# Patient Record
Sex: Male | Born: 1995
Health system: Southern US, Community
[De-identification: ages and names within clinical notes are randomized; demographics above are authoritative.]

---
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
## 2014-05-26 ENCOUNTER — Ambulatory Visit: Payer: Self-pay | Attending: Pediatrics

## 2014-06-05 ENCOUNTER — Ambulatory Visit: Payer: Medicaid Other | Admitting: Internal Medicine

## 2014-06-08 ENCOUNTER — Ambulatory Visit: Payer: Self-pay | Attending: Internal Medicine | Admitting: Internal Medicine

## 2014-06-08 ENCOUNTER — Encounter: Payer: Self-pay | Admitting: Internal Medicine

## 2014-06-08 VITALS — BP 106/67 | HR 76 | Temp 98.1°F | Resp 16 | Wt 150.0 lb

## 2014-06-08 DIAGNOSIS — E1065 Type 1 diabetes mellitus with hyperglycemia: Secondary | ICD-10-CM | POA: Insufficient documentation

## 2014-06-08 DIAGNOSIS — E089 Diabetes mellitus due to underlying condition without complications: Secondary | ICD-10-CM

## 2014-06-08 LAB — GLUCOSE, POCT (MANUAL RESULT ENTRY)
POC GLUCOSE: 347 mg/dL — AB (ref 70–99)
POC Glucose: 270 mg/dl — AB (ref 70–99)

## 2014-06-08 LAB — POCT GLYCOSYLATED HEMOGLOBIN (HGB A1C): Hemoglobin A1C: 12.1

## 2014-06-08 MED ORDER — INSULIN ASPART 100 UNIT/ML ~~LOC~~ SOLN
10.0000 [IU] | Freq: Once | SUBCUTANEOUS | Status: AC
  Administered 2014-06-08: 10 [IU] via SUBCUTANEOUS

## 2014-06-08 NOTE — Progress Notes (Signed)
Patient here to establish care Diagnosed with DM at the age of [age] Currently only on insulin

## 2014-06-08 NOTE — Patient Instructions (Signed)
Diabetes Mellitus and Food It is important for you to manage your blood sugar (glucose) level. Your blood glucose level can be greatly affected by what you eat. Eating healthier foods in the appropriate amounts throughout the day at about the same time each day will help you control your blood glucose level. It can also help slow or prevent worsening of your diabetes mellitus. Healthy eating may even help you improve the level of your blood pressure and reach or maintain a healthy weight.  HOW CAN FOOD AFFECT ME? Carbohydrates Carbohydrates affect your blood glucose level more than any other type of food. Your dietitian will help you determine how many carbohydrates to eat at each meal and teach you how to count carbohydrates. Counting carbohydrates is important to keep your blood glucose at a healthy level, especially if you are using insulin or taking certain medicines for diabetes mellitus. Alcohol Alcohol can cause sudden decreases in blood glucose (hypoglycemia), especially if you use insulin or take certain medicines for diabetes mellitus. Hypoglycemia can be a life-threatening condition. Symptoms of hypoglycemia (sleepiness, dizziness, and disorientation) are similar to symptoms of having too much alcohol.  If your health care provider has given you approval to drink alcohol, do so in moderation and use the following guidelines:  Women should not have more than one drink per day, and men should not have more than two drinks per day. One drink is equal to:  12 oz of beer.  5 oz of wine.  1 oz of hard liquor.  Do not drink on an empty stomach.  Keep yourself hydrated. Have water, diet soda, or unsweetened iced tea.  Regular soda, juice, and other mixers might contain a lot of carbohydrates and should be counted. WHAT FOODS ARE NOT RECOMMENDED? As you make food choices, it is important to remember that all foods are not the same. Some foods have fewer nutrients per serving than other  foods, even though they might have the same number of calories or carbohydrates. It is difficult to get your body what it needs when you eat foods with fewer nutrients. Examples of foods that you should avoid that are high in calories and carbohydrates but low in nutrients include:  Trans fats (most processed foods list trans fats on the Nutrition Facts label).  Regular soda.  Juice.  Candy.  Sweets, such as cake, pie, doughnuts, and cookies.  Fried foods. WHAT FOODS CAN I EAT? Have nutrient-rich foods, which will nourish your body and keep you healthy. The food you should eat also will depend on several factors, including:  The calories you need.  The medicines you take.  Your weight.  Your blood glucose level.  Your blood pressure level.  Your cholesterol level. You also should eat a variety of foods, including:  Protein, such as meat, poultry, fish, tofu, nuts, and seeds (lean animal proteins are best).  Fruits.  Vegetables.  Dairy products, such as milk, cheese, and yogurt (low fat is best).  Breads, grains, pasta, cereal, rice, and beans.  Fats such as olive oil, trans fat-free margarine, canola oil, avocado, and olives. DOES EVERYONE WITH DIABETES MELLITUS HAVE THE SAME MEAL PLAN? Because every person with diabetes mellitus is different, there is not one meal plan that works for everyone. It is very important that you meet with a dietitian who will help you create a meal plan that is just right for you. Document Released: 05/15/2005 Document Revised: 08/23/2013 Document Reviewed: 07/15/2013 [employer] Patient Information 2015 [employer]. This   information is not intended to replace advice given to you by your health care provider. Make sure you discuss any questions you have with your health care provider.  

## 2014-06-08 NOTE — Progress Notes (Signed)
Patient Demographics  Joseph EpleyMatthew Barr, is a 18 y.o. male  ZOX:[id number]  WJX:[id number]  DOB - 04/21/1996  CC:  Chief Complaint  Patient presents with  . Establish Care       HPI: Joseph EpleyMatthew Barr is a 18 y.o. male here today to establish medical care. Patient has history of 1 diabetes, as per patient when he was 18 years old he was diagnosed with diabetesient currently is taking Lantus 44 units as well as Humalog 3 times a day with meals sliding scale, as per patient he was following up with the endocrinologist in the past, his diabetes has been uncontrolled today his hemoglobin A1c still 0.1%, as per patient his fasting sugar is usually more than 200, as per patient he ate today and took his insulin still his blood sugar is elevated in the office today he is given 10 units of NovoLog, patient denies any headache dizziness chest and shortness of breath, denies any nausea vomiting any abdominal pain, as per patient he has already saw an ophthalmologist 3 months ago.  No headache, No chest pain, No abdominal pain - No Nausea, No new weakness tingling or numbness, No Cough - SOB.  No Known Allergies Past Medical History  Diagnosis Date  . Diabetes mellitus    Current Outpatient Prescriptions on File Prior to Visit  Medication Sig Dispense Refill  . insulin glargine (LANTUS) 100 UNIT/ML injection Inject 44 Units into the skin at bedtime.       . insulin lispro (HUMALOG) 100 UNIT/ML injection Inject 0-20 Units into the skin 3 (three) times daily before meals. Based on sliding scale chart       No current facility-administered medications on file prior to visit.   Family History  Problem Relation Age of Onset  . Cancer Maternal Grandfather   . Hypertension Mother    History   Social History  . Marital Status: Single    Spouse Name: N/A    Number of Children: N/A  . Years of Education: N/A   Occupational History  . Not on file.   Social History Main Topics  . Smoking status:  Never Smoker   . Smokeless tobacco: Not on file  . Alcohol Use: No  . Drug Use: Not on file  . Sexual Activity: Not on file   Other Topics Concern  . Not on file   Social History Narrative  . No narrative on file    Review of Systems: Constitutional: Negative for fever, chills, diaphoresis, activity change, appetite change and fatigue. HENT: Negative for ear pain, nosebleeds, congestion, facial swelling, rhinorrhea, neck pain, neck stiffness and ear discharge.  Eyes: Negative for pain, discharge, redness, itching and visual disturbance. Respiratory: Negative for cough, choking, chest tightness, shortness of breath, wheezing and stridor.  Cardiovascular: Negative for chest pain, palpitations and leg swelling. Gastrointestinal: Negative for abdominal distention. Genitourinary: Negative for dysuria, urgency, frequency, hematuria, flank pain, decreased urine volume, difficulty urinating and dyspareunia.  Musculoskeletal: Negative for back pain, joint swelling, arthralgia and gait problem. Neurological: Negative for dizziness, tremors, seizures, syncope, facial asymmetry, speech difficulty, weakness, light-headedness, numbness and headaches.  Hematological: Negative for adenopathy. Does not bruise/bleed easily. Psychiatric/Behavioral: Negative for hallucinations, behavioral problems, confusion, dysphoric mood, decreased concentration and agitation.    Objective:   Filed Vitals:   06/08/14 1146  BP: 106/67  Pulse: 76  Temp: 98.1 F (36.7 C)  Resp: 16    Physical Exam: Constitutional: Patient appears well-developed and well-nourished. No distress. HENT: Normocephalic,  atraumatic, External right and left ear normal. Oropharynx is clear and moist.  Eyes: Conjunctivae and EOM are normal. PERRLA, no scleral icterus. Neck: Normal ROM. Neck supple. No JVD. No tracheal deviation. No thyromegaly. CVS: RRR, S1/S2 +, no murmurs, no gallops, no carotid bruit.  Pulmonary: Effort and breath  sounds normal, no stridor, rhonchi, wheezes, rales.  Abdominal: Soft. BS +, no distension, tenderness, rebound or guarding.  Musculoskeletal: Normal range of motion. No edema and no tenderness.  Neuro: Alert. Normal reflexes, muscle tone coordination. No cranial nerve deficit. Skin: Skin is warm and dry. No rash noted. Not diaphoretic. No erythema. No pallor. Psychiatric: Normal mood and affect. Behavior, judgment, thought content normal.  Lab Results  Component Value Date   WBC 7.3 01/06/2012   HGB 15.3 01/06/2012   HCT 45.0 01/06/2012   MCV 83.5 01/06/2012   PLT 244 01/06/2012   Lab Results  Component Value Date   CREATININE 0.79 01/07/2012   BUN 18 01/07/2012   NA 135 01/07/2012   K 3.5 01/07/2012   CL 99 01/07/2012   CO2 29 01/07/2012    Lab Results  Component Value Date   HGBA1C 12.1 06/08/2014   Lipid Panel  No results found for this basename: chol, trig, hdl, cholhdl, vldl, ldlcalc       Assessment and plan:   1. Type 1 diabetes mellitus with hyperglycemia  Results for orders placed in visit on 06/08/14  GLUCOSE, POCT (MANUAL RESULT ENTRY)      Result Value Ref Range   POC Glucose 347 (*) 70 - 99 mg/dl  POCT GLYCOSYLATED HEMOGLOBIN (HGB A1C)      Result Value Ref Range   Hemoglobin A1C 12.1    GLUCOSE, POCT (MANUAL RESULT ENTRY)      Result Value Ref Range   POC Glucose 270 (*) 70 - 99 mg/dl    Diabetes is still uncontrolled, have advised patient to increase the dose of Lantus to 50 units each bedtime, continue with her Humalog with meals sliding scale, also referred to endocrinology, advise for diabetes meal planning, is given the handout, he'll come back in 2 weeks with CBG log and do fasting blood work.  - Glucose (CBG) - HgB A1c - insulin aspart (novoLOG) injection 10 Units; Inject 0.1 mLs (10 Units total) into the skin once. - CBC with Differential; Future - COMPLETE METABOLIC PANEL WITH GFR; Future - Lipid panel; Future - TSH; Future - Vit D  25 hydroxy (rtn  osteoporosis monitoring); Future - Ambulatory referral to Endocrinology  As per patient has seen ophthalmology 3 months ago       Health Maintenance Patient declined flu shot   Return in about 3 months (around 09/08/2014) for diabetes, CBG check and fasting blood work  in 2 weeks/Nurse Visit.  Doris Cheadle, MD

## 2014-06-26 ENCOUNTER — Ambulatory Visit (INDEPENDENT_AMBULATORY_CARE_PROVIDER_SITE_OTHER): Payer: Medicaid Other | Admitting: Endocrinology

## 2014-06-26 ENCOUNTER — Other Ambulatory Visit: Payer: Medicaid Other

## 2014-06-26 ENCOUNTER — Encounter: Payer: Self-pay | Admitting: Endocrinology

## 2014-06-26 VITALS — BP 126/70 | HR 102 | Temp 97.9°F | Ht 70.0 in | Wt 145.0 lb

## 2014-06-26 DIAGNOSIS — E109 Type 1 diabetes mellitus without complications: Secondary | ICD-10-CM

## 2014-06-26 MED ORDER — INSULIN GLARGINE 100 UNIT/ML SOLOSTAR PEN: 44.0000 [IU] | PEN_INJECTOR | Freq: Every day | SUBCUTANEOUS | Status: DC

## 2014-06-26 MED ORDER — GLUCOSE BLOOD VI STRP: 1.0000 | ORAL_STRIP | Freq: Four times a day (QID) | Status: DC

## 2014-06-26 MED ORDER — ACCU-CHEK AVIVA PLUS W/DEVICE KIT: 1.0000 | PACK | Freq: Once | Status: DC

## 2014-06-26 MED ORDER — INSULIN ASPART 100 UNIT/ML FLEXPEN: 20.0000 [IU] | PEN_INJECTOR | Freq: Three times a day (TID) | SUBCUTANEOUS | Status: DC

## 2014-06-26 NOTE — Patient Instructions (Signed)
good diet and exercise habits significanly improve the control of your diabetes.  please let me know if you wish to be referred to a dietician.  high blood sugar is very risky to your health.  you should see an eye doctor and dentist every year.  It is very important to get all recommended vaccinations.  controlling your blood pressure and cholesterol drastically reduces the damage diabetes does to your body.  Those who smoke should quit.  please discuss these with your doctor.  check your blood sugar 4 times a day: before the 3 meals, and at bedtime.  also check if you have symptoms of your blood sugar being too high or too low.  please keep a record of the readings and bring it to your next appointment here.  You can write it on any piece of paper.  please call us sooner if your blood sugar goes below 70, or if you have a lot of readings over 200. i have sent prescriptions to your pharmacy for strips, a meter, and novolog. Please continue the same lantus. Please increase the novolog to 20 units 3 times a day (just before each meal) Please come back for a follow-up appointment in 2 weeks.  Please see Bonita QuinLinda the same day, to discuss pump.

## 2014-06-26 NOTE — Progress Notes (Signed)
Subjective:    Patient ID: Joseph Barr, male    DOB: 09/16/1995, 18 y.o.   MRN: [id number]  HPI Hx is from pt and mother.  pt states DM was dx'ed in 2005; he has mild if any neuropathy of the lower extremities; he is unaware of any associated chronic complications; he has been on insulin since dx; pt says his diet and exercise are good; he has never had pancreatitis.  He has had only 1 episode of severe hypoglycemia--this was soon after dx.  He has DKA twice: at dx, and in 2013.   He says glycemic control is chronically poor.  He takes lantus and prn humalog.  He averages approx 10-20 units per meal.  He says he seldom misses the insulin.   Past Medical History  Diagnosis Date  . Diabetes mellitus     No past surgical history on file.  History   Social History  . Marital Status: Single    Spouse Name: N/A    Number of Children: N/A  . Years of Education: N/A   Occupational History  . Not on file.   Social History Main Topics  . Smoking status: Never Smoker   . Smokeless tobacco: Not on file  . Alcohol Use: No  . Drug Use: Not on file  . Sexual Activity: Not on file   Other Topics Concern  . Not on file   Social History Narrative  . No narrative on file    No current outpatient prescriptions on file prior to visit.   No current facility-administered medications on file prior to visit.    No Known Allergies  Family History  Problem Relation Age of Onset  . Cancer Maternal Grandfather   . Hypertension Mother   . Diabetes Neg Hx     BP 126/70  Pulse 102  Temp(Src) 97.9 F (36.6 C) (Oral)  Ht 5\' 10"  (1.778 m)  Wt 145 lb (65.772 kg)  BMI 20.81 kg/m2  SpO2 97%     Review of Systems denies weight loss, blurry vision, headache, chest pain, sob, n/v, urinary frequency, muscle cramps, excessive diaphoresis, depression, cold intolerance, rhinorrhea, and easy bruising.       Objective:   Physical Exam VS: see vs page GEN: no distress HEAD: head: no  deformity eyes: no periorbital swelling, no proptosis external nose and ears are normal mouth: no lesion seen NECK: supple, thyroid is not enlarged CHEST WALL: no deformity LUNGS:  Clear to auscultation CV: reg rate and rhythm, no murmur ABD: abdomen is soft, nontender.  no hepatosplenomegaly.  not distended.  no hernia MUSCULOSKELETAL: muscle bulk and strength are grossly normal.  no obvious joint swelling.  gait is normal and steady EXTEMITIES: no deformity.  no ulcer on the feet.  feet are of normal color and temp.  no edema PULSES: dorsalis pedis intact bilat.  no carotid bruit NEURO:  cn 2-12 grossly intact.   readily moves all 4's.  sensation is intact to touch on the feet SKIN:  Normal texture and temperature.  No rash or suspicious lesion is visible.   NODES:  None palpable at the neck PSYCH: alert, well-oriented.  Does not appear anxious nor depressed.   Lab Results  Component Value Date   HGBA1C 12.1 06/08/2014   i have reviewed the following old records: Office notes   Radiol: head CT (2013) normal.    Assessment & Plan:  DM: new to me: severe exacerbation.  He needs careful cbg monitoring.  Patient is advised the following: Patient Instructions  good diet and exercise habits significanly improve the control of your diabetes.  please let me know if you wish to be referred to a dietician.  high blood sugar is very risky to your health.  you should see an eye doctor and dentist every year.  It is very important to get all recommended vaccinations.  controlling your blood pressure and cholesterol drastically reduces the damage diabetes does to your body.  Those who smoke should quit.  please discuss these with your doctor.  check your blood sugar 4 times a day: before the 3 meals, and at bedtime.  also check if you have symptoms of your blood sugar being too high or too low.  please keep a record of the readings and bring it to your next appointment here.  You can write it  on any piece of paper.  please call us sooner if your blood sugar goes below 70, or if you have a lot of readings over 200. i have sent prescriptions to your pharmacy for strips, a meter, and novolog. Please continue the same lantus. Please increase the novolog to 20 units 3 times a day (just before each meal) Please come back for a follow-up appointment in 2 weeks.  Please see Bonita QuinLinda the same day, to discuss pump.

## 2014-06-27 ENCOUNTER — Telehealth: Payer: Self-pay

## 2014-06-27 MED ORDER — INSULIN GLARGINE 300 UNIT/ML ~~LOC~~ SOPN: 44.0000 [IU] | PEN_INJECTOR | Freq: Every day | SUBCUTANEOUS | Status: DC

## 2014-06-27 NOTE — Telephone Encounter (Signed)
Ok, i have sent a prescription in.

## 2014-06-27 NOTE — Telephone Encounter (Signed)
[facility] call inquiring if it would be okay to provide the pt will a supply of tujeo. They are currently out of Lantus.  Please advise, thanks!

## 2014-07-03 ENCOUNTER — Ambulatory Visit: Payer: Medicaid Other

## 2014-07-03 ENCOUNTER — Ambulatory Visit: Payer: Self-pay | Attending: Internal Medicine | Admitting: *Deleted

## 2014-07-03 ENCOUNTER — Ambulatory Visit: Payer: Self-pay

## 2014-07-03 DIAGNOSIS — E1065 Type 1 diabetes mellitus with hyperglycemia: Secondary | ICD-10-CM

## 2014-07-03 LAB — COMPLETE METABOLIC PANEL WITH GFR
ALT: 16 U/L (ref 0–53)
AST: 17 U/L (ref 0–37)
Albumin: 4 g/dL (ref 3.5–5.2)
Alkaline Phosphatase: 68 U/L (ref 39–117)
BILIRUBIN TOTAL: 0.5 mg/dL (ref 0.2–1.1)
BUN: 12 mg/dL (ref 6–23)
CO2: 28 meq/L (ref 19–32)
CREATININE: 0.88 mg/dL (ref 0.50–1.35)
Calcium: 8.8 mg/dL (ref 8.4–10.5)
Chloride: 100 mEq/L (ref 96–112)
GLUCOSE: 96 mg/dL (ref 70–99)
Potassium: 3.9 mEq/L (ref 3.5–5.3)
Sodium: 138 mEq/L (ref 135–145)
Total Protein: 6.2 g/dL (ref 6.0–8.3)

## 2014-07-03 LAB — LIPID PANEL
Cholesterol: 208 mg/dL — ABNORMAL HIGH (ref 0–200)
HDL: 50 mg/dL (ref >39)
LDL Cholesterol: 104 mg/dL — ABNORMAL HIGH (ref 0–99)
TRIGLYCERIDES: 272 mg/dL — AB (ref <150)
Total CHOL/HDL Ratio: 4.2 Ratio
VLDL: 54 mg/dL — ABNORMAL HIGH (ref 0–40)

## 2014-07-03 LAB — CBC WITH DIFFERENTIAL/PLATELET
Basophils Absolute: 0 10*3/uL (ref 0.0–0.1)
Basophils Relative: 0 % (ref 0–1)
EOS ABS: 0.1 10*3/uL (ref 0.0–0.7)
EOS PCT: 1 % (ref 0–5)
HCT: 39.3 % (ref 39.0–52.0)
Hemoglobin: 13.2 g/dL (ref 13.0–17.0)
LYMPHS ABS: 2 10*3/uL (ref 0.7–4.0)
Lymphocytes Relative: 37 % (ref 12–46)
MCH: 29.8 pg (ref 26.0–34.0)
MCHC: 33.6 g/dL (ref 30.0–36.0)
MCV: 88.7 fL (ref 78.0–100.0)
MONOS PCT: 10 % (ref 3–12)
Monocytes Absolute: 0.5 10*3/uL (ref 0.1–1.0)
Neutro Abs: 2.8 10*3/uL (ref 1.7–7.7)
Neutrophils Relative %: 52 % (ref 43–77)
PLATELETS: 305 10*3/uL (ref 150–400)
RBC: 4.43 MIL/uL (ref 4.22–5.81)
RDW: 14.3 % (ref 11.5–15.5)
WBC: 5.4 10*3/uL (ref 4.0–10.5)

## 2014-07-03 LAB — TSH: TSH: 1.293 u[IU]/mL (ref 0.350–4.500)

## 2014-07-03 LAB — POCT CBG (FASTING - GLUCOSE)-MANUAL ENTRY: Glucose Fasting, POC: 92 mg/dL (ref 70–99)

## 2014-07-03 NOTE — Progress Notes (Unsigned)
Patient presents with mom for fasting CBG and Labs Patient did not bring log of CBG readings.  States ranging 72 to high 300s; mostly mid to high 200s Asked patient to bring record of home CBGs to all future appts States he has been to endocrinologist and had med changes: Now taking novolog insulin 20 units TID and Toujeo 44 units qHS  Fasting CBG 92 Patient states he is aware of s/sx of hypoglycemia and actions to take  Patient reports he has another appt with endocrinologist on 07/12/14

## 2014-07-04 LAB — VITAMIN D 25 HYDROXY (VIT D DEFICIENCY, FRACTURES): Vit D, 25-Hydroxy: 32 ng/mL (ref 30–89)

## 2014-07-05 ENCOUNTER — Telehealth: Payer: Self-pay | Admitting: Emergency Medicine

## 2014-07-05 MED ORDER — ATORVASTATIN CALCIUM 20 MG PO TABS: 20.0000 mg | ORAL_TABLET | Freq: Every day | ORAL | Status: DC

## 2014-07-05 NOTE — Telephone Encounter (Signed)
Attempted to reach pt with lab results.number listed busy Will send medication Lipitor 20 mg tab to pharmacy

## 2014-07-05 NOTE — Telephone Encounter (Signed)
-----   Message from Deepak Advani, MD sent at 07/04/2014 11:44 AM EST ----- Call and let the patient know the blood work is normal except for hyperlipidemia, since patient is diabetic will start on statins, advise patient to start taking Lipitor 20 mg daily.  

## 2014-07-10 ENCOUNTER — Ambulatory Visit: Payer: Medicaid Other | Admitting: Endocrinology

## 2014-07-11 ENCOUNTER — Ambulatory Visit (INDEPENDENT_AMBULATORY_CARE_PROVIDER_SITE_OTHER): Payer: Self-pay | Admitting: Endocrinology

## 2014-07-11 ENCOUNTER — Encounter: Payer: Self-pay | Admitting: Endocrinology

## 2014-07-11 ENCOUNTER — Encounter: Payer: Medicaid Other | Attending: Endocrinology | Admitting: Nutrition

## 2014-07-11 ENCOUNTER — Other Ambulatory Visit: Payer: Self-pay

## 2014-07-11 VITALS — BP 118/70 | HR 72 | Temp 98.0°F | Wt 140.0 lb

## 2014-07-11 DIAGNOSIS — Z794 Long term (current) use of insulin: Secondary | ICD-10-CM | POA: Insufficient documentation

## 2014-07-11 DIAGNOSIS — E1010 Type 1 diabetes mellitus with ketoacidosis without coma: Secondary | ICD-10-CM | POA: Insufficient documentation

## 2014-07-11 DIAGNOSIS — Z713 Dietary counseling and surveillance: Secondary | ICD-10-CM | POA: Insufficient documentation

## 2014-07-11 MED ORDER — INSULIN ASPART 100 UNIT/ML FLEXPEN: 20.0000 [IU] | PEN_INJECTOR | Freq: Three times a day (TID) | SUBCUTANEOUS | Status: DC

## 2014-07-11 MED ORDER — INSULIN GLARGINE 300 UNIT/ML ~~LOC~~ SOPN: 44.0000 [IU] | PEN_INJECTOR | Freq: Every day | SUBCUTANEOUS | Status: DC

## 2014-07-11 NOTE — Progress Notes (Signed)
This patient is here today with his mother.  He wants very much to go on an insulin pump.  He has a [employer] Paradigm 723 insulin pump that has never been worn.  He reports being on a [place] pump, but said that he was not well controlled on it, and ended up in the hospital "several times".    Medication:  Started on Toujeo last week, and said that his FBS today was 43.  He is taking 46u at HS.                    He is also on Novolog 20u ac meals.  Diet:  Mostly balanced meals.  Pt. Knows how to carb count, but is not doing it.  Says he eats health meals.  He wakes up at 8-9AM, eats his first meal around 12Noon.  Sandwich, few chips or pretzels, apple or apple sauce, and diet drink or water.  He dose this most days.  He will snack around 2:30 on 1 or 2 packages of Nabs, and then says his acS blood sugars are ususally around 180-200.  He takes no extra insulin for snacks.  Supper is 6-7 PM: 6-7 ounces of protein-never fried, 3-4 servings of Vegetables-one is non starchy.  He will have a bedtime snack of popcorn, or chips and diet soda, with no extra insulin.    Most meals are balanced, and portion sizes are consistent with each of the 3 meals day to day.  Says he will increase the dose of Novolog when his blood sugars are high, per sliding scale the doctor gave him.  He also will increase/decrease this dose when eating more, or are active after the meal.    He treats his low blood sugars appropriately with regular soda--1/2 to 1 cup, or 3 glucose tablets.  He was given a sample of glucose tablets to keep at his bedside.   His mother is wanting to know if their orange card will pay for the pump supplies.  I told her to call the 800 number on the back of the pump, and gave her the [employer] Rep[employer]s name and number. He reports testing his blood sugars at least 4X/day.  Meter was given to be downloaded for Dr. Everardo AllEllison.  I believe he will be a good candidate for a pump,  if he can get coverage for the  supplies.

## 2014-07-11 NOTE — Progress Notes (Signed)
   Subjective:    Patient ID: Joseph Barr, male    DOB: [birth year], 18 y.o.   MRN: [id number]  HPI  Pt returns for f/u of diabetes mellitus: DM type: 1 Dx'ed: 4098 Complications: none Therapy: insulin since dx DKA: twice: at dx, and in 2013 Severe hypoglycemia: 1 episode--was soon after dx Pancreatitis: never Other:  Interval history: he brings a record of his cbg's which i have reviewed today.  It is scanned into the record.  He has had several episodes of mild hypoglycemia.  These happen in the fasting state.  It is highest at HS.   Past Medical History  Diagnosis Date  . Diabetes mellitus     No past surgical history on file.  History   Social History  . Marital Status: Single    Spouse Name: N/A    Number of Children: N/A  . Years of Education: N/A   Occupational History  . Not on file.   Social History Main Topics  . Smoking status: Never Smoker   . Smokeless tobacco: Not on file  . Alcohol Use: No  . Drug Use: Not on file  . Sexual Activity: Not on file   Other Topics Concern  . Not on file   Social History Narrative    Current Outpatient Prescriptions on File Prior to Visit  Medication Sig Dispense Refill  . atorvastatin (LIPITOR) 20 MG tablet Take 1 tablet (20 mg total) by mouth daily. 90 tablet 3  . Blood Glucose Monitoring Suppl (ACCU-CHEK AVIVA PLUS) W/DEVICE KIT 1 Device by Does not apply route once. 1 kit 0  . glucose blood (ACCU-CHEK AVIVA) test strip 1 each by Other route 4 (four) times daily. And lancets 250.03 120 each 12   No current facility-administered medications on file prior to visit.    No Known Allergies  Family History  Problem Relation Age of Onset  . Cancer Maternal Grandfather   . Hypertension Mother   . Diabetes Neg Hx     BP 118/70 mmHg  Pulse 72  Temp(Src) 98 F (36.7 C) (Oral)  Wt 140 lb (63.504 kg)  SpO2 97%  Review of Systems Denies LOC and n/v.     Objective:   Physical Exam VITAL SIGNS:  See vs  page GENERAL: no distress SKIN:  Insulin injection sites at the anterior abdomen are normal     Assessment & Plan:  DM: The pattern of his cbg's indicates he needs some adjustment in his therapy.  Patient is advised the following: Patient Instructions  check your blood sugar 4 times a day: before the 3 meals, and at bedtime.  also check if you have symptoms of your blood sugar being too high or too low.  please keep a record of the readings and bring it to your next appointment here.  You can write it on any piece of paper.  please call us sooner if your blood sugar goes below 70, or if you have a lot of readings over 200. Please reduce the toujeo to 40 units at bedtime, and: Please increase the novolog to 3 times a day (just before each meal): 20-20-24 units Please come back for a follow-up appointment in 3 months.   i would be happy to help you get started on a pump, if you get a way to pay for supplies.

## 2014-07-11 NOTE — Patient Instructions (Signed)
Call [employer] to verify insurance coverage of supplies

## 2014-07-11 NOTE — Patient Instructions (Addendum)
check your blood sugar 4 times a day: before the 3 meals, and at bedtime.  also check if you have symptoms of your blood sugar being too high or too low.  please keep a record of the readings and bring it to your next appointment here.  You can write it on any piece of paper.  please call us sooner if your blood sugar goes below 70, or if you have a lot of readings over 200. Please reduce the toujeo to 40 units at bedtime, and: Please increase the novolog to 3 times a day (just before each meal): 20-20-24 units Please come back for a follow-up appointment in 3 months.   i would be happy to help you get started on a pump, if you get a way to pay for supplies.

## 2014-07-13 ENCOUNTER — Telehealth: Payer: Self-pay

## 2014-07-13 NOTE — Telephone Encounter (Signed)
-----   Message from Doris Cheadleeepak Advani, MD sent at 07/04/2014 11:44 AM EST ----- Call and let the patient know the blood work is normal except for hyperlipidemia, since patient is diabetic will start on statins, advise patient to start taking Lipitor 20 mg daily.

## 2014-07-13 NOTE — Telephone Encounter (Signed)
Attempted to reach patient  Patient not available Unable to leave message-no voice mail

## 2014-10-11 ENCOUNTER — Ambulatory Visit: Payer: Self-pay | Admitting: Endocrinology

## 2014-10-25 ENCOUNTER — Telehealth: Payer: Self-pay

## 2014-10-25 NOTE — Telephone Encounter (Signed)
Pt called stating for the past two days he has been experiencing arm numbness. Pt's sister is a Engineer, civil (consulting)nurse and she advised him to contact our office because he may be having circulatory issues due to his Diabetes. Verbally spoke with Dr. Everardo AllEllison about pt's concerns and MD advised for pt to contact his PCP. Pt notified to contact his PCP.

## 2014-10-30 ENCOUNTER — Ambulatory Visit (INDEPENDENT_AMBULATORY_CARE_PROVIDER_SITE_OTHER): Payer: Self-pay | Admitting: Endocrinology

## 2014-10-30 ENCOUNTER — Encounter: Payer: Self-pay | Admitting: Endocrinology

## 2014-10-30 VITALS — BP 118/70 | HR 81 | Temp 98.2°F | Ht 70.0 in | Wt 140.0 lb

## 2014-10-30 DIAGNOSIS — E1010 Type 1 diabetes mellitus with ketoacidosis without coma: Secondary | ICD-10-CM

## 2014-10-30 NOTE — Progress Notes (Signed)
Subjective:    Patient ID: Joseph Barr, male    DOB: 19-Apr-1996, 19 y.o.   MRN: [id number]  HPI Pt returns for f/u of diabetes mellitus: DM type: 1 Dx'ed: 4481 Complications: none Therapy: insulin since dx DKA: twice: at dx, and in 2013 Severe hypoglycemia: 1 episode--was soon after dx Pancreatitis: never Other: he takes multiple daily injections Interval history: he brings a record of his cbg's which i have reviewed today.  It varies from 69-400, but most are in the 200's.  He has mild hypoglycemia approx 2-3 times per month.  This usually happens in the middle of the night Past Medical History  Diagnosis Date  . Diabetes mellitus     No past surgical history on file.  History   Social History  . Marital Status: Single    Spouse Name: N/A  . Number of Children: N/A  . Years of Education: N/A   Occupational History  . Not on file.   Social History Main Topics  . Smoking status: Never Smoker   . Smokeless tobacco: Not on file  . Alcohol Use: No  . Drug Use: Not on file  . Sexual Activity: Not on file   Other Topics Concern  . Not on file   Social History Narrative    Current Outpatient Prescriptions on File Prior to Visit  Medication Sig Dispense Refill  . Blood Glucose Monitoring Suppl (ACCU-CHEK AVIVA PLUS) W/DEVICE KIT 1 Device by Does not apply route once. 1 kit 0  . glucose blood (ACCU-CHEK AVIVA) test strip 1 each by Other route 4 (four) times daily. And lancets 250.03 120 each 12  . Insulin Aspart (NOVOLOG FLEXPEN [place]) Inject into the skin. 3 times a day (just before each meal): 20-20-24 units, and pen needles 4/day    . Insulin Glargine (TOUJEO SOLOSTAR ) Inject 35 Units into the skin at bedtime.     Marland Kitchen atorvastatin (LIPITOR) 20 MG tablet Take 1 tablet (20 mg total) by mouth daily. (Patient not taking: Reported on 10/30/2014) 90 tablet 3   No current facility-administered medications on file prior to visit.    No Known Allergies  Family History    Problem Relation Age of Onset  . Cancer Maternal Grandfather   . Hypertension Mother   . Diabetes Neg Hx     BP 118/70 mmHg  Pulse 81  Temp(Src) 98.2 F (36.8 C) (Oral)  Ht _0  (1.778 m)  Wt 140 lb (63.504 kg)  BMI 20.09 kg/m2  SpO2 98%  Review of Systems Denies LOC and weight change    Objective:   Physical Exam VITAL SIGNS:  See vs page GENERAL: no distress Pulses: dorsalis pedis intact bilat.   MSK: no deformity of the feet CV: no leg edema.   Skin:  no ulcer on the feet.  normal color and temp on the feet. Neuro: sensation is intact to touch on the feet.     (pt did not get a1c drawn)    Assessment & Plan:  DM: uncertain control.  He may need a simpler regimen.   Noncompliance with lab drawing. Side-effect of rx: hypoglycemia.    Patient is advised the following: Patient Instructions  check your blood sugar 4 times a day: before the 3 meals, and at bedtime.  also check if you have symptoms of your blood sugar being too high or too low.  please keep a record of the readings and bring it to your next appointment here.  You can  write it on any piece of paper.  please call us sooner if your blood sugar goes below 70, or if you have a lot of readings over 200. Please reduce the toujeo to 35 units at bedtime, and: continue the same novolog, but take an 4 extra units whenever it is over 200.   blood tests are being requested for you today.  We'll let you know about the results. Please come back for a follow-up appointment in 3 months.

## 2014-10-30 NOTE — Patient Instructions (Addendum)
check your blood sugar 4 times a day: before the 3 meals, and at bedtime.  also check if you have symptoms of your blood sugar being too high or too low.  please keep a record of the readings and bring it to your next appointment here.  You can write it on any piece of paper.  please call us sooner if your blood sugar goes below 70, or if you have a lot of readings over 200. Please reduce the toujeo to 35 units at bedtime, and: continue the same novolog, but take an 4 extra units whenever it is over 200.   blood tests are being requested for you today.  We'll let you know about the results. Please come back for a follow-up appointment in 3 months.

## 2014-11-14 ENCOUNTER — Other Ambulatory Visit: Payer: Self-pay

## 2014-11-14 MED ORDER — INSULIN PEN NEEDLE 32G X 4 MM MISC
Status: DC
Start: 2014-11-14 — End: 2017-06-11

## 2014-12-06 ENCOUNTER — Ambulatory Visit: Payer: Self-pay | Attending: Internal Medicine

## 2014-12-06 ENCOUNTER — Encounter: Payer: Self-pay | Admitting: Family Medicine

## 2014-12-06 ENCOUNTER — Telehealth: Payer: Self-pay | Admitting: Family Medicine

## 2014-12-06 ENCOUNTER — Ambulatory Visit (HOSPITAL_BASED_OUTPATIENT_CLINIC_OR_DEPARTMENT_OTHER): Payer: Self-pay | Admitting: Family Medicine

## 2014-12-06 VITALS — BP 107/73 | HR 90 | Temp 98.0°F | Resp 16 | Ht 72.0 in | Wt 136.2 lb

## 2014-12-06 DIAGNOSIS — E1010 Type 1 diabetes mellitus with ketoacidosis without coma: Secondary | ICD-10-CM

## 2014-12-06 DIAGNOSIS — Z794 Long term (current) use of insulin: Secondary | ICD-10-CM | POA: Insufficient documentation

## 2014-12-06 DIAGNOSIS — R112 Nausea with vomiting, unspecified: Secondary | ICD-10-CM

## 2014-12-06 DIAGNOSIS — R51 Headache: Secondary | ICD-10-CM | POA: Insufficient documentation

## 2014-12-06 DIAGNOSIS — R109 Unspecified abdominal pain: Secondary | ICD-10-CM | POA: Insufficient documentation

## 2014-12-06 DIAGNOSIS — E1065 Type 1 diabetes mellitus with hyperglycemia: Secondary | ICD-10-CM | POA: Insufficient documentation

## 2014-12-06 LAB — POCT URINALYSIS DIPSTICK
Bilirubin, UA: NEGATIVE
Blood, UA: NEGATIVE
Ketones, UA: NEGATIVE
Leukocytes, UA: NEGATIVE
NITRITE UA: NEGATIVE
PROTEIN UA: 30
SPEC GRAV UA: 1.015
UROBILINOGEN UA: 0.2
pH, UA: 6.5

## 2014-12-06 LAB — GLUCOSE, POCT (MANUAL RESULT ENTRY): POC GLUCOSE: 312 mg/dL — AB (ref 70–99)

## 2014-12-06 MED ORDER — ONDANSETRON HCL 4 MG PO TABS: 4.0000 mg | ORAL_TABLET | Freq: Three times a day (TID) | ORAL | Status: DC | PRN

## 2014-12-06 NOTE — Patient Instructions (Signed)
Type 1 Diabetes Mellitus Type 1 diabetes mellitus, often simply referred to as diabetes, is a long-term (chronic) disease. It occurs when the islet cells in the pancreas that make insulin (a hormone) are destroyed and can no longer make insulin. Insulin is needed to move sugars from food into the tissue cells. The tissue cells use the sugars for energy. In people with type 1 diabetes, the sugars build up in the blood instead of going into the tissue cells. As a result, high blood sugar (hyperglycemia) develops. Without insulin, the body breaks down fat cells for the needed energy. This breakdown of fat cells produces acid chemicals (ketones), which increases the acid levels in the body. The effect of either high ketone or high sugar (glucose) levels can be life-threatening.  Type 1 diabetes was also previously called juvenile diabetes. It most often occurs before the age of 30, but it can occur at any age. RISK FACTORS A person is predisposed to developing type 1 diabetes if someone in his or her family has the disease and is exposed to certain additional environmental triggers.  SYMPTOMS  Symptoms of type 1 diabetes may develop gradually over days to weeks or suddenly. The symptoms occur due to hyperglycemia. The symptoms can include:   Increased thirst (polydipsia).  Increased urination (polyuria).  Increased urination during the night (nocturia).  Weight loss. This weight loss may be rapid.  Frequent, recurring infections.  Tiredness (fatigue).  Weakness.  Vision changes, such as blurred vision.  Fruity smell to your breath.  Abdominal pain.  Nausea or vomiting. DIAGNOSIS  Type 1 diabetes is diagnosed when symptoms of diabetes are present and when blood glucose levels are increased. Your blood glucose level may be checked by one or more of the following blood tests:  A fasting blood glucose test. You will not be allowed to eat for at least 8 hours before a blood sample is  taken.  A random blood glucose test. Your blood glucose is checked at any time of the day regardless of when you ate.  A hemoglobin A1c blood glucose test. A hemoglobin A1c test provides information about blood glucose control over the previous 3 months. TREATMENT  Although type 1 diabetes cannot be prevented, it can be managed with insulin, diet, and exercise.  You will need to take insulin daily to keep blood glucose in the desired range.  You will need to match insulin dosing with exercise and healthy food choices. The treatment goal is to maintain the before-meal blood sugar (preprandial glucose) level at 70-130 mg/dL.  HOME CARE INSTRUCTIONS   Have your hemoglobin A1c level checked twice a year.  Perform daily blood glucose monitoring as directed by your health care provider.  Monitor urine ketones when you are ill and as directed by your health care provider.  Take your insulin as directed by your health care provider to maintain your blood glucose level in the desired range.  Never run out of insulin. It is needed every day.  Adjust insulin based on your intake of carbohydrates. Carbohydrates can raise blood glucose levels but need to be included in your diet. Carbohydrates provide vitamins, minerals, and fiber, which are an essential part of a healthy diet. Carbohydrates are found in fruits, vegetables, whole grains, dairy products, legumes, and foods containing added sugars.  Eat healthy foods. Alternate 3 meals with 3 snacks.  Maintain a healthy weight.  Carry a medical alert card or wear your medical alert jewelry.  Carry a 15-gram carbohydrate snack   with you at all times to treat low blood glucose (hypoglycemia). Some examples of 15-gram carbohydrate snacks include:  Glucose tablets, 3 or 4.  Glucose gel, 15-gram tube.  Raisins, 2 tablespoons (24 grams).  Jelly beans, 6.  Animal crackers, 8.  Fruit juice, regular soda, or low-fat milk, 4 ounces (120  mL).  Gummy treats, 9.  Recognize hypoglycemia. Hypoglycemia occurs with blood glucose levels of 70 mg/dL and below. The risk for hypoglycemia increases when fasting or skipping meals, during or after intense exercise, and during sleep. Hypoglycemia symptoms can include:  Tremors or shakes.  Decreased ability to concentrate.  Sweating.  Increased heart rate.  Headache.  Dry mouth.  Hunger.  Irritability.  Anxiety.  Restless sleep.  Altered speech or coordination.  Confusion.  Treat hypoglycemia promptly. If you are alert and able to safely swallow, follow the 15:15 rule:  Take 15-20 grams of rapid-acting glucose or carbohydrate. Rapid-acting options include glucose gel, glucose tablets, or 4 ounces (120 mL) of fruit juice, regular soda, or low-fat milk.  Check your blood glucose level 15 minutes after taking the glucose.  Take 15-20 grams more of glucose if the repeat blood glucose level is still 70 mg/dL or below.  Eat a meal or snack within 1 hour once blood glucose levels return to normal.  Be alert to polyuria and polydipsia, which are early signs of hyperglycemia. An early awareness of hyperglycemia allows for prompt treatment. Treat hyperglycemia as directed by your health care provider.  Exercise regularly as directed by your health care provider. This includes:  Performing resistance training twice a week such as push-ups, sit-ups, lifting weights, or using resistance bands.  Performing 150 minutes of cardio exercises each week such as walking, running, or playing sports.  Staying active and spending no more than 90 minutes at one time being inactive.  Adjust your insulin dosing and food intake as needed if you start a new exercise or sport.  Follow your sick-day plan at any time you are unable to eat or drink as usual.   Do not use any tobacco products including cigarettes, chewing tobacco, or electronic cigarettes. If you need help quitting, ask your  health care provider.  Limit alcohol intake to no more than 1 drink per day for nonpregnant women and 2 drinks per day for men. You should drink alcohol only when you are also eating food. Talk with your health care provider about whether alcohol is safe for you. Tell your health care provider if you drink alcohol several times a week.  Keep all follow-up visits as directed by your health care provider.  Schedule an eye exam within 5 years of diagnosis and then annually.  Perform daily skin and foot care. Examine your skin and feet daily for cuts, bruises, redness, nail problems, bleeding, blisters, or sores. A foot exam by a health care provider should be done annually.  Brush your teeth and gums at least twice a day and floss at least once a day. Follow up with your dentist regularly.  Share your diabetes management plan with your workplace or school.  Stay up-to-date with immunizations. It is recommended that people with diabetes who are over 65 years old get the pneumonia vaccine. In some cases, two separate shots may be given. Ask your health care provider if your pneumonia vaccination is up-to-date.  Learn to manage stress.  Obtain ongoing diabetes education and support as needed.  Participate in or seek rehabilitation as needed to maintain or improve independence   and quality of life. Request a physical or occupational therapy referral if you are having foot or hand numbness, or difficulties with grooming, dressing, eating, or physical activity. SEEK MEDICAL CARE IF:   You are unable to eat food or drink fluids for more than 6 hours.  You have nausea and vomiting for more than 6 hours.  Your blood glucose level is over 240 mg/dL.  There is a change in mental status.  You develop an additional serious illness.  You have diarrhea for more than 6 hours.  You have been sick or have had a fever for a couple of days and are not getting better.  You have pain during any physical  activity. SEEK IMMEDIATE MEDICAL CARE IF:  You have difficulty breathing.  You have moderate to large ketone levels. MAKE SURE YOU:  Understand these instructions.  Will watch your condition.  Will get help right away if you are not doing well or get worse. Document Released: 08/15/2000 Document Revised: 01/02/2014 Document Reviewed: 03/16/2012 Exit[employer] Patient Information 2015 [employer]. This information is not intended to replace advice given to you by your health care provider. Make sure you discuss any questions you have with your health care provider.  

## 2014-12-06 NOTE — Progress Notes (Signed)
Patient complaining of vomiting. Episode started at 3am this morning, vomiting x 5 hours. Same symptoms happened Monday. Patient complains of "migraines". Head and abdominal pain currently at 8. Head is constant pain, abdominal pain "comes in waves".

## 2014-12-06 NOTE — Telephone Encounter (Signed)
Called patient an hour ago and Mom stated he was in bed but vomiting had ceased and he was able to keep his food down and take some insulin; will speak to the patient in the morning.

## 2014-12-06 NOTE — Progress Notes (Signed)
   Subjective:    Patient ID: Joseph Barr, male    DOB: 11/05/1995, 19 y.o.   MRN: [id number]  HPI  C/o vomiting and abdominal cramping which comes in waves with some stabbing in his abdomen which started this morning at 3am.  Has not eaten any fast food recently, no diarrhea or fever. He also has a headache which he describes as a migraine.  He denies blurry vision; has been unable to eat anything today; CBG is 312 in the clinic.he oly took 8 units of insulin in the early hours of the morning.  PMH is notable for type 1 DM which is managed by [facility] And last Hba1c was 12.1 in 06/2014.       Review of Systems Constitutional: Positive for appetite change. Negative for fever and fatigue.  HENT: Negative for congestion.  Eyes: Negative for visual disturbance.  Respiratory: Negative for cough and shortness of breath.  Cardiovascular: Negative for chest pain.  Gastrointestinal:  See HPI  Genitourinary: Negative.  Neurological: Positive for headaches.  Psychiatric/Behavioral: Positive for dysphoric mood. Negative for suicidal ideas.   PHQ 9= 17, GAD 7=19; behavioral health called to see patient.       Objective:   Physical Exam  Constitutional: He appears well-developed.  Ill looking  HENT:  Head: Normocephalic.  Eyes: Pupils are equal, round, and reactive to light.  Neck: Normal range of motion.  Pulmonary/Chest: Effort normal and breath sounds normal. No respiratory distress. He has no wheezes. He has no rales. He exhibits no tenderness.  Abdominal: Soft. Bowel sounds are normal. He exhibits no distension and no mass. There is tenderness. There is no guarding.  Psychiatric: He has a normal mood and affect.         Assessment & Plan:  Patient with type 1 DM presenting with vomiting and hyperglycemia.  Nausea and vomiting: I have placed him on an antiemetic. Cannot exclude gastroenteritis I have educated him on supportive measures and increasing oral  fluids; if symptoms persist he would need to be evaluated at the ED for impending DKA. Pain is also possible etiology and he has been advised to take NSAIDs once he is able to tolerate orally.  Type I DM Uncontrolled with a hemoglobin A1c of 12.1 Urinalysis is negative for ketones; his CBGs elevated but he hasn't had anything by mouth and so I will hold off on giving him insulin and clinic but have advised him that once he takes antiemetic should proceed with oral intake and then his insulin.

## 2014-12-07 NOTE — Progress Notes (Signed)
Quick Note:  Labs addressed at office as well as medications and patient was made aware. ______ 

## 2014-12-18 ENCOUNTER — Telehealth: Payer: Self-pay | Admitting: Clinical

## 2014-12-18 NOTE — Telephone Encounter (Signed)
F/U call- pt states that he is "sleeping better and feeling better" since starting school. He is using treatment strategy of reading before bed, by reading his school materials(cookbooks) prior to sleep. Pt [facility] visit at [facility] to further his treatment plan.

## 2015-01-30 ENCOUNTER — Ambulatory Visit: Payer: Self-pay | Admitting: Endocrinology

## 2015-02-09 ENCOUNTER — Ambulatory Visit: Payer: Self-pay | Admitting: Endocrinology

## 2015-03-06 ENCOUNTER — Ambulatory Visit: Payer: Self-pay | Admitting: Endocrinology

## 2015-04-11 ENCOUNTER — Ambulatory Visit: Payer: Self-pay | Admitting: Endocrinology

## 2015-04-24 ENCOUNTER — Emergency Department (HOSPITAL_COMMUNITY): Payer: Self-pay

## 2015-04-24 ENCOUNTER — Encounter (HOSPITAL_COMMUNITY): Payer: Self-pay | Admitting: *Deleted

## 2015-04-24 ENCOUNTER — Inpatient Hospital Stay (HOSPITAL_COMMUNITY)
Admission: EM | Admit: 2015-04-24 | Discharge: 2015-04-24 | DRG: 638 | Discharge status: Against Medical Advice | Payer: Self-pay | Attending: Family Medicine | Admitting: Family Medicine

## 2015-04-24 DIAGNOSIS — E1110 Type 2 diabetes mellitus with ketoacidosis without coma: Secondary | ICD-10-CM | POA: Diagnosis present

## 2015-04-24 DIAGNOSIS — E785 Hyperlipidemia, unspecified: Secondary | ICD-10-CM | POA: Diagnosis present

## 2015-04-24 DIAGNOSIS — E860 Dehydration: Secondary | ICD-10-CM | POA: Diagnosis present

## 2015-04-24 DIAGNOSIS — F17210 Nicotine dependence, cigarettes, uncomplicated: Secondary | ICD-10-CM | POA: Diagnosis present

## 2015-04-24 DIAGNOSIS — F1290 Cannabis use, unspecified, uncomplicated: Secondary | ICD-10-CM | POA: Diagnosis present

## 2015-04-24 DIAGNOSIS — Z79899 Other long term (current) drug therapy: Secondary | ICD-10-CM

## 2015-04-24 DIAGNOSIS — E1010 Type 1 diabetes mellitus with ketoacidosis without coma: Principal | ICD-10-CM | POA: Diagnosis present

## 2015-04-24 DIAGNOSIS — N179 Acute kidney failure, unspecified: Secondary | ICD-10-CM | POA: Diagnosis present

## 2015-04-24 DIAGNOSIS — R0789 Other chest pain: Secondary | ICD-10-CM | POA: Diagnosis present

## 2015-04-24 LAB — RAPID URINE DRUG SCREEN, HOSP PERFORMED
Amphetamines: NOT DETECTED
Barbiturates: NOT DETECTED
Benzodiazepines: NOT DETECTED
Cocaine: NOT DETECTED
Opiates: NOT DETECTED
Tetrahydrocannabinol: POSITIVE — AB

## 2015-04-24 LAB — CBC WITH DIFFERENTIAL/PLATELET
Basophils Absolute: 0.1 10*3/uL (ref 0.0–0.1)
Basophils Relative: 1 % (ref 0–1)
EOS ABS: 0 10*3/uL (ref 0.0–0.7)
Eosinophils Relative: 0 % (ref 0–5)
HCT: 40.7 % (ref 39.0–52.0)
HEMOGLOBIN: 14.6 g/dL (ref 13.0–17.0)
LYMPHS ABS: 1.8 10*3/uL (ref 0.7–4.0)
LYMPHS PCT: 24 % (ref 12–46)
MCH: 31.5 pg (ref 26.0–34.0)
MCHC: 35.9 g/dL (ref 30.0–36.0)
MCV: 87.9 fL (ref 78.0–100.0)
Monocytes Absolute: 0.6 10*3/uL (ref 0.1–1.0)
Monocytes Relative: 8 % (ref 3–12)
NEUTROS PCT: 67 % (ref 43–77)
Neutro Abs: 5.1 10*3/uL (ref 1.7–7.7)
Platelets: 308 10*3/uL (ref 150–400)
RBC: 4.63 MIL/uL (ref 4.22–5.81)
RDW: 12.2 % (ref 11.5–15.5)
WBC: 7.6 10*3/uL (ref 4.0–10.5)

## 2015-04-24 LAB — I-STAT VENOUS BLOOD GAS, ED
ACID-BASE DEFICIT: 16 mmol/L — AB (ref 0.0–2.0)
BICARBONATE: 11.5 meq/L — AB (ref 20.0–24.0)
O2 SAT: 95 %
PO2 VEN: 92 mmHg — AB (ref 30.0–45.0)
TCO2: 12 mmol/L (ref 0–100)
pCO2, Ven: 32.1 mmHg — ABNORMAL LOW (ref 45.0–50.0)
pH, Ven: 7.161 — CL (ref 7.250–7.300)

## 2015-04-24 LAB — I-STAT CHEM 8, ED
BUN: 27 mg/dL — ABNORMAL HIGH (ref 6–20)
Calcium, Ion: 1.15 mmol/L (ref 1.12–1.23)
Chloride: 98 mmol/L — ABNORMAL LOW (ref 101–111)
Creatinine, Ser: 0.9 mg/dL (ref 0.61–1.24)
Glucose, Bld: >700 mg/dL (ref 65–99)
HCT: 47 % (ref 39.0–52.0)
Hemoglobin: 16 g/dL (ref 13.0–17.0)
Potassium: 5 mmol/L (ref 3.5–5.1)
Sodium: 127 mmol/L — ABNORMAL LOW (ref 135–145)
TCO2: 11 mmol/L (ref 0–100)

## 2015-04-24 LAB — URINALYSIS, ROUTINE W REFLEX MICROSCOPIC
Bilirubin Urine: NEGATIVE
Hgb urine dipstick: NEGATIVE
LEUKOCYTES UA: NEGATIVE
NITRITE: NEGATIVE
PH: 5 (ref 5.0–8.0)
Protein, ur: NEGATIVE mg/dL
Specific Gravity, Urine: 1.031 — ABNORMAL HIGH (ref 1.005–1.030)
Urobilinogen, UA: 0.2 mg/dL (ref 0.0–1.0)

## 2015-04-24 LAB — BASIC METABOLIC PANEL WITH GFR
Anion gap: 17 — ABNORMAL HIGH (ref 5–15)
BUN: 17 mg/dL (ref 6–20)
CO2: 11 mmol/L — ABNORMAL LOW (ref 22–32)
Calcium: 8.5 mg/dL — ABNORMAL LOW (ref 8.9–10.3)
Chloride: 106 mmol/L (ref 101–111)
Creatinine, Ser: 1.67 mg/dL — ABNORMAL HIGH (ref 0.61–1.24)
GFR calc Af Amer: >60 mL/min (ref >60)
GFR calc non Af Amer: 58 mL/min — ABNORMAL LOW (ref >60)
Glucose, Bld: 214 mg/dL — ABNORMAL HIGH (ref 65–99)
Potassium: 4.3 mmol/L (ref 3.5–5.1)
Sodium: 134 mmol/L — ABNORMAL LOW (ref 135–145)

## 2015-04-24 LAB — COMPREHENSIVE METABOLIC PANEL
ALK PHOS: 78 U/L (ref 38–126)
ALT: 37 U/L (ref 17–63)
AST: 35 U/L (ref 15–41)
Albumin: 4.2 g/dL (ref 3.5–5.0)
Anion gap: 29 — ABNORMAL HIGH (ref 5–15)
BUN: 21 mg/dL — AB (ref 6–20)
CALCIUM: 9.4 mg/dL (ref 8.9–10.3)
CO2: 10 mmol/L — AB (ref 22–32)
CREATININE: 1.79 mg/dL — AB (ref 0.61–1.24)
Chloride: 91 mmol/L — ABNORMAL LOW (ref 101–111)
GFR calc non Af Amer: 53 mL/min — ABNORMAL LOW (ref >60)
Glucose, Bld: 698 mg/dL (ref 65–99)
Potassium: 4.9 mmol/L (ref 3.5–5.1)
SODIUM: 130 mmol/L — AB (ref 135–145)
Total Bilirubin: 2.1 mg/dL — ABNORMAL HIGH (ref 0.3–1.2)
Total Protein: 7.2 g/dL (ref 6.5–8.1)

## 2015-04-24 LAB — CBG MONITORING, ED
Glucose-Capillary: >600 mg/dL (ref 65–99)
Glucose-Capillary: 530 mg/dL — ABNORMAL HIGH (ref 65–99)
Glucose-Capillary: 469 mg/dL — ABNORMAL HIGH (ref 65–99)
Glucose-Capillary: 328 mg/dL — ABNORMAL HIGH (ref 65–99)

## 2015-04-24 LAB — BASIC METABOLIC PANEL
Anion gap: 14 (ref 5–15)
BUN: 15 mg/dL (ref 6–20)
CALCIUM: 8.5 mg/dL — AB (ref 8.9–10.3)
CO2: 15 mmol/L — ABNORMAL LOW (ref 22–32)
CREATININE: 1.47 mg/dL — AB (ref 0.61–1.24)
Chloride: 104 mmol/L (ref 101–111)
GFR calc Af Amer: >60 mL/min (ref >60)
GLUCOSE: 131 mg/dL — AB (ref 65–99)
POTASSIUM: 4.3 mmol/L (ref 3.5–5.1)
SODIUM: 133 mmol/L — AB (ref 135–145)

## 2015-04-24 LAB — GLUCOSE, CAPILLARY
GLUCOSE-CAPILLARY: 188 mg/dL — AB (ref 65–99)
GLUCOSE-CAPILLARY: 132 mg/dL — AB (ref 65–99)
Glucose-Capillary: 139 mg/dL — ABNORMAL HIGH (ref 65–99)

## 2015-04-24 LAB — I-STAT TROPONIN, ED: Troponin i, poc: 0 ng/mL (ref 0.00–0.08)

## 2015-04-24 LAB — URINE MICROSCOPIC-ADD ON

## 2015-04-24 LAB — MRSA PCR SCREENING: MRSA by PCR: NEGATIVE

## 2015-04-24 MED ORDER — ATORVASTATIN CALCIUM 20 MG PO TABS
20.0000 mg | ORAL_TABLET | Freq: Every day | ORAL | Status: DC
  Administered 2015-04-24: 20 mg via ORAL
  Filled 2015-04-24: qty 1

## 2015-04-24 MED ORDER — INSULIN REGULAR BOLUS VIA INFUSION
0.0000 [IU] | Freq: Three times a day (TID) | INTRAVENOUS | Status: DC
  Filled 2015-04-24: qty 10

## 2015-04-24 MED ORDER — DEXTROSE-NACL 5-0.45 % IV SOLN
INTRAVENOUS | Status: DC
  Administered 2015-04-24: 21:00:00 via INTRAVENOUS

## 2015-04-24 MED ORDER — SODIUM CHLORIDE 0.9 % IV SOLN: INTRAVENOUS | Status: DC

## 2015-04-24 MED ORDER — SODIUM CHLORIDE 0.9 % IV SOLN
INTRAVENOUS | Status: DC
  Administered 2015-04-24: 16:00:00 via INTRAVENOUS

## 2015-04-24 MED ORDER — ONDANSETRON HCL 4 MG/2ML IJ SOLN
4.0000 mg | Freq: Once | INTRAMUSCULAR | Status: AC
  Administered 2015-04-24: 4 mg via INTRAVENOUS
  Filled 2015-04-24: qty 2

## 2015-04-24 MED ORDER — DEXTROSE 50 % IV SOLN: 25.0000 mL | INTRAVENOUS | Status: DC | PRN

## 2015-04-24 MED ORDER — ONDANSETRON HCL 4 MG/2ML IJ SOLN: 4.0000 mg | Freq: Three times a day (TID) | INTRAMUSCULAR | Status: DC | PRN

## 2015-04-24 MED ORDER — METOCLOPRAMIDE HCL 5 MG/ML IJ SOLN
10.0000 mg | Freq: Once | INTRAMUSCULAR | Status: AC
  Administered 2015-04-24: 10 mg via INTRAVENOUS
  Filled 2015-04-24: qty 2

## 2015-04-24 MED ORDER — ACETAMINOPHEN 650 MG RE SUPP: 650.0000 mg | Freq: Four times a day (QID) | RECTAL | Status: DC | PRN

## 2015-04-24 MED ORDER — SODIUM CHLORIDE 0.9 % IV SOLN
INTRAVENOUS | Status: DC
  Administered 2015-04-24: 0.7 [IU]/h via INTRAVENOUS
  Administered 2015-04-24: 4.7 [IU]/h via INTRAVENOUS
  Filled 2015-04-24: qty 2.5

## 2015-04-24 MED ORDER — HYDROCODONE-ACETAMINOPHEN 5-325 MG PO TABS
1.0000 | ORAL_TABLET | Freq: Once | ORAL | Status: AC
  Administered 2015-04-24: 1 via ORAL
  Filled 2015-04-24: qty 1

## 2015-04-24 MED ORDER — ENOXAPARIN SODIUM 40 MG/0.4ML ~~LOC~~ SOLN
40.0000 mg | SUBCUTANEOUS | Status: DC
  Administered 2015-04-24: 40 mg via SUBCUTANEOUS
  Filled 2015-04-24: qty 0.4

## 2015-04-24 MED ORDER — SODIUM CHLORIDE 0.9 % IJ SOLN
3.0000 mL | Freq: Two times a day (BID) | INTRAMUSCULAR | Status: DC
Start: 2015-04-24 — End: 2015-04-25
  Administered 2015-04-24: 3 mL via INTRAVENOUS

## 2015-04-24 MED ORDER — ACETAMINOPHEN 325 MG PO TABS: 650.0000 mg | ORAL_TABLET | Freq: Four times a day (QID) | ORAL | Status: DC | PRN

## 2015-04-24 MED ORDER — SODIUM CHLORIDE 0.9 % IV BOLUS (SEPSIS)
2000.0000 mL | Freq: Once | INTRAVENOUS | Status: AC
  Administered 2015-04-24: 2000 mL via INTRAVENOUS

## 2015-04-24 NOTE — ED Provider Notes (Signed)
CSN: [id number]     Arrival date & time 04/24/15  1248 History   First MD Initiated Contact with Patient 04/24/15 1248     Chief Complaint  Patient presents with  . Chest Pain     (Consider location/radiation/quality/duration/timing/severity/associated sxs/prior Treatment) HPI Comments: Joseph Barr is a 19 y/o M with a pmhx of uncontrolled Type 1 DM, who presents today with chest pain that began around 11:30am. Pt states that he was riding in the car when he felt sudden onset left sided chest pain that is sharp in nature and has stayed constant since that time. Pt called EMS. Pain does not radiate, 10/10 in severity. Pt had associated nausea, vomiting x1, light headedness, and weakness. No syncope. Pt also states that he has not taken his insluin since last night bc he was out of needles. Pt takes 5-8 shots of insulin per day. Pt endorsing polyuria, polydipsia, and flushing. Denies fevers, chills, syncope, headache, abdominal pain, visual changes, diarrhea, hematemesis.   The history is provided by the patient.    Past Medical History  Diagnosis Date  . Diabetes mellitus    History reviewed. No pertinent past surgical history. Family History  Problem Relation Age of Onset  . Cancer Maternal Grandfather   . Hypertension Mother   . Diabetes Neg Hx    Social History  Substance Use Topics  . Smoking status: Never Smoker   . Smokeless tobacco: None  . Alcohol Use: No    Review of Systems  Constitutional: Negative for fever, chills, diaphoresis and fatigue.  HENT: Negative for congestion.   Eyes: Negative for visual disturbance.  Respiratory: Positive for chest tightness. Negative for cough, shortness of breath and wheezing.        No accessory muscle use  Cardiovascular: Positive for chest pain. Negative for palpitations and leg swelling.  Gastrointestinal: Positive for nausea and vomiting. Negative for abdominal pain, diarrhea and constipation.  Endocrine: Positive for heat  intolerance, polydipsia and polyuria.  Genitourinary: Negative for dysuria, hematuria, flank pain and difficulty urinating.  Skin: Negative for color change, pallor, rash and wound.  Neurological: Positive for weakness and light-headedness. Negative for syncope and headaches.  Psychiatric/Behavioral: Negative.   All other systems reviewed and are negative.     Allergies  Review of patient's allergies indicates no known allergies.  Home Medications   Prior to Admission medications   Medication Sig Start Date End Date Taking? Authorizing Provider  atorvastatin (LIPITOR) 20 MG tablet Take 1 tablet (20 mg total) by mouth daily. Patient not taking: Reported on 10/30/2014 07/05/14   Lorayne Marek, MD  Blood Glucose Monitoring Suppl (ACCU-CHEK AVIVA PLUS) W/DEVICE KIT 1 Device by Does not apply route once. 06/26/14   Renato Shin, MD  glucose blood (ACCU-CHEK AVIVA) test strip 1 each by Other route 4 (four) times daily. And lancets 250.03 06/26/14   Renato Shin, MD  Insulin Aspart (NOVOLOG FLEXPEN [place]) Inject into the skin. 3 times a day (just before each meal): 20-20-24 units, and pen needles 4/day    Historical Provider, MD  Insulin Glargine (TOUJEO SOLOSTAR [place]) Inject 35 Units into the skin at bedtime.     Historical Provider, MD  Insulin Pen Needle 32G X 4 MM MISC Use 4 times daily 11/14/14   Renato Shin, MD  ondansetron (ZOFRAN) 4 MG tablet Take 1 tablet (4 mg total) by mouth every 8 (eight) hours as needed for nausea or vomiting. 12/06/14   Arnoldo Morale, MD   BP 115/60 mmHg  Pulse 99  Temp(Src) 97.6 F (36.4 C) (Oral)  Resp 20  Ht 6' (1.829 m)  Wt 136 lb (61.689 kg)  BMI 18.44 kg/m2  SpO2 100% Physical Exam  Constitutional: He is oriented to person, place, and time. He appears well-developed and well-nourished. He appears distressed.  HENT:  Head: Normocephalic and atraumatic.  Mouth/Throat: No oropharyngeal exudate.  Eyes: Conjunctivae and EOM are normal. Pupils are equal, round,  and reactive to light. No scleral icterus.  Cardiovascular: Regular rhythm, normal heart sounds and intact distal pulses.  Exam reveals no gallop and no friction rub.   No murmur heard. Tachycardicv HR 100bpm   Pulmonary/Chest: Breath sounds normal. No respiratory distress. He has no wheezes. He has no rales. He exhibits no tenderness.  Deep inspiration limited by pain  Abdominal: Soft. Bowel sounds are normal. He exhibits no distension and no mass. There is tenderness ( TTP of epigastric region. Pt states "it hurts in my chest when you push there"). There is no rebound and no guarding.  Musculoskeletal: Normal range of motion. He exhibits no edema or tenderness.  Lymphadenopathy:    He has no cervical adenopathy.  Neurological: He is alert and oriented to person, place, and time. No cranial nerve deficit.  Skin: Skin is warm and dry. No rash noted. He is not diaphoretic. No erythema. There is pallor.  Psychiatric: He has a normal mood and affect. His behavior is normal.  Vitals reviewed.   ED Course  Procedures (including critical care time)  Pt brought in by [facility] CBG reading too high to read.  Pt given 2L of fluid VBG drawn, revealed pH of 7.161 pCO2 32.1 HCO3  Pt started on glucose stabilization insulin Given reglan for nausea Spoke with admitting physician, will admit to step down  2:19 PM  CRITICAL CARE Performed by: Carlos Levering   Total critical care time: 1 hr  Critical care time was exclusive of separately billable procedures and treating other patients.  Critical care was necessary to treat or prevent imminent or life-threatening deterioration.  Critical care was time spent personally by me on the following activities: development of treatment plan with patient and/or surrogate as well as nursing, discussions with consultants, evaluation of patient's response to treatment, examination of patient, obtaining history from patient or surrogate, ordering and  performing treatments and interventions, ordering and review of laboratory studies, ordering and review of radiographic studies, pulse oximetry and re-evaluation of patient's condition.  Corrected Na is 141.35    Labs Review Labs Reviewed  URINALYSIS, ROUTINE W REFLEX MICROSCOPIC (NOT AT [facility]) - Abnormal; Notable for the following:    Specific Gravity, Urine 1.031 (*)    Glucose, UA >1000 (*)    Ketones, ur >80 (*)    All other components within normal limits  COMPREHENSIVE METABOLIC PANEL - Abnormal; Notable for the following:    Sodium 130 (*)    Chloride 91 (*)    CO2 10 (*)    Glucose, Bld 698 (*)    BUN 21 (*)    Creatinine, Ser 1.79 (*)    Total Bilirubin 2.1 (*)    GFR calc non Af Amer 53 (*)    Anion gap 29 (*)    All other components within normal limits  I-STAT CHEM 8, ED - Abnormal; Notable for the following:    Sodium 127 (*)    Chloride 98 (*)    BUN 27 (*)    Glucose, Bld >700 (*)    All  other components within normal limits  I-STAT VENOUS BLOOD GAS, ED - Abnormal; Notable for the following:    pH, Ven 7.161 (*)    pCO2, Ven 32.1 (*)    pO2, Ven 92.0 (*)    Bicarbonate 11.5 (*)    Acid-base deficit 16.0 (*)    All other components within normal limits  CBG MONITORING, ED - Abnormal; Notable for the following:    Glucose-Capillary >600 (*)    All other components within normal limits  CBG MONITORING, ED - Abnormal; Notable for the following:    Glucose-Capillary 530 (*)    All other components within normal limits  CBG MONITORING, ED - Abnormal; Notable for the following:    Glucose-Capillary 469 (*)    All other components within normal limits  CBG MONITORING, ED - Abnormal; Notable for the following:    Glucose-Capillary 328 (*)    All other components within normal limits  CBC WITH DIFFERENTIAL/PLATELET  URINE MICROSCOPIC-ADD ON  BLOOD GAS, VENOUS  BASIC METABOLIC PANEL  URINE RAPID DRUG SCREEN, HOSP PERFORMED  HEMOGLOBIN A1C  I-STAT TROPOININ,  ED    Imaging Review  Dg Chest 2 View  04/24/2015   CLINICAL DATA:  Pt c/o mid chest pain radiating to left side; onset today accompanied with SOB; hx diabetic, smoker  EXAM: CHEST  2 VIEW  COMPARISON:  01/11/2009  FINDINGS: The heart size and mediastinal contours are within normal limits. Both lungs are clear. No pleural effusion or pneumothorax. The visualized skeletal structures are unremarkable.  IMPRESSION: Normal chest radiographs.   Electronically Signed   By: Lajean Manes M.D.   On: 04/24/2015 14:23   I have personally reviewed and evaluated these images and lab results as part of my medical decision-making.   EKG Interpretation None      <ECG> EKG: sinus tachycardia. No prior EKG for comparison   MDM   Final diagnoses:  Diabetic ketoacidosis without coma associated with type 1 diabetes mellitus   Pt seen for sudden onset chest pain that began at 11:30 am. Pt also has not taken insulin since last night. Hx of uncontroll Type 1 DM. Pt endorses nausea, vomiting, weakness. VBG revealed pH 7.16, pCO2 32.1, HCO311.5. Indicates DKA. Glucose is 698. CXR negative. Troponin negative. PNA, pneumothorax, MI, unlikely source for chest pain. No trauma. Low risk for PE. Pt given 2L fluid bolus, and placed on glucose stabilizer insulin. K is 5. Pt given Reglan for nausea, with relief. Chest pain resolved. Pt will be admitted to step down unit for further management and treatment. CBG trending down: Halma, PA-C 04/24/15 1846  Merrily Pew, MD 04/27/15 [id number]  Merrily Pew, MD 04/27/15 [id number]

## 2015-04-24 NOTE — Progress Notes (Signed)
Patient arrived to 2C05 via stretcher. Patient ambulated to bed without difficulty. Patient denies any pain or discomfort at this time.

## 2015-04-24 NOTE — ED Notes (Signed)
Attempted report to 2C.  

## 2015-04-24 NOTE — Progress Notes (Signed)
**  Interval Note**  Stopped by to check on patient.  He reports that he is feeling much better than on admission.   On exam, patient is sitting up in bed, non toxic appearing, he is breathing normally on RA, HR 90's.    A/P: DKA. BG down to 180's.  BMET pending. -Switch to D5 1/2NS -Bridge with sub-q Lantus once AG closes -Anticipate that he will be transferred to Med Surg in am. -Will continue to monitor overnight.  Mimie Goering M. Nadine Counts, DO PGY-2, [facility]

## 2015-04-24 NOTE — H&P (Signed)
[facility] Admission History and Physical Service Pager: [phone]  Patient name: Joseph Barr Medical record number: [id number] Date of birth: 1996/06/02 Age: 19 y.o. Gender: male  Primary Care Provider: Lorayne Marek, MD Consultants: None Code Status: Full  Chief Complaint: hyperglycemia, chest pain  Assessment and Plan: Joseph Barr is a 19 y.o. male presenting with DKA. PMH is significant for T1DM diagnosed at age 30, with 2 episodes of DKA around time of diagnosis and one admission in 2013 for hyperosmolar hyperglycemia with ketonuria in 2013 (age 64). No apparent infectious trigger and appears euvolemic. However, he was hypotensive to 103/53, tachycardic to 102, Cr 1.79 and with high urine specific gravity, so dehydration is most likely cause. Claims to be adherent to insulin regimen.   Poorly controlled T1DM now in DKA: AG 29 on presentation. CBGs > 700. pH on VBG 7.16. Last took insulin evening of 8/22. Last hgb A1c was 12.1 on 06/08/14.  - Admit to SDU under Dr Erin Hearing - Vitals per floor protocol - Started on glucose stabilizer protocol.  Will bridge with sub-q insulin over 2 hours once anion gap has closed. - CBGs q1 hour until off of insulin gtt. - Continue NS '@125cc' /h until BG <250 then switch to D5 1/2NS until off insulin gtt. - Repeat BMETs every 2 hours.  Plan to space out once AG closes. - Will plan restart home toujeo when anion gap closes - Obtain hgb A1c  - UDS ordered  Chest pain: Reproducible. EKG, troponin negative. Appears MSK in nature. - PRN tylenol - Will not trend troponins.   HLD: Last lipid panel 07/2014 with cholesterol of 208 and triglycerides of 272.  - Restart home atorvastatin 20 mg.   AKI: Cr to 1.79, with baseline around 0.8.  - POC Cr improved to 0.90 after 2L fluid bolus.  Though upon repeat Serum Cr 1.67. - BUN remains elevated at 27 - Will continue to monitor.  BMET as above.  Continue fluids as  above.  FEN/GI: NPO until off insulin gtt, NS '@125cc' /h until BG <250 then D5 1/2NS Prophylaxis: lovenox  Disposition: Admitted to SDU under Family Medicine Teaching Service for treatment of DKA.  Expect that patient will be able to be discharged to home once stable  History of Present Illness: Joseph Barr is a 19 y.o. male presenting with chest pain and hyperglycemia, found to be in DKA. PMH is significant for T1DM diagnosed in 2005, HLD and concussion in 2015. He first felt unwell around 11:30 a.m. this morning, when he started having chest pain. He describes the chest pain as stabbing, mostly on the left side of his chest but also across the front with radiation down his left arm and in his abdomen below his sternum. He has never had chest pain before, and it is associated with SOB. Talking makes the chest pain worse. He didn't eat breakfast and was about to give himself some insulin this morning but didn't have needles with him to take insulin. The last time he checked his blood sugar was last night around 2230, and it was 240. He went to work but had to lie down because he felt light-headed. He also vomited. He reports that he has been taking his insulin regularly and checks his sugars 4-5 times a day. He denies fevers, chills, cough, dysuria. He has no sick contacts. He has not been outside in the heat. He sees endocrine every 3 months and denies any recent changes in his  medications (managed by [facility]). However, he does not have a recorded office visit with Endrocrinology since 10/30/2014.    On admission to the [facility], CBG > 700, pH on VBG of 7.16 and bicarb of 11.5 with anion gap of 29. Potassium was 5.0. Patient continued to endorse chest pain. EKG showed sinus tachycardia with possible left atrial enlargement; no priors for comparison. Troponin negative. Pain was reproducible with palpation. UA positive for glucose > 1000, ketones > 80, specific gravity 1.031; negative for nitrites and  leukocytes. He received norco for chest pain, and reglan and zofran for nausea. A 2L NS bolus was administered. Was started on fluids and insulin gtt.   Review Of Systems: Per HPI with the following additions: Denies diarrhea.  Otherwise 12 point review of systems was performed and was unremarkable.  Patient Active Problem List   Diagnosis Date Noted  . Nausea with vomiting 12/06/2014  . Diabetes mellitus type 1 01/06/2012  . Hyperglycemia 01/06/2012  . Ketonuria 01/06/2012  . Headache(784.0) 01/06/2012   Past Medical History: Past Medical History  Diagnosis Date  . Diabetes mellitus    Past Surgical History: History reviewed. No pertinent past surgical history. Social History: Social History  Substance Use Topics  . Smoking status: Never Smoker   . Smokeless tobacco: None  . Alcohol Use: No   Additional social history: Smokes about 1 cigarette a month, smokes marijuana about once every 4 months. He does not drink or use smokeless tobacco.  Please also refer to relevant sections of EMR.  Family History: Family History  Problem Relation Age of Onset  . Cancer Maternal Grandfather   . Hypertension Mother   . Diabetes Neg Hx    Allergies and Medications: No Known Allergies No current facility-administered medications on file prior to encounter.   Current Outpatient Prescriptions on File Prior to Encounter  Medication Sig Dispense Refill  . atorvastatin (LIPITOR) 20 MG tablet Take 1 tablet (20 mg total) by mouth daily. (Patient not taking: Reported on 10/30/2014) 90 tablet 3  . Blood Glucose Monitoring Suppl (ACCU-CHEK AVIVA PLUS) W/DEVICE KIT 1 Device by Does not apply route once. 1 kit 0  . glucose blood (ACCU-CHEK AVIVA) test strip 1 each by Other route 4 (four) times daily. And lancets 250.03 120 each 12  . Insulin Aspart (NOVOLOG FLEXPEN [place]) Inject into the skin. 3 times a day (just before each meal): 20-20-24 units, and pen needles 4/day    . Insulin Glargine (TOUJEO  SOLOSTAR [place]) Inject 35 Units into the skin at bedtime.     . Insulin Pen Needle 32G X 4 MM MISC Use 4 times daily 100 each 3  . ondansetron (ZOFRAN) 4 MG tablet Take 1 tablet (4 mg total) by mouth every 8 (eight) hours as needed for nausea or vomiting. 20 tablet 0   Objective: BP 107/51 mmHg  Pulse 96  Temp(Src) 97.6 F (36.4 C) (Oral)  Resp 18  Ht 6' (1.829 m)  Wt 136 lb (61.689 kg)  BMI 18.44 kg/m2  SpO2 100% Exam: General: Thin young man, resting in bed in NAD Eyes: PERRL ENTM: MMM Neck: FROM Cardiovascular: Tachycardic, S1, S2, no murmurs, rubs or gallops Respiratory: CTAB Abdomen: Tender to palpation in epigastric area.  MSK: Moves all limbs spontaneously. Strength intact. Left-sided chest wall tenderness with palpation.  Skin: WWP Neuro: AAOx3. Follows commands. CN II-XII intact.  Psych: Appropriate mood and affect but appears tired. Answered questions appropriately.   Labs and Imaging: CBC BMET  Recent Labs Lab 04/24/15 1338 04/24/15 1401  WBC 7.6  --   HGB 14.6 16.0  HCT 40.7 47.0  PLT 308  --     Recent Labs Lab 04/24/15 1338 04/24/15 1401  NA 130* 127*  K 4.9 5.0  CL 91* 98*  CO2 10*  --   BUN 21* 27*  CREATININE 1.79* 0.90  GLUCOSE 698* >700*  CALCIUM 9.4  --      Urinalysis    Component Value Date/Time   COLORURINE YELLOW 04/24/2015 [place] 04/24/2015 1305   LABSPEC 1.031* 04/24/2015 1305   PHURINE 5.0 04/24/2015 1305   GLUCOSEU >1000* 04/24/2015 1305   HGBUR NEGATIVE 04/24/2015 1305   BILIRUBINUR NEGATIVE 04/24/2015 1305   BILIRUBINUR neg 12/06/2014 1715   KETONESUR >80* 04/24/2015 1305   PROTEINUR NEGATIVE 04/24/2015 1305   PROTEINUR 30 12/06/2014 1715   UROBILINOGEN 0.2 04/24/2015 1305   UROBILINOGEN 0.2 12/06/2014 1715   NITRITE NEGATIVE 04/24/2015 1305   NITRITE neg 12/06/2014 1715   LEUKOCYTESUR NEGATIVE 04/24/2015 1305    8/23 - VBG: pH 7.161, PCO2 32.1, pO2 92.0, Bicarb 11.5, TCO2 12, O2 sat 95%,  Acid-base deficit 16.0 8/23 - Troponin 0.00 8/23 - EKG: sinus tachycardia, possible left atrial enlargement  Dg Chest 2 View  04/24/2015   CLINICAL DATA:  Pt c/o mid chest pain radiating to left side; onset today accompanied with SOB; hx diabetic, smoker  EXAM: CHEST  2 VIEW  COMPARISON:  01/11/2009  FINDINGS: The heart size and mediastinal contours are within normal limits. Both lungs are clear. No pleural effusion or pneumothorax. The visualized skeletal structures are unremarkable.  IMPRESSION: Normal chest radiographs.   Electronically Signed   By: Lajean Manes M.D.   On: 04/24/2015 14:23   I have discussed the findings and exam with Dr Brita Romp and Dr Ola Spurr and agree with the above note.  My changes/additions are outlined in BLUE.   Takya Vandivier M. Lajuana Ripple, DO PGY-2, [place]

## 2015-04-24 NOTE — ED Notes (Signed)
Pt arrives via [facility]. Pt states he was on the way to work when he began having pain across his chest and his epigastric area. Pt now has c/o of left sided cp and nausea. Pt states he hasn't had insulin since last night, but normally takes his insulin 5x/day. Pt has hx of IDDM type 1. Pt CBG is reading high.

## 2015-04-24 NOTE — Progress Notes (Signed)
Pt is politely asking to leave AMA. Family Medicine Teaching Service notified.   Alba Destine, RN, BSN

## 2015-04-24 NOTE — ED Provider Notes (Signed)
Medical screening examination/treatment/procedure(s) were conducted as a shared visit with non-physician practitioner(s) and myself.  I personally evaluated the patient during the encounter.  Chest pain, nausea and vomiting for a day. Hyperglycemia. Exam in NAD, slightly tachycardic and dry MM. Labs c/w DKA, insulin drip started and admitted.    EKG Interpretation   Date/Time:  Tuesday April 24 2015 12:56:45 EDT Ventricular Rate:  101 PR Interval:  122 QRS Duration: 93 QT Interval:  346 QTC Calculation: 448 R Axis:   87 Text Interpretation:  Sinus tachycardia Probable left atrial enlargement  Baseline wander in lead(s) I II aVR ED PHYSICIAN INTERPRETATION AVAILABLE  IN [facility] Confirmed by TEST, Record ([id number]) on 04/25/2015 8:00:48  AM        Marily Memos, MD 04/27/15 [phone]

## 2015-04-24 NOTE — Progress Notes (Signed)
**  Progress note**  Paged by RN, who reports patient is asking to leave AMA.  Visited patient, who requests to leave.  Reiterated that this would be against medical advise and that he is strongly encouraged to at least stay overnight for continued monitoring/management.  Risks were discussed and patient voices good understanding of risks, including the possibility of death.  He is feeling better and is still opting to leave AMA.  He reports that he has f/u with his endocrinologist tomorrow.  RN to provided [facility] paperwork.  Joseph M. Nadine Counts, DO PGY-2, [facility] Family Medicine

## 2015-04-24 NOTE — ED Notes (Signed)
Patient transported to X-ray 

## 2015-04-25 ENCOUNTER — Ambulatory Visit: Payer: Self-pay | Admitting: Internal Medicine

## 2015-04-25 LAB — HEMOGLOBIN A1C
Hgb A1c MFr Bld: 14.3 % — ABNORMAL HIGH (ref 4.8–5.6)
MEAN PLASMA GLUCOSE: 364 mg/dL

## 2015-04-25 NOTE — Progress Notes (Signed)
Provided AMA paperwork for pt to sign. 2 nurses witnessed and signed the document.  Alba Destine, RN, BSN

## 2015-04-25 NOTE — Discharge Summary (Signed)
[facility] Discharge Summary  Patient name: LENNEX PIETILA Medical record number: [id number] Date of birth: Dec 19, 1995 Age: 19 y.o. Gender: male Date of Admission: 04/24/2015  Date of Discharge: 04/24/2015 Admitting Physician: Lind Covert, MD  Primary Care Provider: Lorayne Marek, MD Consultants: None  Indication for Hospitalization: DKA  Discharge Diagnoses/Problem List:  - DKA - Poorly controlled T1DM  Disposition: Home, Left AMA  Discharge Condition: Stable  Discharge Exam: Please see H&P.   Brief Hospital Course:  KELECHI ORGERON is a 19 y.o. male who presented in DKA with chest and abdominal pain. PMH is significant for T1DM diagnosed at age 39, with 2 episodes of DKA around time of diagnosis and one admission in 2013 for hyperosmolar hyperglycemia with ketonuria in 2013 (age 65). Anion gap was 29 on presentation with CBGs > 700. PH on VBG was 7.16.  He was started on glucose stabilizer protocol. Later on the evening of admission, patient was requesting to leave, as he felt much better. At that time, his anion gap had closed (14), potassium was 4.3 and CBG was 131. He was counseled not to leave AMA, as risks include death. He expressed understanding but signed AMA paperwork and left, claiming he had an endocrinology appointment the next day.  He had no apparent infectious trigger and appeared euvolemic. However, on admission, he was hypotensive to 103/53, tachycardic to 102, Cr 1.79 and with high urine specific gravity, so dehydration was likely contributing. He claimed to be adherent to insulin regimen and last took his insulin the evening of 8/22. However, A1c was 14.3 on 04/24/15 compared to 12.1 on 06/08/14.   Issues for Follow Up:  1. Poorly controlled T1DM, concerning for future episodes of DKA. Question adherence to insulin regimen given hgb A1c of 14.3 vs. development of insulin resistance. Would benefit from further counseling about  consequences of poorly controlled blood sugars.   Significant Procedures: None  Significant Labs and Imaging:   Recent Labs Lab 04/24/15 1338 04/24/15 1401  WBC 7.6  --   HGB 14.6 16.0  HCT 40.7 47.0  PLT 308  --     Recent Labs Lab 04/24/15 1338 04/24/15 1401 04/24/15 1909 04/24/15 2052  NA 130* 127* 134* 133*  K 4.9 5.0 4.3 4.3  CL 91* 98* 106 104  CO2 10*  --  11* 15*  GLUCOSE 698* >700* 214* 131*  BUN 21* 27* 17 15  CREATININE 1.79* 0.90 1.67* 1.47*  CALCIUM 9.4  --  8.5* 8.5*  ALKPHOS 78  --   --   --   AST 35  --   --   --   ALT 37  --   --   --   ALBUMIN 4.2  --   --   --    Dg Chest 2 View  04/24/2015   CLINICAL DATA:  Pt c/o mid chest pain radiating to left side; onset today accompanied with SOB; hx diabetic, smoker  EXAM: CHEST  2 VIEW  COMPARISON:  01/11/2009  FINDINGS: The heart size and mediastinal contours are within normal limits. Both lungs are clear. No pleural effusion or pneumothorax. The visualized skeletal structures are unremarkable.  IMPRESSION: Normal chest radiographs.   Electronically Signed   By: Lajean Manes M.D.   On: 04/24/2015 14:23    Results/Tests Pending at Time of Discharge: None  Discharge Medications:    Medication List    ASK your doctor about these medications  ACCU-CHEK AVIVA PLUS W/DEVICE Kit  1 Device by Does not apply route once.     atorvastatin 20 MG tablet  Commonly known as:  LIPITOR  Take 1 tablet (20 mg total) by mouth daily.     glucose blood test strip  Commonly known as:  ACCU-CHEK AVIVA  1 each by Other route 4 (four) times daily. And lancets 250.03     Insulin Pen Needle 32G X 4 MM Misc  Use 4 times daily     ondansetron 4 MG tablet  Commonly known as:  ZOFRAN  Take 1 tablet (4 mg total) by mouth every 8 (eight) hours as needed for nausea or vomiting.     TOUJEO SOLOSTAR [place]  Inject 40 Units into the skin at bedtime.        Discharge Instructions: Please refer to Patient Instructions  section of EMR for full details.  Patient was counseled important signs and symptoms that should prompt return to medical care, changes in medications, dietary instructions, activity restrictions, and follow up appointments.   Follow-Up Appointments: Patient stated he had endocrinology appointment 04/25/15.   Rogue Bussing, MD 04/25/2015, 7:09 AM PGY-1, [place]

## 2015-04-26 ENCOUNTER — Ambulatory Visit: Payer: Self-pay | Admitting: Endocrinology

## 2015-05-01 ENCOUNTER — Ambulatory Visit: Payer: No Typology Code available for payment source | Admitting: Endocrinology

## 2015-05-01 ENCOUNTER — Ambulatory Visit: Payer: Self-pay | Admitting: Endocrinology

## 2015-05-09 ENCOUNTER — Ambulatory Visit (INDEPENDENT_AMBULATORY_CARE_PROVIDER_SITE_OTHER): Payer: No Typology Code available for payment source | Admitting: Endocrinology

## 2015-05-09 ENCOUNTER — Encounter: Payer: Self-pay | Admitting: Endocrinology

## 2015-05-09 VITALS — BP 112/58 | HR 109 | Temp 98.0°F | Ht 72.0 in | Wt 137.0 lb

## 2015-05-09 DIAGNOSIS — E1010 Type 1 diabetes mellitus with ketoacidosis without coma: Secondary | ICD-10-CM

## 2015-05-09 NOTE — Progress Notes (Signed)
Subjective:    Patient ID: Joseph Barr, male    DOB: 1996/02/28, 19 y.o.   MRN: [id number]  HPI Pt returns for f/u of diabetes mellitus: DM type: 1 Dx'ed: 6294 Complications: none Therapy: insulin since dx DKA: twice: at dx, and in 2013 Severe hypoglycemia: 1 episode--was soon after dx. Pancreatitis: never Other: he takes multiple daily injections. Interval history: He was recently hospitalized with DKA.  He feels well now.  He takes toujeo as rx'ed.  Meter is downloaded today, and the printout is scanned into the record.  It varies from 170-400.  It is in general higher as the day goes on.  He takes toujeo, 40 units qd, and prn humalog (averages approx 30-60 units total per day).  He says he misses the humalog only once per week.   Past Medical History  Diagnosis Date  . Diabetes mellitus     No past surgical history on file.  Social History   Social History  . Marital Status: Single    Spouse Name: N/A  . Number of Children: N/A  . Years of Education: N/A   Occupational History  . Not on file.   Social History Main Topics  . Smoking status: Never Smoker   . Smokeless tobacco: Not on file  . Alcohol Use: No  . Drug Use: Yes    Special: Marijuana  . Sexual Activity: Not on file   Other Topics Concern  . Not on file   Social History Narrative    Current Outpatient Prescriptions on File Prior to Visit  Medication Sig Dispense Refill  . Blood Glucose Monitoring Suppl (ACCU-CHEK AVIVA PLUS) W/DEVICE KIT 1 Device by Does not apply route once. 1 kit 0  . glucose blood (ACCU-CHEK AVIVA) test strip 1 each by Other route 4 (four) times daily. And lancets 250.03 120 each 12  . Insulin Glargine (TOUJEO SOLOSTAR [place]) Inject 50 Units into the skin every morning.     . Insulin Pen Needle 32G X 4 MM MISC Use 4 times daily 100 each 3   No current facility-administered medications on file prior to visit.    No Known Allergies  Family History  Problem Relation Age of  Onset  . Cancer Maternal Grandfather   . Hypertension Mother   . Diabetes Neg Hx     BP 112/58 mmHg  Pulse 109  Temp(Src) 98 F (36.7 C) (Oral)  Ht 6' (1.829 m)  Wt 137 lb (62.143 kg)  BMI 18.58 kg/m2  SpO2 94%  Review of Systems He denies hypoglycemia.      Objective:   Physical Exam VITAL SIGNS:  See vs page GENERAL: no distress Pulses: dorsalis pedis intact bilat.   MSK: no deformity of the feet CV: no leg edema Skin:  no ulcer on the feet.  normal color and temp on the feet. Neuro: sensation is intact to touch on the feet   Lab Results  Component Value Date   HGBA1C 14.3* 04/24/2015      Assessment & Plan:  DM: ongoing poor control.  The DKA should be interpreted as evidence of noncompliance with insulin.  The pattern of cbg's indicates he should avoid humalog at HS.  Also, he should emphasize toujeo over humalog, due to h/o noncompliance.    Patient is advised the following: Patient Instructions  check your blood sugar 4 times a day: before the 3 meals, and at bedtime.  also check if you have symptoms of your blood sugar being too  high or too low.  please keep a record of the readings and bring it to your next appointment here.  You can write it on any piece of paper.  please call us sooner if your blood sugar goes below 70, or if you have a lot of readings over 200. Please increase the toujeo to 50 units, and take it in the morning: reduce the humalog to 15 units 3 times a day (just before each meal), but take an 4 extra units whenever it is over 200.   Try not taking any humalog at bedtime.  Please come back for a follow-up appointment in 1 month.   Please call us next week, to tell us how the blood sugar is doing.

## 2015-05-09 NOTE — Patient Instructions (Addendum)
check your blood sugar 4 times a day: before the 3 meals, and at bedtime.  also check if you have symptoms of your blood sugar being too high or too low.  please keep a record of the readings and bring it to your next appointment here.  You can write it on any piece of paper.  please call us sooner if your blood sugar goes below 70, or if you have a lot of readings over 200. Please increase the toujeo to 50 units, and take it in the morning: reduce the humalog to 15 units 3 times a day (just before each meal), but take an 4 extra units whenever it is over 200.   Try not taking any humalog at bedtime.  Please come back for a follow-up appointment in 1 month.   Please call us next week, to tell us how the blood sugar is doing.

## 2015-06-08 ENCOUNTER — Ambulatory Visit: Payer: No Typology Code available for payment source | Admitting: Endocrinology

## 2015-06-25 ENCOUNTER — Emergency Department (HOSPITAL_COMMUNITY)
Admission: EM | Admit: 2015-06-25 | Discharge: 2015-06-25 | Disposition: A | Discharge status: Home / Self Care | Payer: Self-pay | Source: Home / Self Care

## 2015-07-02 ENCOUNTER — Ambulatory Visit: Payer: No Typology Code available for payment source | Admitting: Endocrinology

## 2015-07-03 ENCOUNTER — Ambulatory Visit: Payer: No Typology Code available for payment source | Admitting: Endocrinology

## 2015-07-03 DIAGNOSIS — Z0289 Encounter for other administrative examinations: Secondary | ICD-10-CM

## 2015-07-09 ENCOUNTER — Ambulatory Visit: Payer: No Typology Code available for payment source

## 2015-08-08 ENCOUNTER — Other Ambulatory Visit: Payer: Self-pay | Admitting: Internal Medicine

## 2015-08-22 ENCOUNTER — Ambulatory Visit: Payer: No Typology Code available for payment source | Attending: Family Medicine

## 2015-09-06 ENCOUNTER — Other Ambulatory Visit: Payer: Self-pay

## 2015-09-06 MED ORDER — INSULIN GLARGINE 300 UNIT/ML ~~LOC~~ SOPN: 50.0000 [IU] | PEN_INJECTOR | SUBCUTANEOUS | Status: DC

## 2016-07-10 ENCOUNTER — Ambulatory Visit: Payer: Self-pay | Attending: Internal Medicine

## 2016-08-01 ENCOUNTER — Ambulatory Visit: Payer: Self-pay | Attending: Family Medicine | Admitting: Family Medicine

## 2016-08-01 ENCOUNTER — Encounter: Payer: Self-pay | Admitting: Family Medicine

## 2016-08-01 VITALS — BP 126/79 | HR 83 | Temp 97.6°F | Resp 16 | Ht 73.0 in | Wt 148.0 lb

## 2016-08-01 DIAGNOSIS — R739 Hyperglycemia, unspecified: Secondary | ICD-10-CM

## 2016-08-01 DIAGNOSIS — Z794 Long term (current) use of insulin: Secondary | ICD-10-CM | POA: Insufficient documentation

## 2016-08-01 DIAGNOSIS — E559 Vitamin D deficiency, unspecified: Secondary | ICD-10-CM

## 2016-08-01 DIAGNOSIS — Z0000 Encounter for general adult medical examination without abnormal findings: Secondary | ICD-10-CM | POA: Insufficient documentation

## 2016-08-01 DIAGNOSIS — E781 Pure hyperglyceridemia: Secondary | ICD-10-CM

## 2016-08-01 DIAGNOSIS — E1010 Type 1 diabetes mellitus with ketoacidosis without coma: Secondary | ICD-10-CM | POA: Insufficient documentation

## 2016-08-01 LAB — CBC WITH DIFFERENTIAL/PLATELET
Basophils Absolute: 53 cells/uL (ref 0–200)
Basophils Relative: 1 %
EOS PCT: 3 %
Eosinophils Absolute: 159 cells/uL (ref 15–500)
HCT: 44.5 % (ref 38.5–50.0)
HEMOGLOBIN: 14.8 g/dL (ref 13.2–17.1)
LYMPHS ABS: 1908 {cells}/uL (ref 850–3900)
LYMPHS PCT: 36 %
MCH: 29.4 pg (ref 27.0–33.0)
MCHC: 33.3 g/dL (ref 32.0–36.0)
MCV: 88.5 fL (ref 80.0–100.0)
MONOS PCT: 9 %
MPV: 11.5 fL (ref 7.5–12.5)
Monocytes Absolute: 477 cells/uL (ref 200–950)
NEUTROS PCT: 51 %
Neutro Abs: 2703 cells/uL (ref 1500–7800)
PLATELETS: 302 10*3/uL (ref 140–400)
RBC: 5.03 MIL/uL (ref 4.20–5.80)
RDW: 13.9 % (ref 11.0–15.0)
WBC: 5.3 10*3/uL (ref 3.8–10.8)

## 2016-08-01 LAB — GLUCOSE, POCT (MANUAL RESULT ENTRY): POC GLUCOSE: 362 mg/dL — AB (ref 70–99)

## 2016-08-01 LAB — POCT URINALYSIS DIPSTICK
Bilirubin, UA: NEGATIVE
GLUCOSE UA: 500
Ketones, UA: NEGATIVE
Leukocytes, UA: NEGATIVE
Nitrite, UA: NEGATIVE
Protein, UA: NEGATIVE
RBC UA: NEGATIVE
SPEC GRAV UA: 1.01
Urobilinogen, UA: 0.2
pH, UA: 6.5

## 2016-08-01 LAB — POCT GLYCOSYLATED HEMOGLOBIN (HGB A1C): Hemoglobin A1C: 11.3

## 2016-08-01 LAB — TSH: TSH: 1.44 mIU/L (ref 0.40–4.50)

## 2016-08-01 MED ORDER — INSULIN ASPART 100 UNIT/ML ~~LOC~~ SOLN
6.0000 [IU] | Freq: Once | SUBCUTANEOUS | Status: AC
  Administered 2016-08-01: 6 [IU] via SUBCUTANEOUS

## 2016-08-01 NOTE — Patient Instructions (Addendum)
Diabetes Mellitus and Food It is important for you to manage your blood sugar (glucose) level. Your blood glucose level can be greatly affected by what you eat. Eating healthier foods in the appropriate amounts throughout the day at about the same time each day will help you control your blood glucose level. It can also help slow or prevent worsening of your diabetes mellitus. Healthy eating may even help you improve the level of your blood pressure and reach or maintain a healthy weight. General recommendations for healthful eating and cooking habits include:  Eating meals and snacks regularly. Avoid going long periods of time without eating to lose weight.  Eating a diet that consists mainly of plant-based foods, such as fruits, vegetables, nuts, legumes, and whole grains.  Using low-heat cooking methods, such as baking, instead of high-heat cooking methods, such as deep frying. Work with your dietitian to make sure you understand how to use the Nutrition Facts information on food labels. How can food affect me? Carbohydrates  Carbohydrates affect your blood glucose level more than any other type of food. Your dietitian will help you determine how many carbohydrates to eat at each meal and teach you how to count carbohydrates. Counting carbohydrates is important to keep your blood glucose at a healthy level, especially if you are using insulin or taking certain medicines for diabetes mellitus. Alcohol  Alcohol can cause sudden decreases in blood glucose (hypoglycemia), especially if you use insulin or take certain medicines for diabetes mellitus. Hypoglycemia can be a life-threatening condition. Symptoms of hypoglycemia (sleepiness, dizziness, and disorientation) are similar to symptoms of having too much alcohol. If your health care provider has given you approval to drink alcohol, do so in moderation and use the following guidelines:  Women should not have more than one drink per day, and men  should not have more than two drinks per day. One drink is equal to:  12 oz of beer.  5 oz of wine.  1 oz of hard liquor.  Do not drink on an empty stomach.  Keep yourself hydrated. Have water, diet soda, or unsweetened iced tea.  Regular soda, juice, and other mixers might contain a lot of carbohydrates and should be counted. What foods are not recommended? As you make food choices, it is important to remember that all foods are not the same. Some foods have fewer nutrients per serving than other foods, even though they might have the same number of calories or carbohydrates. It is difficult to get your body what it needs when you eat foods with fewer nutrients. Examples of foods that you should avoid that are high in calories and carbohydrates but low in nutrients include:  Trans fats (most processed foods list trans fats on the Nutrition Facts label).  Regular soda.  Juice.  Candy.  Sweets, such as cake, pie, doughnuts, and cookies.  Fried foods. What foods can I eat? Eat nutrient-rich foods, which will nourish your body and keep you healthy. The food you should eat also will depend on several factors, including:  The calories you need.  The medicines you take.  Your weight.  Your blood glucose level.  Your blood pressure level.  Your cholesterol level. You should eat a variety of foods, including:  Protein.  Lean cuts of meat.  Proteins low in saturated fats, such as fish, egg whites, and beans. Avoid processed meats.  Fruits and vegetables.  Fruits and vegetables that may help control blood glucose levels, such as apples, mangoes, and   yams.  Dairy products.  Choose fat-free or low-fat dairy products, such as milk, yogurt, and cheese.  Grains, bread, pasta, and rice.  Choose whole grain products, such as multigrain bread, whole oats, and brown rice. These foods may help control blood pressure.  Fats.  Foods containing healthful fats, such as nuts,  avocado, olive oil, canola oil, and fish. Does everyone with diabetes mellitus have the same meal plan? Because every person with diabetes mellitus is different, there is not one meal plan that works for everyone. It is very important that you meet with a dietitian who will help you create a meal plan that is just right for you. This information is not intended to replace advice given to you by your health care provider. Make sure you discuss any questions you have with your health care provider. Document Released: 05/15/2005 Document Revised: 01/24/2016 Document Reviewed: 07/15/2013 Elsevier Interactive Patient Education  2017 [employer]  Follow up in 3 months.

## 2016-08-01 NOTE — Progress Notes (Signed)
F/u diabetes. Novolog 70/30

## 2016-08-01 NOTE — Progress Notes (Signed)
Subjective:  Patient ID: Joseph Barr, male    DOB: July 31, 1996  Age: 20 y.o. MRN: [id number]  CC: Annual Exam and Diabetes   HPI [name] comes to the office today for physical and diabetes follow up. He reports being followed by a endocrinologist for his type 1 diabetes. He says his next appointment with the endocrinologist is on 08/06/16. He reports taking his Novolog 70/30 this morning but says he doesn't believe it is effective. He has a glucometer at home that uses to check is blood sugars but reports CBG have been in the 200-300 range. CBG was found to be elevated this visit. He denies any headaches, lightheadedness, or dizziness. He denies any skin problems. He denies any paresthesias. He denies having an eye exam with the last year. He reports history of corrective lens use.  Outpatient Medications Prior to Visit  Medication Sig Dispense Refill  . Blood Glucose Monitoring Suppl (ACCU-CHEK AVIVA PLUS) W/DEVICE KIT 1 Device by Does not apply route once. 1 kit 0  . glucose blood (ACCU-CHEK AVIVA) test strip 1 each by Other route 4 (four) times daily. And lancets 250.03 120 each 12  . Insulin Glargine (TOUJEO SOLOSTAR) 300 UNIT/ML SOPN Inject 50 Units into the skin every morning. (Patient not taking: Reported on 08/01/2016) 3 pen 0  . insulin lispro (HUMALOG) 100 UNIT/ML injection Inject 15 Units into the skin 3 (three) times daily before meals.     . Insulin Pen Needle 32G X 4 MM MISC Use 4 times daily 100 each 3  . NOVOLOG 100 UNIT/ML injection INJECT 20 UNITS 3 TIMES A DAY WITH MEALS (Patient not taking: Reported on 08/01/2016) 10 mL 3   No facility-administered medications prior to visit.     ROS Review of Systems  Constitutional: Negative.   HENT: Negative.   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  Respiratory: Negative.   Cardiovascular: Negative.   Gastrointestinal: Negative.   Endocrine: Negative.     Genitourinary: Negative.   Musculoskeletal: Negative.   Skin: Negative.   Neurological: Negative.   Psychiatric/Behavioral: Negative.     Objective:  BP 126/79   Pulse 83   Temp 97.6 F (36.4 C) (Oral)   Resp 16   Ht '6\' 1"'  (1.854 m)   Wt 148 lb (67.1 kg)   SpO2 99%   BMI 19.53 kg/m   BP/Weight 08/01/2016 05/09/2015 2/95/2841  Systolic BP 324 401 027  Diastolic BP 79 58 61  Wt. (Lbs) 148 137 134.26  BMI 19.53 18.58 18.2    Physical Exam  Constitutional: He is oriented to person, place, and time. He appears well-developed and well-nourished.  HENT:  Right Ear: External ear normal.  Left Ear: External ear normal.  Nose: Nose normal.  Mouth/Throat: Oropharynx is clear and moist. No oropharyngeal exudate.  Eyes: Conjunctivae and EOM are normal. Pupils are equal, round, and reactive to light. Right eye exhibits no discharge. Left eye exhibits no discharge. No scleral icterus.  History of visual impairment. Vision screen performed.  Neck: Normal range of motion. Neck supple. No tracheal deviation present. No thyromegaly present.  Cardiovascular: Normal rate, regular rhythm, normal heart sounds and intact distal pulses.   Pulmonary/Chest: Effort normal and breath sounds normal. No respiratory distress.  Abdominal: Soft. Bowel sounds are normal. He exhibits no mass. There is no tenderness.  Musculoskeletal: Normal range of motion. He exhibits no edema or tenderness.  Lymphadenopathy:    He has no  cervical adenopathy.  Neurological: He is alert and oriented to person, place, and time.  Skin: Skin is warm and dry.  Monofilament foot exam wnl.   Psychiatric: He has a normal mood and affect. His behavior is normal. Thought content normal.     Assessment & Plan:   1. Type 1 diabetes mellitus with ketoacidosis without coma ([place]) - Patient was advised to follow up at upcoming his endocrinologist appointment to make changes to his current insulin medications. -Follow up in 3 months  for DM. - Glucose (CBG) - POCT glycosylated hemoglobin (Hb A1C) - POCT urinalysis dipstick - Basic Metabolic Panel - CBC with Differential - Lipid Panel - Ambulatory referral to Ophthalmology - Microalbumin/Creatinine Ratio, Urine  2. Physical exam, annual -Follow up in 1 year. - Basic Metabolic Panel - CBC with Differential - Lipid Panel - TSH - Vitamin D, 25-hydroxy - HIV antibody (with reflex) - Microalbumin/Creatinine Ratio, Urine - Visual acuity screening  3. Healthcare maintenance - Tdap vaccine greater than or equal to 7yo IM  4. Hyperglycemia Elevated blood glucose in office today.  - insulin aspart (novoLOG) injection 6 Units; Inject 0.06 mLs (6 Units total) into the skin once. -Recommended recheck in office pt.declined due to time constraints.    Meds ordered this encounter  Medications  . insulin aspart (novoLOG) injection 6 Units    Follow-up: Return in about 3 months (around 10/30/2016) for DM.   Alfonse Spruce FNP

## 2016-08-02 LAB — LIPID PANEL
CHOLESTEROL: 209 mg/dL — AB (ref <200)
HDL: 38 mg/dL — ABNORMAL LOW (ref >40)
TRIGLYCERIDES: 414 mg/dL — AB (ref <150)
Total CHOL/HDL Ratio: 5.5 Ratio — ABNORMAL HIGH (ref <5.0)

## 2016-08-02 LAB — BASIC METABOLIC PANEL
BUN: 10 mg/dL (ref 7–25)
CALCIUM: 9.7 mg/dL (ref 8.6–10.3)
CO2: 28 mmol/L (ref 20–31)
CREATININE: 0.77 mg/dL (ref 0.60–1.35)
Chloride: 101 mmol/L (ref 98–110)
GLUCOSE: 265 mg/dL — AB (ref 65–99)
Potassium: 3.9 mmol/L (ref 3.5–5.3)
SODIUM: 137 mmol/L (ref 135–146)

## 2016-08-02 LAB — MICROALBUMIN / CREATININE URINE RATIO
CREATININE, URINE: 55 mg/dL (ref 20–370)
MICROALB/CREAT RATIO: 7 ug/mg{creat} (ref <30)
Microalb, Ur: 0.4 mg/dL

## 2016-08-02 LAB — HIV ANTIBODY (ROUTINE TESTING W REFLEX): HIV: NONREACTIVE

## 2016-08-02 LAB — VITAMIN D 25 HYDROXY (VIT D DEFICIENCY, FRACTURES): VIT D 25 HYDROXY: 16 ng/mL — AB (ref 30–100)

## 2016-08-04 MED ORDER — GEMFIBROZIL 600 MG PO TABS: 600.0000 mg | ORAL_TABLET | Freq: Two times a day (BID) | ORAL | 2 refills | Status: DC

## 2016-08-04 MED ORDER — VITAMIN D (ERGOCALCIFEROL) 1.25 MG (50000 UNIT) PO CAPS: 50000.0000 [IU] | ORAL_CAPSULE | ORAL | 0 refills | Status: AC

## 2016-08-06 ENCOUNTER — Ambulatory Visit: Payer: Self-pay | Admitting: Endocrinology

## 2016-08-08 ENCOUNTER — Telehealth: Payer: Self-pay

## 2016-08-08 NOTE — Telephone Encounter (Signed)
-----   Message from Lizbeth BarkMandesia R Hairston, FNP sent at 08/04/2016  2:26 PM EST ----- -HIV results were negative. Continue safe sex practices. -Hbg A1c was 11.3 in office. Follow up with your endocrinologist appt.for better glycemic control.  -Triglycerides level was high. High Triglycerides can increase your risk of heart disease. Lopid was prescribed for you to lower your levels. Will recheck levels again in 3 months. Start eating a diet low in saturated fat. Limit your intake of fried foods, red meats, and   whole milk. -Begin exercising at least 3-5 times per week for at least 30 minutes. -Vitamin D level was low. Vitamin D helps to keep bones strong. You were prescribed ergocalciferol (capsules) to increase your vitamin-d level. Once finished start taking OTC vitamin d supplement with 800 international units (IU) of vitamin-d per day. Please come back in 3 months to have vitamin d level checked in office.

## 2016-08-08 NOTE — Progress Notes (Signed)
Letter sent.

## 2016-08-10 NOTE — Progress Notes (Deleted)
   Subjective:    Patient ID: Joseph Barr, male    DOB: 10/20/1995, 20 y.o.   MRN: [id number]  HPI Pt returns for f/u of diabetes mellitus: DM type: 1 Dx'ed: 2005 Complications: none Therapy: insulin since dx DKA: twice: at dx, and in 2013 Severe hypoglycemia: 1 episode--was soon after dx. Pancreatitis: never Other: he takes multiple daily injections. Interval history: He was recently hospitalized with DKA.  He feels well now.  He takes toujeo as rx'ed.  Meter is downloaded today, and the printout is scanned into the record.  It varies from 170-400.  It is in general higher as the day goes on.  He takes toujeo, 40 units qd, and prn humalog (averages approx 30-60 units total per day).  He says he misses the humalog only once per week.     Review of Systems     Objective:   Physical Exam VITAL SIGNS:  See vs page GENERAL: no distress Pulses: dorsalis pedis intact bilat.   MSK: no deformity of the feet CV: no leg edema Skin:  no ulcer on the feet.  normal color and temp on the feet. Neuro: sensation is intact to touch on the feet       Assessment & Plan:  DM: ongoing poor control.  The DKA should be interpreted as evidence of noncompliance with insulin.  The pattern of cbg's indicates he should avoid humalog at HS.  Also, he should emphasize toujeo over humalog, due to h/o noncompliance.

## 2016-08-11 ENCOUNTER — Ambulatory Visit: Payer: Self-pay | Admitting: Endocrinology

## 2016-10-30 ENCOUNTER — Ambulatory Visit: Payer: Self-pay | Admitting: Adult Health

## 2016-11-20 ENCOUNTER — Ambulatory Visit: Payer: Self-pay | Admitting: Adult Health

## 2017-04-07 ENCOUNTER — Ambulatory Visit (HOSPITAL_COMMUNITY)
Admission: EM | Admit: 2017-04-07 | Discharge: 2017-04-07 | Disposition: A | Discharge status: Home / Self Care | Payer: Self-pay | Attending: Family Medicine | Admitting: Family Medicine

## 2017-04-07 ENCOUNTER — Encounter (HOSPITAL_COMMUNITY): Payer: Self-pay | Admitting: *Deleted

## 2017-04-07 DIAGNOSIS — Z23 Encounter for immunization: Secondary | ICD-10-CM

## 2017-04-07 DIAGNOSIS — S91135A Puncture wound without foreign body of left lesser toe(s) without damage to nail, initial encounter: Secondary | ICD-10-CM

## 2017-04-07 DIAGNOSIS — W450XXA Nail entering through skin, initial encounter: Secondary | ICD-10-CM

## 2017-04-07 MED ORDER — TETANUS-DIPHTH-ACELL PERTUSSIS 5-2.5-18.5 LF-MCG/0.5 IM SUSP
0.5000 mL | Freq: Once | INTRAMUSCULAR | Status: AC
  Administered 2017-04-07: 0.5 mL via INTRAMUSCULAR

## 2017-04-07 MED ORDER — AMOXICILLIN-POT CLAVULANATE 875-125 MG PO TABS: 1.0000 | ORAL_TABLET | Freq: Two times a day (BID) | ORAL | 0 refills | Status: DC

## 2017-04-07 MED ORDER — TETANUS-DIPHTH-ACELL PERTUSSIS 5-2.5-18.5 LF-MCG/0.5 IM SUSP
INTRAMUSCULAR | Status: AC
  Filled 2017-04-07: qty 0.5

## 2017-04-07 NOTE — ED Triage Notes (Signed)
Pt  Reports  He  Stepped  On a  Rusty  Nail  Today      And   sustined  A  Puncture   Wound        He  Reports  He  Is  A  Diabetic  Takes  meds  For  Same    Unsure  Of  His  Last  Tetanus shot

## 2017-04-07 NOTE — Discharge Instructions (Signed)
Keep your wound clean, dry, monitor for signs of infection, if he seen any red streaking up your leg go to the emergency room. I started on an antibiotic called Augmentin, take one tablet twice a day. Follow up with your primary care provider in one week as needed.

## 2017-04-07 NOTE — ED Provider Notes (Signed)
  [facility]   [id number] 04/07/17 Arrival Time: 1630  ASSESSMENT & PLAN:  1. Puncture wound of fourth toe of left foot, initial encounter     Meds ordered this encounter  Medications  . Tdap (BOOSTRIX) injection 0.5 mL  . DISCONTD: amoxicillin-clavulanate (AUGMENTIN) 875-125 MG tablet    Sig: Take 1 tablet by mouth 2 (two) times daily.    Dispense:  14 tablet    Refill:  0    Order Specific Question:   Supervising Provider    Answer:   Mardella LaymanHAGLER, BRIAN [id number]]  . amoxicillin-clavulanate (AUGMENTIN) 875-125 MG tablet    Sig: Take 1 tablet by mouth 2 (two) times daily.    Dispense:  14 tablet    Refill:  0    Order Specific Question:   Supervising Provider    Answer:   Mardella LaymanHAGLER, BRIAN [[id number]]    Reviewed expectations re: course of current medical issues. Questions answered. Outlined signs and symptoms indicating need for more acute intervention. Patient verbalized understanding. After Visit Summary given.   SUBJECTIVE:  Joseph LewisMatthew S Quarry is a 21 y.o. male who presents with complaint of puncture wound to his left 4th toe. States he stepped on a nail and needs a tetanus shot. This occurred earlier this morning. Denies fever, chills, redness, swelling, or red streaking. He is a diabetic, denies other history  ROS: As per HPI, remainder of ROS negative.   OBJECTIVE:  Vitals:   04/07/17 1747  BP: 132/78  Pulse: 78  Resp: 18  Temp: 98.6 F (37 C)  TempSrc: Oral  SpO2: 100%     General appearance: alert; no distress HEENT: normocephalic; atraumatic; conjunctivae normal;Extremities: no cyanosis or edema; symmetrical with no gross deformities Skin: warm and dry, small puncture wound to the plantar surface of the left 4th toe. Sensory function intact, no redness or swelling. Neurologic: Grossly normal Psychological:  alert and cooperative; normal mood and affect  Procedures:    Labs Reviewed - No data to display  No results found.  No Known  Allergies  PMHx, SurgHx, SocialHx, Medications, and Allergies were reviewed in the Visit Navigator and updated as appropriate.       Dorena BodoKennard, Jazmen Lindenbaum, NP 04/07/17 2022

## 2017-04-07 NOTE — ED Notes (Signed)
Patient called and failed in the lobby.

## 2017-06-11 ENCOUNTER — Emergency Department (HOSPITAL_COMMUNITY): Payer: Self-pay

## 2017-06-11 ENCOUNTER — Inpatient Hospital Stay (HOSPITAL_COMMUNITY)
Admission: EM | Admit: 2017-06-11 | Discharge: 2017-06-12 | DRG: 638 | Disposition: A | Discharge status: Home / Self Care | Payer: Self-pay | Attending: Internal Medicine | Admitting: Internal Medicine

## 2017-06-11 ENCOUNTER — Encounter (HOSPITAL_COMMUNITY): Payer: Self-pay

## 2017-06-11 DIAGNOSIS — E860 Dehydration: Secondary | ICD-10-CM | POA: Diagnosis present

## 2017-06-11 DIAGNOSIS — E861 Hypovolemia: Secondary | ICD-10-CM | POA: Diagnosis present

## 2017-06-11 DIAGNOSIS — R079 Chest pain, unspecified: Secondary | ICD-10-CM

## 2017-06-11 DIAGNOSIS — Z809 Family history of malignant neoplasm, unspecified: Secondary | ICD-10-CM

## 2017-06-11 DIAGNOSIS — E1010 Type 1 diabetes mellitus with ketoacidosis without coma: Principal | ICD-10-CM | POA: Diagnosis present

## 2017-06-11 DIAGNOSIS — F17290 Nicotine dependence, other tobacco product, uncomplicated: Secondary | ICD-10-CM | POA: Diagnosis present

## 2017-06-11 DIAGNOSIS — E872 Acidosis: Secondary | ICD-10-CM

## 2017-06-11 DIAGNOSIS — T383X6A Underdosing of insulin and oral hypoglycemic [antidiabetic] drugs, initial encounter: Secondary | ICD-10-CM | POA: Diagnosis present

## 2017-06-11 DIAGNOSIS — E875 Hyperkalemia: Secondary | ICD-10-CM | POA: Diagnosis present

## 2017-06-11 DIAGNOSIS — R000 Tachycardia, unspecified: Secondary | ICD-10-CM | POA: Diagnosis present

## 2017-06-11 DIAGNOSIS — Z794 Long term (current) use of insulin: Secondary | ICD-10-CM

## 2017-06-11 DIAGNOSIS — Z8619 Personal history of other infectious and parasitic diseases: Secondary | ICD-10-CM

## 2017-06-11 DIAGNOSIS — E109 Type 1 diabetes mellitus without complications: Secondary | ICD-10-CM | POA: Diagnosis present

## 2017-06-11 DIAGNOSIS — R0789 Other chest pain: Secondary | ICD-10-CM | POA: Diagnosis present

## 2017-06-11 DIAGNOSIS — F17210 Nicotine dependence, cigarettes, uncomplicated: Secondary | ICD-10-CM | POA: Diagnosis present

## 2017-06-11 DIAGNOSIS — R112 Nausea with vomiting, unspecified: Secondary | ICD-10-CM | POA: Diagnosis present

## 2017-06-11 DIAGNOSIS — Z720 Tobacco use: Secondary | ICD-10-CM

## 2017-06-11 DIAGNOSIS — E1110 Type 2 diabetes mellitus with ketoacidosis without coma: Secondary | ICD-10-CM | POA: Diagnosis present

## 2017-06-11 DIAGNOSIS — Z8249 Family history of ischemic heart disease and other diseases of the circulatory system: Secondary | ICD-10-CM

## 2017-06-11 DIAGNOSIS — E871 Hypo-osmolality and hyponatremia: Secondary | ICD-10-CM | POA: Diagnosis present

## 2017-06-11 DIAGNOSIS — N179 Acute kidney failure, unspecified: Secondary | ICD-10-CM | POA: Diagnosis present

## 2017-06-11 DIAGNOSIS — E8729 Other acidosis: Secondary | ICD-10-CM

## 2017-06-11 DIAGNOSIS — R739 Hyperglycemia, unspecified: Secondary | ICD-10-CM

## 2017-06-11 DIAGNOSIS — Z91128 Patient's intentional underdosing of medication regimen for other reason: Secondary | ICD-10-CM

## 2017-06-11 LAB — CBC
HEMATOCRIT: 41.1 % (ref 39.0–52.0)
HEMATOCRIT: 39.3 % (ref 39.0–52.0)
HEMOGLOBIN: 14.5 g/dL (ref 13.0–17.0)
HEMOGLOBIN: 13.6 g/dL (ref 13.0–17.0)
MCH: 29.8 pg (ref 26.0–34.0)
MCH: 29.3 pg (ref 26.0–34.0)
MCHC: 35.3 g/dL (ref 30.0–36.0)
MCHC: 34.6 g/dL (ref 30.0–36.0)
MCV: 84.4 fL (ref 78.0–100.0)
MCV: 84.7 fL (ref 78.0–100.0)
Platelets: 338 10*3/uL (ref 150–400)
Platelets: 339 10*3/uL (ref 150–400)
RBC: 4.87 MIL/uL (ref 4.22–5.81)
RBC: 4.64 MIL/uL (ref 4.22–5.81)
RDW: 12.1 % (ref 11.5–15.5)
RDW: 12.4 % (ref 11.5–15.5)
WBC: 8.4 10*3/uL (ref 4.0–10.5)
WBC: 23.7 10*3/uL — AB (ref 4.0–10.5)

## 2017-06-11 LAB — MAGNESIUM: Magnesium: 2 mg/dL (ref 1.7–2.4)

## 2017-06-11 LAB — CBG MONITORING, ED
GLUCOSE-CAPILLARY: 578 mg/dL — AB (ref 65–99)
Glucose-Capillary: 468 mg/dL — ABNORMAL HIGH (ref 65–99)
Glucose-Capillary: 392 mg/dL — ABNORMAL HIGH (ref 65–99)
Glucose-Capillary: 233 mg/dL — ABNORMAL HIGH (ref 65–99)
Glucose-Capillary: 281 mg/dL — ABNORMAL HIGH (ref 65–99)

## 2017-06-11 LAB — HEPATIC FUNCTION PANEL
ALK PHOS: 88 U/L (ref 38–126)
ALT: 14 U/L — AB (ref 17–63)
AST: 17 U/L (ref 15–41)
Albumin: 4.1 g/dL (ref 3.5–5.0)
BILIRUBIN TOTAL: 1.2 mg/dL (ref 0.3–1.2)
Total Protein: 7.4 g/dL (ref 6.5–8.1)

## 2017-06-11 LAB — I-STAT VENOUS BLOOD GAS, ED
ACID-BASE DEFICIT: 15 mmol/L — AB (ref 0.0–2.0)
Bicarbonate: 13.9 mmol/L — ABNORMAL LOW (ref 20.0–28.0)
O2 SAT: 97 %
PO2 VEN: 117 mmHg — AB (ref 32.0–45.0)
TCO2: 15 mmol/L — ABNORMAL LOW (ref 22–32)
pCO2, Ven: 43.6 mmHg — ABNORMAL LOW (ref 44.0–60.0)
pH, Ven: 7.111 — CL (ref 7.250–7.430)

## 2017-06-11 LAB — LIPASE, BLOOD: Lipase: 15 U/L (ref 11–51)

## 2017-06-11 LAB — URINALYSIS, ROUTINE W REFLEX MICROSCOPIC
BILIRUBIN URINE: NEGATIVE
Bacteria, UA: NONE SEEN
HGB URINE DIPSTICK: NEGATIVE
Ketones, ur: 80 mg/dL — AB
LEUKOCYTES UA: NEGATIVE
NITRITE: NEGATIVE
PH: 6 (ref 5.0–8.0)
Protein, ur: NEGATIVE mg/dL
SPECIFIC GRAVITY, URINE: 1.026 (ref 1.005–1.030)

## 2017-06-11 LAB — BASIC METABOLIC PANEL
Anion gap: 24 — ABNORMAL HIGH (ref 5–15)
Anion gap: 15 (ref 5–15)
BUN: 22 mg/dL — ABNORMAL HIGH (ref 6–20)
BUN: 17 mg/dL (ref 6–20)
CHLORIDE: 93 mmol/L — AB (ref 101–111)
CHLORIDE: 107 mmol/L (ref 101–111)
CO2: 13 mmol/L — ABNORMAL LOW (ref 22–32)
CO2: 13 mmol/L — ABNORMAL LOW (ref 22–32)
Calcium: 9.3 mg/dL (ref 8.9–10.3)
Calcium: 8.8 mg/dL — ABNORMAL LOW (ref 8.9–10.3)
Creatinine, Ser: 1.42 mg/dL — ABNORMAL HIGH (ref 0.61–1.24)
Creatinine, Ser: 1.22 mg/dL (ref 0.61–1.24)
GFR calc Af Amer: >60 mL/min (ref >60)
GFR calc non Af Amer: >60 mL/min (ref >60)
GLUCOSE: 568 mg/dL — AB (ref 65–99)
Glucose, Bld: 213 mg/dL — ABNORMAL HIGH (ref 65–99)
POTASSIUM: 5.2 mmol/L — AB (ref 3.5–5.1)
POTASSIUM: 4.4 mmol/L (ref 3.5–5.1)
SODIUM: 135 mmol/L (ref 135–145)
Sodium: 130 mmol/L — ABNORMAL LOW (ref 135–145)

## 2017-06-11 LAB — PHOSPHORUS: Phosphorus: 2.9 mg/dL (ref 2.5–4.6)

## 2017-06-11 LAB — HEMOGLOBIN A1C
HEMOGLOBIN A1C: 12.3 % — AB (ref 4.8–5.6)
MEAN PLASMA GLUCOSE: 306.31 mg/dL

## 2017-06-11 LAB — I-STAT TROPONIN, ED: TROPONIN I, POC: 0 ng/mL (ref 0.00–0.08)

## 2017-06-11 LAB — GLUCOSE, CAPILLARY
Glucose-Capillary: 128 mg/dL — ABNORMAL HIGH (ref 65–99)
Glucose-Capillary: 182 mg/dL — ABNORMAL HIGH (ref 65–99)
Glucose-Capillary: 222 mg/dL — ABNORMAL HIGH (ref 65–99)

## 2017-06-11 LAB — BETA-HYDROXYBUTYRIC ACID: BETA-HYDROXYBUTYRIC ACID: 6.17 mmol/L — AB (ref 0.05–0.27)

## 2017-06-11 LAB — TROPONIN I

## 2017-06-11 MED ORDER — SODIUM CHLORIDE 0.9 % IV BOLUS (SEPSIS)
1000.0000 mL | Freq: Once | INTRAVENOUS | Status: AC
  Administered 2017-06-11: 1000 mL via INTRAVENOUS

## 2017-06-11 MED ORDER — SODIUM CHLORIDE 0.9 % IV SOLN
INTRAVENOUS | Status: DC
  Administered 2017-06-11: 4.1 [IU]/h via INTRAVENOUS
  Filled 2017-06-11: qty 1

## 2017-06-11 MED ORDER — DEXTROSE-NACL 5-0.45 % IV SOLN
INTRAVENOUS | Status: DC
  Administered 2017-06-11: 22:00:00 via INTRAVENOUS

## 2017-06-11 MED ORDER — ONDANSETRON HCL 4 MG/2ML IJ SOLN
4.0000 mg | Freq: Once | INTRAMUSCULAR | Status: AC
  Administered 2017-06-11: 4 mg via INTRAVENOUS
  Filled 2017-06-11: qty 2

## 2017-06-11 MED ORDER — SODIUM CHLORIDE 0.9 % IV SOLN
INTRAVENOUS | Status: DC
  Administered 2017-06-11: 22:00:00 via INTRAVENOUS

## 2017-06-11 MED ORDER — DEXTROSE-NACL 5-0.45 % IV SOLN
INTRAVENOUS | Status: DC
  Administered 2017-06-11: 21:00:00 via INTRAVENOUS

## 2017-06-11 MED ORDER — ENOXAPARIN SODIUM 40 MG/0.4ML ~~LOC~~ SOLN: 40.0000 mg | SUBCUTANEOUS | Status: DC

## 2017-06-11 MED ORDER — SODIUM CHLORIDE 0.9 % IV SOLN
INTRAVENOUS | Status: DC
  Administered 2017-06-11: 4.9 [IU]/h via INTRAVENOUS
  Filled 2017-06-11: qty 1

## 2017-06-11 MED ORDER — ONDANSETRON HCL 4 MG/2ML IJ SOLN
4.0000 mg | Freq: Three times a day (TID) | INTRAMUSCULAR | Status: DC | PRN
  Administered 2017-06-11: 4 mg via INTRAVENOUS
  Filled 2017-06-11: qty 2

## 2017-06-11 MED ORDER — SODIUM CHLORIDE 0.9 % IV SOLN
INTRAVENOUS | Status: AC
  Administered 2017-06-11: 22:00:00 via INTRAVENOUS

## 2017-06-11 NOTE — ED Notes (Signed)
Attempted to call report

## 2017-06-11 NOTE — ED Provider Notes (Signed)
[place] [facility] DEPT Provider Note   CSN: [id number] Arrival date & time: 06/11/17  1340     History   Chief Complaint Chief Complaint  Patient presents with  . Chest Pain  . Hyperglycemia    HPI Joseph Barr is a 21 y.o. male.  HPI  21 year old male with a history of type 1 diabetes presents with hyperglycemia. He states his glucose typically runs around 100s. He takes 70/30. He gets this at [facility] and does not currently have a doctor or prescription. However he ran out 2 days ago. Yesterday his glucose was in the 200s and 300s. Today was in the 500s. This morning around 10 AM he developed some sharp chest pain in the middle this sternum. Worse with certain positions. Now has had 2 episodes of vomiting. No abdominal pain. No fevers.  Past Medical History:  Diagnosis Date  . Diabetes mellitus     Patient Active Problem List   Diagnosis Date Noted  . DKA (diabetic ketoacidoses) ([place]) 04/24/2015  . DKA, type 1 ([place]) 04/24/2015  . AKI (acute kidney injury) ([place]) 04/24/2015  . Nausea with vomiting 12/06/2014  . Diabetes mellitus type I ([place]) 01/21/2012  . Diabetes mellitus type 1 ([place]) 01/06/2012  . Hyperglycemia 01/06/2012  . Ketonuria 01/06/2012  . Headache(784.0) 01/06/2012    History reviewed. No pertinent surgical history.     Home Medications    Prior to Admission medications   Medication Sig Start Date End Date Taking? Authorizing Provider  amoxicillin-clavulanate (AUGMENTIN) 875-125 MG tablet Take 1 tablet by mouth 2 (two) times daily. 04/07/17   Barnet Glasgow, NP  Blood Glucose Monitoring Suppl (ACCU-CHEK AVIVA PLUS) W/DEVICE KIT 1 Device by Does not apply route once. 06/26/14   Renato Shin, MD  gemfibrozil (LOPID) 600 MG tablet Take 1 tablet (600 mg total) by mouth 2 (two) times daily before a meal. 08/04/16 11/02/16  Hairston, Toy Baker R, FNP  glucose blood (ACCU-CHEK AVIVA) test strip 1 each by Other route 4 (four) times daily. And lancets 250.03  06/26/14   Renato Shin, MD  insulin aspart protamine- aspart (NOVOLOG MIX 70/30) (70-30) 100 UNIT/ML injection Inject 45 Units into the skin 3 (three) times daily.    [provider]  Insulin Glargine (TOUJEO SOLOSTAR) 300 UNIT/ML SOPN Inject 50 Units into the skin every morning. Patient not taking: Reported on 08/01/2016 09/06/15   Renato Shin, MD  insulin lispro (HUMALOG) 100 UNIT/ML injection Inject 15 Units into the skin 3 (three) times daily before meals.     [provider]  Insulin Pen Needle 32G X 4 MM MISC Use 4 times daily 11/14/14   Renato Shin, MD  NOVOLOG 100 UNIT/ML injection INJECT 20 UNITS 3 TIMES A DAY WITH MEALS Patient not taking: Reported on 08/01/2016 08/08/15   Arnoldo Morale, MD    Family History Family History  Problem Relation Age of Onset  . Cancer Maternal Grandfather   . Hypertension Mother   . Diabetes Neg Hx     Social History Social History  Substance Use Topics  . Smoking status: Current Every Day Smoker    Types: E-cigarettes  . Smokeless tobacco: Never Used  . Alcohol use No     Allergies   Patient has no known allergies.   Review of Systems Review of Systems  Constitutional: Negative for fever.  Respiratory: Negative for shortness of breath.   Cardiovascular: Positive for chest pain.  Gastrointestinal: Positive for nausea and vomiting. Negative for abdominal pain.  All  other systems reviewed and are negative.    Physical Exam Updated Vital Signs BP 133/80   Pulse (!) 115   Temp 98.4 F (36.9 C) (Oral)   Resp (!) 21   Ht 6' (1.829 m)   Wt 65.8 kg (145 lb)   SpO2 98%   BMI 19.67 kg/m   Physical Exam  Constitutional: He is oriented to person, place, and time. He appears well-developed and well-nourished.  HENT:  Head: Normocephalic and atraumatic.  Right Ear: External ear normal.  Left Ear: External ear normal.  Nose: Nose normal.  Mouth/Throat: Mucous membranes are dry.  Eyes: Right eye exhibits no  discharge. Left eye exhibits no discharge.  Neck: Neck supple.  Cardiovascular: Regular rhythm and normal heart sounds.  Tachycardia present.   Pulmonary/Chest: Effort normal and breath sounds normal. He exhibits tenderness (over sternum).  Abdominal: Soft. He exhibits no distension. There is no tenderness.  Musculoskeletal: He exhibits no edema.  Neurological: He is alert and oriented to person, place, and time.  Skin: Skin is warm and dry.  Nursing note and vitals reviewed.    ED Treatments / Results  Labs (all labs ordered are listed, but only abnormal results are displayed) Labs Reviewed  BASIC METABOLIC PANEL - Abnormal; Notable for the following:       Result Value   Sodium 130 (*)    Potassium 5.2 (*)    Chloride 93 (*)    CO2 13 (*)    Glucose, Bld 568 (*)    BUN 22 (*)    Creatinine, Ser 1.42 (*)    Anion gap 24 (*)    All other components within normal limits  URINALYSIS, ROUTINE W REFLEX MICROSCOPIC - Abnormal; Notable for the following:    Color, Urine STRAW (*)    Glucose, UA >=500 (*)    Ketones, ur 80 (*)    Squamous Epithelial / LPF 0-5 (*)    All other components within normal limits  CBG MONITORING, ED - Abnormal; Notable for the following:    Glucose-Capillary 578 (*)    All other components within normal limits  I-STAT VENOUS BLOOD GAS, ED - Abnormal; Notable for the following:    pH, Ven 7.111 (*)    pCO2, Ven 43.6 (*)    pO2, Ven 117.0 (*)    Bicarbonate 13.9 (*)    TCO2 15 (*)    Acid-base deficit 15.0 (*)    All other components within normal limits  CBC  HEPATIC FUNCTION PANEL  LIPASE, BLOOD  I-STAT TROPONIN, ED  CBG MONITORING, ED    EKG  EKG Interpretation  Date/Time:  Thursday June 11 2017 13:52:24 EDT Ventricular Rate:  119 PR Interval:  124 QRS Duration: 82 QT Interval:  318 QTC Calculation: 447 R Axis:   91 Text Interpretation:  Sinus tachycardia Rightward axis Borderline ECG no significant change since 2016 Confirmed by  Sherwood Gambler [phone]) on 06/11/2017 3:18:17 PM       Radiology Dg Chest 2 View  Result Date: 06/11/2017 CLINICAL DATA:  Hyperglycemia, diabetes, shortness of breath and nausea and chest pain EXAM: CHEST  2 VIEW COMPARISON:  04/24/2015 FINDINGS: The heart size and mediastinal contours are within normal limits. Both lungs are clear. The visualized skeletal structures are unremarkable. IMPRESSION: No active cardiopulmonary disease. Electronically Signed   By: Jerilynn Mages.  Shick M.D.   On: 06/11/2017 14:23    Procedures .Critical Care Performed by: Sherwood Gambler Authorized by: Sherwood Gambler   Critical care provider statement:  Critical care time (minutes):  30   Critical care was necessary to treat or prevent imminent or life-threatening deterioration of the following conditions:  Renal failure, dehydration and endocrine crisis   Critical care was time spent personally by me on the following activities:  Evaluation of patient's response to treatment, examination of patient, discussions with consultants, ordering and performing treatments and interventions, ordering and review of laboratory studies, ordering and review of radiographic studies, pulse oximetry, re-evaluation of patient's condition, review of old charts and obtaining history from patient or surrogate    (including critical care time)  Medications Ordered in ED Medications  sodium chloride 0.9 % bolus 1,000 mL (not administered)  dextrose 5 %-0.45 % sodium chloride infusion (not administered)  insulin regular (NOVOLIN R,HUMULIN R) 100 Units in sodium chloride 0.9 % 100 mL (1 Units/mL) infusion (not administered)  ondansetron (ZOFRAN) injection 4 mg (4 mg Intravenous Given 06/11/17 1504)  sodium chloride 0.9 % bolus 1,000 mL (1,000 mLs Intravenous New Bag/Given 06/11/17 1504)     Initial Impression / Assessment and Plan / ED Course  I have reviewed the triage vital signs and the nursing notes.  Pertinent labs & imaging  results that were available during my care of the patient were reviewed by me and considered in my medical decision making (see chart for details).     Patient presents in DKA. However he is awake, alert, and besides appearing dehydrated does not appear ill. Unclear cause of his chest pain but it is likely musculoskeletal. Vomiting has stopped after Zofran which is likely caused by the DKA. He will need admission. He is mildly hyperkalemic but no EKG changes. Will start glucostabilizer but no potassium at this point. Admit to hospitalist.   Final Clinical Impressions(s) / ED Diagnoses   Final diagnoses:  Diabetic ketoacidosis without coma associated with type 1 diabetes mellitus ([place])    New Prescriptions New Prescriptions   No medications on file     Sherwood Gambler, MD 06/11/17 [id number]

## 2017-06-11 NOTE — ED Triage Notes (Signed)
Pt presents via [facility] for evaluation of hyperglycemia x 2 days due to lack of insulin at home. Pt also reports new onset central CP today, 7/10. Pt alert and oriented. Given 324 ASA, 1 nitro, 4 mg zofran and 1500 ml NS in route.

## 2017-06-11 NOTE — ED Notes (Signed)
Pt gagging and vomiting loudly; MD aware, orders received. MD bedside to evaluate pt at this time

## 2017-06-11 NOTE — ED Notes (Signed)
Pt states he was recently laid off a [employer] job, so he has not been able to afford his insulin and has not taken it for 2 days

## 2017-06-11 NOTE — ED Notes (Signed)
Got patient into a gown on there monitor patient is resting with call bell in reach

## 2017-06-11 NOTE — H&P (Signed)
History and Physical    Joseph Barr:[id number] DOB: [birth year] DOA: 06/11/2017  PCP: Lizbeth Bark, FNP   Patient coming from: Home  Chief Complaint: Nausea, Vomiting, Chest Pain, and Running out of Insulin  HPI: Joseph Barr is a 21 y.o. male with medical history significant of Insulin Dependent Diabetes Mellitus Type 1 (Since the age of 9), Hx of Shingles, Tobacco Abuse and other comorbids who presented to [facility] after he developed CP and started getting Nauseous and started vomiting. Patient states he ran out of his Insulin 2 days ago and started feeling bad last night in the middle of the night. This AM patient was still feeling off and developed Chest Pain and developed 30 minutes prior to calling EMS. He described the pain as substernal with no radiation, and reproducible on palpation. In the ambulance patient developed Nausea and vomiting. Patient states he has not been eating well after feeling bad. Denied any lightheadedness or dizziness. States he has been in DKA once before. [facility] was called to admit the patient for DKA 2/2 to running out of Insulin. Patient was actively vomiting when I went to examine him.    ED Course: Given 2 Liters of NS, Started on Insulin gtt, and had a VBG done.   Review of Systems: As per HPI otherwise 10 point review of systems negative. No Burning or discomfort in urine and no exposure to Sick Contacts.   Past Medical History:  Diagnosis Date  . Diabetes mellitus    SURGICAL HISTORY History reviewed. No pertinent surgical history.  SOCIAL HISTORY  reports that he has been smoking E-cigarettes.  He has never used smokeless tobacco. He reports that he uses drugs, including Marijuana. He reports that he does not drink alcohol.  ALLERGIES No Known Allergies  Family History  Problem Relation Age of Onset  . Cancer Maternal Grandfather   . Hypertension Mother   . Diabetes Neg Hx    Prior to Admission medications   Medication Sig Start  Date End Date Taking? Authorizing Provider  insulin aspart protamine- aspart (NOVOLOG MIX 70/30) (70-30) 100 UNIT/ML injection Inject 45 Units into the skin 3 (three) times daily.   Yes [provider]  amoxicillin-clavulanate (AUGMENTIN) 875-125 MG tablet Take 1 tablet by mouth 2 (two) times daily. Patient not taking: Reported on 06/11/2017 04/07/17   Dorena Bodo, NP  gemfibrozil (LOPID) 600 MG tablet Take 1 tablet (600 mg total) by mouth 2 (two) times daily before a meal. Patient not taking: Reported on 06/11/2017 08/04/16 11/02/16  Lizbeth Bark, FNP  glucose blood (ACCU-CHEK AVIVA) test strip 1 each by Other route 4 (four) times daily. And lancets 250.03 Patient not taking: Reported on 06/11/2017 06/26/14   Romero Belling, MD  Insulin Glargine (TOUJEO SOLOSTAR) 300 UNIT/ML SOPN Inject 50 Units into the skin every morning. Patient not taking: Reported on 08/01/2016 09/06/15   Romero Belling, MD  NOVOLOG 100 UNIT/ML injection INJECT 20 UNITS 3 TIMES A DAY WITH MEALS Patient not taking: Reported on 08/01/2016 08/08/15   Jaclyn Shaggy, MD   Physical Exam: Vitals:   06/11/17 1446 06/11/17 1500 06/11/17 1600 06/11/17 1630  BP:  133/80 110/62 (!) 107/59  Pulse:  (!) 115 (!) 105 (!) 112  Resp:  (!) Temp:      TempSrc:      SpO2:  98% 100% 99%  Weight: 65.8 kg (145 lb)     Height: 6' (1.829 m)  Constitutional: Thin Caucasian Male in NAD and appears uncomfortable and is actively vomiting Eyes: Lids and conjunctivae normal, sclerae anicteric  ENMT: External Ears, Nose appear normal. Grossly normal hearing. Mucous membranes are moist. Neck: Appears normal, supple, no cervical masses, normal ROM, no appreciable thyromegaly, no JVD Respiratory: Diminished to auscultation bilaterally, no wheezing, rales, rhonchi or crackles. Normal respiratory effort and patient is not tachypenic. No accessory muscle use.  Cardiovascular: Tachycardic Rate but normal Rhythm, no murmurs /  rubs / gallops. S1 and S2 auscultated. No extremity edema.  Abdomen: Soft, non-tender, non-distended. No masses palpated. No appreciable hepatosplenomegaly. Bowel sounds positive x4.  GU: Deferred. Musculoskeletal: No clubbing / cyanosis of digits/nails. No joint deformity upper and lower extremities. Good ROM, no contractures.  Skin: No rashes or lesions on a limited skin eval. No induration; Warm and dry. Has multiple tattoos.  Neurologic: CN 2-12 grossly intact with no focal deficits. Strength 5/5 in all 4. Romberg sign cerebellar reflexes not assessed.  Psychiatric: Normal judgment and insight. Alert and oriented x 3. Normal mood and appropriate affect.   Labs on Admission: I have personally reviewed following labs and imaging studies  CBC:  Recent Labs Lab 06/11/17 1356  WBC 8.4  HGB 14.5  HCT 41.1  MCV 84.4  PLT 338   Basic Metabolic Panel:  Recent Labs Lab 06/11/17 1356  NA 130*  K 5.2*  CL 93*  CO2 13*  GLUCOSE 568*  BUN 22*  CREATININE 1.42*  CALCIUM 9.3   GFR: Estimated Creatinine Clearance: 75.9 mL/min (A) (by C-G formula based on SCr of 1.42 mg/dL (H)). Liver Function Tests: No results for input(s): AST, ALT, ALKPHOS, BILITOT, PROT, ALBUMIN in the last 168 hours. No results for input(s): LIPASE, AMYLASE in the last 168 hours. No results for input(s): AMMONIA in the last 168 hours. Coagulation Profile: No results for input(s): INR, PROTIME in the last 168 hours. Cardiac Enzymes: No results for input(s): CKTOTAL, CKMB, CKMBINDEX, TROPONINI in the last 168 hours. BNP (last 3 results) No results for input(s): PROBNP in the last 8760 hours. HbA1C: No results for input(s): HGBA1C in the last 72 hours. CBG:  Recent Labs Lab 06/11/17 1353 06/11/17 1701  GLUCAP 578* 468*   Lipid Profile: No results for input(s): CHOL, HDL, LDLCALC, TRIG, CHOLHDL, LDLDIRECT in the last 72 hours. Thyroid Function Tests: No results for input(s): TSH, T4TOTAL, FREET4,  T3FREE, THYROIDAB in the last 72 hours. Anemia Panel: No results for input(s): VITAMINB12, FOLATE, FERRITIN, TIBC, IRON, RETICCTPCT in the last 72 hours. Urine analysis:    Component Value Date/Time   COLORURINE STRAW (A) 06/11/2017 1359   APPEARANCEUR CLEAR 06/11/2017 1359   LABSPEC 1.026 06/11/2017 1359   PHURINE 6.0 06/11/2017 1359   GLUCOSEU >=500 (A) 06/11/2017 1359   HGBUR NEGATIVE 06/11/2017 1359   BILIRUBINUR NEGATIVE 06/11/2017 1359   BILIRUBINUR Negative 08/01/2016 0950   KETONESUR 80 (A) 06/11/2017 1359   PROTEINUR NEGATIVE 06/11/2017 1359   UROBILINOGEN 0.2 08/01/2016 0950   UROBILINOGEN 0.2 04/24/2015 1305   NITRITE NEGATIVE 06/11/2017 1359   LEUKOCYTESUR NEGATIVE 06/11/2017 1359   Sepsis Labs: !!!!!!!!!!!!!!!!!!!!!!!!!!!!!!!!!!!!!!!!!!!! (procalcitonin:4,lacticidven:4) )No results found for this or any previous visit (from the past 240 hour(s)).   Radiological Exams on Admission: Dg Chest 2 View  Result Date: 06/11/2017 CLINICAL DATA:  Hyperglycemia, diabetes, shortness of breath and nausea and chest pain EXAM: CHEST  2 VIEW COMPARISON:  04/24/2015 FINDINGS: The heart size and mediastinal contours are within normal limits. Both lungs are clear. The  visualized skeletal structures are unremarkable. IMPRESSION: No active cardiopulmonary disease. Electronically Signed   By: Judie Petit.  Shick M.D.   On: 06/11/2017 14:23   EKG: Independently reviewed. Showed Sinus Tachycardia at a rate of 119 with mild LVH and no evidence of ST elevation on my interpretation.   Assessment/Plan Active Problems:   Diabetes mellitus type 1 (HCC)   Hyperglycemia   Nausea with vomiting   DKA (diabetic ketoacidoses) (HCC)   DKA, type 1 (HCC)   AKI (acute kidney injury) (HCC)   Hyperkalemia   Tobacco abuse   Chest pain   Increased anion gap metabolic acidosis  Diabetic Ketoacidosis -Place in SDU; Patient Ran out of Insulin 2 days ago -VBG showed 7.111/43.6/117.0/13.9/97.0% -Blood  Sugar was 568 on Admission with AG of 24 -Given 2 Liters of NS in ED and placed on Glucostabilizer  -Given an additional 1 Liter of Bolus -C/w Insulin gtt per DKA Protocol -Start NS at 125 mL/hr and transition to D5W 1/2 NS when BS was <250 mg/dL -Check BMP's Z6X x5 -Antiemetic's with IV mg q8hprn N/V, Refractory N/V -Check Ketones and UDS -Check HbA1c -NPO -Repeat CMP in AM   Anion Gap Metabolic Acidosis 2/2 to DKA -Patient's AG was 24; CO2 was 13 -C/w IVF Rehydration and Check BMP q4h  Pseudohyponatremia 2/2 to Hyperglycemia -Patient's Na+ was 130 and BS was 568 -C/w IVF Rehydration and repeat CMP in AM   AKI 2/2 to Dehydration and Hypovolemia from N/V and DKA -BUN/Cr was 22/1.42 -C/w IVF Rehydration with NS at 125 mL/hr -Repeat BMP q4h and Repeat CMP in AM  Hyperkalemia -Patient's K+ was 5.2 -Expect to Improve with IVF Rehydration and Insulin gtt -Repeat BMP q4h x5   Nausea and Vomiting -From DKA -C/w Antiemetics and IVF Rehydration -NPO  Chest Pain r/o ACS -Cycle Cardiac Troponin I x 3 -EKG showed Sinus Tach -Doubt ACS as CP is Reproducible  Tobacco Abuse -Patient smokes Cigarettes still -Smoking Cessastion counseling given  -Consider Nicotine Patch   DVT prophylaxis: Enoxaparin 40 mg sq q24h Code Status: FULL CODE Family Communication: No family present at bedside Disposition Plan: Likely Home when Medically Stable Consults called: None Admission status: Inpatient SDU  Severity of Illness: The appropriate patient status for this patient is INPATIENT. Inpatient status is judged to be reasonable and necessary in order to provide the required intensity of service to ensure the patient's safety. The patient's presenting symptoms, physical exam findings, and initial radiographic and laboratory data in the context of their chronic comorbidities is felt to place them at high risk for further clinical deterioration. Furthermore, it is not anticipated that the patient  will be medically stable for discharge from the hospital within 2 midnights of admission. The following factors support the patient status of inpatient.   " The patient's presenting symptoms include Chest Pain, Nausea and Vomiting. " The worrisome physical exam findings include Dry mucous Membranes. " The initial radiographic and laboratory data are worrisome because of Anion Gap Metabolic Acidosis. " The chronic co-morbidities include Tobacco Abuse and Hx of DKA.  * I certify that at the point of admission it is my clinical judgment that the patient will require inpatient hospital care spanning beyond 2 midnights from the point of admission due to high intensity of service, high risk for further deterioration and high frequency of surveillance required.Merlene Laughter, D.O. [facility] Pager [phone]  If 7PM-7AM, please contact night-coverage [email or web address] Password [email or web address]  06/11/2017, 5:41 PM

## 2017-06-12 LAB — BASIC METABOLIC PANEL
ANION GAP: 14 (ref 5–15)
Anion gap: 14 (ref 5–15)
Anion gap: 13 (ref 5–15)
BUN: 11 mg/dL (ref 6–20)
BUN: 9 mg/dL (ref 6–20)
BUN: 7 mg/dL (ref 6–20)
CALCIUM: 8.5 mg/dL — AB (ref 8.9–10.3)
CALCIUM: 8.7 mg/dL — AB (ref 8.9–10.3)
CHLORIDE: 106 mmol/L (ref 101–111)
CO2: 13 mmol/L — ABNORMAL LOW (ref 22–32)
CO2: 14 mmol/L — AB (ref 22–32)
CO2: 17 mmol/L — AB (ref 22–32)
CREATININE: 0.92 mg/dL (ref 0.61–1.24)
CREATININE: 0.98 mg/dL (ref 0.61–1.24)
Calcium: 8.3 mg/dL — ABNORMAL LOW (ref 8.9–10.3)
Chloride: 106 mmol/L (ref 101–111)
Chloride: 103 mmol/L (ref 101–111)
Creatinine, Ser: 1.04 mg/dL (ref 0.61–1.24)
GFR calc Af Amer: >60 mL/min (ref >60)
GFR calc non Af Amer: >60 mL/min (ref >60)
GLUCOSE: 172 mg/dL — AB (ref 65–99)
Glucose, Bld: 126 mg/dL — ABNORMAL HIGH (ref 65–99)
Glucose, Bld: 156 mg/dL — ABNORMAL HIGH (ref 65–99)
POTASSIUM: 4.4 mmol/L (ref 3.5–5.1)
Potassium: 3.8 mmol/L (ref 3.5–5.1)
Potassium: 3.8 mmol/L (ref 3.5–5.1)
SODIUM: 133 mmol/L — AB (ref 135–145)
Sodium: 134 mmol/L — ABNORMAL LOW (ref 135–145)
Sodium: 133 mmol/L — ABNORMAL LOW (ref 135–145)

## 2017-06-12 LAB — RAPID URINE DRUG SCREEN, HOSP PERFORMED
Amphetamines: NOT DETECTED
BARBITURATES: NOT DETECTED
BENZODIAZEPINES: NOT DETECTED
COCAINE: NOT DETECTED
OPIATES: NOT DETECTED
Tetrahydrocannabinol: NOT DETECTED

## 2017-06-12 LAB — GLUCOSE, CAPILLARY
GLUCOSE-CAPILLARY: 123 mg/dL — AB (ref 65–99)
GLUCOSE-CAPILLARY: 193 mg/dL — AB (ref 65–99)
GLUCOSE-CAPILLARY: 171 mg/dL — AB (ref 65–99)
GLUCOSE-CAPILLARY: 151 mg/dL — AB (ref 65–99)
GLUCOSE-CAPILLARY: 153 mg/dL — AB (ref 65–99)
GLUCOSE-CAPILLARY: 186 mg/dL — AB (ref 65–99)
GLUCOSE-CAPILLARY: 98 mg/dL (ref 65–99)
GLUCOSE-CAPILLARY: 254 mg/dL — AB (ref 65–99)
Glucose-Capillary: 108 mg/dL — ABNORMAL HIGH (ref 65–99)
Glucose-Capillary: 110 mg/dL — ABNORMAL HIGH (ref 65–99)
Glucose-Capillary: 121 mg/dL — ABNORMAL HIGH (ref 65–99)
Glucose-Capillary: 156 mg/dL — ABNORMAL HIGH (ref 65–99)
Glucose-Capillary: 157 mg/dL — ABNORMAL HIGH (ref 65–99)
Glucose-Capillary: 200 mg/dL — ABNORMAL HIGH (ref 65–99)
Glucose-Capillary: 214 mg/dL — ABNORMAL HIGH (ref 65–99)

## 2017-06-12 LAB — TROPONIN I: Troponin I: <0.03 ng/mL (ref <0.03)

## 2017-06-12 LAB — MRSA PCR SCREENING: MRSA by PCR: NEGATIVE

## 2017-06-12 MED ORDER — NICOTINE 14 MG/24HR TD PT24: 14.0000 mg | MEDICATED_PATCH | TRANSDERMAL | 0 refills | Status: DC

## 2017-06-12 MED ORDER — INSULIN GLARGINE 100 UNIT/ML ~~LOC~~ SOLN
30.0000 [IU] | SUBCUTANEOUS | Status: DC
  Administered 2017-06-12: 30 [IU] via SUBCUTANEOUS
  Filled 2017-06-12: qty 0.3

## 2017-06-12 MED ORDER — NICOTINE 14 MG/24HR TD PT24: 14.0000 mg | MEDICATED_PATCH | TRANSDERMAL | 0 refills | Status: AC

## 2017-06-12 MED ORDER — INSULIN ASPART PROT & ASPART (70-30 MIX) 100 UNIT/ML ~~LOC~~ SUSP: 45.0000 [IU] | Freq: Three times a day (TID) | SUBCUTANEOUS | 0 refills | Status: DC

## 2017-06-12 MED ORDER — INSULIN ASPART PROT & ASPART (70-30 MIX) 100 UNIT/ML ~~LOC~~ SUSP: 45.0000 [IU] | Freq: Three times a day (TID) | SUBCUTANEOUS | 11 refills | Status: DC

## 2017-06-12 MED ORDER — INSULIN ASPART 100 UNIT/ML ~~LOC~~ SOLN
0.0000 [IU] | Freq: Three times a day (TID) | SUBCUTANEOUS | Status: DC
  Administered 2017-06-12: 15 [IU] via SUBCUTANEOUS

## 2017-06-12 MED FILL — NICOTINE 14 MG/24HR PATCH: 14 | 28 days supply | Qty: 28 | Fill #0

## 2017-06-12 MED FILL — !NOVOLOG MIX 70/30 VIAL: 70-30/ML | 21 days supply | Qty: 30 | Fill #0

## 2017-06-12 NOTE — Discharge Summary (Addendum)
[facility] Discharge Summary   Patient: Joseph Barr WUJ:[id number]   PCP: Lizbeth Bark, FNP DOB: 1995/11/04   Date of admission: 06/11/2017   Date of discharge:  06/12/2017    Discharge Diagnoses:  Active Problems:   Diabetes mellitus type 1 (HCC)   Hyperglycemia   Nausea with vomiting   DKA (diabetic ketoacidoses) (HCC)   DKA, type 1 (HCC)   AKI (acute kidney injury) (HCC)   Hyperkalemia   Tobacco abuse   Chest pain   Increased anion gap metabolic acidosis   Admitted From: home Disposition:  home  Recommendations for Outpatient Follow-up:  1. Please follow up with PCP in 1 week   Follow-up Information    [facility]. Call on 06/15/2017.   Why:  Please call first thing Monday morning and request post discharge follow up appt Contact information: [address] [facility] [address] [phone]         Diet recommendation: carb modified diet  Activity: The patient is advised to gradually reintroduce usual activities.  Discharge Condition: good  Code Status: full code  History of present illness: As per the H and P dictated on admission, "Joseph Barr is a 21 y.o. male with medical history significant of Insulin Dependent Diabetes Mellitus Type 1 (Since the age of 49), Hx of Shingles, Tobacco Abuse and other comorbids who presented to [facility] after he developed CP and started getting Nauseous and started vomiting. Patient states he ran out of his Insulin 2 days ago and started feeling bad last night in the middle of the night. This AM patient was still feeling off and developed Chest Pain and developed 30 minutes prior to calling EMS. He described the pain as substernal with no radiation, and reproducible on palpation. In the ambulance patient developed Nausea and vomiting. Patient states he has not been eating well after feeling bad. Denied any lightheadedness or dizziness. States he has been in DKA once  before. [facility] was called to admit the patient for DKA 2/2 to running out of Insulin. Patient was actively vomiting when I went to examine him."  Hospital Course:  Summary of his active problems in the hospital is as following. Diabetic Ketoacidosis in type I DM Patient Ran out of Insulin 2 days ago -VBG showed 7.111/43.6/117.0/13.9/97.0% -Blood Sugar was 568 on Admission with AG of 24 -Given 2 Liters of NS in ED and placed on Glucostabilizer  -treated with IV Insulin gtt per DKA Protocol Anion gap closed and pt was able to eat oral diet.  Was given lantus and will resume novolog 45 unit tid at home tomorrow New prescription sent to [facility] pharmacy.   Pseudohyponatremia 2/2 to Hyperglycemia -Patient's Na+ was 130 and BS was 568 NORMAL   AKI 2/2 to Dehydration and Hypovolemia from N/V and DKA Resolved   Hyperkalemia Resolved   Nausea and Vomiting Resolved  Pt was able to eat 100% og his meals   Chest Pain r/o ACS Resolved, non cardiac EKG and troponin as well as tele unremarkable   Tobacco Abuse -Patient smokes Cigarettes still -Smoking Cessastion counseling given   All other chronic medical condition were stable during the hospitalization.  Patient was ambulatory without any assistance. On the day of the discharge the patient's vitals were stable, and no other acute medical condition were reported by patient. the patient was felt safe to be discharge at home with family.  Procedures and Results:  none   Consultations:  none  DISCHARGE MEDICATION: Current Discharge Medication List    START taking these medications   Details  nicotine (NICODERM CQ - DOSED IN MG/24 HOURS) 14 mg/24hr patch Place 1 patch (14 mg total) onto the skin daily. Qty: 30 patch, Refills: 0      CONTINUE these medications which have CHANGED   Details  insulin aspart protamine- aspart (NOVOLOG MIX 70/30) (70-30) 100 UNIT/ML injection Inject 0.45 mLs (45 Units total) into the skin 3 (three)  times daily. Qty: 10 mL, Refills: 11      CONTINUE these medications which have NOT CHANGED   Details  glucose blood (ACCU-CHEK AVIVA) test strip 1 each by Other route 4 (four) times daily. And lancets 250.03 Qty: 120 each, Refills: 12      STOP taking these medications     amoxicillin-clavulanate (AUGMENTIN) 875-125 MG tablet      gemfibrozil (LOPID) 600 MG tablet      Insulin Glargine (TOUJEO SOLOSTAR) 300 UNIT/ML SOPN      NOVOLOG 100 UNIT/ML injection        No Known Allergies Discharge Instructions    Diet Carb Modified    Complete by:  As directed    Discharge instructions    Complete by:  As directed    It is important that you read following instructions as well as go over your medication list with RN to help you understand your care after this hospitalization.  Discharge Instructions: Please follow-up with PCP in one week  Please request your primary care physician to go over all Hospital Tests and Procedure/Radiological results at the follow up,  Please get all Hospital records sent to your PCP by signing hospital release before you go home.   Do not take more than prescribed Pain, Sleep and Anxiety Medications. You were cared for by a hospitalist during your hospital stay. If you have any questions about your discharge medications or the care you received while you were in the hospital after you are discharged, you can call the unit and ask to speak with the hospitalist on call if the hospitalist that took care of you is not available.  Once you are discharged, your primary care physician will handle any further medical issues. Please note that NO REFILLS for any discharge medications will be authorized once you are discharged, as it is imperative that you return to your primary care physician (or establish a relationship with a primary care physician if you do not have one) for your aftercare needs so that they can reassess your need for medications and monitor your  lab values. You Must read complete instructions/literature along with all the possible adverse reactions/side effects for all the Medicines you take and that have been prescribed to you. Take any new Medicines after you have completely understood and accept all the possible adverse reactions/side effects. Wear Seat belts while driving. If you have smoked or chewed Tobacco in the last 2 yrs please stop smoking and/or stop any Recreational drug use.   Increase activity slowly    Complete by:  As directed      Discharge Exam: Filed Weights   06/11/17 1446 06/11/17 2050  Weight: 65.8 kg (145 lb) 60.7 kg (133 lb 12.8 oz)   Vitals:   06/12/17 1134 06/12/17 1528  BP: 107/68 113/73  Pulse:    Resp: 16 20  Temp: 98.7 F (37.1 C) 98.8 F (37.1 C)  SpO2: 100% 100%   General: Appear in no distress, no Rash; Oral Mucosa moist. Cardiovascular:  S1 and S2 Present, no Murmur, no JVD Respiratory: Bilateral Air entry present and Clear to Auscultation, no Crackles, no wheezes Abdomen: Bowel Sound present, Soft and no tenderness Extremities: no Pedal edema, no calf tenderness Neurology: Grossly no focal neuro deficit.  The results of significant diagnostics from this hospitalization (including imaging, microbiology, ancillary and laboratory) are listed below for reference.    Significant Diagnostic Studies: Dg Chest 2 View  Result Date: 06/11/2017 CLINICAL DATA:  Hyperglycemia, diabetes, shortness of breath and nausea and chest pain EXAM: CHEST  2 VIEW COMPARISON:  04/24/2015 FINDINGS: The heart size and mediastinal contours are within normal limits. Both lungs are clear. The visualized skeletal structures are unremarkable. IMPRESSION: No active cardiopulmonary disease. Electronically Signed   By: Judie Petit.  Shick M.D.   On: 06/11/2017 14:23    Microbiology: Recent Results (from the past 240 hour(s))  MRSA PCR Screening     Status: None   Collection Time: 06/11/17 11:54 PM  Result Value Ref Range  Status   MRSA by PCR NEGATIVE NEGATIVE Final    Comment:        The GeneXpert MRSA Assay (FDA approved for NASAL specimens only), is one component of a comprehensive MRSA colonization surveillance program. It is not intended to diagnose MRSA infection nor to guide or monitor treatment for MRSA infections.      Labs: CBC:  Recent Labs Lab 06/11/17 1356 06/11/17 2124  WBC 8.4 23.7*  HGB 14.5 13.6  HCT 41.1 39.3  MCV 84.4 84.7  PLT 338 339   Basic Metabolic Panel:  Recent Labs Lab 06/11/17 1356 06/11/17 2124 06/12/17 0253 06/12/17 0909 06/12/17 1341  NA 130* 135 133* 134* 133*  K 5.2* 4.4 4.4 3.8 3.8  CL 93* 107 106 106 103  CO2 13* 13* 13* 14* 17*  GLUCOSE 568* 213* 172* 126* 156*  BUN 22* CREATININE 1.42* 1.22 1.04 0.92 0.98  CALCIUM 9.3 8.8* 8.3* 8.5* 8.7*  MG  --  2.0  --   --   --   PHOS  --  2.9  --   --   --    Liver Function Tests:  Recent Labs Lab 06/11/17 2124  AST 17  ALT 14*  ALKPHOS 88  BILITOT 1.2  PROT 7.4  ALBUMIN 4.1    Recent Labs Lab 06/11/17 2124  LIPASE 15   No results for input(s): AMMONIA in the last 168 hours. Cardiac Enzymes:  Recent Labs Lab 06/11/17 2124 06/12/17 0253 06/12/17 0909  TROPONINI <0.03 <0.03 <0.03   BNP (last 3 results) No results for input(s): BNP in the last 8760 hours. CBG:  Recent Labs Lab 06/12/17 0902 06/12/17 0954 06/12/17 1057 06/12/17 1211 06/12/17 1345  GLUCAP 153* 186* 98 108* 156*   Time spent: 35 minutes  Signed:  PATEL, PRANAV  [facility]  06/12/2017, 4:01 PM

## 2017-06-12 NOTE — Progress Notes (Signed)
Pt. Received all discharge paperwork and displayed understanding of instructions. Will get medicine and make an appointment for this Monday.

## 2017-06-12 NOTE — Progress Notes (Signed)
Results for LOYDE, ORTH (MRN [id number]) as of 06/12/2017 10:31  Ref. Range 06/12/2017 04:43 06/12/2017 05:46 06/12/2017 06:48 06/12/2017 07:48 06/12/2017 09:02  Glucose-Capillary Latest Ref Range: 65 - 99 mg/dL 409 (H) 811 (H) 914 (H) 121 (H) 153 (H)  Spoke with patient about his diabetes.  States that he was diagnosed with Type 1 diabetes at the age of 26.  States that he lost his health insurance 6 months ago and has not seen a doctor since then.  States that he has been taking 70/30 Reli-on [employer] insulin 45 units TID since he lost his insurance.  Is not able to afford Toujeo and Humalog. States that he had an orange card at one time.  Does have a Freestyle home blood glucose meter and a few strips at home that he uses to check blood sugars. Spoke to case manager about this patient's situation.   Recommend Lantus 30 units daily(weight-based), Novolog SENSITIVE correction scale every 4 hours if NPO or TID & HS, and Novolog 3-4 units TID with meals as transition insulin dosages when transitioning off the insulin drip and CBGs within range and anion gap is closed. Will continue to monitor blood sugars while in the hospital.

## 2017-06-12 NOTE — Care Management Note (Addendum)
Case Management Note  Patient Details  Name: Joseph Barr MRN: [id number] Date of Birth: 12/19/1995  Subjective/Objective:    Pt admitted  N/v CP and DKA               Action/Plan:  PTA from home independent - states that work was the barrier for pt to see PCP and use clinic pharmacy, however pt states that he is no longer working and can easily get to clinic and pharmacy.    CM verified that pt remains active with [facility] (last visit was 2017).  CM contacted [facility] but was unable to set up appt this late in the week  - CM instructed pt to call [facility] on Monday am and request post discharge follow up appt.  CM confirmed with [facility] that pt can utilize clinic pharmacy immediately post discharge as pt is still considered active with clinic.     Expected Discharge Date:                  Expected Discharge Plan:  Home/Self Care  In-House Referral:     Discharge planning Services  CM Consult, [facility]  Post Acute Care Choice:    Choice offered to:     DME Arranged:    DME Agency:     HH Arranged:    HH Agency:     Status of Service:  In process, will continue to follow  If discussed at Long Length of Stay Meetings, dates discussed:    Additional Comments: CM provided pt MATCH letter in case he discharge after hours for clinic pharmacy Cherylann Parr, RN 06/12/2017, 11:43 AM

## 2017-06-26 ENCOUNTER — Ambulatory Visit: Payer: Self-pay | Attending: Family Medicine

## 2019-11-19 ENCOUNTER — Encounter (HOSPITAL_COMMUNITY): Payer: Self-pay

## 2019-11-19 ENCOUNTER — Inpatient Hospital Stay (HOSPITAL_COMMUNITY)
Admission: EM | Admit: 2019-11-19 | Discharge: 2019-11-22 | DRG: 871 | Disposition: A | Discharge status: Home / Self Care | Payer: Self-pay | Attending: Internal Medicine | Admitting: Internal Medicine

## 2019-11-19 ENCOUNTER — Emergency Department (HOSPITAL_COMMUNITY): Payer: Self-pay

## 2019-11-19 ENCOUNTER — Inpatient Hospital Stay (HOSPITAL_COMMUNITY): Payer: Self-pay

## 2019-11-19 DIAGNOSIS — Z794 Long term (current) use of insulin: Secondary | ICD-10-CM

## 2019-11-19 DIAGNOSIS — E8729 Other acidosis: Secondary | ICD-10-CM

## 2019-11-19 DIAGNOSIS — R739 Hyperglycemia, unspecified: Secondary | ICD-10-CM

## 2019-11-19 DIAGNOSIS — E872 Acidosis: Secondary | ICD-10-CM

## 2019-11-19 DIAGNOSIS — N289 Disorder of kidney and ureter, unspecified: Secondary | ICD-10-CM

## 2019-11-19 DIAGNOSIS — Z8249 Family history of ischemic heart disease and other diseases of the circulatory system: Secondary | ICD-10-CM

## 2019-11-19 DIAGNOSIS — K209 Esophagitis, unspecified without bleeding: Secondary | ICD-10-CM | POA: Diagnosis present

## 2019-11-19 DIAGNOSIS — Z20822 Contact with and (suspected) exposure to covid-19: Secondary | ICD-10-CM | POA: Diagnosis present

## 2019-11-19 DIAGNOSIS — F1210 Cannabis abuse, uncomplicated: Secondary | ICD-10-CM | POA: Diagnosis present

## 2019-11-19 DIAGNOSIS — E1110 Type 2 diabetes mellitus with ketoacidosis without coma: Secondary | ICD-10-CM | POA: Diagnosis present

## 2019-11-19 DIAGNOSIS — E1010 Type 1 diabetes mellitus with ketoacidosis without coma: Secondary | ICD-10-CM | POA: Diagnosis present

## 2019-11-19 DIAGNOSIS — Z22322 Carrier or suspected carrier of Methicillin resistant Staphylococcus aureus: Secondary | ICD-10-CM

## 2019-11-19 DIAGNOSIS — A084 Viral intestinal infection, unspecified: Secondary | ICD-10-CM | POA: Diagnosis present

## 2019-11-19 DIAGNOSIS — R112 Nausea with vomiting, unspecified: Secondary | ICD-10-CM

## 2019-11-19 DIAGNOSIS — A419 Sepsis, unspecified organism: Principal | ICD-10-CM | POA: Diagnosis present

## 2019-11-19 DIAGNOSIS — N179 Acute kidney failure, unspecified: Secondary | ICD-10-CM | POA: Diagnosis present

## 2019-11-19 DIAGNOSIS — Z7151 Drug abuse counseling and surveillance of drug abuser: Secondary | ICD-10-CM

## 2019-11-19 DIAGNOSIS — R6510 Systemic inflammatory response syndrome (SIRS) of non-infectious origin without acute organ dysfunction: Secondary | ICD-10-CM | POA: Diagnosis present

## 2019-11-19 DIAGNOSIS — F17290 Nicotine dependence, other tobacco product, uncomplicated: Secondary | ICD-10-CM | POA: Diagnosis present

## 2019-11-19 LAB — MAGNESIUM: Magnesium: 1.9 mg/dL (ref 1.7–2.4)

## 2019-11-19 LAB — CBC WITH DIFFERENTIAL/PLATELET
Abs Immature Granulocytes: 0.22 10*3/uL — ABNORMAL HIGH (ref 0.00–0.07)
Basophils Absolute: 0.1 10*3/uL (ref 0.0–0.1)
Basophils Relative: 0 %
Eosinophils Absolute: 0 10*3/uL (ref 0.0–0.5)
Eosinophils Relative: 0 %
HCT: 43.8 % (ref 39.0–52.0)
Hemoglobin: 15.4 g/dL (ref 13.0–17.0)
Immature Granulocytes: 1 %
Lymphocytes Relative: 5 %
Lymphs Abs: 0.8 10*3/uL (ref 0.7–4.0)
MCH: 28.7 pg (ref 26.0–34.0)
MCHC: 35.2 g/dL (ref 30.0–36.0)
MCV: 81.6 fL (ref 80.0–100.0)
Monocytes Absolute: 2.1 10*3/uL — ABNORMAL HIGH (ref 0.1–1.0)
Monocytes Relative: 13 %
Neutro Abs: 13.4 10*3/uL — ABNORMAL HIGH (ref 1.7–7.7)
Neutrophils Relative %: 81 %
Platelets: 286 10*3/uL (ref 150–400)
RBC: 5.37 MIL/uL (ref 4.22–5.81)
RDW: 12.1 % (ref 11.5–15.5)
WBC: 16.6 10*3/uL — ABNORMAL HIGH (ref 4.0–10.5)
nRBC: 0 % (ref 0.0–0.2)

## 2019-11-19 LAB — BLOOD GAS, VENOUS
Acid-base deficit: 2.4 mmol/L — ABNORMAL HIGH (ref 0.0–2.0)
Bicarbonate: 21.7 mmol/L (ref 20.0–28.0)
O2 Saturation: 87.9 %
Patient temperature: 98.6
pCO2, Ven: 37.5 mmHg — ABNORMAL LOW (ref 44.0–60.0)
pH, Ven: 7.38 (ref 7.250–7.430)
pO2, Ven: 55.8 mmHg — ABNORMAL HIGH (ref 32.0–45.0)

## 2019-11-19 LAB — PHOSPHORUS: Phosphorus: 2.6 mg/dL (ref 2.5–4.6)

## 2019-11-19 LAB — COMPREHENSIVE METABOLIC PANEL
ALT: 18 U/L (ref 0–44)
AST: 17 U/L (ref 15–41)
Albumin: 4.1 g/dL (ref 3.5–5.0)
Alkaline Phosphatase: 78 U/L (ref 38–126)
Anion gap: 19 — ABNORMAL HIGH (ref 5–15)
BUN: 24 mg/dL — ABNORMAL HIGH (ref 6–20)
CO2: 21 mmol/L — ABNORMAL LOW (ref 22–32)
Calcium: 9.2 mg/dL (ref 8.9–10.3)
Chloride: 89 mmol/L — ABNORMAL LOW (ref 98–111)
Creatinine, Ser: 1.39 mg/dL — ABNORMAL HIGH (ref 0.61–1.24)
GFR calc Af Amer: >60 mL/min (ref >60)
GFR calc non Af Amer: >60 mL/min (ref >60)
Glucose, Bld: 509 mg/dL (ref 70–99)
Potassium: 4.4 mmol/L (ref 3.5–5.1)
Sodium: 129 mmol/L — ABNORMAL LOW (ref 135–145)
Total Bilirubin: 1.4 mg/dL — ABNORMAL HIGH (ref 0.3–1.2)
Total Protein: 8.1 g/dL (ref 6.5–8.1)

## 2019-11-19 LAB — URINALYSIS, ROUTINE W REFLEX MICROSCOPIC
Bacteria, UA: NONE SEEN
Bilirubin Urine: NEGATIVE
Glucose, UA: >=500 mg/dL — AB
Ketones, ur: 80 mg/dL — AB
Leukocytes,Ua: NEGATIVE
Nitrite: NEGATIVE
Protein, ur: NEGATIVE mg/dL
Specific Gravity, Urine: 1.024 (ref 1.005–1.030)
pH: 6 (ref 5.0–8.0)

## 2019-11-19 LAB — BASIC METABOLIC PANEL
Anion gap: 11 (ref 5–15)
Anion gap: 12 (ref 5–15)
Anion gap: 14 (ref 5–15)
BUN: 17 mg/dL (ref 6–20)
BUN: 12 mg/dL (ref 6–20)
BUN: 10 mg/dL (ref 6–20)
CO2: 24 mmol/L (ref 22–32)
CO2: 25 mmol/L (ref 22–32)
CO2: 24 mmol/L (ref 22–32)
Calcium: 8.6 mg/dL — ABNORMAL LOW (ref 8.9–10.3)
Calcium: 8.4 mg/dL — ABNORMAL LOW (ref 8.9–10.3)
Calcium: 8.9 mg/dL (ref 8.9–10.3)
Chloride: 101 mmol/L (ref 98–111)
Chloride: 99 mmol/L (ref 98–111)
Chloride: 101 mmol/L (ref 98–111)
Creatinine, Ser: 0.97 mg/dL (ref 0.61–1.24)
Creatinine, Ser: 0.78 mg/dL (ref 0.61–1.24)
Creatinine, Ser: 0.77 mg/dL (ref 0.61–1.24)
GFR calc Af Amer: >60 mL/min (ref >60)
GFR calc Af Amer: >60 mL/min (ref >60)
GFR calc Af Amer: >60 mL/min (ref >60)
GFR calc non Af Amer: >60 mL/min (ref >60)
GFR calc non Af Amer: >60 mL/min (ref >60)
GFR calc non Af Amer: >60 mL/min (ref >60)
Glucose, Bld: 227 mg/dL — ABNORMAL HIGH (ref 70–99)
Glucose, Bld: 141 mg/dL — ABNORMAL HIGH (ref 70–99)
Glucose, Bld: 135 mg/dL — ABNORMAL HIGH (ref 70–99)
Potassium: 3.7 mmol/L (ref 3.5–5.1)
Potassium: 3.5 mmol/L (ref 3.5–5.1)
Potassium: 3.5 mmol/L (ref 3.5–5.1)
Sodium: 136 mmol/L (ref 135–145)
Sodium: 136 mmol/L (ref 135–145)
Sodium: 139 mmol/L (ref 135–145)

## 2019-11-19 LAB — BETA-HYDROXYBUTYRIC ACID
Beta-Hydroxybutyric Acid: 5.17 mmol/L — ABNORMAL HIGH (ref 0.05–0.27)
Beta-Hydroxybutyric Acid: 3.13 mmol/L — ABNORMAL HIGH (ref 0.05–0.27)
Beta-Hydroxybutyric Acid: 4.48 mmol/L — ABNORMAL HIGH (ref 0.05–0.27)

## 2019-11-19 LAB — RAPID URINE DRUG SCREEN, HOSP PERFORMED
Amphetamines: NOT DETECTED
Barbiturates: NOT DETECTED
Benzodiazepines: NOT DETECTED
Cocaine: NOT DETECTED
Opiates: NOT DETECTED
Tetrahydrocannabinol: POSITIVE — AB

## 2019-11-19 LAB — LIPASE, BLOOD: Lipase: 12 U/L (ref 11–51)

## 2019-11-19 LAB — LACTIC ACID, PLASMA
Lactic Acid, Venous: 2.3 mmol/L (ref 0.5–1.9)
Lactic Acid, Venous: 1.4 mmol/L (ref 0.5–1.9)

## 2019-11-19 LAB — CBG MONITORING, ED
Glucose-Capillary: 114 mg/dL — ABNORMAL HIGH (ref 70–99)
Glucose-Capillary: 125 mg/dL — ABNORMAL HIGH (ref 70–99)
Glucose-Capillary: 128 mg/dL — ABNORMAL HIGH (ref 70–99)
Glucose-Capillary: 129 mg/dL — ABNORMAL HIGH (ref 70–99)
Glucose-Capillary: 136 mg/dL — ABNORMAL HIGH (ref 70–99)
Glucose-Capillary: 167 mg/dL — ABNORMAL HIGH (ref 70–99)
Glucose-Capillary: 180 mg/dL — ABNORMAL HIGH (ref 70–99)
Glucose-Capillary: 232 mg/dL — ABNORMAL HIGH (ref 70–99)
Glucose-Capillary: 384 mg/dL — ABNORMAL HIGH (ref 70–99)
Glucose-Capillary: 526 mg/dL (ref 70–99)

## 2019-11-19 LAB — PROTIME-INR
INR: 1.1 (ref 0.8–1.2)
Prothrombin Time: 14.5 seconds (ref 11.4–15.2)

## 2019-11-19 LAB — SARS CORONAVIRUS 2 (TAT 6-24 HRS): SARS Coronavirus 2: NEGATIVE

## 2019-11-19 LAB — PROCALCITONIN: Procalcitonin: 2.81 ng/mL

## 2019-11-19 LAB — APTT: aPTT: 31 seconds (ref 24–36)

## 2019-11-19 LAB — D-DIMER, QUANTITATIVE: D-Dimer, Quant: 2.01 ug/mL-FEU — ABNORMAL HIGH (ref 0.00–0.50)

## 2019-11-19 LAB — HEMOGLOBIN A1C
Hgb A1c MFr Bld: 11.6 % — ABNORMAL HIGH (ref 4.8–5.6)
Mean Plasma Glucose: 286.22 mg/dL

## 2019-11-19 LAB — GLUCOSE, CAPILLARY: Glucose-Capillary: 218 mg/dL — ABNORMAL HIGH (ref 70–99)

## 2019-11-19 MED ORDER — DEXTROSE-NACL 5-0.45 % IV SOLN: INTRAVENOUS | Status: DC

## 2019-11-19 MED ORDER — INSULIN ASPART 100 UNIT/ML IV SOLN
10.0000 [IU] | Freq: Once | INTRAVENOUS | Status: AC
  Administered 2019-11-19: 10 [IU] via INTRAVENOUS
  Filled 2019-11-19: qty 0.1

## 2019-11-19 MED ORDER — SODIUM CHLORIDE 0.9 % IV SOLN
2.0000 g | Freq: Once | INTRAVENOUS | Status: AC
  Administered 2019-11-19: 2 g via INTRAVENOUS
  Filled 2019-11-19: qty 2

## 2019-11-19 MED ORDER — INSULIN REGULAR(HUMAN) IN NACL 100-0.9 UT/100ML-% IV SOLN
INTRAVENOUS | Status: DC
  Administered 2019-11-19: 8 [IU]/h via INTRAVENOUS
  Filled 2019-11-19: qty 100

## 2019-11-19 MED ORDER — VANCOMYCIN HCL 750 MG/150ML IV SOLN
750.0000 mg | Freq: Two times a day (BID) | INTRAVENOUS | Status: DC
  Administered 2019-11-19 – 2019-11-20 (×2): 750 mg via INTRAVENOUS
  Filled 2019-11-19 (×3): qty 150

## 2019-11-19 MED ORDER — PANTOPRAZOLE SODIUM 40 MG IV SOLR
40.0000 mg | Freq: Two times a day (BID) | INTRAVENOUS | Status: DC
  Administered 2019-11-19 – 2019-11-22 (×7): 40 mg via INTRAVENOUS
  Filled 2019-11-19 (×7): qty 40

## 2019-11-19 MED ORDER — INSULIN ASPART 100 UNIT/ML ~~LOC~~ SOLN
3.0000 [IU] | Freq: Three times a day (TID) | SUBCUTANEOUS | Status: DC
  Administered 2019-11-19 (×3): 3 [IU] via SUBCUTANEOUS
  Filled 2019-11-19: qty 0.03

## 2019-11-19 MED ORDER — POTASSIUM CHLORIDE 10 MEQ/100ML IV SOLN: 10.0000 meq | INTRAVENOUS | Status: AC

## 2019-11-19 MED ORDER — IOHEXOL 350 MG/ML SOLN
100.0000 mL | Freq: Once | INTRAVENOUS | Status: AC | PRN
  Administered 2019-11-19: 100 mL via INTRAVENOUS

## 2019-11-19 MED ORDER — SODIUM CHLORIDE 0.9 % IV SOLN: INTRAVENOUS | Status: DC

## 2019-11-19 MED ORDER — ONDANSETRON HCL 4 MG/2ML IJ SOLN
4.0000 mg | Freq: Four times a day (QID) | INTRAMUSCULAR | Status: DC | PRN
  Administered 2019-11-19 (×3): 4 mg via INTRAVENOUS
  Filled 2019-11-19 (×3): qty 2

## 2019-11-19 MED ORDER — ENOXAPARIN SODIUM 40 MG/0.4ML ~~LOC~~ SOLN
40.0000 mg | SUBCUTANEOUS | Status: DC
  Administered 2019-11-19 – 2019-11-22 (×4): 40 mg via SUBCUTANEOUS
  Filled 2019-11-19 (×4): qty 0.4

## 2019-11-19 MED ORDER — LACTATED RINGERS IV BOLUS (SEPSIS)
1000.0000 mL | Freq: Once | INTRAVENOUS | Status: AC
  Administered 2019-11-19: 1000 mL via INTRAVENOUS

## 2019-11-19 MED ORDER — ONDANSETRON HCL 4 MG/2ML IJ SOLN
4.0000 mg | Freq: Once | INTRAMUSCULAR | Status: AC
  Administered 2019-11-19: 4 mg via INTRAVENOUS
  Filled 2019-11-19: qty 2

## 2019-11-19 MED ORDER — DEXTROSE 50 % IV SOLN: 0.0000 mL | INTRAVENOUS | Status: DC | PRN

## 2019-11-19 MED ORDER — PROMETHAZINE HCL 25 MG/ML IJ SOLN
12.5000 mg | INTRAMUSCULAR | Status: DC | PRN
  Administered 2019-11-19 – 2019-11-20 (×4): 12.5 mg via INTRAVENOUS
  Filled 2019-11-19 (×4): qty 1

## 2019-11-19 MED ORDER — METRONIDAZOLE IN NACL 5-0.79 MG/ML-% IV SOLN
500.0000 mg | Freq: Three times a day (TID) | INTRAVENOUS | Status: DC
  Administered 2019-11-19 – 2019-11-22 (×10): 500 mg via INTRAVENOUS
  Filled 2019-11-19 (×10): qty 100

## 2019-11-19 MED ORDER — SODIUM CHLORIDE (PF) 0.9 % IJ SOLN
INTRAMUSCULAR | Status: AC
  Filled 2019-11-19: qty 50

## 2019-11-19 MED ORDER — INSULIN GLARGINE 100 UNIT/ML ~~LOC~~ SOLN
20.0000 [IU] | SUBCUTANEOUS | Status: DC
  Administered 2019-11-19 – 2019-11-20 (×2): 20 [IU] via SUBCUTANEOUS
  Filled 2019-11-19 (×5): qty 0.2

## 2019-11-19 MED ORDER — INSULIN REGULAR(HUMAN) IN NACL 100-0.9 UT/100ML-% IV SOLN: INTRAVENOUS | Status: DC

## 2019-11-19 MED ORDER — SODIUM CHLORIDE 0.9 % IV BOLUS
1000.0000 mL | Freq: Once | INTRAVENOUS | Status: AC
  Administered 2019-11-19: 1000 mL via INTRAVENOUS

## 2019-11-19 MED ORDER — METRONIDAZOLE IN NACL 5-0.79 MG/ML-% IV SOLN
500.0000 mg | Freq: Once | INTRAVENOUS | Status: AC
  Administered 2019-11-19: 500 mg via INTRAVENOUS
  Filled 2019-11-19: qty 100

## 2019-11-19 MED ORDER — SODIUM CHLORIDE 0.9 % IV SOLN
2.0000 g | Freq: Three times a day (TID) | INTRAVENOUS | Status: DC
  Administered 2019-11-19 – 2019-11-22 (×10): 2 g via INTRAVENOUS
  Filled 2019-11-19 (×14): qty 2

## 2019-11-19 MED ORDER — INSULIN ASPART 100 UNIT/ML ~~LOC~~ SOLN
0.0000 [IU] | Freq: Three times a day (TID) | SUBCUTANEOUS | Status: DC
  Administered 2019-11-19 (×2): 1 [IU] via SUBCUTANEOUS
  Administered 2019-11-19: 2 [IU] via SUBCUTANEOUS
  Administered 2019-11-20: 7 [IU] via SUBCUTANEOUS
  Filled 2019-11-19: qty 0.09

## 2019-11-19 MED ORDER — VANCOMYCIN HCL 1500 MG/300ML IV SOLN
1500.0000 mg | Freq: Once | INTRAVENOUS | Status: AC
  Administered 2019-11-19: 1500 mg via INTRAVENOUS
  Filled 2019-11-19: qty 300

## 2019-11-19 MED ORDER — INSULIN ASPART 100 UNIT/ML ~~LOC~~ SOLN
0.0000 [IU] | Freq: Every day | SUBCUTANEOUS | Status: DC
  Administered 2019-11-19: 2 [IU] via SUBCUTANEOUS
  Filled 2019-11-19: qty 0.05

## 2019-11-19 MED ORDER — VANCOMYCIN HCL IN DEXTROSE 1-5 GM/200ML-% IV SOLN: 1000.0000 mg | Freq: Once | INTRAVENOUS | Status: DC

## 2019-11-19 NOTE — ED Notes (Signed)
Pt lying in bed, eye's closed, chest rising and falling. Will continue to monitor.  

## 2019-11-19 NOTE — Progress Notes (Signed)
A consult was received from an ED physician for cefepime and vancomycin per pharmacy dosing.  The patient's profile has been reviewed for ht/wt/allergies/indication/available labs.   A one time order has been placed for cefepime 2 gm and Vancomycin 1500 mg.  Further antibiotics/pharmacy consults should be ordered by admitting physician if indicated.                       Thank you, Lorenza Evangelist 11/19/2019  3:04 AM

## 2019-11-19 NOTE — Progress Notes (Signed)
PROGRESS NOTE  Joseph Barr TSV:[id number] DOB: [birth year] DOA: 11/19/2019 PCP: Alfonse Spruce, FNP  HPI/Recap of past 24 hours: HPI from Dr Carlyle Basques is a 24 y.o. male with medical history significant for type 1 diabetes mellitus, diagnosed at age 7, now presenting to the emergency department with 4 days of abdominal discomfort, nausea, vomiting, loose stools. Upon arrival to the ED, patient is found to be febrile to 38 C, saturating mid 90s on room air, tachycardic to the 140s, and with stable blood pressure.  EKG features sinus tachycardia with rate 142 and PVC.  Chest x-ray is a normal study.  Chemistry panel serum glucose 509, bicarbonate 21, anion gap 19, and creatinine 1.39, up from 0.98 in 2018.  CBC with leukocytosis to 16,600.  Lactic acid is 2.3.  Beta hydroxybutyrate is elevated to 5.17. Urinalysis notable for ketones.  Blood and urine cultures were collected, patient was started on insulin infusion, cefepime, vancomycin, and Flagyl.  COVID-19 PCR test is negative.  Patient admitted for further management.    Today, patient still complains of nausea, vomiting, denies any further diarrhea.  Denies any worsening abdominal pain, chest pain, shortness of breath.  Patient noted to still be significantly tachycardic.  Patient reports family members with similar condition.     Assessment/Plan: Principal Problem:   DKA (diabetic ketoacidoses) (HCC) Active Problems:   SIRS (systemic inflammatory response syndrome) (HCC)   Renal insufficiency   DKA/type 1 diabetes mellitus A1c 11.6 On presentation, serum glucose 509, elevated anion gap, low bicarb, urine ketones positive Status post insulin drip, switch to subcu insulin SSI, Lantus, NovoLog 3 times daily, Accu-Cheks, hypoglycemic protocol Diabetes coordinator on board Monitor closely in SDU  Sepsis Unknown etiology, ??gastroenteritis  Febrile, leukocytosis, tachycardic, lactic acidosis on admission Currently  afebrile, with leukocytosis UA unremarkable for infection, UC pending BC x2 pending Procalcitonin elevated at 2.81 CT A/P/chest negative for PE, noted groundglass opacity within the bilateral lung bases, which may represent infectious or inflammatory process, esophagitis Continue IV fluids Continue IV cefepime, Flagyl, vancomycin (MRSA PCR pending)  Esophagitis IV Protonix twice daily  AKI Resolved  Marijuana abuse Advised to quit ??Cyclical vomiting       Malnutrition Type:      Malnutrition Characteristics:      Nutrition Interventions:       Estimated body mass index is 19.67 kg/m as calculated from the following:   Height as of this encounter: 6' (1.829 m).   Weight as of this encounter: 65.8 kg.     Code Status: Full  Family Communication: Discussed with patient extensively  Disposition Plan: Patient still requiring IV antibiotics, IV fluids, plan for discharge in 24 to 48 hours once work-up complete and patient able to tolerate orally adequately.   Consultants:  None  Procedures:  None  Antimicrobials:  Cefepime  Flagyl  Vancomycin  DVT prophylaxis: Lovenox   Objective: Vitals:   11/19/19 1300 11/19/19 1330 11/19/19 1400 11/19/19 1430  BP: 108/70 114/70 103/65 107/71  Pulse: (!) 111 (!) 113 (!) 115 (!) 115  Resp: 16 17 18 19   Temp:      TempSrc:      SpO2: 97% 98% 98% 98%  Weight:      Height:        Intake/Output Summary (Last 24 hours) at 11/19/2019 1500 Last data filed at 11/19/2019 1030 Gross per 24 hour  Intake 4109.8 ml  Output --  Net 4109.8 ml   [employer]  11/19/19 0117  Weight: 65.8 kg    Exam:  General: NAD, acutely ill-appearing  Cardiovascular: S1, S2 present  Respiratory: CTAB  Abdomen: Soft, nontender, nondistended, bowel sounds present  Musculoskeletal: No bilateral pedal edema noted  Skin: Normal  Psychiatry: Normal mood   Data Reviewed: CBC: Recent Labs  Lab 11/19/19 0100   WBC 16.6*  NEUTROABS 13.4*  HGB 15.4  HCT 43.8  MCV 81.6  PLT 286   Basic Metabolic Panel: Recent Labs  Lab 11/19/19 0100 11/19/19 0520 11/19/19 1237  NA 129* 136 136  K 4.4 3.7 3.5  CL 89* 101 99  CO2 21* 24 25  GLUCOSE 509* 227* 141*  BUN 24* 17 12  CREATININE 1.39* 0.97 0.78  CALCIUM 9.2 8.6* 8.4*  MG 1.9  --   --   PHOS 2.6  --   --    GFR: Estimated Creatinine Clearance: 132.5 mL/min (by C-G formula based on SCr of 0.78 mg/dL). Liver Function Tests: Recent Labs  Lab 11/19/19 0100  AST 17  ALT 18  ALKPHOS 78  BILITOT 1.4*  PROT 8.1  ALBUMIN 4.1   Recent Labs  Lab 11/19/19 0100  LIPASE 12   No results for input(s): AMMONIA in the last 168 hours. Coagulation Profile: Recent Labs  Lab 11/19/19 0100  INR 1.1   Cardiac Enzymes: No results for input(s): CKTOTAL, CKMB, CKMBINDEX, TROPONINI in the last 168 hours. BNP (last 3 results) No results for input(s): PROBNP in the last 8760 hours. HbA1C: Recent Labs    11/19/19 0822  HGBA1C 11.6*   CBG: Recent Labs  Lab 11/19/19 0811 11/19/19 0924 11/19/19 1011 11/19/19 1118 11/19/19 1228  GLUCAP 167* 128* 114* 129* 125*   Lipid Profile: No results for input(s): CHOL, HDL, LDLCALC, TRIG, CHOLHDL, LDLDIRECT in the last 72 hours. Thyroid Function Tests: No results for input(s): TSH, T4TOTAL, FREET4, T3FREE, THYROIDAB in the last 72 hours. Anemia Panel: No results for input(s): VITAMINB12, FOLATE, FERRITIN, TIBC, IRON, RETICCTPCT in the last 72 hours. Urine analysis:    Component Value Date/Time   COLORURINE STRAW (A) 11/19/2019 0100   APPEARANCEUR CLEAR 11/19/2019 0100   LABSPEC 1.024 11/19/2019 0100   PHURINE 6.0 11/19/2019 0100   GLUCOSEU >=500 (A) 11/19/2019 0100   HGBUR SMALL (A) 11/19/2019 0100   BILIRUBINUR NEGATIVE 11/19/2019 0100   BILIRUBINUR Negative 08/01/2016 0950   KETONESUR 80 (A) 11/19/2019 0100   PROTEINUR NEGATIVE 11/19/2019 0100   UROBILINOGEN 0.2 08/01/2016 0950    UROBILINOGEN 0.2 04/24/2015 1305   NITRITE NEGATIVE 11/19/2019 0100   LEUKOCYTESUR NEGATIVE 11/19/2019 0100   Sepsis Labs: @LABRCNTIP (procalcitonin:4,lacticidven:4)  ) Recent Results (from the past 240 hour(s))  SARS CORONAVIRUS 2 (TAT 6-24 HRS) Nasopharyngeal Nasopharyngeal Swab     Status: None   Collection Time: 11/19/19  1:07 AM   Specimen: Nasopharyngeal Swab  Result Value Ref Range Status   SARS Coronavirus 2 NEGATIVE NEGATIVE Final    Comment: (NOTE) SARS-CoV-2 target nucleic acids are NOT DETECTED. The SARS-CoV-2 RNA is generally detectable in upper and lower respiratory specimens during the acute phase of infection. Negative results do not preclude SARS-CoV-2 infection, do not rule out co-infections with other pathogens, and should not be used as the sole basis for treatment or other patient management decisions. Negative results must be combined with clinical observations, patient history, and epidemiological information. The expected result is Negative. Fact Sheet for Patients: 11/21/19 Fact Sheet for Healthcare Providers: HairSlick.no This test is not yet approved or cleared by the quierodirigir.com  States FDA and  has been authorized for detection and/or diagnosis of SARS-CoV-2 by FDA under an Emergency Use Authorization (EUA). This EUA will remain  in effect (meaning this test can be used) for the duration of the COVID-19 declaration under Section 56 4(b)(1) of the Act, 21 U.S.C. section 360bbb-3(b)(1), unless the authorization is terminated or revoked sooner. Performed at [facility], [address] [place], [place] [address]   Blood Culture (routine x 2)     Status: None (Preliminary result)   Collection Time: 11/19/19  1:25 AM   Specimen: BLOOD  Result Value Ref Range Status   Specimen Description   Final    BLOOD RIGHT ANTECUBITAL Performed at [facility], [address] [place],  [place] [address]    Special Requests   Final    BOTTLES DRAWN AEROBIC AND ANAEROBIC Blood Culture adequate volume Performed at [facility], [address] [place], [place] [address]    Culture PENDING  Incomplete   Report Status PENDING  Incomplete  Blood Culture (routine x 2)     Status: None (Preliminary result)   Collection Time: 11/19/19  1:40 AM   Specimen: BLOOD  Result Value Ref Range Status   Specimen Description   Final    BLOOD BLOOD LEFT FOREARM Performed at [facility], [address] [place], [place] [address]    Special Requests   Final    BOTTLES DRAWN AEROBIC AND ANAEROBIC Blood Culture results may not be optimal due to an inadequate volume of blood received in culture bottles Performed at [facility], [address] [place], [place] [address]    Culture PENDING  Incomplete   Report Status PENDING  Incomplete      Studies: CT ANGIO CHEST PE W OR WO CONTRAST  Result Date: 11/19/2019 CLINICAL DATA:  Shortness of breath, diffuse abdominal pain, nausea and vomiting EXAM: CT ANGIOGRAPHY CHEST CT ABDOMEN AND PELVIS WITH CONTRAST TECHNIQUE: Multidetector CT imaging of the chest was performed using the standard protocol during bolus administration of intravenous contrast. Multiplanar CT image reconstructions and MIPs were obtained to evaluate the vascular anatomy. Multidetector CT imaging of the abdomen and pelvis was performed using the standard protocol during bolus administration of intravenous contrast. CONTRAST:  100mL OMNIPAQUE IOHEXOL 350 MG/ML SOLN COMPARISON:  None. FINDINGS: CTA CHEST FINDINGS Cardiovascular: Satisfactory opacification of the pulmonary arteries to the segmental level. No evidence of pulmonary embolism. Normal heart size. No pericardial effusion. Thoracic aorta is normal in course and caliber. Mediastinum/Nodes: No axillary, mediastinal, or hilar lymphadenopathy. Diffuse circumferential thickening and mucosal  edema throughout the esophagus. Unremarkable thyroid and trachea. Lungs/Pleura: There are a few scattered areas of subtle ground-glass opacity within the bilateral lung bases (series 2, images 107 and 111). Lungs are otherwise clear. No lobar consolidation. No pleural effusion or pneumothorax. Musculoskeletal: No chest wall abnormality. No acute or significant osseous findings. Review of the MIP images confirms the above findings. CT ABDOMEN and PELVIS FINDINGS Hepatobiliary: No focal liver abnormality is seen. No gallstones, gallbladder wall thickening, or biliary dilatation. Pancreas: Unremarkable. No pancreatic ductal dilatation or surrounding inflammatory changes. Spleen: Normal in size without focal abnormality. 1.6 cm splenule noted at the splenic hilum. Adrenals/Urinary Tract: Adrenal glands are unremarkable. Kidneys are normal, without renal calculi, focal lesion, or hydronephrosis. Bladder is unremarkable. Stomach/Bowel: Stomach is within normal limits. Appendix not definitively identified. No evidence of bowel wall thickening, distention, or inflammatory changes. Vascular/Lymphatic: No significant vascular  findings are present. No enlarged abdominal or pelvic lymph nodes. Reproductive: Prostate is unremarkable. Other: No abdominal wall hernia or abnormality. No abdominopelvic ascites. Musculoskeletal: No acute or significant osseous findings. Transitional lumbosacral anatomy with partial lumbarization of the S1 segment. Review of the MIP images confirms the above findings. IMPRESSION: 1. Negative for pulmonary embolism. 2. There are a few small scattered areas of subtle ground-glass opacity within the bilateral lung bases, which may represent an infectious or inflammatory process. 3. Diffuse circumferential thickening and mucosal edema throughout the esophagus, suggestive of esophagitis. 4. Otherwise negative CT examination of the chest, abdomen, and pelvis. Electronically Signed   By: Duanne Guess  D.O.   On: 11/19/2019 13:14   CT ABDOMEN PELVIS W CONTRAST  Result Date: 11/19/2019 CLINICAL DATA:  Shortness of breath, diffuse abdominal pain, nausea and vomiting EXAM: CT ANGIOGRAPHY CHEST CT ABDOMEN AND PELVIS WITH CONTRAST TECHNIQUE: Multidetector CT imaging of the chest was performed using the standard protocol during bolus administration of intravenous contrast. Multiplanar CT image reconstructions and MIPs were obtained to evaluate the vascular anatomy. Multidetector CT imaging of the abdomen and pelvis was performed using the standard protocol during bolus administration of intravenous contrast. CONTRAST:  OMNIPAQUE IOHEXOL 350 MG/ML SOLN COMPARISON:  None. FINDINGS: CTA CHEST FINDINGS Cardiovascular: Satisfactory opacification of the pulmonary arteries to the segmental level. No evidence of pulmonary embolism. Normal heart size. No pericardial effusion. Thoracic aorta is normal in course and caliber. Mediastinum/Nodes: No axillary, mediastinal, or hilar lymphadenopathy. Diffuse circumferential thickening and mucosal edema throughout the esophagus. Unremarkable thyroid and trachea. Lungs/Pleura: There are a few scattered areas of subtle ground-glass opacity within the bilateral lung bases (series 2, images 107 and 111). Lungs are otherwise clear. No lobar consolidation. No pleural effusion or pneumothorax. Musculoskeletal: No chest wall abnormality. No acute or significant osseous findings. Review of the MIP images confirms the above findings. CT ABDOMEN and PELVIS FINDINGS Hepatobiliary: No focal liver abnormality is seen. No gallstones, gallbladder wall thickening, or biliary dilatation. Pancreas: Unremarkable. No pancreatic ductal dilatation or surrounding inflammatory changes. Spleen: Normal in size without focal abnormality. 1.6 cm splenule noted at the splenic hilum. Adrenals/Urinary Tract: Adrenal glands are unremarkable. Kidneys are normal, without renal calculi, focal lesion, or  hydronephrosis. Bladder is unremarkable. Stomach/Bowel: Stomach is within normal limits. Appendix not definitively identified. No evidence of bowel wall thickening, distention, or inflammatory changes. Vascular/Lymphatic: No significant vascular findings are present. No enlarged abdominal or pelvic lymph nodes. Reproductive: Prostate is unremarkable. Other: No abdominal wall hernia or abnormality. No abdominopelvic ascites. Musculoskeletal: No acute or significant osseous findings. Transitional lumbosacral anatomy with partial lumbarization of the S1 segment. Review of the MIP images confirms the above findings. IMPRESSION: 1. Negative for pulmonary embolism. 2. There are a few small scattered areas of subtle ground-glass opacity within the bilateral lung bases, which may represent an infectious or inflammatory process. 3. Diffuse circumferential thickening and mucosal edema throughout the esophagus, suggestive of esophagitis. 4. Otherwise negative CT examination of the chest, abdomen, and pelvis. Electronically Signed   By: Duanne Guess D.O.   On: 11/19/2019 13:14   DG Chest Port 1 View  Result Date: 11/19/2019 CLINICAL DATA:  Fever EXAM: PORTABLE CHEST 1 VIEW COMPARISON:  06/11/2017 FINDINGS: Heart and mediastinal contours are within normal limits. No focal opacities or effusions. No acute bony abnormality. IMPRESSION: Normal study. Electronically Signed   By: Charlett Nose M.D.   On: 11/19/2019 01:25    Scheduled Meds: . enoxaparin (LOVENOX) injection  40 mg Subcutaneous Q24H  . insulin aspart  0-5 Units Subcutaneous QHS  . insulin aspart  0-9 Units Subcutaneous TID WC  . insulin aspart  3 Units Subcutaneous TID WC  . insulin glargine  20 Units Subcutaneous Q24H  . sodium chloride (PF)        Continuous Infusions: . sodium chloride    . sodium chloride 125 mL/hr at 11/19/19 0844  . ceFEPime (MAXIPIME) IV 2 g (11/19/19 1240)  . dextrose 5 % and 0.45% NaCl    . insulin    . metronidazole  500 mg (11/19/19 1104)  . vancomycin       LOS: 0 days     Briant Cedar, MD [facility]  If 7PM-7AM, please contact night-coverage [email or web address] 11/19/2019, 3:00 PM

## 2019-11-19 NOTE — Progress Notes (Signed)
Inpatient Diabetes Program Recommendations  AACE/ADA: New Consensus Statement on Inpatient Glycemic Control (2015)  Target Ranges:  Prepandial:   less than 140 mg/dL      Peak postprandial:   less than 180 mg/dL (1-2 hours)      Critically ill patients:  140 - 180 mg/dL   Lab Results  Component Value Date   GLUCAP 167 (H) 11/19/2019   HGBA1C 12.3 (H) 06/11/2017    Review of Glycemic Control Results for HANIEL, FIX (MRN [id number]) as of 11/19/2019 08:35  Ref. Range 11/19/2019 00:42 11/19/2019 03:44 11/19/2019 05:04 11/19/2019 06:49  Glucose-Capillary Latest Ref Range: 70 - 99 mg/dL 502 (HH) 774 (H) 128 (H) 180 (H)   Diabetes history: Type 1 DM Outpatient Diabetes medications: Novolin 70/30 45 units BID Current orders for Inpatient glycemic control: Lantus 20 units QD, Novolog 3 units TID, Novolog 0-20 units TID, Novolog 0-5 units QHS  Inpatient Diabetes Program Recommendations:    Noted consult. A1C in process. Will plan to speak with patient pending labs.   Thanks, Lujean Rave, MSN, RNC-OB Diabetes Coordinator [phone] (8a-5p)

## 2019-11-19 NOTE — ED Provider Notes (Signed)
[facility]-EMERGENCY DEPT Provider Note   CSN: [id number] Arrival date & time: 11/19/19  0023   History Chief complaint: Nausea and vomiting  Joseph Barr is a 24 y.o. male.  The history is provided by the patient.  Has history of type 1 diabetes and comes in with 4 days of nausea and vomiting.  He initially had diarrhea, but but that resolved after 2 days.  He has had subjective fever as well as chills and sweats.  He has been trying to drink fluids, but has not been able to hold him down.  He has not checked his blood glucose during this time.  He states he did not take his insulin today.  He does have history of ketoacidosis, but states he had been doing well for the last 3 years.  He has had sick contacts with similar illnesses.  He denies exposure to COVID-19.  Past Medical History:  Diagnosis Date  . Diabetes mellitus     Patient Active Problem List   Diagnosis Date Noted  . Hyperkalemia 06/11/2017  . Tobacco abuse 06/11/2017  . Chest pain 06/11/2017  . Increased anion gap metabolic acidosis 06/11/2017  . DKA (diabetic ketoacidoses) (HCC) 04/24/2015  . DKA, type 1 (HCC) 04/24/2015  . AKI (acute kidney injury) (HCC) 04/24/2015  . Nausea with vomiting 12/06/2014  . Diabetes mellitus type I (HCC) 01/21/2012  . Diabetes mellitus type 1 (HCC) 01/06/2012  . Hyperglycemia 01/06/2012  . Ketonuria 01/06/2012  . Headache(784.0) 01/06/2012    No past surgical history on file.     Family History  Problem Relation Age of Onset  . Cancer Maternal Grandfather   . Hypertension Mother   . Diabetes Neg Hx     Social History   Tobacco Use  . Smoking status: Current Every Day Smoker    Types: E-cigarettes  . Smokeless tobacco: Never Used  Substance Use Topics  . Alcohol use: No  . Drug use: Yes    Types: Marijuana    Home Medications Prior to Admission medications   Medication Sig Start Date End Date Taking? Authorizing Provider  glucose blood  (ACCU-CHEK AVIVA) test strip 1 each by Other route 4 (four) times daily. And lancets 250.03 Patient not taking: Reported on 06/11/2017 06/26/14   Romero Belling, MD  insulin aspart protamine- aspart (NOVOLOG MIX 70/30) (70-30) 100 UNIT/ML injection Inject 0.45 mLs (45 Units total) into the skin 3 (three) times daily. 06/12/17   Rolly Salter, MD    Allergies    Patient has no known allergies.  Review of Systems   Review of Systems  All other systems reviewed and are negative.   Physical Exam Updated Vital Signs BP 123/87 (BP Location: Right Arm)   Pulse (!) 145   Temp (!) 100.4 F (38 C) (Oral)   Resp 17   SpO2 99%   Physical Exam Vitals and nursing note reviewed.   Pale appearing 24 year old male, resting comfortably and in no acute distress. Vital signs are significant for elevated temperature and heart rate. Oxygen saturation is 99%, which is normal.  He does appear mildly tachypneic but no overt clue small respirations. Head is normocephalic and atraumatic. PERRLA, EOMI. Oropharynx is clear, mucous membranes are dry. Neck is nontender and supple without adenopathy or JVD. Back is nontender and there is no CVA tenderness. Lungs are clear without rales, wheezes, or rhonchi. Chest is nontender. Heart is tachycardic without murmur. Abdomen is soft, flat, nontender without masses or  hepatosplenomegaly and peristalsis is hypoactive. Extremities have no cyanosis or edema, full range of motion is present. Skin is warm and dry without rash. Neurologic: Mental status is normal, cranial nerves are intact, there are no motor or sensory deficits.  ED Results / Procedures / Treatments   Labs (all labs ordered are listed, but only abnormal results are displayed) Labs Reviewed  LACTIC ACID, PLASMA - Abnormal; Notable for the following components:      Result Value   Lactic Acid, Venous 2.3 (*)    All other components within normal limits  COMPREHENSIVE METABOLIC PANEL - Abnormal;  Notable for the following components:   Sodium 129 (*)    Chloride 89 (*)    CO2 21 (*)    Glucose, Bld 509 (*)    BUN 24 (*)    Creatinine, Ser 1.39 (*)    Total Bilirubin 1.4 (*)    Anion gap 19 (*)    All other components within normal limits  CBC WITH DIFFERENTIAL/PLATELET - Abnormal; Notable for the following components:   WBC 16.6 (*)    Neutro Abs 13.4 (*)    Monocytes Absolute 2.1 (*)    Abs Immature Granulocytes 0.22 (*)    All other components within normal limits  URINALYSIS, ROUTINE W REFLEX MICROSCOPIC - Abnormal; Notable for the following components:   Color, Urine STRAW (*)    Glucose, UA >=500 (*)    Hgb urine dipstick SMALL (*)    Ketones, ur 80 (*)    All other components within normal limits  BLOOD GAS, VENOUS - Abnormal; Notable for the following components:   pCO2, Ven 37.5 (*)    pO2, Ven 55.8 (*)    Acid-base deficit 2.4 (*)    All other components within normal limits  CBG MONITORING, ED - Abnormal; Notable for the following components:   Glucose-Capillary 526 (*)    All other components within normal limits  CULTURE, BLOOD (ROUTINE X 2)  CULTURE, BLOOD (ROUTINE X 2)  URINE CULTURE  SARS CORONAVIRUS 2 (TAT 6-24 HRS)  APTT  PROTIME-INR  MAGNESIUM  PHOSPHORUS  LIPASE, BLOOD  LACTIC ACID, PLASMA  BETA-HYDROXYBUTYRIC ACID  BETA-HYDROXYBUTYRIC ACID  BETA-HYDROXYBUTYRIC ACID    EKG Sinus tachycardia 142 bpm.  Rightward axis.  Nonspecific ST and T changes, probably rate related.  Borderline right axis deviation.  When compared with ECG of June 11, 2017, heart rate has increased.  Radiology DG Chest Port 1 View  Result Date: 11/19/2019 CLINICAL DATA:  Fever EXAM: PORTABLE CHEST 1 VIEW COMPARISON:  06/11/2017 FINDINGS: Heart and mediastinal contours are within normal limits. No focal opacities or effusions. No acute bony abnormality. IMPRESSION: Normal study. Electronically Signed   By: Rolm Baptise M.D.   On: 11/19/2019 01:25     Procedures Procedures  CRITICAL CARE Performed by: Delora Fuel Total critical care time: 60 minutes Critical care time was exclusive of separately billable procedures and treating other patients. Critical care was necessary to treat or prevent imminent or life-threatening deterioration. Critical care was time spent personally by me on the following activities: development of treatment plan with patient and/or surrogate as well as nursing, discussions with consultants, evaluation of patient's response to treatment, examination of patient, obtaining history from patient or surrogate, ordering and performing treatments and interventions, ordering and review of laboratory studies, ordering and review of radiographic studies, pulse oximetry and re-evaluation of patient's condition.  Medications Ordered in ED Medications  ceFEPIme (MAXIPIME) 2 g in sodium chloride 0.9 %  100 mL IVPB (has no administration in time range)  metroNIDAZOLE (FLAGYL) IVPB 500 mg (has no administration in time range)  insulin aspart (novoLOG) injection 10 Units (has no administration in time range)  insulin regular, human (MYXREDLIN) 100 units/ 100 mL infusion (has no administration in time range)  0.9 %  sodium chloride infusion (has no administration in time range)  dextrose 5 %-0.45 % sodium chloride infusion (has no administration in time range)  dextrose 50 % solution 0-50 mL (has no administration in time range)  vancomycin (VANCOREADY) IVPB 1500 mg/300 mL (has no administration in time range)  sodium chloride 0.9 % bolus 1,000 mL (has no administration in time range)  ondansetron (ZOFRAN) injection 4 mg (has no administration in time range)  lactated ringers bolus 1,000 mL (1,000 mLs Intravenous New Bag/Given 11/19/19 0128)    And  lactated ringers bolus 1,000 mL (1,000 mLs Intravenous New Bag/Given 11/19/19 0141)  ondansetron (ZOFRAN) injection 4 mg (4 mg Intravenous Given 11/19/19 0129)    ED Course  I have  reviewed the triage vital signs and the nursing notes.  Pertinent labs & imaging results that were available during my care of the patient were reviewed by me and considered in my medical decision making (see chart for details).  MDM Rules/Calculators/A&P Nausea and vomiting in a patient who is diabetic.  Overall picture is concerning for ketoacidosis.  Because of fever, sepsis work-up is initiated, but will hold on giving antibiotics until it is clear if fever is from an infectious cause.  He is given IV fluids and ondansetron.  Old records are reviewed confirming prior hospitalizations for ketoacidosis, but none for the last 3 years.  Chest x-ray shows no evidence of pneumonia.  Venous blood gas is normal, but anion gap is noted to be elevated to 19 and CO2 is slightly low at 21.  This is suggestive of early ketoacidosis.  Lactic acid is minimally elevated at 2.3, and he has already received early, goal-directed fluid therapy.  There is evidence of an acute kidney injury with creatinine 1.39.  Mild hyponatremia is present consistent with degree of hyperglycemia.  Urinalysis does have ketones, unclear if this is related to early ketoacidosis or starvation ketosis from repetitive vomiting.  He was started on insulin drip.  Case is discussed with Dr. Antionette Char of [facility], who agrees to admit the patient.  Final Clinical Impression(s) / ED Diagnoses Final diagnoses:  Hyperglycemia  Sepsis due to undetermined organism (HCC)  Non-intractable vomiting with nausea, unspecified vomiting type  Metabolic acidosis, increased anion gap  Acute kidney injury (nontraumatic) [facility])    Rx / DC Orders ED Discharge Orders    None       Dione Booze, MD 11/19/19 [phone]

## 2019-11-19 NOTE — ED Notes (Signed)
Pt lying in bed awake vomiting. meds given. Full monitor on. Will continue to monitor pt.

## 2019-11-19 NOTE — H&P (Signed)
History and Physical    Joseph Barr MHD:[id number] DOB: [birth year] DOA: 11/19/2019  PCP: Alfonse Spruce, FNP   Patient coming from: Home   Chief Complaint: Abdominal discomfort, N/V/D, elevated blood sugars   HPI: Joseph Barr is a 24 y.o. male with medical history significant for type 1 diabetes mellitus, diagnosed at age 11, now presenting to the emergency department with 4 days of abdominal discomfort, nausea, vomiting, loose stools that have since resolved, place, and uncontrolled blood sugars.  Patient denies any cough or shortness of breath, denies dysuria or flank pain, denies any neck stiffness or headache, and has not noted any rash or wounds.  ED Course: Upon arrival to the ED, patient is found to be febrile to 38 C, saturating mid 90s on room air, tachycardic to the 140s, and with stable blood pressure.  EKG features sinus tachycardia with rate 142 and PVC.  Chest x-ray is a normal study.  Chemistry panel serum glucose 509, bicarbonate 21, anion gap 19, and creatinine 1.39, up from 0.98 in 2018.  CBC with leukocytosis to 16,600.  Lactic acid is 2.3.  Beta hydroxybutyrate is elevated to 5.17.  Urinalysis notable for ketones.  Blood and urine cultures were collected, 2 L lactated Ringer's administered, and the patient was started on insulin infusion, cefepime, vancomycin, and Flagyl.  COVID-19 PCR test is pending.  Review of Systems:  All other systems reviewed and apart from HPI, are negative.  Past Medical History:  Diagnosis Date  . Diabetes mellitus     History reviewed. No pertinent surgical history.   reports that he has been smoking e-cigarettes. He has never used smokeless tobacco. He reports current drug use. Drug: Marijuana. He reports that he does not drink alcohol.  No Known Allergies  Family History  Problem Relation Age of Onset  . Cancer Maternal Grandfather   . Hypertension Mother   . Diabetes Neg Hx      Prior to Admission medications     Medication Sig Start Date End Date Taking? Authorizing Provider  insulin aspart protamine- aspart (NOVOLOG MIX 70/30) (70-30) 100 UNIT/ML injection Inject 0.45 mLs (45 Units total) into the skin 3 (three) times daily. Patient taking differently: Inject 45 Units into the skin 2 (two) times daily with a meal.  06/12/17  Yes Lavina Hamman, MD  glucose blood (ACCU-CHEK AVIVA) test strip 1 each by Other route 4 (four) times daily. And lancets 250.03 Patient not taking: Reported on 06/11/2017 06/26/14   Renato Shin, MD    Physical Exam: Vitals:   11/19/19 0230 11/19/19 0300 11/19/19 0330 11/19/19 0400  BP: (!) 137/92 126/86 125/83 127/81  Pulse: (!) 124 (!) 132 (!) 132 (!) 124  Resp: 15 (!) 23 13 13   Temp:      TempSrc:      SpO2: 97% 96% 92% 96%  Weight:      Height:         Constitutional: NAD, calm  Eyes: PERTLA, lids and conjunctivae normal ENMT: Mucous membranes are moist. Posterior pharynx clear of any exudate or lesions.   Neck: normal, supple, no masses, no thyromegaly Respiratory: clear to auscultation bilaterally, no wheezing, no crackles. No accessory muscle use.  Cardiovascular: Rate ~120 and regular. No extremity edema. Abdomen: No distension, no tenderness, soft. Bowel sounds active.  Musculoskeletal: no clubbing / cyanosis. No joint deformity upper and lower extremities.  No meningismus.  Skin: no significant rashes, lesions, ulcers. Warm, dry, well-perfused. Neurologic: CN 2-12 grossly intact. Sensation  intact. Moving all extremities.  Psychiatric: Alert and oriented x 3. Pleasant and cooperative.    Labs and Imaging on Admission: I have personally reviewed following labs and imaging studies  CBC: Recent Labs  Lab 11/19/19 0100  WBC 16.6*  NEUTROABS 13.4*  HGB 15.4  HCT 43.8  MCV 81.6  PLT 286   Basic Metabolic Panel: Recent Labs  Lab 11/19/19 0100  NA 129*  K 4.4  CL 89*  CO2 21*  GLUCOSE 509*  BUN 24*  CREATININE 1.39*  CALCIUM 9.2  MG 1.9   PHOS 2.6   GFR: Estimated Creatinine Clearance: 76.3 mL/min (A) (by C-G formula based on SCr of 1.39 mg/dL (H)). Liver Function Tests: Recent Labs  Lab 11/19/19 0100  AST 17  ALT 18  ALKPHOS 78  BILITOT 1.4*  PROT 8.1  ALBUMIN 4.1   Recent Labs  Lab 11/19/19 0100  LIPASE 12   No results for input(s): AMMONIA in the last 168 hours. Coagulation Profile: Recent Labs  Lab 11/19/19 0100  INR 1.1   Cardiac Enzymes: No results for input(s): CKTOTAL, CKMB, CKMBINDEX, TROPONINI in the last 168 hours. BNP (last 3 results) No results for input(s): PROBNP in the last 8760 hours. HbA1C: No results for input(s): HGBA1C in the last 72 hours. CBG: Recent Labs  Lab 11/19/19 0042 11/19/19 0344  GLUCAP 526* 384*   Lipid Profile: No results for input(s): CHOL, HDL, LDLCALC, TRIG, CHOLHDL, LDLDIRECT in the last 72 hours. Thyroid Function Tests: No results for input(s): TSH, T4TOTAL, FREET4, T3FREE, THYROIDAB in the last 72 hours. Anemia Panel: No results for input(s): VITAMINB12, FOLATE, FERRITIN, TIBC, IRON, RETICCTPCT in the last 72 hours. Urine analysis:    Component Value Date/Time   COLORURINE STRAW (A) 11/19/2019 0100   APPEARANCEUR CLEAR 11/19/2019 0100   LABSPEC 1.024 11/19/2019 0100   PHURINE 6.0 11/19/2019 0100   GLUCOSEU >=500 (A) 11/19/2019 0100   HGBUR SMALL (A) 11/19/2019 0100   BILIRUBINUR NEGATIVE 11/19/2019 0100   BILIRUBINUR Negative 08/01/2016 0950   KETONESUR 80 (A) 11/19/2019 0100   PROTEINUR NEGATIVE 11/19/2019 0100   UROBILINOGEN 0.2 08/01/2016 0950   UROBILINOGEN 0.2 04/24/2015 1305   NITRITE NEGATIVE 11/19/2019 0100   LEUKOCYTESUR NEGATIVE 11/19/2019 0100   Sepsis Labs: @LABRCNTIP (procalcitonin:4,lacticidven:4) ) Recent Results (from the past 240 hour(s))  Blood Culture (routine x 2)     Status: None (Preliminary result)   Collection Time: 11/19/19  1:40 AM   Specimen: BLOOD  Result Value Ref Range Status   Specimen Description   Final     BLOOD BLOOD LEFT FOREARM Performed at [facility], [address], [place], [place]    Special Requests   Final    BOTTLES DRAWN AEROBIC AND ANAEROBIC Blood Culture results may not be optimal due to an inadequate volume of blood received in culture bottles Performed at [facility], [address] [place], [place]    Culture PENDING  Incomplete   Report Status PENDING  Incomplete     Radiological Exams on Admission: DG Chest Port 1 View  Result Date: 11/19/2019 CLINICAL DATA:  Fever EXAM: PORTABLE CHEST 1 VIEW COMPARISON:  06/11/2017 FINDINGS: Heart and mediastinal contours are within normal limits. No focal opacities or effusions. No acute bony abnormality. IMPRESSION: Normal study. Electronically Signed   By: 08/11/2017 M.D.   On: 11/19/2019 01:25    EKG: Independently reviewed. Sinus tachycardia, rate 142, PVC.   Assessment/Plan   1. DKA; type  I DM  - Presents with 4 days of malaise, GI upset, and uncontrolled blood sugars, and is found to have serum glucose of 509 with mild elevation in AG, slightly low bicarbonate, urine ketones, and BHOB of 5.17   - He was fluid-resuscitated in ED and started on insulin infusion  - Continue IVF, continue IV insulin infusion with frequent CBGs and serial chem panels    2. SIRS  - Presents with 4 days of malaise, N/V, loose stools that have resolved, and uncontrolled blood sugar, and is found to have fever, tachycardia, and leukocytosis  - No respiratory sxs and CXR clear, denies urinary sxs, abd exam benign, no meningismus, and no wounds or cellulitis  - He was cultured in ED and started on broad-spectrum antibiotics empirically  - Continue antibiotics for now, check procalcitonin, trend lactate, follow cultures and clinical course    3. Renal insufficiency  - SCr is 1.39, up from 0.98 in 2018  - Likely acute prerenal azotemia in setting of recent N/V/D  - Continue fluid-resuscitation,  renally-dose medications, repeat chem panel     DVT prophylaxis: Lovenox  Code Status: Full  Family Communication: Discussed with patient   Disposition Plan: Likely home in 2-3 days once DKA resolved and sepsis resolved or ruled-out  Consults called: None  Admission status: Inpatient     Briscoe Deutscher, MD [facility] Pager: See [email or web address]  If 7AM-7PM, please contact the daytime attending [email or web address]  11/19/2019, 5:04 AM

## 2019-11-19 NOTE — ED Notes (Signed)
Pt connected to all v/s monitoring. Urinal at bedside and pt aware urine sample is needed. Pt requesting PO fluids, to which I advised a dr must see him first. Pt vomiting/dry heaving as I finished and exited room. MD aware.

## 2019-11-19 NOTE — ED Notes (Signed)
Pt lying in bed. Full monitor on. Denies any needs. Will continue to monitor.  

## 2019-11-19 NOTE — ED Triage Notes (Signed)
Pt brought in by EMS for 4 days of weakness, vomiting and "HI" blood sugars. Pt states he passed out today twice. Hx of T1DM.

## 2019-11-19 NOTE — Progress Notes (Signed)
Pharmacy Antibiotic Note  Joseph Barr is a 24 y.o. male admitted on 11/19/2019 with sepsis.  Pharmacy has been consulted for cefepime and vancomycin dosing.  Plan: Cefepime 2 gm IV q8h Vancomycin 1500 mg x1 then 750 mg IV q12h for est AUC = 467 Goal AUC = 400-550 F/u scr/cultures/levels  Height: 6' (182.9 cm) Weight: 145 lb (65.8 kg) IBW/kg (Calculated) : 77.6  Temp (24hrs), Avg:100.4 F (38 C), Min:100.4 F (38 C), Max:100.4 F (38 C)  Recent Labs  Lab 11/19/19 0100  WBC 16.6*  CREATININE 1.39*  LATICACIDVEN 2.3*    Estimated Creatinine Clearance: 76.3 mL/min (A) (by C-G formula based on SCr of 1.39 mg/dL (H)).    No Known Allergies  Antimicrobials this admission: 3/19 cefepime >>  3/19 vancomycin  >>   Dose adjustments this admission:   Microbiology results:  BCx:   UCx:    Sputum:    MRSA PCR:   Thank you for allowing pharmacy to be a part of this patient's care.  Lorenza Evangelist 11/19/2019 5:17 AM

## 2019-11-20 ENCOUNTER — Other Ambulatory Visit: Payer: Self-pay

## 2019-11-20 LAB — BASIC METABOLIC PANEL
Anion gap: 11 (ref 5–15)
Anion gap: 13 (ref 5–15)
Anion gap: 14 (ref 5–15)
Anion gap: 16 — ABNORMAL HIGH (ref 5–15)
Anion gap: 17 — ABNORMAL HIGH (ref 5–15)
Anion gap: 22 — ABNORMAL HIGH (ref 5–15)
BUN: 10 mg/dL (ref 6–20)
BUN: 10 mg/dL (ref 6–20)
BUN: 12 mg/dL (ref 6–20)
BUN: 13 mg/dL (ref 6–20)
BUN: 14 mg/dL (ref 6–20)
BUN: 9 mg/dL (ref 6–20)
CO2: 12 mmol/L — ABNORMAL LOW (ref 22–32)
CO2: 12 mmol/L — ABNORMAL LOW (ref 22–32)
CO2: 13 mmol/L — ABNORMAL LOW (ref 22–32)
CO2: 15 mmol/L — ABNORMAL LOW (ref 22–32)
CO2: 18 mmol/L — ABNORMAL LOW (ref 22–32)
CO2: 20 mmol/L — ABNORMAL LOW (ref 22–32)
Calcium: 8 mg/dL — ABNORMAL LOW (ref 8.9–10.3)
Calcium: 8.3 mg/dL — ABNORMAL LOW (ref 8.9–10.3)
Calcium: 8.3 mg/dL — ABNORMAL LOW (ref 8.9–10.3)
Calcium: 8.5 mg/dL — ABNORMAL LOW (ref 8.9–10.3)
Calcium: 8.6 mg/dL — ABNORMAL LOW (ref 8.9–10.3)
Calcium: 9.1 mg/dL (ref 8.9–10.3)
Chloride: 103 mmol/L (ref 98–111)
Chloride: 104 mmol/L (ref 98–111)
Chloride: 106 mmol/L (ref 98–111)
Chloride: 107 mmol/L (ref 98–111)
Chloride: 107 mmol/L (ref 98–111)
Chloride: 109 mmol/L (ref 98–111)
Creatinine, Ser: 0.71 mg/dL (ref 0.61–1.24)
Creatinine, Ser: 0.73 mg/dL (ref 0.61–1.24)
Creatinine, Ser: 0.81 mg/dL (ref 0.61–1.24)
Creatinine, Ser: 0.88 mg/dL (ref 0.61–1.24)
Creatinine, Ser: 0.95 mg/dL (ref 0.61–1.24)
Creatinine, Ser: 1.06 mg/dL (ref 0.61–1.24)
GFR calc Af Amer: >60 mL/min (ref >60)
GFR calc Af Amer: >60 mL/min (ref >60)
GFR calc Af Amer: >60 mL/min (ref >60)
GFR calc Af Amer: >60 mL/min (ref >60)
GFR calc Af Amer: >60 mL/min (ref >60)
GFR calc Af Amer: >60 mL/min (ref >60)
GFR calc non Af Amer: >60 mL/min (ref >60)
GFR calc non Af Amer: >60 mL/min (ref >60)
GFR calc non Af Amer: >60 mL/min (ref >60)
GFR calc non Af Amer: >60 mL/min (ref >60)
GFR calc non Af Amer: >60 mL/min (ref >60)
GFR calc non Af Amer: >60 mL/min (ref >60)
Glucose, Bld: 163 mg/dL — ABNORMAL HIGH (ref 70–99)
Glucose, Bld: 166 mg/dL — ABNORMAL HIGH (ref 70–99)
Glucose, Bld: 177 mg/dL — ABNORMAL HIGH (ref 70–99)
Glucose, Bld: 220 mg/dL — ABNORMAL HIGH (ref 70–99)
Glucose, Bld: 221 mg/dL — ABNORMAL HIGH (ref 70–99)
Glucose, Bld: 335 mg/dL — ABNORMAL HIGH (ref 70–99)
Potassium: 3.4 mmol/L — ABNORMAL LOW (ref 3.5–5.1)
Potassium: 3.8 mmol/L (ref 3.5–5.1)
Potassium: 4 mmol/L (ref 3.5–5.1)
Potassium: 4 mmol/L (ref 3.5–5.1)
Potassium: 4.2 mmol/L (ref 3.5–5.1)
Potassium: 4.3 mmol/L (ref 3.5–5.1)
Sodium: 135 mmol/L (ref 135–145)
Sodium: 136 mmol/L (ref 135–145)
Sodium: 136 mmol/L (ref 135–145)
Sodium: 137 mmol/L (ref 135–145)
Sodium: 137 mmol/L (ref 135–145)
Sodium: 138 mmol/L (ref 135–145)

## 2019-11-20 LAB — CBC WITH DIFFERENTIAL/PLATELET
Abs Immature Granulocytes: 0.03 10*3/uL (ref 0.00–0.07)
Basophils Absolute: 0 10*3/uL (ref 0.0–0.1)
Basophils Relative: 0 %
Eosinophils Absolute: 0 10*3/uL (ref 0.0–0.5)
Eosinophils Relative: 0 %
HCT: 34.1 % — ABNORMAL LOW (ref 39.0–52.0)
Hemoglobin: 11.7 g/dL — ABNORMAL LOW (ref 13.0–17.0)
Immature Granulocytes: 0 %
Lymphocytes Relative: 9 %
Lymphs Abs: 1 10*3/uL (ref 0.7–4.0)
MCH: 29.3 pg (ref 26.0–34.0)
MCHC: 34.3 g/dL (ref 30.0–36.0)
MCV: 85.5 fL (ref 80.0–100.0)
Monocytes Absolute: 0.9 10*3/uL (ref 0.1–1.0)
Monocytes Relative: 8 %
Neutro Abs: 9.1 10*3/uL — ABNORMAL HIGH (ref 1.7–7.7)
Neutrophils Relative %: 83 %
Platelets: 233 10*3/uL (ref 150–400)
RBC: 3.99 MIL/uL — ABNORMAL LOW (ref 4.22–5.81)
RDW: 12.7 % (ref 11.5–15.5)
WBC: 11.2 10*3/uL — ABNORMAL HIGH (ref 4.0–10.5)
nRBC: 0 % (ref 0.0–0.2)

## 2019-11-20 LAB — GLUCOSE, CAPILLARY
Glucose-Capillary: 333 mg/dL — ABNORMAL HIGH (ref 70–99)
Glucose-Capillary: 276 mg/dL — ABNORMAL HIGH (ref 70–99)
Glucose-Capillary: 184 mg/dL — ABNORMAL HIGH (ref 70–99)
Glucose-Capillary: 161 mg/dL — ABNORMAL HIGH (ref 70–99)
Glucose-Capillary: 135 mg/dL — ABNORMAL HIGH (ref 70–99)
Glucose-Capillary: 132 mg/dL — ABNORMAL HIGH (ref 70–99)
Glucose-Capillary: 168 mg/dL — ABNORMAL HIGH (ref 70–99)
Glucose-Capillary: 162 mg/dL — ABNORMAL HIGH (ref 70–99)
Glucose-Capillary: 128 mg/dL — ABNORMAL HIGH (ref 70–99)
Glucose-Capillary: 159 mg/dL — ABNORMAL HIGH (ref 70–99)

## 2019-11-20 LAB — BETA-HYDROXYBUTYRIC ACID
Beta-Hydroxybutyric Acid: 3.94 mmol/L — ABNORMAL HIGH (ref 0.05–0.27)
Beta-Hydroxybutyric Acid: 5.15 mmol/L — ABNORMAL HIGH (ref 0.05–0.27)
Beta-Hydroxybutyric Acid: 4.59 mmol/L — ABNORMAL HIGH (ref 0.05–0.27)

## 2019-11-20 LAB — HIV ANTIBODY (ROUTINE TESTING W REFLEX): HIV Screen 4th Generation wRfx: NONREACTIVE

## 2019-11-20 LAB — URINE CULTURE: Culture: NO GROWTH

## 2019-11-20 LAB — PROCALCITONIN: Procalcitonin: 1.6 ng/mL

## 2019-11-20 MED ORDER — SODIUM CHLORIDE 0.9 % IV SOLN: INTRAVENOUS | Status: DC

## 2019-11-20 MED ORDER — INSULIN ASPART 100 UNIT/ML ~~LOC~~ SOLN
0.0000 [IU] | SUBCUTANEOUS | Status: DC
  Administered 2019-11-20: 2 [IU] via SUBCUTANEOUS
  Administered 2019-11-20 – 2019-11-21 (×3): 1 [IU] via SUBCUTANEOUS

## 2019-11-20 MED ORDER — DEXTROSE 5 % IV SOLN: INTRAVENOUS | Status: DC

## 2019-11-20 MED ORDER — INSULIN REGULAR(HUMAN) IN NACL 100-0.9 UT/100ML-% IV SOLN
INTRAVENOUS | Status: DC
  Administered 2019-11-20: 3 [IU]/h via INTRAVENOUS
  Filled 2019-11-20 (×2): qty 100

## 2019-11-20 MED ORDER — DEXTROSE 50 % IV SOLN: 0.0000 mL | INTRAVENOUS | Status: DC | PRN

## 2019-11-20 MED ORDER — VANCOMYCIN HCL IN DEXTROSE 1-5 GM/200ML-% IV SOLN
1000.0000 mg | Freq: Two times a day (BID) | INTRAVENOUS | Status: DC
  Administered 2019-11-20 – 2019-11-22 (×4): 1000 mg via INTRAVENOUS
  Filled 2019-11-20 (×4): qty 200

## 2019-11-20 MED ORDER — DEXTROSE-NACL 5-0.45 % IV SOLN: INTRAVENOUS | Status: DC

## 2019-11-20 MED ORDER — POTASSIUM CHLORIDE 10 MEQ/100ML IV SOLN
10.0000 meq | INTRAVENOUS | Status: AC
  Administered 2019-11-20 (×2): 10 meq via INTRAVENOUS
  Filled 2019-11-20 (×2): qty 100

## 2019-11-20 MED ORDER — SODIUM CHLORIDE 0.9 % IV BOLUS
1000.0000 mL | INTRAVENOUS | Status: AC
  Administered 2019-11-20 (×2): 1000 mL via INTRAVENOUS

## 2019-11-20 MED ORDER — CHLORHEXIDINE GLUCONATE CLOTH 2 % EX PADS
6.0000 | MEDICATED_PAD | Freq: Every day | CUTANEOUS | Status: DC
  Administered 2019-11-20 – 2019-11-22 (×3): 6 via TOPICAL

## 2019-11-20 NOTE — Progress Notes (Signed)
PROGRESS NOTE  Joseph Barr HKV:[id number] DOB: November 06, 1995 DOA: 11/19/2019 PCP: Alfonse Spruce, FNP  HPI/Recap of past 24 hours: HPI from Dr Carlyle Basques is a 24 y.o. male with medical history significant for type 1 diabetes mellitus, diagnosed at age 2, now presenting to the emergency department with 4 days of abdominal discomfort, nausea, vomiting, loose stools. Upon arrival to the ED, patient is found to be febrile to 38 C, saturating mid 90s on room air, tachycardic to the 140s, and with stable blood pressure.  EKG features sinus tachycardia with rate 142 and PVC.  Chest x-ray is a normal study.  Chemistry panel serum glucose 509, bicarbonate 21, anion gap 19, and creatinine 1.39, up from 0.98 in 2018.  CBC with leukocytosis to 16,600.  Lactic acid is 2.3.  Beta hydroxybutyrate is elevated to 5.17. Urinalysis notable for ketones.  Blood and urine cultures were collected, patient was started on insulin infusion, cefepime, vancomycin, and Flagyl.  COVID-19 PCR test is negative.  Patient admitted for further management.     Overnight, pt with N/V, some abdominal discomfort. Noted to be more lethargic this am, BMP showed pt now in DKA. Protocol initiated, unable to start insulin drip as no beds available in SDU.     Assessment/Plan: Principal Problem:   DKA (diabetic ketoacidoses) (HCC) Active Problems:   SIRS (systemic inflammatory response syndrome) (HCC)   Renal insufficiency   DKA/type 1 diabetes mellitus A1c 11.6 On presentation, serum glucose 509, elevated anion gap, low bicarb, urine ketones positive Status post insulin drip, switch to subcu insulin On 3/21, pt more lethargic, repeat BMP showed elevated anion gap, low bicarb Hyperglycemic crisis protocol initiated, switch to insulin drip, npo transfer to SDU, for close monitoring Diabetes coordinator on board  Sepsis Unknown etiology, ??gastroenteritis  Febrile, leukocytosis, tachycardic, lactic acidosis on  admission Currently afebrile, with leukocytosis UA unremarkable for infection, UC no growth BC x2 NGTD Procalcitonin elevated at 2.81-->1.60 CT A/P/chest negative for PE, noted groundglass opacity within the bilateral lung bases, which may represent infectious or inflammatory process, esophagitis Continue IV fluids Continue IV cefepime, Flagyl, vancomycin (MRSA PCR pending)  Esophagitis IV Protonix twice daily  AKI Resolved  Marijuana abuse Advised to quit ??Cyclical vomiting       Malnutrition Type:      Malnutrition Characteristics:      Nutrition Interventions:       Estimated body mass index is 19.67 kg/m as calculated from the following:   Height as of this encounter: 6' (1.829 m).   Weight as of this encounter: 65.8 kg.     Code Status: Full  Family Communication: Discussed with patient extensively  Disposition Plan: Patient still requiring IV antibiotics, IV fluids, on insulin drip plan for discharge in 24 to 48 hours once patient able to tolerate orally adequately.   Consultants:  None  Procedures:  None  Antimicrobials:  Cefepime  Flagyl  Vancomycin  DVT prophylaxis: Lovenox   Objective: Vitals:   11/19/19 1907 11/19/19 2224 11/20/19 0408 11/20/19 1134  BP: 130/89 118/82 129/79 120/77  Pulse: (!) 110 (!) 107 (!) 111 (!) 108  Resp: 18 20 20 16   Temp: 98.3 F (36.8 C) 99.2 F (37.3 C) 99 F (37.2 C) 99.1 F (37.3 C)  TempSrc: Oral Oral Oral Oral  SpO2: 100% 100% 99% 99%  Weight:      Height:        Intake/Output Summary (Last 24 hours) at 11/20/2019 1653 Last data filed  at 11/20/2019 1606 Gross per 24 hour  Intake 2021.22 ml  Output 1425 ml  Net 596.22 ml   Filed Weights   11/19/19 0117  Weight: 65.8 kg    Exam:  General: NAD, acutely ill-appearing, lethargic  Cardiovascular: S1, S2 present  Respiratory: CTAB  Abdomen: Soft, nontender, nondistended, bowel sounds present  Musculoskeletal: No bilateral  pedal edema noted  Skin: Normal  Psychiatry: Normal mood   Data Reviewed: CBC: Recent Labs  Lab 11/19/19 0100 11/20/19 0023  WBC 16.6* 11.2*  NEUTROABS 13.4* 9.1*  HGB 15.4 11.7*  HCT 43.8 34.1*  MCV 81.6 85.5  PLT 286 233   Basic Metabolic Panel: Recent Labs  Lab 11/19/19 0100 11/19/19 0520 11/19/19 1939 11/20/19 0023 11/20/19 0829 11/20/19 1112 11/20/19 1436  NA 129*   < > 139 136 138 137 137  K 4.4   < > 3.5 3.8 4.3 4.0 4.2  CL 89*   < > 101 103 104 107 109  CO2 21*   < > 24 20* 12* 13* 12*  GLUCOSE 509*   < > 135* 221* 335* 220* 166*  BUN 24*   < > 10 10 13 14 12   CREATININE 1.39*   < > 0.77 0.88 1.06 0.95 0.81  CALCIUM 9.2   < > 8.9 8.6* 9.1 8.5* 8.0*  MG 1.9  --   --   --   --   --   --   PHOS 2.6  --   --   --   --   --   --    < > = values in this interval not displayed.   GFR: Estimated Creatinine Clearance: 130.9 mL/min (by C-G formula based on SCr of 0.81 mg/dL). Liver Function Tests: Recent Labs  Lab 11/19/19 0100  AST 17  ALT 18  ALKPHOS 78  BILITOT 1.4*  PROT 8.1  ALBUMIN 4.1   Recent Labs  Lab 11/19/19 0100  LIPASE 12   No results for input(s): AMMONIA in the last 168 hours. Coagulation Profile: Recent Labs  Lab 11/19/19 0100  INR 1.1   Cardiac Enzymes: No results for input(s): CKTOTAL, CKMB, CKMBINDEX, TROPONINI in the last 168 hours. BNP (last 3 results) No results for input(s): PROBNP in the last 8760 hours. HbA1C: Recent Labs    11/19/19 0822  HGBA1C 11.6*   CBG: Recent Labs  Lab 11/20/19 0729 11/20/19 0935 11/20/19 1142 11/20/19 1320 11/20/19 1550  GLUCAP 333* 276* 184* 161* 135*   Lipid Profile: No results for input(s): CHOL, HDL, LDLCALC, TRIG, CHOLHDL, LDLDIRECT in the last 72 hours. Thyroid Function Tests: No results for input(s): TSH, T4TOTAL, FREET4, T3FREE, THYROIDAB in the last 72 hours. Anemia Panel: No results for input(s): VITAMINB12, FOLATE, FERRITIN, TIBC, IRON, RETICCTPCT in the last 72  hours. Urine analysis:    Component Value Date/Time   COLORURINE STRAW (A) 11/19/2019 0100   APPEARANCEUR CLEAR 11/19/2019 0100   LABSPEC 1.024 11/19/2019 0100   PHURINE 6.0 11/19/2019 0100   GLUCOSEU >=500 (A) 11/19/2019 0100   HGBUR SMALL (A) 11/19/2019 0100   BILIRUBINUR NEGATIVE 11/19/2019 0100   BILIRUBINUR Negative 08/01/2016 0950   KETONESUR 80 (A) 11/19/2019 0100   PROTEINUR NEGATIVE 11/19/2019 0100   UROBILINOGEN 0.2 08/01/2016 0950   UROBILINOGEN 0.2 04/24/2015 1305   NITRITE NEGATIVE 11/19/2019 0100   LEUKOCYTESUR NEGATIVE 11/19/2019 0100   Sepsis Labs: @LABRCNTIP (procalcitonin:4,lacticidven:4)  ) Recent Results (from the past 240 hour(s))  Urine culture     Status: None  Collection Time: 11/19/19  1:00 AM   Specimen: In/Out Cath Urine  Result Value Ref Range Status   Specimen Description IN/OUT CATH URINE  Final   Special Requests   Final    NONE Performed at [facility], [address], [place], [place] [address]    Culture NO GROWTH  Final   Report Status 11/20/2019 FINAL  Final  SARS CORONAVIRUS 2 (TAT 6-24 HRS) Nasopharyngeal Nasopharyngeal Swab     Status: None   Collection Time: 11/19/19  1:07 AM   Specimen: Nasopharyngeal Swab  Result Value Ref Range Status   SARS Coronavirus 2 NEGATIVE NEGATIVE Final    Comment: (NOTE) SARS-CoV-2 target nucleic acids are NOT DETECTED. The SARS-CoV-2 RNA is generally detectable in upper and lower respiratory specimens during the acute phase of infection. Negative results do not preclude SARS-CoV-2 infection, do not rule out co-infections with other pathogens, and should not be used as the sole basis for treatment or other patient management decisions. Negative results must be combined with clinical observations, patient history, and epidemiological information. The expected result is Negative. Fact Sheet for Patients: HairSlick.no Fact Sheet for Healthcare  Providers: quierodirigir.com This test is not yet approved or cleared by the [place] FDA and  has been authorized for detection and/or diagnosis of SARS-CoV-2 by FDA under an Emergency Use Authorization (EUA). This EUA will remain  in effect (meaning this test can be used) for the duration of the COVID-19 declaration under Section 56 4(b)(1) of the Act, 21 U.S.C. section 360bbb-3(b)(1), unless the authorization is terminated or revoked sooner. Performed at [facility], [address], [place], [place] [address]   Blood Culture (routine x 2)     Status: None (Preliminary result)   Collection Time: 11/19/19  1:25 AM   Specimen: BLOOD  Result Value Ref Range Status   Specimen Description   Final    BLOOD RIGHT ANTECUBITAL Performed at [facility], [address], [place], [place] [address]    Special Requests   Final    BOTTLES DRAWN AEROBIC AND ANAEROBIC Blood Culture adequate volume Performed at [facility], [address], [place], [place] [address]    Culture   Final    NO GROWTH 1 DAY Performed at [facility], [address], [place], [place] [address]    Report Status PENDING  Incomplete  Blood Culture (routine x 2)     Status: None (Preliminary result)   Collection Time: 11/19/19  1:40 AM   Specimen: BLOOD  Result Value Ref Range Status   Specimen Description   Final    BLOOD BLOOD LEFT FOREARM Performed at [facility], [address], [place], [place] [address]    Special Requests   Final    BOTTLES DRAWN AEROBIC AND ANAEROBIC Blood Culture results may not be optimal due to an inadequate volume of blood received in culture bottles Performed at [facility], [address] [place], [place] [address]    Culture   Final    NO GROWTH 1 DAY Performed at [facility], [address] [place], [place] [address]    Report Status PENDING  Incomplete      Studies: No  results found.  Scheduled Meds: . enoxaparin (LOVENOX) injection  40 mg Subcutaneous Q24H  . insulin aspart  0-9 Units Subcutaneous Q4H  . pantoprazole (PROTONIX) IV  40 mg Intravenous Q12H    Continuous Infusions: .  sodium chloride Stopped (11/20/19 1414)  . ceFEPime (MAXIPIME) IV 2 g (11/20/19 1322)  . dextrose 125 mL/hr at 11/20/19 1641  . dextrose 5 % and 0.45% NaCl    . insulin    . metronidazole 500 mg (11/20/19 1151)  . vancomycin       LOS: 1 day     Briant Cedar, MD [facility]  If 7PM-7AM, please contact night-coverage [email or web address] 11/20/2019, 4:53 PM

## 2019-11-20 NOTE — Progress Notes (Signed)
MD Sharolyn Douglas paged to be made aware of updated BMP results in Epic.

## 2019-11-20 NOTE — Progress Notes (Signed)
Patient attempted to eat tomato soup and crackers this morning which triggered patient to vomit. Patient only managed to keep ginger ale down at this time. Patient report Phenergan to be more effective than Zofran to help alleviate n/v. Patient currently resting at this time, will continue to monitor.

## 2019-11-20 NOTE — Progress Notes (Signed)
Pharmacy Antibiotic Note  Joseph Barr is a 24 y.o. male with hx of DM presented to the ED on 11/19/2019 with c/o abdominal pain, n/v/d and hyperglycemia. He was started on vancomycin, cefepime and flagyl for broad coverage on admission for suspected sepsis.   Today, 11/20/2019: - day #2 abx - Tmax 99.2, scr down 11.2 -  scr trending up 1.06 (crcl~100)  -  PCT down 1.60 - Abd/pelvis CT: There are a few small scattered areas of subtle ground-glass opacity within the bilateral lung bases, which may represent an infectious or inflammatory process. Diffuse circumferential thickening and mucosal edema throughout the esophagus, suggestive of esophagitis   Plan: - continue cefepime 2gm IV q8h and metronidazole 500 mg IV q8h - adjust vancomycin dose to 1000 mg IV q12h for est AUC 480 - monitor renal function, clinical status  - f/u with cultures  _______________________________________  Height: 6' (182.9 cm) Weight: 145 lb (65.8 kg) IBW/kg (Calculated) : 77.6  Temp (24hrs), Avg:98.8 F (37.1 C), Min:98.3 F (36.8 C), Max:99.2 F (37.3 C)  Recent Labs  Lab 11/19/19 0100 11/19/19 0100 11/19/19 0520 11/19/19 1237 11/19/19 1939 11/20/19 0023 11/20/19 0829  WBC 16.6*  --   --   --   --  11.2*  --   CREATININE 1.39*   < > 0.97 0.78 0.77 0.88 1.06  LATICACIDVEN 2.3*  --  1.4  --   --   --   --    < > = values in this interval not displayed.    Estimated Creatinine Clearance: 100 mL/min (by C-G formula based on SCr of 1.06 mg/dL).    No Known Allergies  Antimicrobials this admission: 3/20 cefepime >>  3/20 vancomycin >>  3/20 flagyl>>  Dose adjustments this admission: --  Microbiology results: 3/20 BCx x2:  3/20 UCx: neg FINAL   Thank you for allowing pharmacy to be a part of this patient's care.  Lucia Gaskins 11/20/2019 11:11 AM

## 2019-11-20 NOTE — Progress Notes (Addendum)
Inpatient Diabetes Program Recommendations  AACE/ADA: New Consensus Statement on Inpatient Glycemic Control (2015)  Target Ranges:  Prepandial:   less than 140 mg/dL      Peak postprandial:   less than 180 mg/dL (1-2 hours)      Critically ill patients:  140 - 180 mg/dL   Lab Results  Component Value Date   GLUCAP 276 (H) 11/20/2019   HGBA1C 11.6 (H) 11/19/2019    Review of Glycemic Control Results for ARCH, METHOT (MRN [id number]) as of 11/20/2019 10:25  Ref. Range 11/19/2019 22:25 11/20/2019 07:29 11/20/2019 09:35  Glucose-Capillary Latest Ref Range: 70 - 99 mg/dL 835 (H) 844 (H) 652 (H)   Diabetes history: Type 1 Dm Outpatient Diabetes medications: Novolin 70/30 45 units BID Current orders for Inpatient glycemic control: Novolog 3 units TID, Novolog 0-5 units QHS, Lantus 20 units QD, Novolog 0-9 units TID  Inpatient Diabetes Program Recommendations:    Addendum: Noted labs from [facility].  Secure chat sent to MD. Plan to restart IV insulin.   Attempted reaching out to patient x 2 (per patient's cell number and room phone), no answer. DM coordinator working remotely over the weekend. Will reattempt at a later time. Will plan to follow and see 3/22.   Thanks, Lujean Rave, MSN, RNC-OB Diabetes Coordinator [phone] (8a-5p)

## 2019-11-21 LAB — CBC WITH DIFFERENTIAL/PLATELET
Abs Immature Granulocytes: 0.03 10*3/uL (ref 0.00–0.07)
Basophils Absolute: 0 10*3/uL (ref 0.0–0.1)
Basophils Relative: 0 %
Eosinophils Absolute: 0.1 10*3/uL (ref 0.0–0.5)
Eosinophils Relative: 2 %
HCT: 31.6 % — ABNORMAL LOW (ref 39.0–52.0)
Hemoglobin: 10.5 g/dL — ABNORMAL LOW (ref 13.0–17.0)
Immature Granulocytes: 0 %
Lymphocytes Relative: 18 %
Lymphs Abs: 1.4 10*3/uL (ref 0.7–4.0)
MCH: 29.1 pg (ref 26.0–34.0)
MCHC: 33.2 g/dL (ref 30.0–36.0)
MCV: 87.5 fL (ref 80.0–100.0)
Monocytes Absolute: 0.7 10*3/uL (ref 0.1–1.0)
Monocytes Relative: 10 %
Neutro Abs: 5.4 10*3/uL (ref 1.7–7.7)
Neutrophils Relative %: 70 %
Platelets: 255 10*3/uL (ref 150–400)
RBC: 3.61 MIL/uL — ABNORMAL LOW (ref 4.22–5.81)
RDW: 12.7 % (ref 11.5–15.5)
WBC: 7.7 10*3/uL (ref 4.0–10.5)
nRBC: 0 % (ref 0.0–0.2)

## 2019-11-21 LAB — BASIC METABOLIC PANEL
Anion gap: 9 (ref 5–15)
Anion gap: 12 (ref 5–15)
Anion gap: 10 (ref 5–15)
Anion gap: 8 (ref 5–15)
BUN: 8 mg/dL (ref 6–20)
BUN: 7 mg/dL (ref 6–20)
BUN: 6 mg/dL (ref 6–20)
BUN: 6 mg/dL (ref 6–20)
CO2: 21 mmol/L — ABNORMAL LOW (ref 22–32)
CO2: 19 mmol/L — ABNORMAL LOW (ref 22–32)
CO2: 21 mmol/L — ABNORMAL LOW (ref 22–32)
CO2: 24 mmol/L (ref 22–32)
Calcium: 8.4 mg/dL — ABNORMAL LOW (ref 8.9–10.3)
Calcium: 8.2 mg/dL — ABNORMAL LOW (ref 8.9–10.3)
Calcium: 8.4 mg/dL — ABNORMAL LOW (ref 8.9–10.3)
Calcium: 8.1 mg/dL — ABNORMAL LOW (ref 8.9–10.3)
Chloride: 109 mmol/L (ref 98–111)
Chloride: 106 mmol/L (ref 98–111)
Chloride: 104 mmol/L (ref 98–111)
Chloride: 105 mmol/L (ref 98–111)
Creatinine, Ser: 0.66 mg/dL (ref 0.61–1.24)
Creatinine, Ser: 0.63 mg/dL (ref 0.61–1.24)
Creatinine, Ser: 0.68 mg/dL (ref 0.61–1.24)
Creatinine, Ser: 0.57 mg/dL — ABNORMAL LOW (ref 0.61–1.24)
GFR calc Af Amer: >60 mL/min (ref >60)
GFR calc Af Amer: >60 mL/min (ref >60)
GFR calc Af Amer: >60 mL/min (ref >60)
GFR calc Af Amer: >60 mL/min (ref >60)
GFR calc non Af Amer: >60 mL/min (ref >60)
GFR calc non Af Amer: >60 mL/min (ref >60)
GFR calc non Af Amer: >60 mL/min (ref >60)
GFR calc non Af Amer: >60 mL/min (ref >60)
Glucose, Bld: 122 mg/dL — ABNORMAL HIGH (ref 70–99)
Glucose, Bld: 156 mg/dL — ABNORMAL HIGH (ref 70–99)
Glucose, Bld: 223 mg/dL — ABNORMAL HIGH (ref 70–99)
Glucose, Bld: 146 mg/dL — ABNORMAL HIGH (ref 70–99)
Potassium: 3.1 mmol/L — ABNORMAL LOW (ref 3.5–5.1)
Potassium: 3.6 mmol/L (ref 3.5–5.1)
Potassium: 3.1 mmol/L — ABNORMAL LOW (ref 3.5–5.1)
Potassium: 2.8 mmol/L — ABNORMAL LOW (ref 3.5–5.1)
Sodium: 139 mmol/L (ref 135–145)
Sodium: 137 mmol/L (ref 135–145)
Sodium: 135 mmol/L (ref 135–145)
Sodium: 137 mmol/L (ref 135–145)

## 2019-11-21 LAB — GLUCOSE, CAPILLARY
Glucose-Capillary: 113 mg/dL — ABNORMAL HIGH (ref 70–99)
Glucose-Capillary: 118 mg/dL — ABNORMAL HIGH (ref 70–99)
Glucose-Capillary: 127 mg/dL — ABNORMAL HIGH (ref 70–99)
Glucose-Capillary: 138 mg/dL — ABNORMAL HIGH (ref 70–99)
Glucose-Capillary: 139 mg/dL — ABNORMAL HIGH (ref 70–99)
Glucose-Capillary: 143 mg/dL — ABNORMAL HIGH (ref 70–99)
Glucose-Capillary: 152 mg/dL — ABNORMAL HIGH (ref 70–99)
Glucose-Capillary: 160 mg/dL — ABNORMAL HIGH (ref 70–99)
Glucose-Capillary: 205 mg/dL — ABNORMAL HIGH (ref 70–99)

## 2019-11-21 LAB — BETA-HYDROXYBUTYRIC ACID: Beta-Hydroxybutyric Acid: 1.99 mmol/L — ABNORMAL HIGH (ref 0.05–0.27)

## 2019-11-21 LAB — MAGNESIUM: Magnesium: 1.8 mg/dL (ref 1.7–2.4)

## 2019-11-21 LAB — MRSA PCR SCREENING: MRSA by PCR: POSITIVE — AB

## 2019-11-21 LAB — PROCALCITONIN: Procalcitonin: 0.77 ng/mL

## 2019-11-21 MED ORDER — POTASSIUM CHLORIDE 10 MEQ/100ML IV SOLN
10.0000 meq | INTRAVENOUS | Status: DC
  Administered 2019-11-21: 10 meq via INTRAVENOUS
  Filled 2019-11-21 (×4): qty 100

## 2019-11-21 MED ORDER — INSULIN ASPART 100 UNIT/ML ~~LOC~~ SOLN
4.0000 [IU] | Freq: Once | SUBCUTANEOUS | Status: AC
  Administered 2019-11-21: 4 [IU] via SUBCUTANEOUS

## 2019-11-21 MED ORDER — MAGNESIUM SULFATE 2 GM/50ML IV SOLN
2.0000 g | Freq: Once | INTRAVENOUS | Status: AC
  Administered 2019-11-21: 2 g via INTRAVENOUS
  Filled 2019-11-21: qty 50

## 2019-11-21 MED ORDER — INSULIN GLARGINE 100 UNIT/ML ~~LOC~~ SOLN
10.0000 [IU] | Freq: Two times a day (BID) | SUBCUTANEOUS | Status: DC
  Administered 2019-11-21: 10 [IU] via SUBCUTANEOUS
  Filled 2019-11-21 (×2): qty 0.1

## 2019-11-21 MED ORDER — INSULIN ASPART 100 UNIT/ML ~~LOC~~ SOLN
8.0000 [IU] | Freq: Three times a day (TID) | SUBCUTANEOUS | Status: DC
  Administered 2019-11-21 – 2019-11-22 (×3): 8 [IU] via SUBCUTANEOUS

## 2019-11-21 MED ORDER — POTASSIUM CHLORIDE 10 MEQ/100ML IV SOLN
10.0000 meq | INTRAVENOUS | Status: AC
  Administered 2019-11-21 (×4): 10 meq via INTRAVENOUS
  Filled 2019-11-21 (×4): qty 100

## 2019-11-21 MED ORDER — INSULIN GLARGINE 100 UNIT/ML ~~LOC~~ SOLN
15.0000 [IU] | Freq: Two times a day (BID) | SUBCUTANEOUS | Status: DC
  Administered 2019-11-21 – 2019-11-22 (×2): 15 [IU] via SUBCUTANEOUS
  Filled 2019-11-21 (×3): qty 0.15

## 2019-11-21 MED ORDER — INSULIN ASPART 100 UNIT/ML ~~LOC~~ SOLN
0.0000 [IU] | SUBCUTANEOUS | Status: DC
  Administered 2019-11-21: 1 [IU] via SUBCUTANEOUS
  Administered 2019-11-22: 2 [IU] via SUBCUTANEOUS
  Administered 2019-11-22: 3 [IU] via SUBCUTANEOUS
  Administered 2019-11-22: 1 [IU] via SUBCUTANEOUS

## 2019-11-21 MED ORDER — INSULIN ASPART 100 UNIT/ML ~~LOC~~ SOLN: 0.0000 [IU] | Freq: Every day | SUBCUTANEOUS | Status: DC

## 2019-11-21 MED ORDER — INSULIN ASPART 100 UNIT/ML ~~LOC~~ SOLN
3.0000 [IU] | Freq: Three times a day (TID) | SUBCUTANEOUS | Status: DC
  Administered 2019-11-21: 3 [IU] via SUBCUTANEOUS

## 2019-11-21 MED ORDER — POTASSIUM CHLORIDE 20 MEQ PO PACK
40.0000 meq | PACK | Freq: Once | ORAL | Status: DC
  Filled 2019-11-21: qty 2

## 2019-11-21 MED ORDER — ORAL CARE MOUTH RINSE
15.0000 mL | Freq: Two times a day (BID) | OROMUCOSAL | Status: DC
  Administered 2019-11-22 (×2): 15 mL via OROMUCOSAL

## 2019-11-21 MED ORDER — INSULIN GLARGINE 100 UNIT/ML ~~LOC~~ SOLN
15.0000 [IU] | Freq: Two times a day (BID) | SUBCUTANEOUS | Status: DC
  Filled 2019-11-21: qty 0.15

## 2019-11-21 MED ORDER — MUPIROCIN 2 % EX OINT
1.0000 "application " | TOPICAL_OINTMENT | Freq: Two times a day (BID) | CUTANEOUS | Status: DC
  Administered 2019-11-21 – 2019-11-22 (×3): 1 via NASAL
  Filled 2019-11-21 (×2): qty 22

## 2019-11-21 MED ORDER — INSULIN ASPART 100 UNIT/ML ~~LOC~~ SOLN
0.0000 [IU] | Freq: Three times a day (TID) | SUBCUTANEOUS | Status: DC
  Administered 2019-11-21: 2 [IU] via SUBCUTANEOUS
  Administered 2019-11-21: 3 [IU] via SUBCUTANEOUS

## 2019-11-21 NOTE — Progress Notes (Signed)
PROGRESS NOTE  Joseph Barr NID:[id number] DOB: Feb 14, 1996 DOA: 11/19/2019 PCP: Alfonse Spruce, FNP  HPI/Recap of past 24 hours: HPI from Dr Carlyle Basques is a 24 y.o. male with medical history significant for type 1 diabetes mellitus, diagnosed at age 65, now presenting to the emergency department with 4 days of abdominal discomfort, nausea, vomiting, loose stools. Upon arrival to the ED, patient is found to be febrile to 38 C, saturating mid 90s on room air, tachycardic to the 140s, and with stable blood pressure.  EKG features sinus tachycardia with rate 142 and PVC.  Chest x-ray is a normal study.  Chemistry panel serum glucose 509, bicarbonate 21, anion gap 19, and creatinine 1.39, up from 0.98 in 2018.  CBC with leukocytosis to 16,600.  Lactic acid is 2.3.  Beta hydroxybutyrate is elevated to 5.17. Urinalysis notable for ketones.  Blood and urine cultures were collected, patient was started on insulin infusion, cefepime, vancomycin, and Flagyl.  COVID-19 PCR test is negative.  Patient admitted for further management.     Overnight, patient's insulin drip discontinued prematurely and appropriate transition to subcu insulin was not done as well.  Repeat labs showed patient still acidotic with increased anion gap.  Currently labs improving as appropriate transition with Lantus, scheduled 3 times daily NovoLog, SSI, bedtime coverage was initiated with adequate IV fluids.  Patient able to tolerate clear liquid diet, plan to advance gradually as tolerated.  Patient denies any further nausea/vomiting, abdominal pain, fever/chills, chest pain, shortness of breath.     Assessment/Plan: Principal Problem:   DKA (diabetic ketoacidoses) (HCC) Active Problems:   SIRS (systemic inflammatory response syndrome) (HCC)   Renal insufficiency   DKA/type 1 diabetes mellitus A1c 11.6 On presentation, serum glucose 509, elevated anion gap, low bicarb, urine ketones positive On 3/21, pt more  lethargic, not able to tolerate orally, repeat BMP showed elevated anion gap, low bicarb Hyperglycemic crisis protocol initiated on 3/21, but was prematurely discontinued early 3/22 and no appropriate transition to subcu insulin was done Continue SSI, Lantus twice daily, NovoLog 3 times daily, hypoglycemic protocol, CBGs every 4 hours for now Continue D5 half-normal saline IV fluids for now Currently able to tolerate clear liquid diet, continue Diabetes coordinator on board, appreciate recs Continue to monitor closely in SDU  Sepsis Unknown etiology, ??gastroenteritis  Febrile, leukocytosis, tachycardic, lactic acidosis on admission Currently afebrile, with resolved leukocytosis UA unremarkable for infection, UC no growth BC x2 NGTD Procalcitonin elevated at 2.81-->1.60--> 0.77 CT A/P/chest negative for PE, noted groundglass opacity within the bilateral lung bases, which may represent infectious or inflammatory process, esophagitis Continue IV fluids Continue IV cefepime, Flagyl, vancomycin (MRSA PCR +), plan to downgrade once significant overall clinical improvement  Esophagitis IV Protonix twice daily  AKI Resolved  Marijuana abuse Advised to quit ??Cyclical vomiting       Malnutrition Type:      Malnutrition Characteristics:      Nutrition Interventions:       Estimated body mass index is 18.09 kg/m as calculated from the following:   Height as of this encounter: 6' (1.829 m).   Weight as of this encounter: 60.5 kg.     Code Status: Full  Family Communication: Discussed with patient extensively, friend at bedside  Disposition Plan: Patient still requiring IV antibiotics, IV fluids, still critically ill.  Plan to discharge home in 24 to 48 hours.   Consultants:  None  Procedures:  None  Antimicrobials:  Cefepime  Flagyl  Vancomycin  DVT prophylaxis: Lovenox   Objective: Vitals:   11/21/19 0600 11/21/19 0700 11/21/19 0800 11/21/19  1200  BP: 114/66 116/68    Pulse: 90 89    Resp: 16 13    Temp:   98.1 F (36.7 C) 98.3 F (36.8 C)  TempSrc:   Oral Oral  SpO2: 99% 99%    Weight:      Height:        Intake/Output Summary (Last 24 hours) at 11/21/2019 1415 Last data filed at 11/21/2019 0817 Gross per 24 hour  Intake 1645.91 ml  Output 2275 ml  Net -629.09 ml   Filed Weights   11/19/19 0117 11/20/19 2000  Weight: 65.8 kg 60.5 kg    Exam:  General: NAD, acutely ill-appearing, lethargic  Cardiovascular: S1, S2 present  Respiratory: CTAB  Abdomen: Soft, nontender, nondistended, bowel sounds present  Musculoskeletal: No bilateral pedal edema noted  Skin: Normal  Psychiatry: Normal mood   Data Reviewed: CBC: Recent Labs  Lab 11/19/19 0100 11/20/19 0023 11/21/19 0439  WBC 16.6* 11.2* 7.7  NEUTROABS 13.4* 9.1* 5.4  HGB 15.4 11.7* 10.5*  HCT 43.8 34.1* 31.6*  MCV 81.6 85.5 87.5  PLT 286 233 255   Basic Metabolic Panel: Recent Labs  Lab 11/19/19 0100 11/19/19 0520 11/20/19 1855 11/20/19 2248 11/21/19 0058 11/21/19 0331 11/21/19 0746 11/21/19 1326  NA 129*   < > 136 135  --  139 137 135  K 4.4   < > 4.0 3.4*  --  3.1* 3.6 3.1*  CL 89*   < > 107 106  --  109 106 104  CO2 21*   < > 15* 18*  --  21* 19* 21*  GLUCOSE 509*   < > 163* 177*  --  122* 156* 223*  BUN 24*   < > 10 9  --  8 7 6   CREATININE 1.39*   < > 0.73 0.71  --  0.66 0.63 0.68  CALCIUM 9.2   < > 8.3* 8.3*  --  8.4* 8.2* 8.4*  MG 1.9  --   --   --  1.8  --   --   --   PHOS 2.6  --   --   --   --   --   --   --    < > = values in this interval not displayed.   GFR: Estimated Creatinine Clearance: 121.8 mL/min (by C-G formula based on SCr of 0.68 mg/dL). Liver Function Tests: Recent Labs  Lab 11/19/19 0100  AST 17  ALT 18  ALKPHOS 78  BILITOT 1.4*  PROT 8.1  ALBUMIN 4.1   Recent Labs  Lab 11/19/19 0100  LIPASE 12   No results for input(s): AMMONIA in the last 168 hours. Coagulation Profile: Recent Labs    Lab 11/19/19 0100  INR 1.1   Cardiac Enzymes: No results for input(s): CKTOTAL, CKMB, CKMBINDEX, TROPONINI in the last 168 hours. BNP (last 3 results) No results for input(s): PROBNP in the last 8760 hours. HbA1C: Recent Labs    11/19/19 0822  HGBA1C 11.6*   CBG: Recent Labs  Lab 11/21/19 0051 11/21/19 0152 11/21/19 0250 11/21/19 0747 11/21/19 1205  GLUCAP 139* 113* 118* 143* 205*   Lipid Profile: No results for input(s): CHOL, HDL, LDLCALC, TRIG, CHOLHDL, LDLDIRECT in the last 72 hours. Thyroid Function Tests: No results for input(s): TSH, T4TOTAL, FREET4, T3FREE, THYROIDAB in the last 72 hours. Anemia Panel: No results for input(s): VITAMINB12, FOLATE,  FERRITIN, TIBC, IRON, RETICCTPCT in the last 72 hours. Urine analysis:    Component Value Date/Time   COLORURINE STRAW (A) 11/19/2019 0100   APPEARANCEUR CLEAR 11/19/2019 0100   LABSPEC 1.024 11/19/2019 0100   PHURINE 6.0 11/19/2019 0100   GLUCOSEU >=500 (A) 11/19/2019 0100   HGBUR SMALL (A) 11/19/2019 0100   BILIRUBINUR NEGATIVE 11/19/2019 0100   BILIRUBINUR Negative 08/01/2016 0950   KETONESUR 80 (A) 11/19/2019 0100   PROTEINUR NEGATIVE 11/19/2019 0100   UROBILINOGEN 0.2 08/01/2016 0950   UROBILINOGEN 0.2 04/24/2015 1305   NITRITE NEGATIVE 11/19/2019 0100   LEUKOCYTESUR NEGATIVE 11/19/2019 0100   Sepsis Labs: @LABRCNTIP (procalcitonin:4,lacticidven:4)  ) Recent Results (from the past 240 hour(s))  Urine culture     Status: None   Collection Time: 11/19/19  1:00 AM   Specimen: In/Out Cath Urine  Result Value Ref Range Status   Specimen Description IN/OUT CATH URINE  Final   Special Requests   Final    NONE Performed at [facility], [address], [place], [place]    Culture NO GROWTH  Final   Report Status 11/20/2019 FINAL  Final  SARS CORONAVIRUS 2 (TAT 6-24 HRS) Nasopharyngeal Nasopharyngeal Swab     Status: None   Collection Time: 11/19/19  1:07 AM   Specimen:  Nasopharyngeal Swab  Result Value Ref Range Status   SARS Coronavirus 2 NEGATIVE NEGATIVE Final    Comment: (NOTE) SARS-CoV-2 target nucleic acids are NOT DETECTED. The SARS-CoV-2 RNA is generally detectable in upper and lower respiratory specimens during the acute phase of infection. Negative results do not preclude SARS-CoV-2 infection, do not rule out co-infections with other pathogens, and should not be used as the sole basis for treatment or other patient management decisions. Negative results must be combined with clinical observations, patient history, and epidemiological information. The expected result is Negative. Fact Sheet for Patients: 11/21/19 Fact Sheet for Healthcare Providers: HairSlick.no This test is not yet approved or cleared by the quierodirigir.com FDA and  has been authorized for detection and/or diagnosis of SARS-CoV-2 by FDA under an Emergency Use Authorization (EUA). This EUA will remain  in effect (meaning this test can be used) for the duration of the COVID-19 declaration under Section 56 4(b)(1) of the Act, 21 U.S.C. section 360bbb-3(b)(1), unless the authorization is terminated or revoked sooner. Performed at [facility], [address] [place], [place]   Blood Culture (routine x 2)     Status: None (Preliminary result)   Collection Time: 11/19/19  1:25 AM   Specimen: BLOOD  Result Value Ref Range Status   Specimen Description   Final    BLOOD RIGHT ANTECUBITAL Performed at [facility], [address] [place], [place]    Special Requests   Final    BOTTLES DRAWN AEROBIC AND ANAEROBIC Blood Culture adequate volume Performed at [facility], [address], [place], [place]    Culture   Final    NO GROWTH 2 DAYS Performed at [facility], [address] [place], [place]    Report Status PENDING  Incomplete    Blood Culture (routine x 2)     Status: None (Preliminary result)   Collection Time: 11/19/19  1:40 AM   Specimen: BLOOD  Result Value Ref Range Status   Specimen Description   Final    BLOOD BLOOD LEFT FOREARM Performed at [facility], [address],  [place], [place] [address]    Special Requests   Final    BOTTLES DRAWN AEROBIC AND ANAEROBIC Blood Culture results may not be optimal due to an inadequate volume of blood received in culture bottles Performed at [facility], [address], [place], [place] [address]    Culture   Final    NO GROWTH 2 DAYS Performed at [facility], [address], [place], [place] [address]    Report Status PENDING  Incomplete  MRSA PCR Screening     Status: Abnormal   Collection Time: 11/19/19  3:12 PM   Specimen: Nasopharyngeal  Result Value Ref Range Status   MRSA by PCR POSITIVE (A) NEGATIVE Final    Comment:        The GeneXpert MRSA Assay (FDA approved for NASAL specimens only), is one component of a comprehensive MRSA colonization surveillance program. It is not intended to diagnose MRSA infection nor to guide or monitor treatment for MRSA infections. RESULT CALLED TO, READ BACK BY AND VERIFIED WITH: GOIN, L. @ 1051 11/21/2019 PERRY, J. Performed at [facility], [address] [place], [place] [address]       Studies: No results found.  Scheduled Meds: . Chlorhexidine Gluconate Cloth  6 each Topical Daily  . enoxaparin (LOVENOX) injection  40 mg Subcutaneous Q24H  . insulin aspart  0-5 Units Subcutaneous QHS  . insulin aspart  0-9 Units Subcutaneous TID WC  . insulin aspart  8 Units Subcutaneous TID WC  . insulin glargine  15 Units Subcutaneous BID  . mupirocin ointment  1 application Nasal BID  . pantoprazole (PROTONIX) IV  40 mg Intravenous Q12H    Continuous Infusions: . sodium chloride Stopped (11/20/19 2000)  . ceFEPime (MAXIPIME) IV Stopped (11/21/19 1213)  .  dextrose 5 % and 0.45% NaCl 125 mL/hr at 11/21/19 1239  . metronidazole Stopped (11/21/19 1338)  . vancomycin Stopped (11/21/19 1112)     LOS: 2 days     Briant Cedar, MD [facility]  If 7PM-7AM, please contact night-coverage [email or web address] 11/21/2019, 2:15 PM

## 2019-11-21 NOTE — Progress Notes (Signed)
Inpatient Diabetes Program Recommendations  AACE/ADA: New Consensus Statement on Inpatient Glycemic Control (2015)  Target Ranges:  Prepandial:   less than 140 mg/dL      Peak postprandial:   less than 180 mg/dL (1-2 hours)      Critically ill patients:  140 - 180 mg/dL   Lab Results  Component Value Date   GLUCAP 205 (H) 11/21/2019   HGBA1C 11.6 (H) 11/19/2019    Review of Glycemic Control  Diabetes history: DM1 Outpatient Diabetes medications: Novolin 70/30 45 units tid Current orders for Inpatient glycemic control: IV insulin Transitioning to Lantus 10 units bid, Novolog 0-9 units tidwc and hs + 3 units tidwc  Needs PCP, has not had MD since age 1. Gets insulin at [facility]. States he has taken 70/30 45 units tid for years. (Equates to 31.5 units long-acting and 13.5 units regular insulin tid) Rarely has hypoglycemia. Pt eats small breakfast and variety of foods at lunch and dinner.  Inpatient Diabetes Program Recommendations:     Increase Novolog to 8 units tidwc for meal coverage insulin. Will likely need more Lantus - titrate according to FBS.   Will f/u in am.  Labs just drawn - will f/u. Discussed above with MD and RN.  Thank you. Ailene Ards, RD, LDN, CDE Inpatient Diabetes Coordinator [phone]

## 2019-11-21 NOTE — TOC Initial Note (Addendum)
Transition of Care [facility]) - Initial/Assessment Note    Patient Details  Name: Joseph Barr MRN: [id number] Date of Birth: 1996-02-26  Transition [facility] CM/SW Contact:    Lennart Pall, LCSW Phone Number: 11/21/2019, 2:27 PM  Clinical Narrative:  Asked to assist patient with establishing primary care provider.  Pt confirms that he has been followed in the past at [facility], however, per chart review, no visits since 2017.  In process of communicating with [facility] to see if we can get him re-established or if will need to refer to another local medical clinic.  Hope to have this resolved prior to d/c.  Pt very hopeful he will be medically ready for d/c tomorrow with plans to return home with girlfriend and her parents.                 Expected Discharge Plan: Home/Self Care Barriers to Discharge: Continued Medical Work up   Patient Goals and CMS Choice Patient states their goals for this hospitalization and ongoing recovery are:: "be home by tomorrow"      Expected Discharge Plan and Services Expected Discharge Plan: Home/Self Care   Discharge Planning Services: [facility], Medication Assistance, [facility]   Living arrangements for the past 2 months: Single Family Home(living with his girlfriend and her parents)                                      Prior Living Arrangements/Services Living arrangements for the past 2 months: Single Family Home(living with his girlfriend and her parents) Lives with:: Significant Other Patient language and need for interpreter reviewed:: Yes Do you feel safe going back to the place where you live?: Yes      Need for Family Participation in Patient Care: No (Comment) Care giver support system in place?: Yes (comment)   Criminal Activity/Legal Involvement Pertinent to Current Situation/Hospitalization: No - Comment as needed  Activities of Daily Living Home Assistive Devices/Equipment: None ADL  Screening (condition at time of admission) Patient's cognitive ability adequate to safely complete daily activities?: Yes Is the patient deaf or have difficulty hearing?: Yes Does the patient have difficulty seeing, even when wearing glasses/contacts?: No Does the patient have difficulty concentrating, remembering, or making decisions?: No Patient able to express need for assistance with ADLs?: Yes Does the patient have difficulty dressing or bathing?: No Independently performs ADLs?: Yes (appropriate for developmental age) Does the patient have difficulty walking or climbing stairs?: No Weakness of Legs: None Weakness of Arms/Hands: None  Permission Sought/Granted Permission sought to share information with : PCP, Family Supports Permission granted to share information with : Yes, Verbal Permission Granted  Share Information with NAME: Joseph Barr  Permission granted to share info w AGENCY: [facility] (or other health clinic) for process of establishing primary care home)  Permission granted to share info w Relationship: mother  Permission granted to share info w Contact Information: [phone]  Emotional Assessment Appearance:: Appears stated age Attitude/Demeanor/Rapport: Gracious, Engaged Affect (typically observed): Stable, Pleasant Orientation: : Oriented to Self, Oriented to Place, Oriented to  Time, Oriented to Situation Alcohol / Substance Use: Not Applicable Psych Involvement: No (comment)  Admission diagnosis:  DKA (diabetic ketoacidoses) ([facility]) [E11.10] Hyperglycemia [R73.9] Acute kidney injury (nontraumatic) (HCC) [R51.8] Metabolic acidosis, increased anion gap [E87.2] Sepsis due to undetermined organism ([place]) [A41.9] Non-intractable vomiting with nausea, unspecified vomiting  type [R11.2] Patient Active Problem List   Diagnosis Date Noted  . SIRS (systemic inflammatory response syndrome) (HCC) 11/19/2019  . Renal insufficiency 11/19/2019  .  Hyperkalemia 06/11/2017  . Tobacco abuse 06/11/2017  . Chest pain 06/11/2017  . Increased anion gap metabolic acidosis 06/11/2017  . DKA (diabetic ketoacidoses) (HCC) 04/24/2015  . DKA, type 1 (HCC) 04/24/2015  . AKI (acute kidney injury) (HCC) 04/24/2015  . Nausea with vomiting 12/06/2014  . Diabetes mellitus type I (HCC) 01/21/2012  . Diabetes mellitus type 1 (HCC) 01/06/2012  . Hyperglycemia 01/06/2012  . Ketonuria 01/06/2012  . Headache(784.0) 01/06/2012   PCP:  Lizbeth Bark, FNP Pharmacy:   [address] [place], [place] - [address] [place] [address] Phone: [phone] Fax: [phone]  [facility] [place], [place] - [address] [place] [address] Phone: [phone] Fax: [phone]  [id number] - [place], [place] - [address] [place] [address] Phone: [phone] Fax: [phone]  [facility] [address] [place] - [address] [place] [address] Phone: [phone] Fax: [phone]     Social Determinants of Health (SDOH) Interventions    Readmission Risk Interventions Readmission Risk Prevention Plan 11/21/2019  Post Dischage Appt Not Complete  Appt Comments continue to work to re-establish pt at [facility] vs alternate health clinic  Medication Screening Complete  Transportation Screening Complete  Some recent data might be hidden

## 2019-11-22 DIAGNOSIS — A419 Sepsis, unspecified organism: Principal | ICD-10-CM

## 2019-11-22 LAB — BASIC METABOLIC PANEL
Anion gap: 8 (ref 5–15)
BUN: 5 mg/dL — ABNORMAL LOW (ref 6–20)
CO2: 26 mmol/L (ref 22–32)
Calcium: 8.3 mg/dL — ABNORMAL LOW (ref 8.9–10.3)
Chloride: 109 mmol/L (ref 98–111)
Creatinine, Ser: 0.52 mg/dL — ABNORMAL LOW (ref 0.61–1.24)
GFR calc Af Amer: >60 mL/min (ref >60)
GFR calc non Af Amer: >60 mL/min (ref >60)
Glucose, Bld: 97 mg/dL (ref 70–99)
Potassium: 2.9 mmol/L — ABNORMAL LOW (ref 3.5–5.1)
Sodium: 143 mmol/L (ref 135–145)

## 2019-11-22 LAB — CBC WITH DIFFERENTIAL/PLATELET
Abs Immature Granulocytes: 0.03 10*3/uL (ref 0.00–0.07)
Basophils Absolute: 0 10*3/uL (ref 0.0–0.1)
Basophils Relative: 0 %
Eosinophils Absolute: 0.1 10*3/uL (ref 0.0–0.5)
Eosinophils Relative: 1 %
HCT: 32.4 % — ABNORMAL LOW (ref 39.0–52.0)
Hemoglobin: 11 g/dL — ABNORMAL LOW (ref 13.0–17.0)
Immature Granulocytes: 0 %
Lymphocytes Relative: 21 %
Lymphs Abs: 1.6 10*3/uL (ref 0.7–4.0)
MCH: 28.8 pg (ref 26.0–34.0)
MCHC: 34 g/dL (ref 30.0–36.0)
MCV: 84.8 fL (ref 80.0–100.0)
Monocytes Absolute: 0.7 10*3/uL (ref 0.1–1.0)
Monocytes Relative: 10 %
Neutro Abs: 4.9 10*3/uL (ref 1.7–7.7)
Neutrophils Relative %: 68 %
Platelets: 281 10*3/uL (ref 150–400)
RBC: 3.82 MIL/uL — ABNORMAL LOW (ref 4.22–5.81)
RDW: 12.5 % (ref 11.5–15.5)
WBC: 7.4 10*3/uL (ref 4.0–10.5)
nRBC: 0 % (ref 0.0–0.2)

## 2019-11-22 LAB — GLUCOSE, CAPILLARY
Glucose-Capillary: 115 mg/dL — ABNORMAL HIGH (ref 70–99)
Glucose-Capillary: 95 mg/dL (ref 70–99)
Glucose-Capillary: 156 mg/dL — ABNORMAL HIGH (ref 70–99)
Glucose-Capillary: 213 mg/dL — ABNORMAL HIGH (ref 70–99)
Glucose-Capillary: 93 mg/dL (ref 70–99)

## 2019-11-22 MED ORDER — PANTOPRAZOLE SODIUM 40 MG PO TBEC: 40.0000 mg | DELAYED_RELEASE_TABLET | Freq: Every day | ORAL | 0 refills | Status: DC

## 2019-11-22 MED ORDER — NOVOLOG FLEXPEN 100 UNIT/ML ~~LOC~~ SOPN: 10.0000 [IU] | PEN_INJECTOR | Freq: Three times a day (TID) | SUBCUTANEOUS | 0 refills | Status: DC

## 2019-11-22 MED ORDER — POTASSIUM CHLORIDE CRYS ER 20 MEQ PO TBCR
30.0000 meq | EXTENDED_RELEASE_TABLET | ORAL | Status: AC
  Administered 2019-11-22 (×2): 30 meq via ORAL
  Filled 2019-11-22 (×2): qty 1

## 2019-11-22 MED ORDER — CEFDINIR 300 MG PO CAPS: 300.0000 mg | ORAL_CAPSULE | Freq: Two times a day (BID) | ORAL | 0 refills | Status: AC

## 2019-11-22 MED ORDER — "PEN NEEDLES 3/16"" 31G X 5 MM MISC": 100.0000 | Freq: Three times a day (TID) | 0 refills | Status: DC

## 2019-11-22 MED ORDER — POTASSIUM CHLORIDE CRYS ER 20 MEQ PO TBCR
20.0000 meq | EXTENDED_RELEASE_TABLET | Freq: Once | ORAL | Status: AC
  Administered 2019-11-22: 20 meq via ORAL
  Filled 2019-11-22: qty 1

## 2019-11-22 MED ORDER — LANTUS SOLOSTAR 100 UNIT/ML ~~LOC~~ SOPN: 30.0000 [IU] | PEN_INJECTOR | Freq: Every day | SUBCUTANEOUS | 0 refills | Status: DC

## 2019-11-22 NOTE — Discharge Instructions (Signed)
Acute Kidney Injury, Adult  Acute kidney injury is a sudden worsening of kidney function. The kidneys are organs that have several jobs. They filter the blood to remove waste products and extra fluid. They also maintain a healthy balance of minerals and hormones in the body, which helps control blood pressure and keep bones strong. With this condition, your kidneys do not do their jobs as well as they should. This condition ranges from mild to severe. Over time it may develop into long-lasting (chronic) kidney disease. Early detection and treatment may prevent acute kidney injury from developing into a chronic condition. What are the causes? Common causes of this condition include:  A problem with blood flow to the kidneys. This may be caused by: ? Low blood pressure (hypotension) or shock. ? Blood loss. ? Heart and blood vessel (cardiovascular) disease. ? Severe burns. ? Liver disease.  Direct damage to the kidneys. This may be caused by: ? Certain medicines. ? A kidney infection. ? Poisoning. ? Being around or in contact with toxic substances. ? A surgical wound. ? A hard, direct hit to the kidney area.  A sudden blockage of urine flow. This may be caused by: ? Cancer. ? Kidney stones. ? An enlarged prostate in males. What are the signs or symptoms? Symptoms of this condition may not be obvious until the condition becomes severe. Symptoms of this condition can include:  Tiredness (lethargy), or difficulty staying awake.  Nausea or vomiting.  Swelling (edema) of the face, legs, ankles, or feet.  Problems with urination, such as: ? Abdominal pain, or pain along the side of your stomach (flank). ? Decreased urine production. ? Decrease in the force of urine flow.  Muscle twitches and cramps, especially in the legs.  Confusion or trouble concentrating.  Loss of appetite.  Fever. How is this diagnosed? This condition may be diagnosed with tests, including:  Blood  tests.  Urine tests.  Imaging tests.  A test in which a sample of tissue is removed from the kidneys to be examined under a microscope (kidney biopsy). How is this treated? Treatment for this condition depends on the cause and how severe the condition is. In mild cases, treatment may not be needed. The kidneys may heal on their own. In more severe cases, treatment will involve:  Treating the cause of the kidney injury. This may involve changing any medicines you are taking or adjusting your dosage.  Fluids. You may need specialized IV fluids to balance your body's needs.  Having a catheter placed to drain urine and prevent blockages.  Preventing problems from occurring. This may mean avoiding certain medicines or procedures that can cause further injury to the kidneys. In some cases treatment may also require:  A procedure to remove toxic wastes from the body (dialysis or continuous renal replacement therapy - CRRT).  Surgery. This may be done to repair a torn kidney, or to remove the blockage from the urinary system. Follow these instructions at home: Medicines  Take over-the-counter and prescription medicines only as told by your health care provider.  Do not take any new medicines without your health care provider's approval. Many medicines can worsen your kidney damage.  Do not take any vitamin and mineral supplements without your health care provider's approval. Many nutritional supplements can worsen your kidney damage. Lifestyle  If your health care provider prescribed changes to your diet, follow them. You may need to decrease the amount of protein you eat.  Achieve and maintain a healthy   weight. If you need help with this, ask your health care provider.  Start or continue an exercise plan. Try to exercise at least 30 minutes a day, 5 days a week.  Do not use any tobacco products, such as cigarettes, chewing tobacco, and e-cigarettes. If you need help quitting, ask your  health care provider. General instructions  Keep track of your blood pressure. Report changes in your blood pressure as told by your health care provider.  Stay up to date with immunizations. Ask your health care provider which immunizations you need.  Keep all follow-up visits as told by your health care provider. This is important. Where to find more information  American Association of Kidney Patients: ResidentialShow.is  [employer]: www.kidney.org  [employer]: FightingMatch.com.ee  [employer] [facility]: ? www.lifeoptions.org ? www.kidneyschool.org Contact a health care provider if:  Your symptoms get worse.  You develop new symptoms. Get help right away if:  You develop symptoms of worsening kidney disease, which include: ? Headaches. ? Abnormally dark or light skin. ? Easy bruising. ? Frequent hiccups. ? Chest pain. ? Shortness of breath. ? End of menstruation in women. ? Seizures. ? Confusion or altered mental status. ? Abdominal or back pain. ? Itchiness.  You have a fever.  Your body is producing less urine.  You have pain or bleeding when you urinate. Summary  Acute kidney injury is a sudden worsening of kidney function.  Acute kidney injury can be caused by problems with blood flow to the kidneys, direct damage to the kidneys, and sudden blockage of urine flow.  Symptoms of this condition may not be obvious until it becomes severe. Symptoms may include edema, lethargy, confusion, nausea or vomiting, and problems passing urine.  This condition can usually be diagnosed with blood tests, urine tests, and imaging tests. Sometimes a kidney biopsy is done to diagnose this condition.  Treatment for this condition often involves treating the underlying cause. It is treated with fluids, medicines, dialysis, diet changes, or surgery. This information is not intended to replace advice given to you by your health care provider. Make  sure you discuss any questions you have with your health care provider. Document Revised: 07/31/2017 Document Reviewed: 08/08/2016 Elsevier Patient Education  2020 [employer]   Hyperglycemia Hyperglycemia is when the sugar (glucose) level in your blood is too high. It may not cause symptoms. If you do have symptoms, they may include warning signs, such as:  Feeling more thirsty than normal.  Hunger.  Feeling tired.  Needing to pee (urinate) more than normal.  Blurry eyesight (vision). You may get other symptoms as it gets worse, such as:  Dry mouth.  Not being hungry (loss of appetite).  Fruity-smelling breath.  Weakness.  Weight gain or loss that is not planned. Weight loss may be fast.  A tingling or numb feeling in your hands or feet.  Headache.  Skin that does not bounce back quickly when it is lightly pinched and released (poor skin turgor).  Pain in your belly (abdomen).  Cuts or bruises that heal slowly. High blood sugar can happen to people who do or do not have diabetes. High blood sugar can happen slowly or quickly, and it can be an emergency. Follow these instructions at home: General instructions  Take over-the-counter and prescription medicines only as told by your doctor.  Do not use products that contain nicotine or tobacco, such as cigarettes and e-cigarettes. If you need help quitting, ask your doctor.  Limit alcohol intake to no more than 1 drink per day for nonpregnant women and 2 drinks per day for men. One drink equals 12 oz of beer, 5 oz of wine, or 1 oz of hard liquor.  Manage stress. If you need help with this, ask your doctor.  Keep all follow-up visits as told by your doctor. This is important. Eating and drinking   Stay at a healthy weight.  Exercise regularly, as told by your doctor.  Drink enough fluid, especially when you: ? Exercise. ? Get sick. ? Are in hot temperatures.  Eat healthy foods, such as: ? Low-fat (lean)  proteins. ? Complex carbs (complex carbohydrates), such as whole wheat bread or brown rice. ? Fresh fruits and vegetables. ? Low-fat dairy products. ? Healthy fats.  Drink enough fluid to keep your pee (urine) clear or pale yellow. If you have diabetes:   Make sure you know the symptoms of hyperglycemia.  Follow your diabetes management plan, as told by your doctor. Make sure you: ? Take insulin and medicines as told. ? Follow your exercise plan. ? Follow your meal plan. Eat on time. Do not skip meals. ? Check your blood sugar as often as told. Make sure to check before and after exercise. If you exercise longer or in a different way than you normally do, check your blood sugar more often. ? Follow your sick day plan whenever you cannot eat or drink normally. Make this plan ahead of time with your doctor.  Share your diabetes management plan with people in your workplace, school, and household.  Check your urine for ketones when you are ill and as told by your doctor.  Carry a card or wear jewelry that says that you have diabetes. Contact a doctor if:  Your blood sugar level is higher than 240 mg/dL (13.3 mmol/L) for 2 days in a row.  You have problems keeping your blood sugar in your target range.  High blood sugar happens often for you. Get help right away if:  You have trouble breathing.  You have a change in how you think, feel, or act (mental status).  You feel sick to your stomach (nauseous), and that feeling does not go away.  You cannot stop throwing up (vomiting). These symptoms may be an emergency. Do not wait to see if the symptoms will go away. Get medical help right away. Call your local emergency services (911 in the [place] Do not drive yourself to the hospital. Summary  Hyperglycemia is when the sugar (glucose) level in your blood is too high.  High blood sugar can happen to people who do or do not have diabetes.  Make sure you drink enough fluids, eat  healthy foods, and exercise regularly.  Contact your doctor if you have problems keeping your blood sugar in your target range. This information is not intended to replace advice given to you by your health care provider. Make sure you discuss any questions you have with your health care provider. Document Revised: 05/05/2016 Document Reviewed: 05/05/2016 Elsevier Patient Education  [place].

## 2019-11-22 NOTE — Progress Notes (Signed)
Patient arrived from ICU, a/o/v denies pain at this time. Will continue to monitor

## 2019-11-22 NOTE — Progress Notes (Signed)
NP notified Potassium 2.8 after being previously replaced. Pt requests IV replacement vs PO. See new order.  After painful IV administration of 1.5 bags of K, pt requests PO replacement. See new orders.

## 2019-11-22 NOTE — TOC Progression Note (Signed)
Transition of Care [facility]) - Progression Note    Patient Details  Name: Joseph Barr MRN: [id number] Date of Birth: 02-13-1996  [facility] Care [facility] CM/SW Contact  Lennart Pall, LCSW Phone Number: 11/22/2019, 10:33 AM  Clinical Narrative:   Met with pt and girlfriend, Joseph Barr, this morning to confirm I have been able to re-establish his primary care at [facility] (assited by Eden Lathe, RN CM).  Have also updated pt's contact information in the system.  Appointment info placed in d/c instructions.  Pt hopeful for d/c by tomorrow.  May need to assist with medications and will follow up on this later today.    Expected Discharge Plan: Home/Self Care Barriers to Discharge: Continued Medical Work up  Expected Discharge Plan and Services Expected Discharge Plan: Home/Self Care   Discharge Planning Services: [place] [facility], Medication Assistance, [facility]   Living arrangements for the past 2 months: Single Family Home(living with his girlfriend and her parents)                                       Social Determinants of Health (SDOH) Interventions    Readmission Risk Interventions Readmission Risk Prevention Plan 11/21/2019  Post Dischage Appt Not Complete  Appt Comments continue to work to re-establish pt at [facility] vs alternate health clinic  Medication Screening Complete  Transportation Screening Complete  Some recent data might be hidden

## 2019-11-22 NOTE — Discharge Summary (Signed)
Discharge Summary  Joseph Barr OEV:[id number] DOB: Oct 10, 1995  PCP: Alfonse Spruce, FNP  Admit date: 11/19/2019 Discharge date: 11/22/2019  Time spent: 40 mins   Recommendations for Outpatient Follow-up:  1. PCP with [facility] as scheduled  Discharge Diagnoses:  Active Hospital Problems   Diagnosis Date Noted  . DKA (diabetic ketoacidoses) ([facility]) 04/24/2015  . SIRS (systemic inflammatory response syndrome) ([place]) 11/19/2019  . Renal insufficiency 11/19/2019    Resolved Hospital Problems  No resolved problems to display.    Discharge Condition: Stable  Diet recommendation: Moderate carb  Vitals:   11/22/19 0800 11/22/19 0855  BP: (!) 140/94 (!) 132/93  Pulse: 84 83  Resp: 12 20  Temp:  98.5 F (36.9 C)  SpO2: 100% 100%    History of present illness:  Joseph Barr a 23 y.o.malewith medical history significant fortype 1 diabetes mellitus, diagnosed at age 53, now presenting to the emergency department with 4 days of abdominal discomfort, nausea, vomiting, loose stools. Upon arrival to the ED, patient is found to be febrile to 38C,saturating mid 90s on room air, tachycardic to the 140s, and with stable blood pressure. EKG features sinus tachycardia with rate 142 and PVC. Chest x-ray is a normal study. Chemistry panel serum glucose 509, bicarbonate 21, anion gap 19, and creatinine 1.39, up from 0.98 in 2018. CBC with leukocytosis to 16,600. Lactic acid is 2.3. Beta hydroxybutyrate is elevated to 5.17. Urinalysis notable for ketones. Blood and urine cultures were collected, patient was started on insulin infusion, cefepime, vancomycin, and Flagyl. COVID-19 PCR test is negative.  Patient admitted for further management.    Today, patient denies any new complaints.  Denies any worsening abdominal pain, nausea/vomiting, fever/chills, chest pain, shortness of breath, diarrhea.  Patient was able to tolerate his full liquid diet as well as moderate carb  diet without any discomfort.  Patient instructed to follow-up with [facility] to reestablish care with a PCP.    Hospital Course:  Principal Problem:   DKA (diabetic ketoacidoses) ([facility]) Active Problems:   SIRS (systemic inflammatory response syndrome) ([facility])   Renal insufficiency   DKA/type 1 diabetes mellitus A1c 11.6 DKA resolved On presentation, serum glucose 509, elevated anion gap, low bicarb, urine ketones positive Discussed with diabetes coordinator, recommend on discharge 30 units of Lantus, 10 units of NovoLog 3 times daily via insulin pen Follow-up with PCP  Sepsis Unknown etiology, ??  Viral gastroenteritis  Febrile, leukocytosis, tachycardic, lactic acidosis on admission Currently afebrile, with resolved leukocytosis UA unremarkable for infection, UC no growth BC x2 NGTD Procalcitonin elevated at 2.81-->1.60--> 0.77 CT A/P/chest negative for PE, noted groundglass opacity within the bilateral lung bases, which may represent infectious or inflammatory process, esophagitis Status post IV cefepime, Flagyl, vancomycin x4 days, will discharge on 1 more day of cefdinir to complete 5 days of antibiotics, as no source source of infection was identified Follow-up with PCP  Esophagitis Discharge on daily Protonix  AKI Resolved  Marijuana abuse Advised to quit ??Cyclical vomiting          Malnutrition Type:      Malnutrition Characteristics:      Nutrition Interventions:      Estimated body mass index is 18.09 kg/m as calculated from the following:   Height as of this encounter: 6' (1.829 m).   Weight as of this encounter: 60.5 kg.    Procedures:  None  Consultations:  None  Discharge Exam: BP (!) 132/93 (BP Location: Right Arm)  Pulse 83   Temp 98.5 F (36.9 C) (Oral)   Resp 20   Ht 6' (1.829 m)   Wt 60.5 kg   SpO2 100%   BMI 18.09 kg/m   General: NAD, thin male Cardiovascular: S1, S2 present Respiratory:  CTA B  Discharge Instructions You were cared for by a hospitalist during your hospital stay. If you have any questions about your discharge medications or the care you received while you were in the hospital after you are discharged, you can call the unit and asked to speak with the hospitalist on call if the hospitalist that took care of you is not available. Once you are discharged, your primary care physician will handle any further medical issues. Please note that NO REFILLS for any discharge medications will be authorized once you are discharged, as it is imperative that you return to your primary care physician (or establish a relationship with a primary care physician if you do not have one) for your aftercare needs so that they can reassess your need for medications and monitor your lab values.  Discharge Instructions    Diet - low sodium heart healthy   Complete by: As directed    Increase activity slowly   Complete by: As directed      Allergies as of 11/22/2019   No Known Allergies     Medication List    STOP taking these medications   insulin aspart protamine- aspart (70-30) 100 UNIT/ML injection Commonly known as: NOVOLOG MIX 70/30     TAKE these medications   glucose blood test strip Commonly known as: Accu-Chek Aviva 1 each by Other route 4 (four) times daily. And lancets 250.03   Lantus SoloStar 100 UNIT/ML Solostar Pen Generic drug: insulin glargine Inject 30 Units into the skin daily.   NovoLOG FlexPen 100 UNIT/ML FlexPen Generic drug: insulin aspart Inject 10 Units into the skin 3 (three) times daily with meals.   pantoprazole 40 MG tablet Commonly known as: Protonix Take 1 tablet (40 mg total) by mouth daily.   Pen Needles 3/16" 31G X 5 MM Misc 100 Devices by Does not apply route in the morning, at noon, and at bedtime.      No Known Allergies Follow-up Information    [facility] Follow up on 12/01/2019.   Why: @ 10:50 am  (please arrive 15 mins prior to appt) - to re-establish primary medical provider Contact information: [address] [facility] [address] [place] [phone]           The results of significant diagnostics from this hospitalization (including imaging, microbiology, ancillary and laboratory) are listed below for reference.    Significant Diagnostic Studies: CT ANGIO CHEST PE W OR WO CONTRAST  Result Date: 11/19/2019 CLINICAL DATA:  Shortness of breath, diffuse abdominal pain, nausea and vomiting EXAM: CT ANGIOGRAPHY CHEST CT ABDOMEN AND PELVIS WITH CONTRAST TECHNIQUE: Multidetector CT imaging of the chest was performed using the standard protocol during bolus administration of intravenous contrast. Multiplanar CT image reconstructions and MIPs were obtained to evaluate the vascular anatomy. Multidetector CT imaging of the abdomen and pelvis was performed using the standard protocol during bolus administration of intravenous contrast. CONTRAST:  100mL OMNIPAQUE IOHEXOL 350 MG/ML SOLN COMPARISON:  None. FINDINGS: CTA CHEST FINDINGS Cardiovascular: Satisfactory opacification of the pulmonary arteries to the segmental level. No evidence of pulmonary embolism. Normal heart size. No pericardial effusion. Thoracic aorta is normal in course and caliber. Mediastinum/Nodes: No axillary, mediastinal, or  hilar lymphadenopathy. Diffuse circumferential thickening and mucosal edema throughout the esophagus. Unremarkable thyroid and trachea. Lungs/Pleura: There are a few scattered areas of subtle ground-glass opacity within the bilateral lung bases (series 2, images 107 and 111). Lungs are otherwise clear. No lobar consolidation. No pleural effusion or pneumothorax. Musculoskeletal: No chest wall abnormality. No acute or significant osseous findings. Review of the MIP images confirms the above findings. CT ABDOMEN and PELVIS FINDINGS Hepatobiliary: No focal liver abnormality is seen. No gallstones,  gallbladder wall thickening, or biliary dilatation. Pancreas: Unremarkable. No pancreatic ductal dilatation or surrounding inflammatory changes. Spleen: Normal in size without focal abnormality. 1.6 cm splenule noted at the splenic hilum. Adrenals/Urinary Tract: Adrenal glands are unremarkable. Kidneys are normal, without renal calculi, focal lesion, or hydronephrosis. Bladder is unremarkable. Stomach/Bowel: Stomach is within normal limits. Appendix not definitively identified. No evidence of bowel wall thickening, distention, or inflammatory changes. Vascular/Lymphatic: No significant vascular findings are present. No enlarged abdominal or pelvic lymph nodes. Reproductive: Prostate is unremarkable. Other: No abdominal wall hernia or abnormality. No abdominopelvic ascites. Musculoskeletal: No acute or significant osseous findings. Transitional lumbosacral anatomy with partial lumbarization of the S1 segment. Review of the MIP images confirms the above findings. IMPRESSION: 1. Negative for pulmonary embolism. 2. There are a few small scattered areas of subtle ground-glass opacity within the bilateral lung bases, which may represent an infectious or inflammatory process. 3. Diffuse circumferential thickening and mucosal edema throughout the esophagus, suggestive of esophagitis. 4. Otherwise negative CT examination of the chest, abdomen, and pelvis. Electronically Signed   By: Duanne Guess D.O.   On: 11/19/2019 13:14   CT ABDOMEN PELVIS W CONTRAST  Result Date: 11/19/2019 CLINICAL DATA:  Shortness of breath, diffuse abdominal pain, nausea and vomiting EXAM: CT ANGIOGRAPHY CHEST CT ABDOMEN AND PELVIS WITH CONTRAST TECHNIQUE: Multidetector CT imaging of the chest was performed using the standard protocol during bolus administration of intravenous contrast. Multiplanar CT image reconstructions and MIPs were obtained to evaluate the vascular anatomy. Multidetector CT imaging of the abdomen and pelvis was performed  using the standard protocol during bolus administration of intravenous contrast. CONTRAST:  OMNIPAQUE IOHEXOL 350 MG/ML SOLN COMPARISON:  None. FINDINGS: CTA CHEST FINDINGS Cardiovascular: Satisfactory opacification of the pulmonary arteries to the segmental level. No evidence of pulmonary embolism. Normal heart size. No pericardial effusion. Thoracic aorta is normal in course and caliber. Mediastinum/Nodes: No axillary, mediastinal, or hilar lymphadenopathy. Diffuse circumferential thickening and mucosal edema throughout the esophagus. Unremarkable thyroid and trachea. Lungs/Pleura: There are a few scattered areas of subtle ground-glass opacity within the bilateral lung bases (series 2, images 107 and 111). Lungs are otherwise clear. No lobar consolidation. No pleural effusion or pneumothorax. Musculoskeletal: No chest wall abnormality. No acute or significant osseous findings. Review of the MIP images confirms the above findings. CT ABDOMEN and PELVIS FINDINGS Hepatobiliary: No focal liver abnormality is seen. No gallstones, gallbladder wall thickening, or biliary dilatation. Pancreas: Unremarkable. No pancreatic ductal dilatation or surrounding inflammatory changes. Spleen: Normal in size without focal abnormality. 1.6 cm splenule noted at the splenic hilum. Adrenals/Urinary Tract: Adrenal glands are unremarkable. Kidneys are normal, without renal calculi, focal lesion, or hydronephrosis. Bladder is unremarkable. Stomach/Bowel: Stomach is within normal limits. Appendix not definitively identified. No evidence of bowel wall thickening, distention, or inflammatory changes. Vascular/Lymphatic: No significant vascular findings are present. No enlarged abdominal or pelvic lymph nodes. Reproductive: Prostate is unremarkable. Other: No abdominal wall hernia or abnormality. No abdominopelvic ascites. Musculoskeletal: No acute or significant osseous  findings. Transitional lumbosacral anatomy with partial  lumbarization of the S1 segment. Review of the MIP images confirms the above findings. IMPRESSION: 1. Negative for pulmonary embolism. 2. There are a few small scattered areas of subtle ground-glass opacity within the bilateral lung bases, which may represent an infectious or inflammatory process. 3. Diffuse circumferential thickening and mucosal edema throughout the esophagus, suggestive of esophagitis. 4. Otherwise negative CT examination of the chest, abdomen, and pelvis. Electronically Signed   By: Duanne Guess D.O.   On: 11/19/2019 13:14   DG Chest Port 1 View  Result Date: 11/19/2019 CLINICAL DATA:  Fever EXAM: PORTABLE CHEST 1 VIEW COMPARISON:  06/11/2017 FINDINGS: Heart and mediastinal contours are within normal limits. No focal opacities or effusions. No acute bony abnormality. IMPRESSION: Normal study. Electronically Signed   By: Charlett Nose M.D.   On: 11/19/2019 01:25    Microbiology: Recent Results (from the past 240 hour(s))  Urine culture     Status: None   Collection Time: 11/19/19  1:00 AM   Specimen: In/Out Cath Urine  Result Value Ref Range Status   Specimen Description IN/OUT CATH URINE  Final   Special Requests   Final    NONE Performed at [facility], [address], [place], [place] [address]    Culture NO GROWTH  Final   Report Status 11/20/2019 FINAL  Final  SARS CORONAVIRUS 2 (TAT 6-24 HRS) Nasopharyngeal Nasopharyngeal Swab     Status: None   Collection Time: 11/19/19  1:07 AM   Specimen: Nasopharyngeal Swab  Result Value Ref Range Status   SARS Coronavirus 2 NEGATIVE NEGATIVE Final    Comment: (NOTE) SARS-CoV-2 target nucleic acids are NOT DETECTED. The SARS-CoV-2 RNA is generally detectable in upper and lower respiratory specimens during the acute phase of infection. Negative results do not preclude SARS-CoV-2 infection, do not rule out co-infections with other pathogens, and should not be used as the sole basis for treatment or  other patient management decisions. Negative results must be combined with clinical observations, patient history, and epidemiological information. The expected result is Negative. Fact Sheet for Patients: HairSlick.no Fact Sheet for Healthcare Providers: quierodirigir.com This test is not yet approved or cleared by the [place] FDA and  has been authorized for detection and/or diagnosis of SARS-CoV-2 by FDA under an Emergency Use Authorization (EUA). This EUA will remain  in effect (meaning this test can be used) for the duration of the COVID-19 declaration under Section 56 4(b)(1) of the Act, 21 U.S.C. section 360bbb-3(b)(1), unless the authorization is terminated or revoked sooner. Performed at [facility], [address] [place], [place] [address]   Blood Culture (routine x 2)     Status: None (Preliminary result)   Collection Time: 11/19/19  1:25 AM   Specimen: BLOOD  Result Value Ref Range Status   Specimen Description   Final    BLOOD RIGHT ANTECUBITAL Performed at [facility], [address], [place], [place] [address]    Special Requests   Final    BOTTLES DRAWN AEROBIC AND ANAEROBIC Blood Culture adequate volume Performed at [facility], [address] [place], [place] [address]    Culture NO GROWTH 3 DAYS  Final   Report Status PENDING  Incomplete  Blood Culture (routine x 2)     Status: None (Preliminary result)   Collection Time: 11/19/19  1:40 AM   Specimen: BLOOD  Result Value Ref Range Status   Specimen Description  Final    BLOOD BLOOD LEFT FOREARM Performed at [facility], [address] [place], [place] [address]    Special Requests   Final    BOTTLES DRAWN AEROBIC AND ANAEROBIC Blood Culture results may not be optimal due to an inadequate volume of blood received in culture bottles Performed at [facility], [address],  [place], [place] [address]    Culture NO GROWTH 3 DAYS  Final   Report Status PENDING  Incomplete  MRSA PCR Screening     Status: Abnormal   Collection Time: 11/19/19  3:12 PM   Specimen: Nasopharyngeal  Result Value Ref Range Status   MRSA by PCR POSITIVE (A) NEGATIVE Final    Comment:        The GeneXpert MRSA Assay (FDA approved for NASAL specimens only), is one component of a comprehensive MRSA colonization surveillance program. It is not intended to diagnose MRSA infection nor to guide or monitor treatment for MRSA infections. RESULT CALLED TO, READ BACK BY AND VERIFIED WITH: GOIN, L. @ 1051 11/21/2019 PERRY, J. Performed at [facility], [address], [place], [place] [address]      Labs: Basic Metabolic Panel: Recent Labs  Lab 11/19/19 0100 11/19/19 0520 11/20/19 2248 11/21/19 0058 11/21/19 0331 11/21/19 0746 11/21/19 1326 11/21/19 1754 11/22/19 0245  NA 129*   < >   < >  --  139 137 135 137 143  K 4.4   < >   < >  --  3.1* 3.6 3.1* 2.8* 2.9*  CL 89*   < >   < >  --  109 106 104 105 109  CO2 21*   < >   < >  --  21* 19* 21* 24 26  GLUCOSE 509*   < >   < >  --  122* 156* 223* 146* 97  BUN 24*   < >   < >  --  8 7 6 6  5*  CREATININE 1.39*   < >   < >  --  0.66 0.63 0.68 0.57* 0.52*  CALCIUM 9.2   < >   < >  --  8.4* 8.2* 8.4* 8.1* 8.3*  MG 1.9  --   --  1.8  --   --   --   --   --   PHOS 2.6  --   --   --   --   --   --   --   --    < > = values in this interval not displayed.   Liver Function Tests: Recent Labs  Lab 11/19/19 0100  AST 17  ALT 18  ALKPHOS 78  BILITOT 1.4*  PROT 8.1  ALBUMIN 4.1   Recent Labs  Lab 11/19/19 0100  LIPASE 12   No results for input(s): AMMONIA in the last 168 hours. CBC: Recent Labs  Lab 11/19/19 0100 11/20/19 0023 11/21/19 0439 11/22/19 0245  WBC 16.6* 11.2* 7.7 7.4  NEUTROABS 13.4* 9.1* 5.4 4.9  HGB 15.4 11.7* 10.5* 11.0*  HCT 43.8 34.1* 31.6* 32.4*  MCV 81.6 85.5 87.5 84.8  PLT 286 233  255 281   Cardiac Enzymes: No results for input(s): CKTOTAL, CKMB, CKMBINDEX, TROPONINI in the last 168 hours. BNP: BNP (last 3 results) No results for input(s): BNP in the last 8760 hours.  ProBNP (last 3 results) No results for input(s): PROBNP in the last 8760 hours.  CBG: Recent Labs  Lab 11/21/19 2306 11/22/19 0331 11/22/19 0747 11/22/19 1224 11/22/19 1601  GLUCAP 138* 95 156* 213* 93       Signed:  Briant Cedar, MD [facility] 11/22/2019, 4:53 PM

## 2019-11-23 MED FILL — !LANTUS SOLOSTAR 100UNITS/M: 100 | 30 days supply | Qty: 9 | Fill #0

## 2019-11-23 MED FILL — NOVOLOG FLEXPEN SYRINGE: 100 | 30 days supply | Qty: 9 | Fill #0

## 2019-11-23 MED FILL — TRUEPLUS 5-BEVEL PEN NEEDLE: 31G X 5 MM | 30 days supply | Qty: 100 | Fill #0

## 2019-11-23 MED FILL — CEFDINIR 300 MG CAPSULE: 300 | 1 days supply | Qty: 2 | Fill #0

## 2019-11-23 MED FILL — PANTOPRAZOLE SOD DR 40 MG T: 40 | 30 days supply | Qty: 30 | Fill #0

## 2019-11-24 LAB — CULTURE, BLOOD (ROUTINE X 2)
Culture: NO GROWTH
Culture: NO GROWTH
Special Requests: ADEQUATE

## 2019-11-30 NOTE — Progress Notes (Signed)
Patient ID: Joseph Barr, male   DOB: Mar 15, 1996, 24 y.o.   MRN: [id number]    Joseph Barr, is a 24 y.o. male  FYB:[id number]  NID:[id number]  DOB - 12-May-1996  Subjective:  Chief Complaint and HPI: Joseph Barr is a 24 y.o. male here today to establish care and for a follow up visit After hospitalization 3/20-3/23/2021 for DKA and sepsis.  Diagnosed with DM1 at age 48.  Feeling much better since hospitalization.  Blood sugars running 58-300 but mostly around 150 since resuming meds.  He has never been placed on ACE or cholesterol meds.  No fever.  No N/V/D  From discharge summary: History of present illness:  Joseph Barr a 23 y.o.malewith medical history significant fortype 1 diabetes mellitus, diagnosed at age 50, now presenting to the emergency department with 4 days of abdominal discomfort, nausea, vomiting, loose stools. Upon arrival to the ED, patient is found to be febrile to 38C,saturating mid 90s on room air, tachycardic to the 140s, and with stable blood pressure. EKG features sinus tachycardia with rate 142 and PVC. Chest x-ray is a normal study. Chemistry panel serum glucose 509, bicarbonate 21, anion gap 19, and creatinine 1.39, up from 0.98 in 2018. CBC with leukocytosis to 16,600. Lactic acid is 2.3. Beta hydroxybutyrate is elevated to 5.17. Urinalysis notable for ketones. Blood and urine cultures were collected, patient was started on insulin infusion, cefepime, vancomycin, and Flagyl. COVID-19 PCR test is negative. Patient admitted for further management.    Today, patient denies any new complaints.  Denies any worsening abdominal pain, nausea/vomiting, fever/chills, chest pain, shortness of breath, diarrhea.  Patient was able to tolerate his full liquid diet as well as moderate carb diet without any discomfort.  Patient instructed to follow-up with [facility] to reestablish care with a PCP.    Hospital Course:  Principal Problem:  DKA (diabetic ketoacidoses) (HCC) Active Problems:   SIRS (systemic inflammatory response syndrome) (HCC)   Renal insufficiency   DKA/type 1 diabetes mellitus A1c 11.6 DKA resolved On presentation, serum glucose 509, elevated anion gap, low bicarb, urine ketones positive Discussed with diabetes coordinator, recommend on discharge 30 units of Lantus, 10 units of NovoLog 3 times daily via insulin pen Follow-up with PCP  Sepsis Unknown etiology, ??  Viral gastroenteritis  Febrile, leukocytosis, tachycardic, lactic acidosis on admission Currently afebrile, withresolvedleukocytosis UA unremarkable for infection, UC no growth BC x2 NGTD Procalcitonin elevated at 2.81-->1.60-->0.77 CT A/P/chest negative for PE, noted groundglass opacity within the bilateral lung bases, which may represent infectious or inflammatory process, esophagitis Status post IV cefepime, Flagyl, vancomycin x4 days, will discharge on 1 more day of cefdinir to complete 5 days of antibiotics, as no source source of infection was identified Follow-up with PCP  Esophagitis Discharge on daily Protonix  AKI Resolved  Marijuana abuse Advised to quit ??Cyclical vomiting   ED/Hospital notes reviewed and summarized above  ROS:   Constitutional:  No f/c, No night sweats, No unexplained weight loss. EENT:  No vision changes, No blurry vision, No hearing changes. No mouth, throat, or ear problems.  Respiratory: No cough, No SOB Cardiac: No CP, no palpitations GI:  No abd pain, No N/V/D. GU: No Urinary s/sx Musculoskeletal: No joint pain Neuro: No headache, no dizziness, no motor weakness.  Skin: No rash Endocrine:  No polydipsia. No polyuria.  Psych: Denies SI/HI  No problems updated.  ALLERGIES: No Known Allergies  PAST MEDICAL HISTORY: Past Medical History:  Diagnosis Date  .  Diabetes mellitus     MEDICATIONS AT HOME: Prior to Admission medications   Medication Sig Start Date End Date  Taking? Authorizing Provider  insulin aspart (NOVOLOG FLEXPEN) 100 UNIT/ML FlexPen Inject 10 Units into the skin 3 (three) times daily with meals. 12/01/19  Yes Melisssa Donner M, PA-C  insulin glargine (LANTUS SOLOSTAR) 100 UNIT/ML Solostar Pen Inject 30 Units into the skin daily. 12/01/19  Yes Georgian Co M, PA-C  Insulin Pen Needle (PEN NEEDLES 3/16") 31G X 5 MM MISC 100 Devices by Does not apply route in the morning, at noon, and at bedtime. 12/01/19  Yes Georgian Co M, PA-C  pantoprazole (PROTONIX) 40 MG tablet Take 1 tablet (40 mg total) by mouth daily. 11/22/19 12/22/19 Yes Briant Cedar, MD  atorvastatin (LIPITOR) 20 MG tablet Take 1 tablet (20 mg total) by mouth daily. 12/01/19   Anders Simmonds, PA-C  glucose blood (ACCU-CHEK AVIVA) test strip 1 each by Other route 4 (four) times daily. And lancets 250.03 Patient not taking: Reported on 06/11/2017 06/26/14   Romero Belling, MD  lisinopril (ZESTRIL) 5 MG tablet Take 0.5 tablets (2.5 mg total) by mouth daily. 12/01/19   Anders Simmonds, PA-C     Objective:  EXAM:   Vitals:   12/01/19 1039  BP: 114/66  Pulse: (!) 107  SpO2: 100%  Weight: 132 lb 12.8 oz (60.2 kg)  Height: 6' (1.829 m)    General appearance : A&OX3. NAD. Non-toxic-appearing; very thin HEENT: Atraumatic and Normocephalic.  PERRLA. EOM intact.   Chest/Lungs:  Breathing-non-labored, Good air entry bilaterally, breath sounds normal without rales, rhonchi, or wheezing  CVS: S1 S2 regular, no murmurs, gallops, rubs  Extremities: Bilateral Lower Ext shows no edema, both legs are warm to touch with = pulse throughout Neurology:  CN II-XII grossly intact, Non focal.   Psych:  TP linear. J/I WNL. Normal speech. Appropriate eye contact and affect.  Skin:  No Rash  Data Review Lab Results  Component Value Date   HGBA1C 11.6 (H) 11/19/2019   HGBA1C 12.3 (H) 06/11/2017   HGBA1C 11.3 08/01/2016     Assessment & Plan   1. Type 1 diabetes mellitus with  ketoacidosis without coma (HCC) Uncontrolled but was using subpar levels of meds - Glucose (CBG) - insulin aspart (NOVOLOG FLEXPEN) 100 UNIT/ML FlexPen; Inject 10 Units into the skin 3 (three) times daily with meals.  Dispense: 15 mL; Refill: 6 - insulin glargine (LANTUS SOLOSTAR) 100 UNIT/ML Solostar Pen; Inject 30 Units into the skin daily.  Dispense: 15 mL; Refill: 6 - Insulin Pen Needle (PEN NEEDLES 3/16") 31G X 5 MM MISC; 100 Devices by Does not apply route in the morning, at noon, and at bedtime.  Dispense: 100 each; Refill: 1 - Comprehensive metabolic panel - CBC with Differential/Platelet - lisinopril (ZESTRIL) 5 MG tablet; Take 0.5 tablets (2.5 mg total) by mouth daily.  Dispense: 45 tablet; Refill: 3  2. Hospital discharge follow-up  3. Lipids abnormal - Lipid panel - atorvastatin (LIPITOR) 20 MG tablet; Take 1 tablet (20 mg total) by mouth daily.  Dispense: 90 tablet; Refill: 3  4. Vasovagal episode in lab with blood draw Patient was placed into wheelchair then trendelenburg.  Glucose checked and was 63.  Repeat BP after juice and 108/78 and pulse=84.  Pulse ox 96%.  Continued to observe in office after blood draw and episode for 30 mins.  Patient stable and feeling much better.  There was no syncope or collapse.  I was  present at episode and throughout     Patient have been counseled extensively about nutrition and exercise  Return in about 2 months (around 01/31/2020) for assign PCP for diabetes type 1 managemtn.  The patient was given clear instructions to go to ER or return to medical center if symptoms don't improve, worsen or new problems develop. The patient verbalized understanding. The patient was told to call to get lab results if they haven't heard anything in the next week.     Freeman Caldron, PA-C [facility] [place], [place]   12/01/2019, 11:46 AM

## 2019-12-01 ENCOUNTER — Other Ambulatory Visit: Payer: Self-pay

## 2019-12-01 ENCOUNTER — Ambulatory Visit: Payer: Self-pay | Attending: Family Medicine | Admitting: Physician Assistant

## 2019-12-01 VITALS — BP 114/66 | HR 107 | Ht 72.0 in | Wt 132.8 lb

## 2019-12-01 DIAGNOSIS — E7889 Other lipoprotein metabolism disorders: Secondary | ICD-10-CM

## 2019-12-01 DIAGNOSIS — R55 Syncope and collapse: Secondary | ICD-10-CM

## 2019-12-01 DIAGNOSIS — Z09 Encounter for follow-up examination after completed treatment for conditions other than malignant neoplasm: Secondary | ICD-10-CM

## 2019-12-01 DIAGNOSIS — E1010 Type 1 diabetes mellitus with ketoacidosis without coma: Secondary | ICD-10-CM

## 2019-12-01 LAB — GLUCOSE, POCT (MANUAL RESULT ENTRY): POC Glucose: 101 mg/dl — AB (ref 70–99)

## 2019-12-01 MED ORDER — LISINOPRIL 5 MG PO TABS: 5.0000 mg | ORAL_TABLET | Freq: Every day | ORAL | 3 refills | Status: DC

## 2019-12-01 MED ORDER — "PEN NEEDLES 3/16"" 31G X 5 MM MISC": 100.0000 | Freq: Three times a day (TID) | 1 refills | Status: DC

## 2019-12-01 MED ORDER — LISINOPRIL 5 MG PO TABS: 2.5000 mg | ORAL_TABLET | Freq: Every day | ORAL | 3 refills | Status: DC

## 2019-12-01 MED ORDER — ATORVASTATIN CALCIUM 20 MG PO TABS: 20.0000 mg | ORAL_TABLET | Freq: Every day | ORAL | 3 refills | Status: DC

## 2019-12-01 MED ORDER — NOVOLOG FLEXPEN 100 UNIT/ML ~~LOC~~ SOPN: 10.0000 [IU] | PEN_INJECTOR | Freq: Three times a day (TID) | SUBCUTANEOUS | 6 refills | Status: DC

## 2019-12-01 MED ORDER — LANTUS SOLOSTAR 100 UNIT/ML ~~LOC~~ SOPN: 30.0000 [IU] | PEN_INJECTOR | Freq: Every day | SUBCUTANEOUS | 6 refills | Status: DC

## 2019-12-01 MED FILL — ATORVASTATIN CALCIUM 20 MG: 20 | 30 days supply | Qty: 30 | Fill #0

## 2019-12-01 MED FILL — LISINOPRIL 5 MG TABLET: 5 | 30 days supply | Qty: 15 | Fill #0

## 2019-12-01 NOTE — Progress Notes (Signed)
States that when he eats he starts to sweat really bad.  Wants to stay on Protonix.

## 2019-12-02 LAB — COMPREHENSIVE METABOLIC PANEL
ALT: 12 IU/L (ref 0–44)
AST: 18 IU/L (ref 0–40)
Albumin/Globulin Ratio: 1.8 (ref 1.2–2.2)
Albumin: 4.6 g/dL (ref 4.1–5.2)
Alkaline Phosphatase: 52 IU/L (ref 39–117)
BUN/Creatinine Ratio: 15 (ref 9–20)
BUN: 13 mg/dL (ref 6–20)
Bilirubin Total: <0.2 mg/dL (ref 0.0–1.2)
CO2: 25 mmol/L (ref 20–29)
Calcium: 9.9 mg/dL (ref 8.7–10.2)
Chloride: 99 mmol/L (ref 96–106)
Creatinine, Ser: 0.85 mg/dL (ref 0.76–1.27)
GFR calc Af Amer: 141 mL/min/{1.73_m2} (ref >59)
GFR calc non Af Amer: 122 mL/min/{1.73_m2} (ref >59)
Globulin, Total: 2.6 g/dL (ref 1.5–4.5)
Glucose: 52 mg/dL — ABNORMAL LOW (ref 65–99)
Potassium: 4.4 mmol/L (ref 3.5–5.2)
Sodium: 137 mmol/L (ref 134–144)
Total Protein: 7.2 g/dL (ref 6.0–8.5)

## 2019-12-02 LAB — LIPID PANEL
Chol/HDL Ratio: 3.5 ratio (ref 0.0–5.0)
Cholesterol, Total: 157 mg/dL (ref 100–199)
HDL: 45 mg/dL (ref >39)
LDL Chol Calc (NIH): 100 mg/dL — ABNORMAL HIGH (ref 0–99)
Triglycerides: 58 mg/dL (ref 0–149)
VLDL Cholesterol Cal: 12 mg/dL (ref 5–40)

## 2019-12-02 LAB — CBC WITH DIFFERENTIAL/PLATELET
Basophils Absolute: 0.1 10*3/uL (ref 0.0–0.2)
Basos: 1 %
EOS (ABSOLUTE): 0.1 10*3/uL (ref 0.0–0.4)
Eos: 1 %
Hematocrit: 36.2 % — ABNORMAL LOW (ref 37.5–51.0)
Hemoglobin: 12.2 g/dL — ABNORMAL LOW (ref 13.0–17.7)
Immature Grans (Abs): 0 10*3/uL (ref 0.0–0.1)
Immature Granulocytes: 0 %
Lymphocytes Absolute: 2.1 10*3/uL (ref 0.7–3.1)
Lymphs: 38 %
MCH: 29.1 pg (ref 26.6–33.0)
MCHC: 33.7 g/dL (ref 31.5–35.7)
MCV: 86 fL (ref 79–97)
Monocytes Absolute: 0.7 10*3/uL (ref 0.1–0.9)
Monocytes: 12 %
Neutrophils Absolute: 2.7 10*3/uL (ref 1.4–7.0)
Neutrophils: 48 %
Platelets: 483 10*3/uL — ABNORMAL HIGH (ref 150–450)
RBC: 4.19 x10E6/uL (ref 4.14–5.80)
RDW: 13.6 % (ref 11.6–15.4)
WBC: 5.6 10*3/uL (ref 3.4–10.8)

## 2019-12-05 ENCOUNTER — Encounter: Payer: Self-pay | Admitting: *Deleted

## 2019-12-19 ENCOUNTER — Telehealth: Payer: Self-pay | Admitting: Internal Medicine

## 2019-12-19 MED FILL — TRUEPLUS 5-BEVEL PEN NEEDLE: 31G X 5 MM | 30 days supply | Qty: 100 | Fill #0

## 2019-12-19 MED FILL — !LANTUS SOLOSTAR 100UNITS/M: 100 | 20 days supply | Qty: 6 | Fill #1

## 2019-12-19 MED FILL — !NOVOLOG FLEXPEN SYRINGE 1: 100/ML | 20 days supply | Qty: 6 | Fill #1

## 2019-12-19 NOTE — Telephone Encounter (Signed)
Patient called into the office and requested for listed medication to be refilled.  pantoprazole (PROTONIX) 40 MG tablet [[id number]

## 2019-12-19 NOTE — Telephone Encounter (Signed)
Pt was seen pt PA on 4/1. Will forward Marylene Land for refill

## 2019-12-20 ENCOUNTER — Other Ambulatory Visit: Payer: Self-pay | Admitting: Physician Assistant

## 2019-12-20 MED ORDER — PANTOPRAZOLE SODIUM 40 MG PO TBEC: 40.0000 mg | DELAYED_RELEASE_TABLET | Freq: Every day | ORAL | 2 refills | Status: DC

## 2019-12-20 MED FILL — PANTOPRAZOLE SOD DR 40 MG T: 40 | 30 days supply | Qty: 30 | Fill #0

## 2019-12-26 MED FILL — LISINOPRIL 5 MG TABLET: 5 | 30 days supply | Qty: 15 | Fill #1

## 2019-12-26 MED FILL — ATORVASTATIN CALCIUM 20 MG: 20 | 30 days supply | Qty: 30 | Fill #1

## 2020-01-13 ENCOUNTER — Ambulatory Visit: Payer: Medicaid Other | Admitting: Internal Medicine

## 2020-01-26 ENCOUNTER — Telehealth: Payer: Self-pay

## 2020-01-26 DIAGNOSIS — E1010 Type 1 diabetes mellitus with ketoacidosis without coma: Secondary | ICD-10-CM

## 2020-01-26 MED ORDER — LANTUS SOLOSTAR 100 UNIT/ML ~~LOC~~ SOPN: 30.0000 [IU] | PEN_INJECTOR | Freq: Every day | SUBCUTANEOUS | 0 refills | Status: DC

## 2020-01-26 MED FILL — ATORVASTATIN CALCIUM 20 MG: 20 | 30 days supply | Qty: 30 | Fill #2

## 2020-01-26 MED FILL — TRUEPLUS 5-BEVEL PEN NEEDLE: 31G X 5 MM | 30 days supply | Qty: 100 | Fill #1

## 2020-01-26 NOTE — Telephone Encounter (Signed)
Rx sent 

## 2020-01-26 NOTE — Telephone Encounter (Signed)
1) Medication(s) Requested (by name): LANTUS SOLOSTAR  2) Pharmacy of Choice: [facility]  3) Special Requests: 0 refills   Approved medications will be sent to the pharmacy, we will reach out if there is an issue.  Requests made after 3pm may not be addressed until the following business day!  If a patient is unsure of the name of the medication(s) please note and ask patient to call back when they are able to provide all info, do not send to responsible party until all information is available!

## 2020-01-27 MED FILL — ?BASAGLAR 100 UNITS/ML KWPE: 100 | 30 days supply | Qty: 9 | Fill #0

## 2020-02-15 MED FILL — $novoLOG FLEXPEN SYRINGE: 100 | 90 days supply | Qty: 27 | Fill #0

## 2020-02-16 MED FILL — LISINOPRIL 5 MG TABLET: 5 | 30 days supply | Qty: 15 | Fill #2

## 2020-02-16 MED FILL — PANTOPRAZOLE SOD DR 40 MG T: 40 | 30 days supply | Qty: 30 | Fill #1

## 2020-02-28 ENCOUNTER — Other Ambulatory Visit: Payer: Self-pay

## 2020-02-28 ENCOUNTER — Ambulatory Visit: Payer: Self-pay | Attending: Nurse Practitioner | Admitting: Nurse Practitioner

## 2020-03-01 ENCOUNTER — Other Ambulatory Visit: Payer: Self-pay | Admitting: General Practice

## 2020-03-01 DIAGNOSIS — E1010 Type 1 diabetes mellitus with ketoacidosis without coma: Secondary | ICD-10-CM

## 2020-03-01 NOTE — Telephone Encounter (Signed)
Requested medication (s) are due for refill today: yes  Requested medication (s) are on the active medication list: yes  Last refill:  01/26/2020  Future visit scheduled: yes  Notes to clinic:  Pt says that he was suppose to have a video call but no one ever reached out to him for his apt.      Requested Prescriptions  Pending Prescriptions Disp Refills   insulin glargine (LANTUS SOLOSTAR) 100 UNIT/ML Solostar Pen 9 mL 0    Sig: Inject 30 Units into the skin daily.      Endocrinology:  Diabetes - Insulins Failed - 03/01/2020 12:08 PM      Failed - HBA1C is between 0 and 7.9 and within 180 days    Hgb A1c MFr Bld  Date Value Ref Range Status  11/19/2019 11.6 (H) 4.8 - 5.6 % Final    Comment:    (NOTE) Pre diabetes:          5.7%-6.4% Diabetes:              >6.4% Glycemic control for   <7.0% adults with diabetes           Passed - Valid encounter within last 6 months    Recent Outpatient Visits           3 months ago Type 1 diabetes mellitus with ketoacidosis without coma [facility])   [place] [facility] [place], [place], [place]   3 years ago Type 1 diabetes mellitus with ketoacidosis without coma [facility])   [facility] [place], [place] R, FNP   5 years ago Type 1 diabetes mellitus with ketoacidosis and without coma    [facility] [place], Odette Horns, MD   5 years ago Type 1 diabetes mellitus with hyperglycemia   [facility] Doris Cheadle, MD

## 2020-03-01 NOTE — Telephone Encounter (Signed)
Pt is requesting a refill for his insulin glargine (LANTUS SOLOSTAR) 100 UNIT/ML Solostar Pen. Pt says that he was suppose to have a video call but no one ever reached out to him for his apt.    Pharmacy: [facility] [place], [place] [address]  [address], [place] [address]  Phone:  [phone] Fax:  [phone]    Please assist

## 2020-03-08 NOTE — Telephone Encounter (Signed)
Patient was set with appointment for next month please refill if appropriate.

## 2020-03-12 MED ORDER — LANTUS SOLOSTAR 100 UNIT/ML ~~LOC~~ SOPN: 30.0000 [IU] | PEN_INJECTOR | Freq: Every day | SUBCUTANEOUS | 0 refills | Status: DC

## 2020-03-12 MED FILL — ?BASAGLAR 100 UNITS/ML KWPE: 100 | 30 days supply | Qty: 9 | Fill #0

## 2020-03-12 NOTE — Telephone Encounter (Signed)
Patient calling back about this request. Patient will be out in 2 days.

## 2020-03-13 ENCOUNTER — Other Ambulatory Visit: Payer: Self-pay | Admitting: Nurse Practitioner

## 2020-03-13 DIAGNOSIS — E1010 Type 1 diabetes mellitus with ketoacidosis without coma: Secondary | ICD-10-CM

## 2020-04-09 ENCOUNTER — Encounter: Payer: Self-pay | Admitting: Nurse Practitioner

## 2020-04-09 ENCOUNTER — Ambulatory Visit: Payer: Self-pay | Attending: Nurse Practitioner | Admitting: Nurse Practitioner

## 2020-04-09 ENCOUNTER — Other Ambulatory Visit: Payer: Self-pay

## 2020-04-09 DIAGNOSIS — E7889 Other lipoprotein metabolism disorders: Secondary | ICD-10-CM

## 2020-04-09 DIAGNOSIS — E1010 Type 1 diabetes mellitus with ketoacidosis without coma: Secondary | ICD-10-CM

## 2020-04-09 MED ORDER — ATORVASTATIN CALCIUM 20 MG PO TABS: 20.0000 mg | ORAL_TABLET | Freq: Every day | ORAL | 3 refills | Status: DC

## 2020-04-09 MED ORDER — PANTOPRAZOLE SODIUM 40 MG PO TBEC: 40.0000 mg | DELAYED_RELEASE_TABLET | Freq: Every day | ORAL | 2 refills | Status: DC

## 2020-04-09 MED ORDER — INSULIN PEN NEEDLE 31G X 5 MM MISC
100.0000 | Freq: Three times a day (TID) | 1 refills | Status: DC
Start: 2020-04-09 — End: 2021-07-05
  Filled 2021-01-08: qty 100, 33d supply, fill #0

## 2020-04-09 MED ORDER — LANTUS SOLOSTAR 100 UNIT/ML ~~LOC~~ SOPN: 30.0000 [IU] | PEN_INJECTOR | Freq: Every day | SUBCUTANEOUS | 2 refills | Status: DC

## 2020-04-09 MED ORDER — LISINOPRIL 5 MG PO TABS: 2.5000 mg | ORAL_TABLET | Freq: Every day | ORAL | 3 refills | Status: DC

## 2020-04-09 NOTE — Progress Notes (Signed)
Virtual Visit via Telephone Note Due to national recommendations of social distancing due to COVID 19, telehealth visit is felt to be most appropriate for this patient at this time.  I discussed the limitations, risks, security and privacy concerns of performing an evaluation and management service by telephone and the availability of in person appointments. I also discussed with the patient that there may be a patient responsible charge related to this service. The patient expressed understanding and agreed to proceed.    I connected with Joseph Barr on 04/09/20  at   3:30 PM EDT  EDT by telephone and verified that I am speaking with the correct person using two identifiers.   Consent I discussed the limitations, risks, security and privacy concerns of performing an evaluation and management service by telephone and the availability of in person appointments. I also discussed with the patient that there may be a patient responsible charge related to this service. The patient expressed understanding and agreed to proceed.   Location of Patient: Private Residence    Location of Provider: [facility]    Persons participating in Telemedicine visit: Bertram Denver FNP-BC [facility] CMA Joseph Barr    History of Present Illness: Telemedicine visit for: Establish Care PMH: DM1, HPL   DM TYPE 1 Not well controlled. Diagnosed with diabetes type 1 at the age of 60. Currently Taking novolog 10 units TID and lantus 30 units daily.  Checking blood glucose levels at least 4 times per day. Will start on renal dose ACE and STATIN. Denies symptoms of neuropathy at this time. LDL not at goal of <70.   Lab Results  Component Value Date   HGBA1C 11.6 (H) 11/19/2019   Lab Results  Component Value Date   LDLCALC 100 (H) 12/01/2019     Past Medical History:  Diagnosis Date  . Diabetes mellitus     No past surgical history on file.  Family History  Problem  Relation Age of Onset  . Cancer Maternal Grandfather   . Hypertension Mother   . Diabetes Neg Hx     Social History   Socioeconomic History  . Marital status: Single    Spouse name: Not on file  . Number of children: Not on file  . Years of education: Not on file  . Highest education level: Not on file  Occupational History  . Not on file  Tobacco Use  . Smoking status: Former Smoker    Types: E-cigarettes  . Smokeless tobacco: Former Clinical biochemist  . Vaping Use: Former  Substance and Sexual Activity  . Alcohol use: No  . Drug use: Yes    Types: Marijuana  . Sexual activity: Not Currently  Other Topics Concern  . Not on file  Social History Narrative  . Not on file   Social Determinants of Health   Financial Resource Strain:   . Difficulty of Paying Living Expenses:   Food Insecurity:   . Worried About Programme researcher, broadcasting/film/video in the Last Year:   . Barista in the Last Year:   Transportation Needs:   . Freight forwarder (Medical):   Marland Kitchen Lack of Transportation (Non-Medical):   Physical Activity:   . Days of Exercise per Week:   . Minutes of Exercise per Session:   Stress:   . Feeling of Stress :   Social Connections:   . Frequency of Communication with Friends and Family:   . Frequency  of Social Gatherings with Friends and Family:   . Attends Religious Services:   . Active Member of Clubs or Organizations:   . Attends Banker Meetings:   Marland Kitchen Marital Status:      Observations/Objective: Awake, alert and oriented x 3   Review of Systems  Constitutional: Negative for fever, malaise/fatigue and weight loss.  HENT: Negative.  Negative for nosebleeds.   Eyes: Negative.  Negative for blurred vision, double vision and photophobia.  Respiratory: Negative.  Negative for cough and shortness of breath.   Cardiovascular: Negative.  Negative for chest pain, palpitations and leg swelling.  Gastrointestinal: Negative.  Negative for heartburn, nausea  and vomiting.  Musculoskeletal: Negative.  Negative for myalgias.  Neurological: Negative.  Negative for dizziness, focal weakness, seizures and headaches.  Psychiatric/Behavioral: Negative.  Negative for suicidal ideas.    Assessment and Plan: Kayceon was seen today for establish care.  Diagnoses and all orders for this visit:  Type 1 diabetes mellitus with ketoacidosis without coma (HCC) -     insulin glargine (LANTUS SOLOSTAR) 100 UNIT/ML Solostar Pen; Inject 30 Units into the skin daily. -     lisinopril (ZESTRIL) 5 MG tablet; Take 0.5 tablets (2.5 mg total) by mouth daily. -     Ambulatory referral to Endocrinology -     Insulin Pen Needle (PEN NEEDLES 3/16") 31G X 5 MM MISC; 100 Devices by Does not apply route in the morning, at noon, and at bedtime.  Lipids abnormal -     atorvastatin (LIPITOR) 20 MG tablet; Take 1 tablet (20 mg total) by mouth daily.  Other orders -     pantoprazole (PROTONIX) 40 MG tablet; Take 1 tablet (40 mg total) by mouth daily.     Follow Up Instructions No follow-ups on file.     I discussed the assessment and treatment plan with the patient. The patient was provided an opportunity to ask questions and all were answered. The patient agreed with the plan and demonstrated an understanding of the instructions.   The patient was advised to call back or seek an in-person evaluation if the symptoms worsen or if the condition fails to improve as anticipated.  I provided 17 minutes of non-face-to-face time during this encounter including median intraservice time, reviewing previous notes, labs, imaging, medications and explaining diagnosis and management.  Claiborne Rigg, FNP-BC

## 2020-04-10 MED FILL — PANTOPRAZOLE SOD DR 40 MG T: 40 | 30 days supply | Qty: 30 | Fill #0

## 2020-04-10 MED FILL — LISINOPRIL 5 MG TABLET: 5 | 30 days supply | Qty: 15 | Fill #0

## 2020-04-10 MED FILL — TRUEPLUS PEN NDL 31G X 1/4: 31G X 6 MM | 100 days supply | Qty: 100 | Fill #0

## 2020-04-10 MED FILL — ATORVASTATIN CALCIUM 20 MG: 20 | 30 days supply | Qty: 30 | Fill #0

## 2020-04-11 ENCOUNTER — Encounter: Payer: Self-pay | Admitting: Nurse Practitioner

## 2020-04-11 MED FILL — $LANTUS SOLOSTAR 100 UNITS/: 100 | 90 days supply | Qty: 27 | Fill #0

## 2020-04-13 ENCOUNTER — Other Ambulatory Visit: Payer: Medicaid Other

## 2020-04-16 ENCOUNTER — Other Ambulatory Visit: Payer: Medicaid Other

## 2020-04-16 ENCOUNTER — Other Ambulatory Visit: Payer: Self-pay | Admitting: Nurse Practitioner

## 2020-04-16 DIAGNOSIS — E1010 Type 1 diabetes mellitus with ketoacidosis without coma: Secondary | ICD-10-CM

## 2020-04-16 DIAGNOSIS — Z1159 Encounter for screening for other viral diseases: Secondary | ICD-10-CM

## 2020-04-16 DIAGNOSIS — N289 Disorder of kidney and ureter, unspecified: Secondary | ICD-10-CM

## 2020-05-01 ENCOUNTER — Ambulatory Visit: Payer: Medicaid Other | Admitting: Internal Medicine

## 2020-05-15 MED FILL — $LANTUS SOLOSTAR 100 UNITS/: 100 | 90 days supply | Qty: 27 | Fill #0

## 2020-05-16 ENCOUNTER — Encounter: Payer: Medicaid Other | Admitting: Nurse Practitioner

## 2020-06-05 MED FILL — $novoLOG FLEXPEN SYRINGE: 100 | 90 days supply | Qty: 27 | Fill #1

## 2020-06-05 MED FILL — $LANTUS SOLOSTAR 100 UNITS/: 100 | 90 days supply | Qty: 27 | Fill #0

## 2020-10-04 ENCOUNTER — Other Ambulatory Visit: Payer: Self-pay | Admitting: Nurse Practitioner

## 2020-10-04 DIAGNOSIS — E1010 Type 1 diabetes mellitus with ketoacidosis without coma: Secondary | ICD-10-CM

## 2020-10-04 MED ORDER — NOVOLOG FLEXPEN 100 UNIT/ML ~~LOC~~ SOPN
10.0000 [IU] | PEN_INJECTOR | Freq: Three times a day (TID) | SUBCUTANEOUS | 0 refills | Status: DC
  Filled 2020-12-03: qty 6, 20d supply, fill #0

## 2020-10-04 NOTE — Telephone Encounter (Signed)
Requested medication (s) are due for refill today: expired medication  Requested medication (s) are on the active medication list: yes  Last refill:  04/09/20-07/08/20 #30 2 refills   Future visit scheduled: no  Notes to clinic:  expired medication, do you want to renew Rx?     Requested Prescriptions  Pending Prescriptions Disp Refills   insulin glargine (LANTUS SOLOSTAR) 100 UNIT/ML Solostar Pen 30 mL 2    Sig: Inject 30 Units into the skin daily.      Endocrinology:  Diabetes - Insulins Failed - 10/04/2020  3:54 PM      Failed - HBA1C is between 0 and 7.9 and within 180 days    Hgb A1c MFr Bld  Date Value Ref Range Status  11/19/2019 11.6 (H) 4.8 - 5.6 % Final    Comment:    (NOTE) Pre diabetes:          5.7%-6.4% Diabetes:              >6.4% Glycemic control for   <7.0% adults with diabetes           Passed - Valid encounter within last 6 months    Recent Outpatient Visits           5 months ago Type 1 diabetes mellitus with ketoacidosis without coma [facility])   [place] [facility] [place], [place] W, NP   10 months ago Type 1 diabetes mellitus with ketoacidosis without coma [facility])   [place] [facility] [place], [place], [place]   4 years ago Type 1 diabetes mellitus with ketoacidosis without coma [facility])   [place] [facility] [place], [place] R, FNP   5 years ago Type 1 diabetes mellitus with ketoacidosis and without coma   [facility] [place], Odette Horns, MD   6 years ago Type 1 diabetes mellitus with hyperglycemia   [place] [facility] [place], Ayesha Rumpf, MD                 Signed Prescriptions Disp Refills   insulin aspart (NOVOLOG FLEXPEN) 100 UNIT/ML FlexPen 15 mL 0    Sig: Inject 10 Units into the skin 3 (three) times daily with meals.      Endocrinology:  Diabetes - Insulins Failed - 10/04/2020  3:54 PM      Failed - HBA1C is between 0 and 7.9 and  within 180 days    Hgb A1c MFr Bld  Date Value Ref Range Status  11/19/2019 11.6 (H) 4.8 - 5.6 % Final    Comment:    (NOTE) Pre diabetes:          5.7%-6.4% Diabetes:              >6.4% Glycemic control for   <7.0% adults with diabetes           Passed - Valid encounter within last 6 months    Recent Outpatient Visits           5 months ago Type 1 diabetes mellitus with ketoacidosis without coma [facility])    [facility] Wellness [place], [place] W, NP   10 months ago Type 1 diabetes mellitus with ketoacidosis without coma [facility])   [facility] [place], [place], [place]   4 years ago Type 1 diabetes mellitus with ketoacidosis without coma [facility])   [facility] [place], [place], [place]   5  years ago Type 1 diabetes mellitus with ketoacidosis and without coma   [place] [facility] [place], [place], [place]   6 years ago Type 1 diabetes mellitus with hyperglycemia   [facility] And Wellness Doris Cheadle, MD

## 2020-10-04 NOTE — Telephone Encounter (Signed)
Medication: insulin aspart (NOVOLOG FLEXPEN) 100 UNIT/ML FlexPen [[id number]] , insulin glargine (LANTUS SOLOSTAR) 100 UNIT/ML Solostar Pen [[id number]]  ENDED  Has the patient contacted their pharmacy? YES  (Agent: If no, request that the patient contact the pharmacy for the refill.) (Agent: If yes, when and what did the pharmacy advise?)  Preferred Pharmacy (with phone number or street name): [facility] [place], [place] - [place] [address] [place] [address] Phone: [phone] Fax: [phone] Hours: Not open 24 hours    Agent: Please be advised that RX refills may take up to 3 business days. We ask that you follow-up with your pharmacy.

## 2020-10-05 MED FILL — $novoLOG FLEXPEN SYRINGE: 100 | 30 days supply | Qty: 9 | Fill #0

## 2020-10-12 MED FILL — $LANTUS SOLOSTAR 100 UNITS/: 100 | 90 days supply | Qty: 27 | Fill #1

## 2020-10-12 MED FILL — TRUEPLUS PEN NDL 31G X 1/4: 31G X 6 MM | 30 days supply | Qty: 100 | Fill #1

## 2020-10-12 MED FILL — $novoLOG FLEXPEN SYRINGE: 100 | 30 days supply | Qty: 9 | Fill #0

## 2020-12-03 ENCOUNTER — Other Ambulatory Visit (HOSPITAL_COMMUNITY): Payer: Medicaid Other

## 2020-12-03 ENCOUNTER — Other Ambulatory Visit: Payer: Self-pay

## 2020-12-03 ENCOUNTER — Ambulatory Visit: Payer: Self-pay | Admitting: *Deleted

## 2020-12-03 ENCOUNTER — Emergency Department (HOSPITAL_COMMUNITY): Payer: Medicaid Other

## 2020-12-03 ENCOUNTER — Inpatient Hospital Stay (HOSPITAL_COMMUNITY): Payer: Medicaid Other

## 2020-12-03 ENCOUNTER — Inpatient Hospital Stay (HOSPITAL_COMMUNITY)
Admission: EM | Admit: 2020-12-03 | Discharge: 2020-12-07 | DRG: 871 | Disposition: A | Discharge status: Home / Self Care | Payer: Self-pay | Attending: Internal Medicine | Admitting: Internal Medicine

## 2020-12-03 ENCOUNTER — Encounter (HOSPITAL_COMMUNITY): Payer: Self-pay | Admitting: Emergency Medicine

## 2020-12-03 DIAGNOSIS — R6510 Systemic inflammatory response syndrome (SIRS) of non-infectious origin without acute organ dysfunction: Secondary | ICD-10-CM | POA: Diagnosis present

## 2020-12-03 DIAGNOSIS — A419 Sepsis, unspecified organism: Principal | ICD-10-CM | POA: Diagnosis present

## 2020-12-03 DIAGNOSIS — K219 Gastro-esophageal reflux disease without esophagitis: Secondary | ICD-10-CM | POA: Diagnosis present

## 2020-12-03 DIAGNOSIS — Z794 Long term (current) use of insulin: Secondary | ICD-10-CM

## 2020-12-03 DIAGNOSIS — Z289 Immunization not carried out for unspecified reason: Secondary | ICD-10-CM

## 2020-12-03 DIAGNOSIS — E785 Hyperlipidemia, unspecified: Secondary | ICD-10-CM | POA: Diagnosis present

## 2020-12-03 DIAGNOSIS — E878 Other disorders of electrolyte and fluid balance, not elsewhere classified: Secondary | ICD-10-CM | POA: Diagnosis present

## 2020-12-03 DIAGNOSIS — Z28310 Unvaccinated for covid-19: Secondary | ICD-10-CM

## 2020-12-03 DIAGNOSIS — R7989 Other specified abnormal findings of blood chemistry: Secondary | ICD-10-CM

## 2020-12-03 DIAGNOSIS — N179 Acute kidney failure, unspecified: Secondary | ICD-10-CM | POA: Diagnosis present

## 2020-12-03 DIAGNOSIS — E1010 Type 1 diabetes mellitus with ketoacidosis without coma: Secondary | ICD-10-CM | POA: Diagnosis present

## 2020-12-03 DIAGNOSIS — R112 Nausea with vomiting, unspecified: Secondary | ICD-10-CM

## 2020-12-03 DIAGNOSIS — Z79899 Other long term (current) drug therapy: Secondary | ICD-10-CM

## 2020-12-03 DIAGNOSIS — J189 Pneumonia, unspecified organism: Secondary | ICD-10-CM | POA: Diagnosis present

## 2020-12-03 DIAGNOSIS — A084 Viral intestinal infection, unspecified: Secondary | ICD-10-CM | POA: Diagnosis present

## 2020-12-03 DIAGNOSIS — E860 Dehydration: Secondary | ICD-10-CM | POA: Diagnosis present

## 2020-12-03 DIAGNOSIS — Z87891 Personal history of nicotine dependence: Secondary | ICD-10-CM

## 2020-12-03 DIAGNOSIS — Z20822 Contact with and (suspected) exposure to covid-19: Secondary | ICD-10-CM | POA: Diagnosis present

## 2020-12-03 DIAGNOSIS — E1310 Other specified diabetes mellitus with ketoacidosis without coma: Secondary | ICD-10-CM

## 2020-12-03 LAB — CBC WITH DIFFERENTIAL/PLATELET
Abs Immature Granulocytes: 0.33 10*3/uL — ABNORMAL HIGH (ref 0.00–0.07)
Basophils Absolute: 0.1 10*3/uL (ref 0.0–0.1)
Basophils Relative: 0 %
Eosinophils Absolute: 0.1 10*3/uL (ref 0.0–0.5)
Eosinophils Relative: 0 %
HCT: 46.5 % (ref 39.0–52.0)
Hemoglobin: 15.6 g/dL (ref 13.0–17.0)
Immature Granulocytes: 1 %
Lymphocytes Relative: 11 %
Lymphs Abs: 3.5 10*3/uL (ref 0.7–4.0)
MCH: 29.8 pg (ref 26.0–34.0)
MCHC: 33.5 g/dL (ref 30.0–36.0)
MCV: 88.9 fL (ref 80.0–100.0)
Monocytes Absolute: 2.3 10*3/uL — ABNORMAL HIGH (ref 0.1–1.0)
Monocytes Relative: 7 %
Neutro Abs: 26.8 10*3/uL — ABNORMAL HIGH (ref 1.7–7.7)
Neutrophils Relative %: 81 %
Platelets: 424 10*3/uL — ABNORMAL HIGH (ref 150–400)
RBC: 5.23 MIL/uL (ref 4.22–5.81)
RDW: 12.1 % (ref 11.5–15.5)
WBC: 33.1 10*3/uL — ABNORMAL HIGH (ref 4.0–10.5)
nRBC: 0 % (ref 0.0–0.2)

## 2020-12-03 LAB — PROCALCITONIN: Procalcitonin: 46.24 ng/mL

## 2020-12-03 LAB — COMPREHENSIVE METABOLIC PANEL
ALT: 25 U/L (ref 0–44)
AST: 23 U/L (ref 15–41)
Albumin: 4.7 g/dL (ref 3.5–5.0)
Alkaline Phosphatase: 122 U/L (ref 38–126)
Anion gap: 30 — ABNORMAL HIGH (ref 5–15)
BUN: 32 mg/dL — ABNORMAL HIGH (ref 6–20)
CO2: 12 mmol/L — ABNORMAL LOW (ref 22–32)
Calcium: 9.3 mg/dL (ref 8.9–10.3)
Chloride: 86 mmol/L — ABNORMAL LOW (ref 98–111)
Creatinine, Ser: 2.65 mg/dL — ABNORMAL HIGH (ref 0.61–1.24)
GFR, Estimated: 33 mL/min — ABNORMAL LOW (ref >60)
Glucose, Bld: 935 mg/dL (ref 70–99)
Potassium: 3.6 mmol/L (ref 3.5–5.1)
Sodium: 128 mmol/L — ABNORMAL LOW (ref 135–145)
Total Bilirubin: 1.8 mg/dL — ABNORMAL HIGH (ref 0.3–1.2)
Total Protein: 8.5 g/dL — ABNORMAL HIGH (ref 6.5–8.1)

## 2020-12-03 LAB — RESP PANEL BY RT-PCR (FLU A&B, COVID) ARPGX2
Influenza A by PCR: NEGATIVE
Influenza B by PCR: NEGATIVE
SARS Coronavirus 2 by RT PCR: NEGATIVE

## 2020-12-03 LAB — BLOOD GAS, VENOUS
Acid-base deficit: 17.9 mmol/L — ABNORMAL HIGH (ref 0.0–2.0)
Bicarbonate: 11.8 mmol/L — ABNORMAL LOW (ref 20.0–28.0)
O2 Saturation: 57.4 %
Patient temperature: 98.6
pCO2, Ven: 40.3 mmHg — ABNORMAL LOW (ref 44.0–60.0)
pH, Ven: 7.096 — CL (ref 7.250–7.430)
pO2, Ven: 36.4 mmHg (ref 32.0–45.0)

## 2020-12-03 LAB — BETA-HYDROXYBUTYRIC ACID: Beta-Hydroxybutyric Acid: 4.77 mmol/L — ABNORMAL HIGH (ref 0.05–0.27)

## 2020-12-03 LAB — LACTIC ACID, PLASMA
Lactic Acid, Venous: 4.2 mmol/L (ref 0.5–1.9)
Lactic Acid, Venous: 4.1 mmol/L (ref 0.5–1.9)

## 2020-12-03 LAB — CBG MONITORING, ED
Glucose-Capillary: >600 mg/dL (ref 70–99)
Glucose-Capillary: 507 mg/dL (ref 70–99)

## 2020-12-03 LAB — GLUCOSE, CAPILLARY
Glucose-Capillary: 349 mg/dL — ABNORMAL HIGH (ref 70–99)
Glucose-Capillary: 260 mg/dL — ABNORMAL HIGH (ref 70–99)

## 2020-12-03 MED ORDER — STERILE WATER FOR INJECTION IV SOLN: INTRAVENOUS | Status: DC

## 2020-12-03 MED ORDER — VANCOMYCIN HCL IN DEXTROSE 1-5 GM/200ML-% IV SOLN
1000.0000 mg | INTRAVENOUS | Status: DC
  Administered 2020-12-04: 1000 mg via INTRAVENOUS
  Filled 2020-12-03: qty 200

## 2020-12-03 MED ORDER — LANTUS SOLOSTAR 100 UNIT/ML ~~LOC~~ SOPN
30.0000 [IU] | PEN_INJECTOR | Freq: Every day | SUBCUTANEOUS | 0 refills | Status: DC
  Filled 2020-12-03: qty 9, 30d supply, fill #0

## 2020-12-03 MED ORDER — SODIUM CHLORIDE 0.9 % IV BOLUS
1000.0000 mL | Freq: Once | INTRAVENOUS | Status: AC
  Administered 2020-12-03: 1000 mL via INTRAVENOUS

## 2020-12-03 MED ORDER — CHLORHEXIDINE GLUCONATE CLOTH 2 % EX PADS
6.0000 | MEDICATED_PAD | Freq: Every day | CUTANEOUS | Status: DC
  Administered 2020-12-04 – 2020-12-07 (×4): 6 via TOPICAL

## 2020-12-03 MED ORDER — DEXTROSE 50 % IV SOLN: 0.0000 mL | INTRAVENOUS | Status: DC | PRN

## 2020-12-03 MED ORDER — METRONIDAZOLE IN NACL 5-0.79 MG/ML-% IV SOLN
500.0000 mg | Freq: Three times a day (TID) | INTRAVENOUS | Status: DC
  Administered 2020-12-03: 500 mg via INTRAVENOUS
  Filled 2020-12-03: qty 100

## 2020-12-03 MED ORDER — ACETAMINOPHEN 650 MG RE SUPP: 650.0000 mg | Freq: Four times a day (QID) | RECTAL | Status: DC | PRN

## 2020-12-03 MED ORDER — POTASSIUM CHLORIDE 10 MEQ/100ML IV SOLN
10.0000 meq | INTRAVENOUS | Status: AC
  Administered 2020-12-03: 10 meq via INTRAVENOUS
  Filled 2020-12-03: qty 100

## 2020-12-03 MED ORDER — ORAL CARE MOUTH RINSE
15.0000 mL | Freq: Two times a day (BID) | OROMUCOSAL | Status: DC
  Administered 2020-12-04 – 2020-12-06 (×5): 15 mL via OROMUCOSAL

## 2020-12-03 MED ORDER — INSULIN REGULAR(HUMAN) IN NACL 100-0.9 UT/100ML-% IV SOLN
INTRAVENOUS | Status: DC
  Administered 2020-12-03: 7.5 [IU]/h via INTRAVENOUS
  Filled 2020-12-03: qty 100

## 2020-12-03 MED ORDER — ACETAMINOPHEN 325 MG PO TABS
650.0000 mg | ORAL_TABLET | Freq: Four times a day (QID) | ORAL | Status: DC | PRN
  Administered 2020-12-04: 650 mg via ORAL
  Filled 2020-12-03: qty 2

## 2020-12-03 MED ORDER — SODIUM CHLORIDE 0.9 % IV SOLN
2.0000 g | Freq: Two times a day (BID) | INTRAVENOUS | Status: DC
  Administered 2020-12-04: 2 g via INTRAVENOUS
  Filled 2020-12-03: qty 2

## 2020-12-03 MED ORDER — PROCHLORPERAZINE EDISYLATE 10 MG/2ML IJ SOLN
5.0000 mg | Freq: Four times a day (QID) | INTRAMUSCULAR | Status: DC | PRN
  Administered 2020-12-03 – 2020-12-05 (×4): 5 mg via INTRAVENOUS
  Filled 2020-12-03 (×4): qty 2

## 2020-12-03 MED ORDER — STERILE WATER FOR INJECTION IV SOLN
INTRAVENOUS | Status: DC
  Filled 2020-12-03 (×2): qty 850

## 2020-12-03 MED ORDER — LACTATED RINGERS IV SOLN: INTRAVENOUS | Status: DC

## 2020-12-03 MED ORDER — LACTATED RINGERS IV BOLUS
1000.0000 mL | Freq: Once | INTRAVENOUS | Status: AC
  Administered 2020-12-03: 1000 mL via INTRAVENOUS

## 2020-12-03 MED ORDER — ENOXAPARIN SODIUM 40 MG/0.4ML ~~LOC~~ SOLN
40.0000 mg | SUBCUTANEOUS | Status: DC
  Administered 2020-12-03 – 2020-12-06 (×4): 40 mg via SUBCUTANEOUS
  Filled 2020-12-03 (×4): qty 0.4

## 2020-12-03 MED ORDER — SODIUM CHLORIDE 0.9 % IV SOLN
2.0000 g | Freq: Once | INTRAVENOUS | Status: AC
  Administered 2020-12-03: 2 g via INTRAVENOUS
  Filled 2020-12-03: qty 2

## 2020-12-03 MED ORDER — DEXTROSE IN LACTATED RINGERS 5 % IV SOLN: INTRAVENOUS | Status: DC

## 2020-12-03 MED ORDER — NOVOLOG FLEXPEN 100 UNIT/ML ~~LOC~~ SOPN: 10.0000 [IU] | PEN_INJECTOR | Freq: Three times a day (TID) | SUBCUTANEOUS | 6 refills | Status: DC

## 2020-12-03 NOTE — ED Provider Notes (Signed)
Joseph Barr   CSN: [id number] Arrival date & time: 12/03/20  1703     History Chief Complaint  Patient presents with  . Near Syncope    Joseph Barr is a 25 y.o. male.  25 year old male with prior medical history as detailed below presents for evaluation.  Patient with prior history of DKA.  Patient reports onset of nausea, vomiting, and diarrhea over the last 24 hours.  Patient reports his last admission for DKA was approximately 1 year prior.  He denies fever.  He denies abdominal pain.  The history is provided by the patient and medical records.  Illness Location:  Nausea, vomiting, diarrhea, hypotension Severity:  Moderate Onset quality:  Gradual Duration:  1 day Timing:  Constant Progression:  Worsening Chronicity:  New Associated symptoms: chest pain and vomiting        Past Medical History:  Diagnosis Date  . Diabetes mellitus     Patient Active Problem List   Diagnosis Date Noted  . SIRS (systemic inflammatory response syndrome) (HCC) 11/19/2019  . Renal insufficiency 11/19/2019  . Hyperkalemia 06/11/2017  . Tobacco abuse 06/11/2017  . Chest pain 06/11/2017  . Increased anion gap metabolic acidosis 06/11/2017  . DKA (diabetic ketoacidoses) 04/24/2015  . DKA, type 1 (HCC) 04/24/2015  . AKI (acute kidney injury) (HCC) 04/24/2015  . Nausea with vomiting 12/06/2014  . Diabetes mellitus type I (HCC) 01/21/2012  . Diabetes mellitus type 1 (HCC) 01/06/2012  . Hyperglycemia 01/06/2012  . Ketonuria 01/06/2012  . Headache(784.0) 01/06/2012    History reviewed. No pertinent surgical history.     Family History  Problem Relation Age of Onset  . Cancer Maternal Grandfather   . Hypertension Mother   . Diabetes Neg Hx     Social History   Tobacco Use  . Smoking status: Former Smoker    Types: E-cigarettes  . Smokeless tobacco: Former Clinical biochemist  . Vaping Use: Former  Substance Use Topics   . Alcohol use: No  . Drug use: Yes    Types: Marijuana    Home Medications Prior to Admission medications   Medication Sig Start Date End Date Taking? Authorizing Provider  atorvastatin (LIPITOR) 20 MG tablet Take 1 tablet (20 mg total) by mouth daily. 04/09/20   Claiborne Rigg, NP  glucose blood (ACCU-CHEK AVIVA) test strip 1 each by Other route 4 (four) times daily. And lancets 250.03 06/26/14   Romero Belling, MD  insulin aspart (NOVOLOG FLEXPEN) 100 UNIT/ML FlexPen Inject 10 Units into the skin 3 (three) times daily with meals. 10/04/20   Anders Simmonds, PA-C  insulin aspart (NOVOLOG FLEXPEN) 100 UNIT/ML FlexPen INJECT 10 UNITS INTO THE SKIN 3 (THREE) TIMES DAILY WITH MEALS. 12/01/19 11/30/20  Anders Simmonds, PA-C  insulin glargine (LANTUS SOLOSTAR) 100 UNIT/ML Solostar Pen Inject 30 Units into the skin daily. 04/09/20 07/08/20  Claiborne Rigg, NP  insulin glargine (LANTUS SOLOSTAR) 100 UNIT/ML Solostar Pen INJECT 30 UNITS INTO THE SKIN DAILY. 04/09/20 04/09/21  Claiborne Rigg, NP  Insulin Pen Needle (PEN NEEDLES 3/16") 31G X 5 MM MISC 100 Devices by Does not apply route in the morning, at noon, and at bedtime. 04/09/20   Claiborne Rigg, NP  lisinopril (ZESTRIL) 5 MG tablet Take 0.5 tablets (2.5 mg total) by mouth daily. 04/09/20   Claiborne Rigg, NP  pantoprazole (PROTONIX) 40 MG tablet Take 1 tablet (40 mg total) by mouth daily. 04/09/20 07/08/20  Meredeth Ide,  Shea Stakes, NP    Allergies    Patient has no known allergies.  Review of Systems   Review of Systems  Cardiovascular: Positive for chest pain.  Gastrointestinal: Positive for vomiting.  All other systems reviewed and are negative.   Physical Exam Updated Vital Signs BP 134/70   Pulse (!) 116   Temp 97.8 F (36.6 C)   Resp (!) 26   Ht 6' (1.829 m)   Wt 70.3 kg   SpO2 99%   BMI 21.02 kg/m   Physical Exam Vitals and nursing Barr reviewed.  Constitutional:      General: He is not in acute distress.    Appearance: He is  well-developed. He is ill-appearing.  HENT:     Head: Normocephalic and atraumatic.  Eyes:     Conjunctiva/sclera: Conjunctivae normal.     Pupils: Pupils are equal, round, and reactive to light.  Cardiovascular:     Rate and Rhythm: Regular rhythm. Tachycardia present.     Heart sounds: Normal heart sounds.  Pulmonary:     Effort: Pulmonary effort is normal. No respiratory distress.     Breath sounds: Normal breath sounds.  Abdominal:     General: There is no distension.     Palpations: Abdomen is soft.     Tenderness: There is no abdominal tenderness.  Musculoskeletal:        General: No deformity. Normal range of motion.     Cervical back: Normal range of motion and neck supple.  Skin:    General: Skin is warm and dry.  Neurological:     Mental Status: He is alert and oriented to person, place, and time.     ED Results / Procedures / Treatments   Labs (all labs ordered are listed, but only abnormal results are displayed) Labs Reviewed  LACTIC ACID, PLASMA - Abnormal; Notable for the following components:      Result Value   Lactic Acid, Venous 4.2 (*)    All other components within normal limits  COMPREHENSIVE METABOLIC PANEL - Abnormal; Notable for the following components:   Sodium 128 (*)    Chloride 86 (*)    CO2 12 (*)    Glucose, Bld 935 (*)    BUN 32 (*)    Creatinine, Ser 2.65 (*)    Total Protein 8.5 (*)    Total Bilirubin 1.8 (*)    GFR, Estimated 33 (*)    Anion gap 30 (*)    All other components within normal limits  CBC WITH DIFFERENTIAL/PLATELET - Abnormal; Notable for the following components:   WBC 33.1 (*)    Platelets 424 (*)    Neutro Abs 26.8 (*)    Monocytes Absolute 2.3 (*)    Abs Immature Granulocytes 0.33 (*)    All other components within normal limits  BLOOD GAS, VENOUS - Abnormal; Notable for the following components:   pH, Ven 7.096 (*)    pCO2, Ven 40.3 (*)    Bicarbonate 11.8 (*)    Acid-base deficit 17.9 (*)    All other  components within normal limits  CBG MONITORING, ED - Abnormal; Notable for the following components:   Glucose-Capillary >600 (*)    All other components within normal limits  RESP PANEL BY RT-PCR (FLU A&B, COVID) [id number]  LACTIC ACID, PLASMA    EKG EKG Interpretation  Date/Time:  Monday December 03 2020 18:02:43 EDT Ventricular Rate:  113 PR Interval:  121 QRS Duration: 98 QT Interval:  346  QTC Calculation: 475 R Axis:   85 Text Interpretation: Sinus tachycardia Nonspecific T abnormalities, inferior leads ST elev, probable normal early repol pattern Borderline prolonged QT interval Confirmed by Kristine RoyalMessick, Masiya Claassen [phone]([id number]) on 12/03/2020 6:07:49 PM   Radiology DG Chest Port 1 View  Result Date: 12/03/2020 CLINICAL DATA:  Chest pain EXAM: PORTABLE CHEST 1 VIEW COMPARISON:  11/19/2019 FINDINGS: The heart size and mediastinal contours are within normal limits. Both lungs are clear. The visualized skeletal structures are unremarkable. IMPRESSION: Normal study. Electronically Signed   By: Charlett NoseKevin  Dover M.D.   On: 12/03/2020 18:07    Procedures Procedures  CRITICAL CARE Performed by: Wynetta FinesPeter C Carlesha Seiple   Total critical care time: 45 minutes  Critical care time was exclusive of separately billable procedures and treating other patients.  Critical care was necessary to treat or prevent imminent or life-threatening deterioration.  Critical care was time spent personally by me on the following activities: development of treatment plan with patient and/or surrogate as well as nursing, discussions with consultants, evaluation of patient's response to treatment, examination of patient, obtaining history from patient or surrogate, ordering and performing treatments and interventions, ordering and review of laboratory studies, ordering and review of radiographic studies, pulse oximetry and re-evaluation of patient's condition.   Medications Ordered in ED Medications  insulin regular, human (MYXREDLIN)  100 units/ 100 mL infusion (has no administration in time range)  lactated ringers infusion (has no administration in time range)  dextrose 5 % in lactated ringers infusion (has no administration in time range)  dextrose 50 % solution 0-50 mL (has no administration in time range)  potassium chloride 10 mEq in 100 mL IVPB (has no administration in time range)  sodium chloride 0.9 % bolus 1,000 mL (0 mLs Intravenous Stopped 12/03/20 1908)  sodium chloride 0.9 % bolus 1,000 mL (0 mLs Intravenous Stopped 12/03/20 1908)    ED Course  I have reviewed the triage vital signs and the nursing notes.  Pertinent labs & imaging results that were available during my care of the patient were reviewed by me and considered in my medical decision making (see chart for details).    MDM Rules/Calculators/A&P                          MDM  MSE complete  Jacklynn LewisMatthew S Marines was evaluated in Emergency Department on 12/03/2020 for the symptoms described in the history of present illness. He was evaluated in the context of the global COVID-19 pandemic, which necessitated consideration that the patient might be at risk for infection with the SARS-CoV-2 virus that causes COVID-19. Institutional protocols and algorithms that pertain to the evaluation of patients at risk for COVID-19 are in a state of rapid change based on information released by regulatory bodies including the CDC and federal and state organizations. These policies and algorithms were followed during the patient's care in the ED.   Patient is presenting with nausea, vomiting, and diarrhea.  Presentation is concerning for likely DKA.  This diagnosis is confirmed with work-up.  Aggressive IV fluid resuscitation initiated in the ED.  Insulin drip initiated in the ED.  Patient will require admission for further work-up and treatment.  Patient understands plan of care.  Hospitalist service is aware of case and will evaluate for admission.    Final  Clinical Impression(s) / ED Diagnoses Final diagnoses:  Diabetic ketoacidosis without coma associated with other specified diabetes mellitus (HCC)    Rx / DC  Orders ED Discharge Orders    None       Wynetta Fines, MD 12/03/20 [phone]

## 2020-12-03 NOTE — Telephone Encounter (Signed)
Patient's brother was calling on behalf of the patient who reportedly was too weak to speak. Vomiting numerous times this morning. Gave himself insulin earlier today but unable to provide cbg. Advised UC now for evaluation.Brother, Casimiro Needle will drive him.

## 2020-12-03 NOTE — ED Notes (Signed)
Pt c/o 7/10 stabbing chest pain. Dr. Rodena Medin made aware.

## 2020-12-03 NOTE — H&P (Addendum)
History and Physical    Joseph Barr HAL:[id number] DOB: 1995-09-12 DOA: 12/03/2020  PCP: Claiborne Rigg, NP Patient coming from: Home  Chief Complaint: Nausea, vomiting, diarrhea  HPI: Joseph Barr is a 25 y.o. male with medical history significant of type 1 diabetes, DKA, hyperlipidemia, GERD presented to the ED with complaints of nausea, vomiting, and diarrhea.  In the ED, hypotensive with blood pressure 79/49 on arrival.  Tachycardic and slightly tachypneic.  Afebrile and not hypoxic.  Labs showing WBC 33.1 with neutrophilic predominance, hemoglobin 15.6, platelet count 424K.  Sodium 128, potassium 3.6, chloride 86, bicarb 12, anion gap 30, BUN 32, creatinine 2.6, glucose 935.  T bili 1.8, remainder of LFTs normal.  Lactic acid 4.2.  VBG showing pH 7.0.  Covid and influenza PCR pending.  Chest x-ray showing no active disease. Patient was given 2 L fluid boluses, potassium supplementation, and started on insulin per DKA protocol.  Patient states he has been vomiting since last night and has not been able to keep anything down.  He checked his blood sugar this morning and it was around 190.  Reports being diagnosed with diabetes at the age of 32 and his current home insulin regimen includes Lantus 45 units at bedtime and NovoLog sliding scale insulin with meals.  Patient states he has not missed any doses of his insulin and normally when he checks his blood sugar at home it is always less than 200.  He is complaining of right-sided stabbing chest pain which started 3 days ago and has been constant since then.  He had one episode of diarrhea yesterday.  Denies abdominal pain.  Denies headaches or neck stiffness.  He is not vaccinated against COVID.  No other complaints.  Review of Systems:  All systems reviewed and apart from history of presenting illness, are negative.  Past Medical History:  Diagnosis Date  . Diabetes mellitus     History reviewed. No pertinent surgical history.   reports  that he has quit smoking. His smoking use included e-cigarettes. He has quit using smokeless tobacco. He reports current drug use. Drug: Marijuana. He reports that he does not drink alcohol.  No Known Allergies  Family History  Problem Relation Age of Onset  . Cancer Maternal Grandfather   . Hypertension Mother   . Diabetes Neg Hx     Prior to Admission medications   Medication Sig Start Date End Date Taking? Authorizing Provider  atorvastatin (LIPITOR) 20 MG tablet Take 1 tablet (20 mg total) by mouth daily. 04/09/20   Claiborne Rigg, NP  glucose blood (ACCU-CHEK AVIVA) test strip 1 each by Other route 4 (four) times daily. And lancets 250.03 06/26/14   Romero Belling, MD  insulin aspart (NOVOLOG FLEXPEN) 100 UNIT/ML FlexPen Inject 10 Units into the skin 3 (three) times daily with meals. 10/04/20   Anders Simmonds, PA-C  insulin aspart (NOVOLOG FLEXPEN) 100 UNIT/ML FlexPen INJECT 10 UNITS INTO THE SKIN 3 (THREE) TIMES DAILY WITH MEALS. 12/01/19 11/30/20  Anders Simmonds, PA-C  insulin glargine (LANTUS SOLOSTAR) 100 UNIT/ML Solostar Pen Inject 30 Units into the skin daily. 04/09/20 07/08/20  Claiborne Rigg, NP  insulin glargine (LANTUS SOLOSTAR) 100 UNIT/ML Solostar Pen INJECT 30 UNITS INTO THE SKIN DAILY. 04/09/20 04/09/21  Claiborne Rigg, NP  Insulin Pen Needle (PEN NEEDLES 3/16") 31G X 5 MM MISC 100 Devices by Does not apply route in the morning, at noon, and at bedtime. 04/09/20   Claiborne Rigg,  NP  lisinopril (ZESTRIL) 5 MG tablet Take 0.5 tablets (2.5 mg total) by mouth daily. 04/09/20   Claiborne Rigg, NP  pantoprazole (PROTONIX) 40 MG tablet Take 1 tablet (40 mg total) by mouth daily. 04/09/20 07/08/20  Claiborne Rigg, NP    Physical Exam: Vitals:   12/03/20 1915 12/03/20 1930 12/03/20 1945 12/03/20 2000  BP: 105/62 102/63 (!) 101/59 110/74  Pulse: (!) 123 (!) 119 (!) 124 (!) 126  Resp: (!) 29 (!) 22 20 15   Temp:      SpO2: 99% 98% 98% 99%  Weight:      Height:        Physical  Exam Constitutional:      General: He is not in acute distress. HENT:     Head: Normocephalic and atraumatic.     Mouth/Throat:     Mouth: Mucous membranes are dry.  Eyes:     Extraocular Movements: Extraocular movements intact.     Conjunctiva/sclera: Conjunctivae normal.  Cardiovascular:     Rate and Rhythm: Regular rhythm. Tachycardia present.     Pulses: Normal pulses.  Pulmonary:     Effort: Pulmonary effort is normal.     Breath sounds: Normal breath sounds. No wheezing or rales.  Abdominal:     General: Bowel sounds are normal. There is no distension.     Palpations: Abdomen is soft.     Tenderness: There is no abdominal tenderness. There is no guarding or rebound.  Musculoskeletal:        General: No swelling or tenderness.     Cervical back: Normal range of motion and neck supple.     Comments: Right-sided chest wall tenderness on exam  Skin:    General: Skin is warm and dry.  Neurological:     General: No focal deficit present.     Mental Status: He is alert and oriented to person, place, and time.     Labs on Admission: I have personally reviewed following labs and imaging studies  CBC: Recent Labs  Lab 12/03/20 1743  WBC 33.1*  NEUTROABS 26.8*  HGB 15.6  HCT 46.5  MCV 88.9  PLT 424*   Basic Metabolic Panel: Recent Labs  Lab 12/03/20 1743  NA 128*  K 3.6  CL 86*  CO2 12*  GLUCOSE 935*  BUN 32*  CREATININE 2.65*  CALCIUM 9.3   GFR: Estimated Creatinine Clearance: 42.4 mL/min (A) (by C-G formula based on SCr of 2.65 mg/dL (H)). Liver Function Tests: Recent Labs  Lab 12/03/20 1743  AST 23  ALT 25  ALKPHOS 122  BILITOT 1.8*  PROT 8.5*  ALBUMIN 4.7   No results for input(s): LIPASE, AMYLASE in the last 168 hours. No results for input(s): AMMONIA in the last 168 hours. Coagulation Profile: No results for input(s): INR, PROTIME in the last 168 hours. Cardiac Enzymes: No results for input(s): CKTOTAL, CKMB, CKMBINDEX, TROPONINI in the  last 168 hours. BNP (last 3 results) No results for input(s): PROBNP in the last 8760 hours. HbA1C: No results for input(s): HGBA1C in the last 72 hours. CBG: Recent Labs  Lab 12/03/20 1713  GLUCAP >600*   Lipid Profile: No results for input(s): CHOL, HDL, LDLCALC, TRIG, CHOLHDL, LDLDIRECT in the last 72 hours. Thyroid Function Tests: No results for input(s): TSH, T4TOTAL, FREET4, T3FREE, THYROIDAB in the last 72 hours. Anemia Panel: No results for input(s): VITAMINB12, FOLATE, FERRITIN, TIBC, IRON, RETICCTPCT in the last 72 hours. Urine analysis:    Component Value Date/Time  COLORURINE STRAW (A) 11/19/2019 0100   APPEARANCEUR CLEAR 11/19/2019 0100   LABSPEC 1.024 11/19/2019 0100   PHURINE 6.0 11/19/2019 0100   GLUCOSEU >=500 (A) 11/19/2019 0100   HGBUR SMALL (A) 11/19/2019 0100   BILIRUBINUR NEGATIVE 11/19/2019 0100   BILIRUBINUR Negative 08/01/2016 0950   KETONESUR 80 (A) 11/19/2019 0100   PROTEINUR NEGATIVE 11/19/2019 0100   UROBILINOGEN 0.2 08/01/2016 0950   UROBILINOGEN 0.2 04/24/2015 1305   NITRITE NEGATIVE 11/19/2019 0100   LEUKOCYTESUR NEGATIVE 11/19/2019 0100    Radiological Exams on Admission: DG Chest Port 1 View  Result Date: 12/03/2020 CLINICAL DATA:  Chest pain EXAM: PORTABLE CHEST 1 VIEW COMPARISON:  11/19/2019 FINDINGS: The heart size and mediastinal contours are within normal limits. Both lungs are clear. The visualized skeletal structures are unremarkable. IMPRESSION: Normal study. Electronically Signed   By: Charlett Nose M.D.   On: 12/03/2020 18:07    EKG: Independently reviewed.  Sinus tachycardia, nonspecific ST/T wave abnormalities.  Assessment/Plan Principal Problem:   DKA, type 1 (HCC) Active Problems:   AKI (acute kidney injury) (HCC)   SIRS (systemic inflammatory response syndrome) (HCC)   Pseudohyponatremia   Hypochloremia   DKA, type 1 diabetes Blood glucose significantly elevated at 935.  Bicarb 12, anion gap 30.  VBG showing pH 7.0.  ?Infection as precipitating factor as he does have significant leukocytosis on labs.  Patient reports compliance with his home insulin regimen. -Continue IV insulin and fluids per DKA protocol.  Check beta hydroxybutyric acid level.  Monitor BMP every 4 hours.  Start bicarb drip if bicarb still low on repeat labs.  Frequent CBG checks per Endo tool.  When DKA resolves and able to eat, initiate subcutaneous insulin and diet.  Continue IV insulin for an additional 1 to 2 hours to ensure adequate plasma insulin levels.  Consult diabetes coordinator.  SIRS vs possible severe sepsis Tachycardic, tachypneic, and hypotensive on arrival.  Labs showing significant leukocytosis (WBC 33.1 with neutrophilic predominance) and lactic acidosis (lactic acid 4.2).  However, patient is afebrile and no obvious infectious source identified so far.  Chest x-ray not suggestive of pneumonia.  No headaches or meningeal signs.  Complaining of GI upset symptoms but denies abdominal pain and abdominal exam benign. ?Viral gastroenteritis as he is not vaccinated against COVID.  Blood pressure has now improved after IV fluid boluses. -Will cover with broad-spectrum antibiotics at this time including vancomycin, cefepime, and metronidazole.  Continue IV fluid resuscitation.  Order urinalysis and urine culture.  Order blood culture x2.  Check procalcitonin level.  Continue to monitor WBC count.  Trend lactate.  Covid and influenza PCR pending, isolation precautions.  AKI Likely prerenal azotemia from dehydration in the setting of intractable vomiting.  BUN 32, creatinine 2.6 (baseline around 0.8). -IV fluid hydration.  Monitor renal function and urine output.  Avoid nephrotoxic agents/hold home lisinopril.  Pseudohyponatremia Due to hyperglycemia.  Corrected sodium 141. -Continue to monitor  Hypochloremia Likely due to vomiting. -IV fluid hydration, continue to monitor  Elevated bilirubin Bili 1.8, remainder of LFTs  normal. -Right upper quadrant ultrasound ordered given complaints of vomiting  Chest pain Likely musculoskeletal.  Right-sided and ongoing for the past 3 days, reproducible on exam.  EKG showing nonspecific ST/T wave abnormalities. -Check high-sensitivity troponin level  DVT prophylaxis: Lovenox Code Status: Full code Family Communication: No family at bedside. Disposition Plan: Status is: Inpatient  Remains inpatient appropriate because:IV treatments appropriate due to intensity of illness or inability to take PO and Inpatient level  of care appropriate due to severity of illness   Dispo: The patient is from: Home              Anticipated d/c is to: Home              Patient currently is not medically stable to d/c.   Difficult to place patient No  Level of care: Level of care: Stepdown   The medical decision making on this patient was of high complexity and the patient is at high risk for clinical deterioration, therefore this is a level 3 visit.  Joseph GiovanniVasundhra Carlea Badour MD [facility]  If 7PM-7AM, please contact night-coverage [email or web address]  12/03/2020, 8:03 PM

## 2020-12-03 NOTE — Progress Notes (Signed)
Pharmacy Antibiotic Note  Joseph Barr is a 25 y.o. male admitted on 12/03/2020 with sepsis.  Pharmacy has been consulted for cefepime and vancomycin dosing.  Plan: Cefepime 2 g IV every 12 hours Vancomycin 1000 mg IV every 24 hours (Goal AUC 400-550, Expected AUC: 457.1, SCr used: 2.65) Monitor clinical picture, renal function, vancomycin levels if indicated F/U C&S, abx deescalation / LOT   Height: 6' (182.9 cm) Weight: 70.3 kg (155 lb) IBW/kg (Calculated) : 77.6  Temp (24hrs), Avg:97.8 F (36.6 C), Min:97.8 F (36.6 C), Max:97.8 F (36.6 C)  Recent Labs  Lab 12/03/20 1743  WBC 33.1*  CREATININE 2.65*  LATICACIDVEN 4.2*    Estimated Creatinine Clearance: 42.4 mL/min (A) (by C-G formula based on SCr of 2.65 mg/dL (H)).    No Known Allergies   Microbiology results: 4/4 Resp Panel: negative  Thank you for allowing pharmacy to be a part of this patient's care.  Royce Macadamia, PharmD, BCPS 12/03/2020 8:38 PM

## 2020-12-03 NOTE — ED Triage Notes (Addendum)
Patient BIBA for n/v/d for the past day. EMS reports BP 90/60 upon arrival, and patient became near syncopal when attempting to stand up. EMS also reports patient is lethargic and has pinpoint pupils. Hx DM 1.  20 G LAC \ 400 cc fluid and 4 mg zofran  CBG 323 HR 110 100% RA RR 16

## 2020-12-04 ENCOUNTER — Inpatient Hospital Stay (HOSPITAL_COMMUNITY): Payer: Medicaid Other

## 2020-12-04 DIAGNOSIS — N179 Acute kidney failure, unspecified: Secondary | ICD-10-CM

## 2020-12-04 DIAGNOSIS — R112 Nausea with vomiting, unspecified: Secondary | ICD-10-CM

## 2020-12-04 DIAGNOSIS — R6510 Systemic inflammatory response syndrome (SIRS) of non-infectious origin without acute organ dysfunction: Secondary | ICD-10-CM

## 2020-12-04 LAB — GLUCOSE, CAPILLARY
Glucose-Capillary: 130 mg/dL — ABNORMAL HIGH (ref 70–99)
Glucose-Capillary: 150 mg/dL — ABNORMAL HIGH (ref 70–99)
Glucose-Capillary: 159 mg/dL — ABNORMAL HIGH (ref 70–99)
Glucose-Capillary: 160 mg/dL — ABNORMAL HIGH (ref 70–99)
Glucose-Capillary: 160 mg/dL — ABNORMAL HIGH (ref 70–99)
Glucose-Capillary: 173 mg/dL — ABNORMAL HIGH (ref 70–99)
Glucose-Capillary: 191 mg/dL — ABNORMAL HIGH (ref 70–99)
Glucose-Capillary: 194 mg/dL — ABNORMAL HIGH (ref 70–99)
Glucose-Capillary: 198 mg/dL — ABNORMAL HIGH (ref 70–99)
Glucose-Capillary: 204 mg/dL — ABNORMAL HIGH (ref 70–99)
Glucose-Capillary: 225 mg/dL — ABNORMAL HIGH (ref 70–99)
Glucose-Capillary: 246 mg/dL — ABNORMAL HIGH (ref 70–99)
Glucose-Capillary: 331 mg/dL — ABNORMAL HIGH (ref 70–99)

## 2020-12-04 LAB — TROPONIN I (HIGH SENSITIVITY)
Troponin I (High Sensitivity): 2 ng/L (ref <18)
Troponin I (High Sensitivity): 2 ng/L (ref <18)

## 2020-12-04 LAB — URINALYSIS, ROUTINE W REFLEX MICROSCOPIC
Bilirubin Urine: NEGATIVE
Glucose, UA: 150 mg/dL — AB
Hgb urine dipstick: NEGATIVE
Ketones, ur: 20 mg/dL — AB
Leukocytes,Ua: NEGATIVE
Nitrite: NEGATIVE
Protein, ur: NEGATIVE mg/dL
Specific Gravity, Urine: 1.009 (ref 1.005–1.030)
pH: 9 — ABNORMAL HIGH (ref 5.0–8.0)

## 2020-12-04 LAB — BASIC METABOLIC PANEL
Anion gap: 28 — ABNORMAL HIGH (ref 5–15)
Anion gap: 9 (ref 5–15)
Anion gap: 8 (ref 5–15)
BUN: 32 mg/dL — ABNORMAL HIGH (ref 6–20)
BUN: 21 mg/dL — ABNORMAL HIGH (ref 6–20)
BUN: 17 mg/dL (ref 6–20)
CO2: 12 mmol/L — ABNORMAL LOW (ref 22–32)
CO2: 25 mmol/L (ref 22–32)
CO2: 31 mmol/L (ref 22–32)
Calcium: 9.3 mg/dL (ref 8.9–10.3)
Calcium: 8.5 mg/dL — ABNORMAL LOW (ref 8.9–10.3)
Calcium: 8.1 mg/dL — ABNORMAL LOW (ref 8.9–10.3)
Chloride: 85 mmol/L — ABNORMAL LOW (ref 98–111)
Chloride: 106 mmol/L (ref 98–111)
Chloride: 106 mmol/L (ref 98–111)
Creatinine, Ser: 2.43 mg/dL — ABNORMAL HIGH (ref 0.61–1.24)
Creatinine, Ser: 1.2 mg/dL (ref 0.61–1.24)
Creatinine, Ser: 0.96 mg/dL (ref 0.61–1.24)
GFR, Estimated: 37 mL/min — ABNORMAL LOW (ref >60)
GFR, Estimated: >60 mL/min (ref >60)
GFR, Estimated: >60 mL/min (ref >60)
Glucose, Bld: 920 mg/dL (ref 70–99)
Glucose, Bld: 246 mg/dL — ABNORMAL HIGH (ref 70–99)
Glucose, Bld: 143 mg/dL — ABNORMAL HIGH (ref 70–99)
Potassium: 3.6 mmol/L (ref 3.5–5.1)
Potassium: 3.3 mmol/L — ABNORMAL LOW (ref 3.5–5.1)
Potassium: 3.3 mmol/L — ABNORMAL LOW (ref 3.5–5.1)
Sodium: 125 mmol/L — ABNORMAL LOW (ref 135–145)
Sodium: 140 mmol/L (ref 135–145)
Sodium: 145 mmol/L (ref 135–145)

## 2020-12-04 LAB — CBC
HCT: 35.4 % — ABNORMAL LOW (ref 39.0–52.0)
Hemoglobin: 12.8 g/dL — ABNORMAL LOW (ref 13.0–17.0)
MCH: 29.6 pg (ref 26.0–34.0)
MCHC: 36.2 g/dL — ABNORMAL HIGH (ref 30.0–36.0)
MCV: 81.8 fL (ref 80.0–100.0)
Platelets: 333 10*3/uL (ref 150–400)
RBC: 4.33 MIL/uL (ref 4.22–5.81)
RDW: 11.5 % (ref 11.5–15.5)
WBC: 21.7 10*3/uL — ABNORMAL HIGH (ref 4.0–10.5)
nRBC: 0 % (ref 0.0–0.2)

## 2020-12-04 LAB — PROCALCITONIN: Procalcitonin: 19.42 ng/mL

## 2020-12-04 LAB — MRSA PCR SCREENING: MRSA by PCR: NEGATIVE

## 2020-12-04 LAB — MAGNESIUM: Magnesium: 1.7 mg/dL (ref 1.7–2.4)

## 2020-12-04 LAB — LACTIC ACID, PLASMA
Lactic Acid, Venous: 2.9 mmol/L (ref 0.5–1.9)
Lactic Acid, Venous: 1.1 mmol/L (ref 0.5–1.9)
Lactic Acid, Venous: 3.5 mmol/L (ref 0.5–1.9)

## 2020-12-04 LAB — PHOSPHORUS: Phosphorus: 2.3 mg/dL — ABNORMAL LOW (ref 2.5–4.6)

## 2020-12-04 LAB — HIV ANTIBODY (ROUTINE TESTING W REFLEX): HIV Screen 4th Generation wRfx: NONREACTIVE

## 2020-12-04 MED ORDER — SODIUM CHLORIDE 0.9 % IV SOLN: INTRAVENOUS | Status: DC

## 2020-12-04 MED ORDER — LACTATED RINGERS IV BOLUS (SEPSIS)
500.0000 mL | Freq: Once | INTRAVENOUS | Status: AC
  Administered 2020-12-04: 500 mL via INTRAVENOUS

## 2020-12-04 MED ORDER — METRONIDAZOLE IN NACL 5-0.79 MG/ML-% IV SOLN
500.0000 mg | Freq: Three times a day (TID) | INTRAVENOUS | Status: DC
  Administered 2020-12-04 – 2020-12-05 (×2): 500 mg via INTRAVENOUS
  Filled 2020-12-04 (×2): qty 100

## 2020-12-04 MED ORDER — INSULIN GLARGINE 100 UNIT/ML ~~LOC~~ SOLN
15.0000 [IU] | Freq: Every day | SUBCUTANEOUS | Status: DC
  Administered 2020-12-04: 15 [IU] via SUBCUTANEOUS
  Filled 2020-12-04 (×2): qty 0.15

## 2020-12-04 MED ORDER — POTASSIUM CHLORIDE 10 MEQ/100ML IV SOLN
10.0000 meq | INTRAVENOUS | Status: AC
Start: 2020-12-04 — End: 2020-12-04
  Administered 2020-12-04 (×4): 10 meq via INTRAVENOUS
  Filled 2020-12-04 (×3): qty 100

## 2020-12-04 MED ORDER — POTASSIUM CHLORIDE 10 MEQ/100ML IV SOLN
INTRAVENOUS | Status: AC
  Filled 2020-12-04: qty 100

## 2020-12-04 MED ORDER — SODIUM CHLORIDE 0.9 % IV SOLN
2.0000 g | Freq: Three times a day (TID) | INTRAVENOUS | Status: DC
  Administered 2020-12-04 – 2020-12-06 (×5): 2 g via INTRAVENOUS
  Filled 2020-12-04 (×6): qty 2

## 2020-12-04 MED ORDER — ONDANSETRON HCL 4 MG/2ML IJ SOLN
INTRAMUSCULAR | Status: AC
  Filled 2020-12-04: qty 2

## 2020-12-04 MED ORDER — VANCOMYCIN HCL 750 MG/150ML IV SOLN
750.0000 mg | Freq: Two times a day (BID) | INTRAVENOUS | Status: DC
  Filled 2020-12-04 (×2): qty 150

## 2020-12-04 MED ORDER — SODIUM CHLORIDE 0.9 % IV BOLUS
1000.0000 mL | Freq: Once | INTRAVENOUS | Status: AC
  Administered 2020-12-04: 1000 mL via INTRAVENOUS

## 2020-12-04 MED ORDER — INSULIN ASPART 100 UNIT/ML ~~LOC~~ SOLN
0.0000 [IU] | Freq: Every day | SUBCUTANEOUS | Status: DC
  Administered 2020-12-04: 2 [IU] via SUBCUTANEOUS

## 2020-12-04 MED ORDER — POTASSIUM PHOSPHATES 15 MMOLE/5ML IV SOLN
20.0000 mmol | Freq: Once | INTRAVENOUS | Status: AC
  Administered 2020-12-04: 20 mmol via INTRAVENOUS
  Filled 2020-12-04 (×2): qty 6.67

## 2020-12-04 MED ORDER — POTASSIUM CHLORIDE CRYS ER 20 MEQ PO TBCR
40.0000 meq | EXTENDED_RELEASE_TABLET | ORAL | Status: DC
  Filled 2020-12-04: qty 2

## 2020-12-04 MED ORDER — POTASSIUM CHLORIDE 10 MEQ/100ML IV SOLN
10.0000 meq | INTRAVENOUS | Status: AC
  Administered 2020-12-04 (×3): 10 meq via INTRAVENOUS
  Filled 2020-12-04 (×2): qty 100

## 2020-12-04 MED ORDER — STERILE WATER FOR INJECTION IV SOLN
INTRAVENOUS | Status: DC
  Filled 2020-12-04 (×2): qty 850
  Filled 2020-12-04: qty 127.5
  Filled 2020-12-04: qty 850

## 2020-12-04 MED ORDER — VANCOMYCIN HCL 750 MG/150ML IV SOLN
750.0000 mg | Freq: Two times a day (BID) | INTRAVENOUS | Status: DC
  Administered 2020-12-04 – 2020-12-05 (×3): 750 mg via INTRAVENOUS
  Filled 2020-12-04 (×5): qty 150

## 2020-12-04 MED ORDER — ONDANSETRON HCL 4 MG/2ML IJ SOLN
4.0000 mg | Freq: Four times a day (QID) | INTRAMUSCULAR | Status: AC | PRN
  Administered 2020-12-04 (×2): 4 mg via INTRAVENOUS
  Filled 2020-12-04: qty 2

## 2020-12-04 MED ORDER — DEXTROSE 5 % IN LACTATED RINGERS IV BOLUS
500.0000 mL | Freq: Once | INTRAVENOUS | Status: AC
  Administered 2020-12-04: 500 mL via INTRAVENOUS

## 2020-12-04 MED ORDER — METRONIDAZOLE IN NACL 5-0.79 MG/ML-% IV SOLN
500.0000 mg | Freq: Three times a day (TID) | INTRAVENOUS | Status: DC
  Administered 2020-12-04: 500 mg via INTRAVENOUS
  Filled 2020-12-04: qty 100

## 2020-12-04 MED ORDER — LACTATED RINGERS IV BOLUS
1000.0000 mL | Freq: Once | INTRAVENOUS | Status: AC
  Administered 2020-12-04: 1000 mL via INTRAVENOUS

## 2020-12-04 MED ORDER — SODIUM CHLORIDE 0.9 % IV SOLN: 2.0000 g | Freq: Three times a day (TID) | INTRAVENOUS | Status: DC

## 2020-12-04 MED ORDER — INSULIN ASPART 100 UNIT/ML ~~LOC~~ SOLN
0.0000 [IU] | Freq: Three times a day (TID) | SUBCUTANEOUS | Status: DC
  Administered 2020-12-04: 1 [IU] via SUBCUTANEOUS
  Administered 2020-12-05: 2 [IU] via SUBCUTANEOUS
  Administered 2020-12-05: 5 [IU] via SUBCUTANEOUS
  Administered 2020-12-05 – 2020-12-06 (×2): 3 [IU] via SUBCUTANEOUS

## 2020-12-04 MED ORDER — STERILE WATER FOR INJECTION IV SOLN: INTRAVENOUS | Status: DC

## 2020-12-04 NOTE — Progress Notes (Signed)
   12/04/20 2000  Assess: MEWS Score  Temp (!) 101.9 F (38.8 C)  BP (!) 90/57  Pulse Rate (!) 121  ECG Heart Rate (!) 116  Resp 15  Level of Consciousness Alert  SpO2 98 %  O2 Device Room Air  Patient Activity (if Appropriate) In bed  Assess: MEWS Score  MEWS Temp 2  MEWS Systolic 1  MEWS Pulse 2  MEWS RR 0  MEWS LOC 0  MEWS Score 5  MEWS Score Color Red  Assess: if the MEWS score is Yellow or Red  Were vital signs taken at a resting state? Yes  Focused Assessment No change from prior assessment  Early Detection of Sepsis Score *See Row Information* Medium  MEWS guidelines implemented *See Row Information* No, vital signs rechecked  Treat  MEWS Interventions Escalated (See documentation below)  Pain Scale 0-10  Pain Score 0  Escalate  MEWS: Escalate Red: discuss with charge nurse/RN and provider, consider discussing with RRT  Notify: Charge Nurse/RN  Name of Charge Nurse/RN Notified Britta Mccreedy RN  Date Charge Nurse/RN Notified 12/04/20  Time Charge Nurse/RN Notified 2020  Notify: Provider  Provider Name/Title T. Opyd M.D.  Date Provider Notified 12/04/20  Time Provider Notified 2020  Notification Type Page  Notification Reason Other (Comment) (Febrile)  Provider response See new orders  Date of Provider Response 12/04/20  Time of Provider Response 2031

## 2020-12-04 NOTE — Progress Notes (Signed)
Cross-coverage note:   Patient admitted last night with DKA and SIRS now febrile to 38.8 C with persistent tachycardia and SBP trending back down to 90/57.    He was given empiric antibiotics last night in light of hypotension, tachycardia, and marked leukocytosis but no evidence for bacterial infection was identified, SIRS parameters were improving, and further antibiotics were held.   Plan to resume empiric antibiotic coverage for now, give additional IVF, trend lactate and procalcitonin, and follow cultures and clinical course.

## 2020-12-04 NOTE — TOC Initial Note (Signed)
Transition of Care [facility]) - Initial/Assessment Note    Patient Details  Name: Joseph Barr MRN: [id number] Date of Birth: [birth year]  Transition of Care [employer]) CM/SW Contact:    Golda Acre, RN Phone Number: 12/04/2020, 8:02 AM  Clinical Narrative:                 25 y.o. male with medical history significant of type 1 diabetes, DKA, hyperlipidemia, GERD presented to the ED with complaints of nausea, vomiting, and diarrhea.  In the ED, hypotensive with blood pressure 79/49 on arrival.  Tachycardic and slightly tachypneic.  Afebrile and not hypoxic.  Labs showing WBC 33.1 with neutrophilic predominance, hemoglobin 15.6, platelet count 424K.  Sodium 128, potassium 3.6, chloride 86, bicarb 12, anion gap 30, BUN 32, creatinine 2.6, glucose 935.  T bili 1.8, remainder of LFTs normal.  Lactic acid 4.2.  VBG showing pH 7.0.  Covid and influenza PCR pending.  Chest x-ray showing no active disease. Patient was given 2 L fluid boluses, potassium supplementation, and started on insulin per DKA protocol.  Patient states he has been vomiting since last night and has not been able to keep anything down.  He checked his blood sugar this morning and it was around 190.  Reports being diagnosed with diabetes at the age of 60 and his current home insulin regimen includes Lantus 45 units at bedtime and NovoLog sliding scale insulin with meals.  Patient states he has not missed any doses of his insulin and normally when he checks his blood sugar at home it is always less than 200.  He is complaining of right-sided stabbing chest pain which started 3 days ago and has been constant since then.  He had one episode of diarrhea yesterday.  Denies abdominal pain.  Denies headaches or neck stiffness.  He is not vaccinated against COVID.  No other complaints. PLAN:to return to home, will need to be seen by the Diabetic Coordinator. Expected Discharge Plan: Home/Self Care Barriers to Discharge: Continued Medical Work  up   Patient Goals and CMS Choice Patient states their goals for this hospitalization and ongoing recovery are:: to go home CMS Medicare.gov Compare Post Acute Care list provided to:: Patient Choice offered to / list presented to : Patient  Expected Discharge Plan and Services Expected Discharge Plan: Home/Self Care   Discharge Planning Services: CM Consult   Living arrangements for the past 2 months: Single Family Home                                      Prior Living Arrangements/Services Living arrangements for the past 2 months: Single Family Home Lives with:: Self Patient language and need for interpreter reviewed:: Yes Do you feel safe going back to the place where you live?: Yes      Need for Family Participation in Patient Care: No (Comment) Care giver support system in place?: No (comment)   Criminal Activity/Legal Involvement Pertinent to Current Situation/Hospitalization: No - Comment as needed  Activities of Daily Living Home Assistive Devices/Equipment: CBG Meter ADL Screening (condition at time of admission) Patient's cognitive ability adequate to safely complete daily activities?: Yes Is the patient deaf or have difficulty hearing?: No Does the patient have difficulty seeing, even when wearing glasses/contacts?: No Does the patient have difficulty concentrating, remembering, or making decisions?: No Patient able to express need for assistance with ADLs?: Yes Does the patient  have difficulty dressing or bathing?: No Independently performs ADLs?: Yes (appropriate for developmental age) Does the patient have difficulty walking or climbing stairs?: Yes Weakness of Legs: Both Weakness of Arms/Hands: Both  Permission Sought/Granted                  Emotional Assessment Appearance:: Appears stated age Attitude/Demeanor/Rapport: Engaged Affect (typically observed): Calm Orientation: : Oriented to Place,Oriented to Self,Oriented to  Time,Oriented to  Situation Alcohol / Substance Use: Not Applicable Psych Involvement: No (comment)  Admission diagnosis:  Nausea & vomiting [R11.2] DKA, type 1 (HCC) [E10.10] Diabetic ketoacidosis without coma associated with other specified diabetes mellitus (HCC) [E13.10] Patient Active Problem List   Diagnosis Date Noted  . Pseudohyponatremia 12/03/2020  . Hypochloremia 12/03/2020  . SIRS (systemic inflammatory response syndrome) (HCC) 11/19/2019  . Renal insufficiency 11/19/2019  . Hyperkalemia 06/11/2017  . Tobacco abuse 06/11/2017  . Chest pain 06/11/2017  . Increased anion gap metabolic acidosis 06/11/2017  . DKA (diabetic ketoacidoses) 04/24/2015  . DKA, type 1 (HCC) 04/24/2015  . AKI (acute kidney injury) (HCC) 04/24/2015  . Nausea with vomiting 12/06/2014  . Diabetes mellitus type I (HCC) 01/21/2012  . Diabetes mellitus type 1 (HCC) 01/06/2012  . Hyperglycemia 01/06/2012  . Ketonuria 01/06/2012  . Headache(784.0) 01/06/2012   PCP:  Claiborne Rigg, NP Pharmacy:   [address] [place], [place] - [address] [place] [address] Phone: [phone] Fax: [phone]  [facility] [address] [place] [address] Phone: [phone] Fax: [phone]     Social Determinants of Health (SDOH) Interventions    Readmission Risk Interventions Readmission Risk Prevention Plan 11/21/2019  Post Dischage Appt Not Complete  Appt Comments continue to work to re-establish pt at [facility] vs alternate health clinic  Medication Screening Complete  Transportation Screening Complete  Some recent data might be hidden

## 2020-12-04 NOTE — Progress Notes (Signed)
Pharmacy Antibiotic Note  Joseph Barr is a 25 y.o. male admitted on 12/03/2020 with sepsis.  Pharmacy has been consulted for cefepime and vancomycin dosing. 12/04/2020  Tm 100.3, WBC down to 21.7, SCr down to 0.96  Plan: Increase Cefepime to  2 g IV every 8 hours Vancomycin DC'd by TRH Flagyl 500 mg IV q8h per [facility] Monitor clinical picture, renal function F/U C&S, abx de-escalation / LOT   Height: 6' (182.9 cm) Weight: 58 kg (127 lb 13.9 oz) IBW/kg (Calculated) : 77.6  Temp (24hrs), Avg:98.9 F (37.2 C), Min:97.8 F (36.6 C), Max:100.3 F (37.9 C)  Recent Labs  Lab 12/03/20 1743 12/03/20 2210 12/04/20 0253 12/04/20 1057  WBC 33.1*  --  21.7*  --   CREATININE 2.65* 2.43* 1.20 0.96  LATICACIDVEN 4.2* 4.1* 2.9*  --     Estimated Creatinine Clearance: 96.5 mL/min (by C-G formula based on SCr of 0.96 mg/dL).    No Known Allergies  Antimicrobials this admission:  4/4 cefepime> 4/4 flagyl>> 4/5 vanc>>4/5 got 1 dose Dose adjustments this admission:  4/5 V 1 gm q24 incr to 1 gm q12  4/5 Cefepime q12>> q8h Microbiology results:  4/4 MRSA - 4/4 flu/covid - 4/4 HIV NR 4/4 BCx2: ngtd  Thank you for allowing pharmacy to be a part of this patient's care.  Herby Abraham, Pharm.D 12/04/2020 1:06 PM

## 2020-12-04 NOTE — Progress Notes (Signed)
Pharmacy Antibiotic Note  MERWIN BREDEN is a 25 y.o. male presented the ED on 12/03/2020 with n/v/d.  He was started on vancomycin, cefepime and flagyl an admission for suspected sepsis. Abx were then d/ced shortly after with plan to observe pt off abx. He's now febrile and pharmacy is consulted to resume vancomycin and cefepime.  Plan: - cefepime 2gm IV q8h - vancomycin 750mg  IV q12h for est AUC 425 - flagyl 500mg  IV q8h per MD  ______________________________________  Height: 6' (182.9 cm) Weight: 58 kg (127 lb 13.9 oz) IBW/kg (Calculated) : 77.6  Temp (24hrs), Avg:99.8 F (37.7 C), Min:98.3 F (36.8 C), Max:101.9 F (38.8 C)  Recent Labs  Lab 12/03/20 1743 12/03/20 2210 12/04/20 0253 12/04/20 1057  WBC 33.1*  --  21.7*  --   CREATININE 2.65* 2.43* 1.20 0.96  LATICACIDVEN 4.2* 4.1* 2.9*  --     Estimated Creatinine Clearance: 96.5 mL/min (by C-G formula based on SCr of 0.96 mg/dL).    No Known Allergies   Thank you for allowing pharmacy to be a part of this patient's care.  02/03/21 12/04/2020 8:37 PM

## 2020-12-04 NOTE — Progress Notes (Signed)
Inpatient Diabetes Program Recommendations  AACE/ADA: New Consensus Statement on Inpatient Glycemic Control (2015)  Target Ranges:  Prepandial:   less than 140 mg/dL      Peak postprandial:   less than 180 mg/dL (1-2 hours)      Critically ill patients:  140 - 180 mg/dL   Lab Results  Component Value Date   GLUCAP 130 (H) 12/04/2020   HGBA1C 11.6 (H) 11/19/2019    Review of Glycemic Control  Diabetes history: DM1 Outpatient Diabetes medications: Lantus 45 units QHS, Novolog 12-20 units TID Current orders for Inpatient glycemic control: Lantus 15 units QD, Novolog 0-9 units TID with meals and 0-5 HS  HgbA1C - 11.6% CL diet ordered  Inpatient Diabetes Program Recommendations:     Will need meal coverage insulin since Type 1 and makes no insulin Novolog 5 units TID with meals if eating > 50%. Increase Lantus to 30 units QD  Spoke with pt at bedside in ICU. Pt seemed anxious about being in the hospital. States "I'm a healthy person and I shouldn't be here. It was traumatic last year when I was in the hospital for 8 days." Pt states he did not miss any insulin prior to admission and said his blood sugars have always run high. Missed appt with Endo although said his doctor is at [facility]. Has not seen in about a year. Has no insurance. Said he has applied for every program out there and has never gotten anything. We discussed HgbA1C of 11.6% and importance of improving to reduce risks of complications. Pt states "his stomach is messed up" and will not give injections in belly. Gives in arms, buttocks and legs. States he eats healthy most of the time. Very guarded with any information about his diabetes control. Did not have any questions, except for "when can I go?"  Will need appt with [facility] and TOC consult for assistance in obtaining an Endo. Will follow trends while inpatient.   Thank you. Ailene Ards, RD, LDN, CDE Inpatient Diabetes Coordinator [phone]

## 2020-12-04 NOTE — Progress Notes (Signed)
PROGRESS NOTE  Joseph Barr EHM:[id number] DOB: Aug 19, 1996 DOA: 12/03/2020 PCP: Claiborne Rigg, NP  Brief History   25 year old male PMH diabetes mellitus type 1 presented with nausea, vomiting and diarrhea.  Admitted for DKA.  A & P  DKA, diabetes mellitus type 1, no clear precipitating factors, patient reports compliance with medication.   --Some vomiting and diarrhea only localizing symptoms.  Abdominal exam benign.  Abdominal ultrasound unremarkable, chest x-ray and abdominal film unremarkable. --DKA corrected with standard therapy.  Transition to long-acting subcutaneous insulin and sliding scale.  IV fluids.  Advance diet as tolerated.    SIRS. --No signs or symptoms to suggest bacterial infection.  Procalcitonin elevated of unclear significance.  Lactic acid trending down, probably secondary to severe volume depletion, leukocytosis probably from hemoconcentration.  Chest x-ray negative, no other localizing symptoms and GI. --In the absence of any compelling evidence of bacterial infection we will hold antibiotics and monitor  AKI secondary to DKA, vomiting, GI losses. --Resolved with IV fluids.  Chest pain, likely musculoskeletal.  Was reproducible on exam on admission.  Present for about 3 days.  Troponins negative.  EKG sinus tachycardia, early repol. --Monitor clinically.      Disposition Plan:  Discussion: Remains ill with nausea and vomiting but DKA has resolved.  Antiemetics, IV fluids, subcutaneous insulin and monitor closely.  Advance diet as tolerated.  Status is: Inpatient  Remains inpatient appropriate because:IV treatments appropriate due to intensity of illness or inability to take PO and Inpatient level of care appropriate due to severity of illness   Dispo: The patient is from: Home              Anticipated d/c is to: Home              Patient currently is not medically stable to d/c.   Difficult to place patient No  DVT prophylaxis: enoxaparin (LOVENOX)  injection 40 mg Start: 12/03/20 2200   Code Status: Full Code Level of care: Stepdown Family Communication: none  Brendia Sacks, MD  [facility] Direct contact: see [email or web address] (further directions at bottom of note if needed) 7PM-7AM contact night coverage as at bottom of note 12/04/2020, 2:42 PM  LOS: 1 day   Significant Hospital Events   . 4/4 admitted for DKA   Consults:  . None   Procedures:  . None  Significant Diagnostic Tests:  . Abdominal x-ray no acute abnormalities . Chest x-ray no acute abnormalities   Micro Data:  . Blood cultures pending . Covid negative   Antimicrobials:  . Cefepime discontinued . Metronidazole discontinued . Vancomycin discontinued  Interval History/Subjective  CC: f/u DKA  No chest pain.  No abdominal pain.  Still having some nausea and vomiting.  Breathing okay.  Objective   Vitals:  Vitals:   12/04/20 1000 12/04/20 1200  BP: (!) 116/57   Pulse: (!) 116   Resp: 14   Temp:  98.4 F (36.9 C)  SpO2: 98%     Exam:  Constitutional:   . Appears calm, mildly uncomfortable, nontoxic but slightly ill ENMT:  . grossly normal hearing  Respiratory:  . CTA bilaterally, no w/r/r.  . Respiratory effort normal.  Cardiovascular:  . RRR, no m/r/g . No LE extremity edema   Abdomen:  . Abdomen appears normal; no tenderness or masses Musculoskeletal:  . RUE, LUE, RLE, LLE   o strength and tone grossly normal, no atrophy, no abnormal movements o No tenderness, masses Skin:  . No rashes, lesions,  ulcers noted Psychiatric:  . Mental status o Mood, affect appropriate  I have personally reviewed the following:   Today's Data  . CBG stable . Potassium 3.3, BUN and creatinine within normal limits now.  Phosphorus slightly low at 2.3.  Magnesium within normal limits.  Anion gap 8.  Repeat troponin negative. . Lactic acid this a.m. trended down. . WBC down to 21 today  Scheduled Meds: . Chlorhexidine Gluconate Cloth  6 each  Topical Daily  . enoxaparin (LOVENOX) injection  40 mg Subcutaneous Q24H  . insulin aspart  0-5 Units Subcutaneous QHS  . insulin aspart  0-9 Units Subcutaneous TID WC  . insulin glargine  15 Units Subcutaneous Daily  . mouth rinse  15 mL Mouth Rinse BID   Continuous Infusions: . sodium chloride    . dextrose 5% lactated ringers 999 mL/hr at 12/04/20 0056  . lactated ringers Stopped (12/03/20 2105)  . potassium chloride 10 mEq (12/04/20 1413)  . potassium PHOSPHATE IVPB (in mmol)      Principal Problem:   DKA, type 1 (HCC) Active Problems:   AKI (acute kidney injury) (HCC)   SIRS (systemic inflammatory response syndrome) (HCC)   Pseudohyponatremia   Hypochloremia   LOS: 1 day   How to contact the [facility] Attending or Consulting provider 7A - 7P or covering provider during after hours 7P -7A, for this patient?  1. Check the care team in [facility] and look for a) attending/consulting [facility] provider listed and b) the [facility] team listed 2. Log into [email or web address] and use 's universal password to access. If you do not have the password, please contact the hospital operator. 3. Locate the [facility] provider you are looking for under [facility] and page to a number that you can be directly reached. 4. If you still have difficulty reaching the provider, please page the [employer] [facility] (Director on Call) for the Hospitalists listed on amion for assistance.

## 2020-12-04 NOTE — Hospital Course (Signed)
25 year old male PMH diabetes mellitus type 1 presented with nausea, vomiting and diarrhea.  Admitted for DKA.  A & P  DKA, diabetes mellitus type 1, no clear precipitating factors, patient reports compliance with medication.   --Some vomiting and diarrhea only localizing symptoms.  Abdominal exam benign.  Abdominal ultrasound unremarkable, chest x-ray and abdominal film unremarkable. --DKA corrected with standard therapy.  Transition to long-acting subcutaneous insulin and sliding scale.  IV fluids.  Advance diet as tolerated.    SIRS. --No signs or symptoms to suggest bacterial infection.  Procalcitonin elevated of unclear significance.  Lactic acid trending down, probably secondary to severe volume depletion, leukocytosis probably from hemoconcentration.  Chest x-ray negative, no other localizing symptoms and GI. --In the absence of any compelling evidence of bacterial infection we will hold antibiotics and monitor  AKI secondary to DKA, vomiting, GI losses. --Resolved with IV fluids.  Chest pain, likely musculoskeletal.  Was reproducible on exam on admission.  Present for about 3 days.  Troponins negative.  EKG sinus tachycardia, early repol. --Monitor clinically.

## 2020-12-04 NOTE — Progress Notes (Signed)
Initial Nutrition Assessment  RD working remotely.  DOCUMENTATION CODES:   Underweight  INTERVENTION:  - diet advancement as medically feasible. - will order appropriate supplements following diet advancement. - will monitor for need for diet education prior to d/c. - complete NFPE when feasible.   NUTRITION DIAGNOSIS:   Inadequate oral intake related to inability to eat as evidenced by NPO status.  GOAL:   Patient will meet greater than or equal to 90% of their needs  MONITOR:   Diet advancement,Labs,Weight trends  REASON FOR ASSESSMENT:   Malnutrition Screening Tool  ASSESSMENT:   25 y.o. male with medical history of type 1 DM (dx at 25 years old), DKA, HLD, and GERD. He presented to the ED with complaints of N/V/D without abdominal pain which started the day prior to presentation and R-sided stabbing chest pain which began 3 days prior.  In the ED, he was hypotensive with blood pressure 79/49 and glucose 935 mg/dl. He was given 2 L IV fluids in the ED. He reports that CBG checks at home are always <200 mg/dl and that he does not miss insulin doses.  Diet advanced from NPO to CLD today at 0850 and then changed back to NPO at 1011; no intake documented during the time that he was on CLD.   He has not been seen by a [place] RD at any time in the past.   Weight today is 128 lb and weight on 12/01/19 was 132 lb. This indicates 4 lb weight loss in 1 year. No information documented in the edema section of flow sheet.   Per notes: - DKA - SIRS vs severe sepsis - AKI   Labs reviewed; CBGs: 194, 173, 160, 160, 150, 159 mg/dl, K: 3.3 mmol/l, Ca: 8.1 mg/dl, Phos: 2.3 mg/dl. Medications reviewed; sliding scale novolog, 15 units lantus/day, 10 mEq IV KCl x4 runs 4/5, 20 mmol IV KPhos x1 run 4/5. IVF; D5-LR @ 125 ml/hr (510 kcal/24 hours).    NUTRITION - FOCUSED PHYSICAL EXAM:  unable to complete at this time.   Diet Order:   Diet Order            Diet NPO time  specified Except for: Sips with Meds, Ice Chips  Diet effective now                 EDUCATION NEEDS:   Not appropriate for education at this time  Skin:  Skin Assessment: Reviewed RN Assessment  Last BM:  PTA/unknown  Height:   Ht Readings from Last 1 Encounters:  12/04/20 6' (1.829 m)    Weight:   Wt Readings from Last 1 Encounters:  12/04/20 58 kg     Estimated Nutritional Needs:  Kcal:  0160-1093 kcal Protein:  95-105 grams Fluid:  >/= 2.5 L/day      Trenton Gammon, MS, RD, LDN, CNSC Inpatient Clinical Dietitian RD pager # available in AMION  After hours/weekend pager # available in [facility]

## 2020-12-05 LAB — COMPREHENSIVE METABOLIC PANEL
ALT: 14 U/L (ref 0–44)
AST: 16 U/L (ref 15–41)
Albumin: 2.8 g/dL — ABNORMAL LOW (ref 3.5–5.0)
Alkaline Phosphatase: 66 U/L (ref 38–126)
Anion gap: 7 (ref 5–15)
BUN: 6 mg/dL (ref 6–20)
CO2: 28 mmol/L (ref 22–32)
Calcium: 7.5 mg/dL — ABNORMAL LOW (ref 8.9–10.3)
Chloride: 107 mmol/L (ref 98–111)
Creatinine, Ser: 0.74 mg/dL (ref 0.61–1.24)
GFR, Estimated: >60 mL/min (ref >60)
Glucose, Bld: 205 mg/dL — ABNORMAL HIGH (ref 70–99)
Potassium: 3.5 mmol/L (ref 3.5–5.1)
Sodium: 142 mmol/L (ref 135–145)
Total Bilirubin: 1 mg/dL (ref 0.3–1.2)
Total Protein: 5.3 g/dL — ABNORMAL LOW (ref 6.5–8.1)

## 2020-12-05 LAB — CBC
HCT: 29.8 % — ABNORMAL LOW (ref 39.0–52.0)
Hemoglobin: 10.5 g/dL — ABNORMAL LOW (ref 13.0–17.0)
MCH: 29.8 pg (ref 26.0–34.0)
MCHC: 35.2 g/dL (ref 30.0–36.0)
MCV: 84.7 fL (ref 80.0–100.0)
Platelets: 238 10*3/uL (ref 150–400)
RBC: 3.52 MIL/uL — ABNORMAL LOW (ref 4.22–5.81)
RDW: 12.4 % (ref 11.5–15.5)
WBC: 14.7 10*3/uL — ABNORMAL HIGH (ref 4.0–10.5)
nRBC: 0 % (ref 0.0–0.2)

## 2020-12-05 LAB — GLUCOSE, CAPILLARY
Glucose-Capillary: 251 mg/dL — ABNORMAL HIGH (ref 70–99)
Glucose-Capillary: 173 mg/dL — ABNORMAL HIGH (ref 70–99)
Glucose-Capillary: 218 mg/dL — ABNORMAL HIGH (ref 70–99)
Glucose-Capillary: 158 mg/dL — ABNORMAL HIGH (ref 70–99)

## 2020-12-05 LAB — HEMOGLOBIN A1C
Hgb A1c MFr Bld: 14.1 % — ABNORMAL HIGH (ref 4.8–5.6)
Mean Plasma Glucose: 358 mg/dL

## 2020-12-05 LAB — PROCALCITONIN: Procalcitonin: 14.94 ng/mL

## 2020-12-05 LAB — LACTIC ACID, PLASMA: Lactic Acid, Venous: 1.1 mmol/L (ref 0.5–1.9)

## 2020-12-05 MED ORDER — INSULIN GLARGINE 100 UNIT/ML ~~LOC~~ SOLN
15.0000 [IU] | Freq: Two times a day (BID) | SUBCUTANEOUS | Status: DC
  Administered 2020-12-05 – 2020-12-06 (×4): 15 [IU] via SUBCUTANEOUS
  Filled 2020-12-05 (×5): qty 0.15

## 2020-12-05 MED ORDER — ONDANSETRON HCL 4 MG/2ML IJ SOLN
4.0000 mg | Freq: Four times a day (QID) | INTRAMUSCULAR | Status: AC | PRN
  Administered 2020-12-05 – 2020-12-06 (×2): 4 mg via INTRAVENOUS
  Filled 2020-12-05 (×2): qty 2

## 2020-12-05 NOTE — Progress Notes (Signed)
[facility] PROGRESS NOTE    Progress Note  Joseph Barr  UDJ:[id number] DOB: 07/14/1996 DOA: 12/03/2020 PCP: Claiborne Rigg, NP     Brief Narrative:   Joseph Barr is an 25 y.o. male past medical history of type 1 diabetes mellitus, comes in with DKA.   Antibiotics: None  Microbiology data: Blood culture:  Procedures: None  Assessment/Plan:   Type 1 diabetes mellitus/DKA: No precipitating factors. He relates compliant with his medication. Abdominal sign is benign. Chest x-ray and abdominal film are unremarkable. Currently on long-acting insulin for sliding scale tolerating diet.  SIRS: No clear source of infectious etiology. Guarded on Vanco and cefepime on 12/04/2019, as he had a fever white count of 21, lactic acid of 3.5 procalcitonin of 20 (lactic acidosis is improved procalcitonin is 14). Chest x-ray and abdominal x-ray have been unremarkable. Abdominal x-ray showed no stones or acute findings. He relates a productive cough x-ray is unremarkable blood pleuritic chest pain continue empiric IV Vanco and cefepime.  Continue Flagyl.  Acute kidney injury secondary to DKA and GI losses: Resolved with IV fluids.  Chest pain likely musculoskeletal: Tropinns negative EKG unremarkable. He relates a pretty good history of pleuritic chest pain.  DVT prophylaxis: lovenox Family Communication:none Status is: Inpatient  Remains inpatient appropriate because:Hemodynamically unstable   Dispo: The patient is from: Home              Anticipated d/c is to: Home              Patient currently is not medically stable to d/c.   Difficult to place patient No  Code Status:     Code Status Orders  (From admission, onward)         Start     Ordered   12/03/20 2007  Full code  Continuous        12/03/20 2011        Code Status History    Date Active Date Inactive Code Status Order ID Comments User Context   11/19/2019 0816 11/22/2019 2328 Full Code [id number]   Briscoe Deutscher, MD ED   06/11/2017 2107 06/12/2017 2024 Full Code [id number]  Marguerita Merles [place], [place] Inpatient   04/24/2015 1855 04/25/2015 0149 Full Code [id number]  Casey Burkitt, MD Inpatient   Advance Care Planning Activity        IV Access:    Peripheral IV   Procedures and diagnostic studies:   DG Chest Port 1 View  Result Date: 12/03/2020 CLINICAL DATA:  Chest pain EXAM: PORTABLE CHEST 1 VIEW COMPARISON:  11/19/2019 FINDINGS: The heart size and mediastinal contours are within normal limits. Both lungs are clear. The visualized skeletal structures are unremarkable. IMPRESSION: Normal study. Electronically Signed   By: Charlett Nose M.D.   On: 12/03/2020 18:07   DG Abd Portable 1V  Result Date: 12/04/2020 CLINICAL DATA:  Diabetes. EXAM: PORTABLE ABDOMEN - 1 VIEW COMPARISON:  CT 11/19/2019. FINDINGS: Soft tissue structures are unremarkable. Gas pattern is nonspecific. No free air. No acute bony abnormality. Pelvic calcification consistent with phlebolith. No acute bony abnormality. IMPRESSION: No acute abnormality. Electronically Signed   By: Maisie Fus  Register   On: 12/04/2020 12:55   US Abdomen Limited RUQ (LIVER/GB)  Result Date: 12/03/2020 CLINICAL DATA:  Nausea and vomiting EXAM: ULTRASOUND ABDOMEN LIMITED RIGHT UPPER QUADRANT COMPARISON:  None. FINDINGS: Gallbladder: No gallstones or wall thickening visualized. No sonographic Murphy sign noted by sonographer. Common bile duct: Diameter: 1.6 mm Liver: No focal  lesion identified. Within normal limits in parenchymal echogenicity. Portal vein is patent on color Doppler imaging with normal direction of blood flow towards the liver. Other: None. IMPRESSION: Normal examination Electronically Signed   By: Jonna Clark M.D.   On: 12/03/2020 22:45     Medical Consultants:    None.   Subjective:    Joseph Barr he relates no further shortness of breath but is having a productive cough pleuritic chest pain is  improved.  Objective:    Vitals:   12/05/20 0530 12/05/20 0545 12/05/20 0600 12/05/20 0615  BP:   115/65   Pulse: 97 100 (!) 101 (!) 102  Resp: (!) 22 16 17 15   Temp:   98 F (36.7 C)   TempSrc:   Oral   SpO2: 96% 97% 97% 96%  Weight:      Height:       SpO2: 96 %   Intake/Output Summary (Last 24 hours) at 12/05/2020 0732 Last data filed at 12/05/2020 0700 Gross per 24 hour  Intake 5719.7 ml  Output 2775 ml  Net 2944.7 ml   Filed Weights   12/03/20 1854 12/04/20 0336  Weight: 70.3 kg 58 kg    Exam: General exam: In no acute distress. Respiratory system: Good air movement and crackles on the right lung not clear with coughing. Cardiovascular system: S1 & S2 heard, RRR. No JVD. Gastrointestinal system: Abdomen is nondistended, soft and nontender.  Extremities: No pedal edema. Skin: No rashes, lesions or ulcers Psychiatry: Judgement and insight appear normal. Mood & affect appropriate.    Data Reviewed:    Labs: Basic Metabolic Panel: Recent Labs  Lab 12/03/20 1743 12/03/20 2210 12/04/20 0253 12/04/20 1057 12/05/20 0233  NA 128* 125* 140 145 142  K 3.6 3.6 3.3* 3.3* 3.5  CL 86* 85* 106 106 107  CO2 12* 12* 25 31 28   GLUCOSE 935* 920* 246* 143* 205*  BUN 32* 32* 21* 17 6  CREATININE 2.65* 2.43* 1.20 0.96 0.74  CALCIUM 9.3 9.3 8.5* 8.1* 7.5*  MG  --   --   --  1.7  --   PHOS  --   --   --  2.3*  --    GFR Estimated Creatinine Clearance: 115.8 mL/min (by C-G formula based on SCr of 0.74 mg/dL). Liver Function Tests: Recent Labs  Lab 12/03/20 1743 12/05/20 0233  AST 23 16  ALT 25 14  ALKPHOS 122 66  BILITOT 1.8* 1.0  PROT 8.5* 5.3*  ALBUMIN 4.7 2.8*   No results for input(s): LIPASE, AMYLASE in the last 168 hours. No results for input(s): AMMONIA in the last 168 hours. Coagulation profile No results for input(s): INR, PROTIME in the last 168 hours. COVID-19 Labs  No results for input(s): DDIMER, FERRITIN, LDH, CRP in the last 72 hours.  Lab  Results  Component Value Date   SARSCOV2NAA NEGATIVE 12/03/2020   SARSCOV2NAA NEGATIVE 11/19/2019    CBC: Recent Labs  Lab 12/03/20 1743 12/04/20 0253 12/05/20 0233  WBC 33.1* 21.7* 14.7*  NEUTROABS 26.8*  --   --   HGB 15.6 12.8* 10.5*  HCT 46.5 35.4* 29.8*  MCV 88.9 81.8 84.7  PLT 424* 333 238   Cardiac Enzymes: No results for input(s): CKTOTAL, CKMB, CKMBINDEX, TROPONINI in the last 168 hours. BNP (last 3 results) No results for input(s): PROBNP in the last 8760 hours. CBG: Recent Labs  Lab 12/04/20 0749 12/04/20 0953 12/04/20 1158 12/04/20 1640 12/04/20 2114  GLUCAP 160* 150*  159* 130* 225*   D-Dimer: No results for input(s): DDIMER in the last 72 hours. Hgb A1c: No results for input(s): HGBA1C in the last 72 hours. Lipid Profile: No results for input(s): CHOL, HDL, LDLCALC, TRIG, CHOLHDL, LDLDIRECT in the last 72 hours. Thyroid function studies: No results for input(s): TSH, T4TOTAL, T3FREE, THYROIDAB in the last 72 hours.  Invalid input(s): FREET3 Anemia work up: No results for input(s): VITAMINB12, FOLATE, FERRITIN, TIBC, IRON, RETICCTPCT in the last 72 hours. Sepsis Labs: Recent Labs  Lab 12/03/20 1743 12/03/20 2210 12/04/20 0253 12/04/20 2053 12/04/20 2251 12/05/20 0233  PROCALCITON  --  46.24  --  19.42  --  14.94  WBC 33.1*  --  21.7*  --   --  14.7*  LATICACIDVEN 4.2* 4.1* 2.9* 1.1 3.5* 1.1   Microbiology Recent Results (from the past 240 hour(s))  Resp Panel by RT-PCR (Flu A&B, Covid) Nasopharyngeal Swab     Status: None   Collection Time: 12/03/20  5:43 PM   Specimen: Nasopharyngeal Swab; Nasopharyngeal(NP) swabs in vial transport medium  Result Value Ref Range Status   SARS Coronavirus 2 by RT PCR NEGATIVE NEGATIVE Final    Comment: (NOTE) SARS-CoV-2 target nucleic acids are NOT DETECTED.  The SARS-CoV-2 RNA is generally detectable in upper respiratory specimens during the acute phase of infection. The lowest concentration of  SARS-CoV-2 viral copies this assay can detect is 138 copies/mL. A negative result does not preclude SARS-Cov-2 infection and should not be used as the sole basis for treatment or other patient management decisions. A negative result may occur with  improper specimen collection/handling, submission of specimen other than nasopharyngeal swab, presence of viral mutation(s) within the areas targeted by this assay, and inadequate number of viral copies(<138 copies/mL). A negative result must be combined with clinical observations, patient history, and epidemiological information. The expected result is Negative.  Fact Sheet for Patients:  BloggerCourse.comhttps://www.fda.gov/media/152166/download  Fact Sheet for Healthcare Providers:  SeriousBroker.ithttps://www.fda.gov/media/152162/download  This test is no t yet approved or cleared by the [place] FDA and  has been authorized for detection and/or diagnosis of SARS-CoV-2 by FDA under an Emergency Use Authorization (EUA). This EUA will remain  in effect (meaning this test can be used) for the duration of the COVID-19 declaration under Section 564(b)(1) of the Act, 21 U.S.C.section 360bbb-3(b)(1), unless the authorization is terminated  or revoked sooner.       Influenza A by PCR NEGATIVE NEGATIVE Final   Influenza B by PCR NEGATIVE NEGATIVE Final    Comment: (NOTE) The Xpert Xpress SARS-CoV-2/FLU/RSV plus assay is intended as an aid in the diagnosis of influenza from Nasopharyngeal swab specimens and should not be used as a sole basis for treatment. Nasal washings and aspirates are unacceptable for Xpert Xpress SARS-CoV-2/FLU/RSV testing.  Fact Sheet for Patients: BloggerCourse.comhttps://www.fda.gov/media/152166/download  Fact Sheet for Healthcare Providers: SeriousBroker.ithttps://www.fda.gov/media/152162/download  This test is not yet approved or cleared by the [place] FDA and has been authorized for detection and/or diagnosis of SARS-CoV-2 by FDA under an Emergency Use  Authorization (EUA). This EUA will remain in effect (meaning this test can be used) for the duration of the COVID-19 declaration under Section 564(b)(1) of the Act, 21 U.S.C. section 360bbb-3(b)(1), unless the authorization is terminated or revoked.  Performed at [facility], [address] [place], [place] [id number]   MRSA PCR Screening     Status: None   Collection Time: 12/03/20  9:28 PM   Specimen: Nasal Mucosa; Nasopharyngeal  Result  Value Ref Range Status   MRSA by PCR NEGATIVE NEGATIVE Final    Comment:        The GeneXpert MRSA Assay (FDA approved for NASAL specimens only), is one component of a comprehensive MRSA colonization surveillance program. It is not intended to diagnose MRSA infection nor to guide or monitor treatment for MRSA infections. Performed at [facility], [address] [place], [place] [address]   Culture, blood (routine x 2)     Status: None (Preliminary result)   Collection Time: 12/03/20 10:10 PM   Specimen: BLOOD RIGHT HAND  Result Value Ref Range Status   Specimen Description   Final    BLOOD RIGHT HAND Performed at [facility], [address], [place], [place] [address]    Special Requests   Final    BOTTLES DRAWN AEROBIC ONLY Blood Culture adequate volume Performed at [facility], [address], [place], [place] [address]    Culture   Final    NO GROWTH < 12 HOURS Performed at [facility], [address], [place], [place] [address]    Report Status PENDING  Incomplete  Culture, blood (routine x 2)     Status: None (Preliminary result)   Collection Time: 12/03/20 10:10 PM   Specimen: BLOOD RIGHT ARM  Result Value Ref Range Status   Specimen Description   Final    BLOOD RIGHT ARM Performed at [facility], [address], [place], [place] [address]    Special Requests   Final    BOTTLES DRAWN AEROBIC ONLY Blood Culture adequate volume Performed at  [facility], [address] [place], [place] [address]    Culture   Final    NO GROWTH < 12 HOURS Performed at [facility], [address] [place], [place] [address]    Report Status PENDING  Incomplete     Medications:   . Chlorhexidine Gluconate Cloth  6 each Topical Daily  . enoxaparin (LOVENOX) injection  40 mg Subcutaneous Q24H  . insulin aspart  0-5 Units Subcutaneous QHS  . insulin aspart  0-9 Units Subcutaneous TID WC  . insulin glargine  15 Units Subcutaneous Daily  . mouth rinse  15 mL Mouth Rinse BID   Continuous Infusions: . sodium chloride 150 mL/hr at 12/05/20 0131  . ceFEPime (MAXIPIME) IV Stopped (12/05/20 4193)  . dextrose 5% lactated ringers 999 mL/hr at 12/04/20 0056  . metronidazole Stopped (12/05/20 7902)  . vancomycin Stopped (12/04/20 2323)      LOS: 2 days   Marinda Elk  [facility]  12/05/2020, 7:32 AM

## 2020-12-06 LAB — URINE CULTURE: Culture: NO GROWTH

## 2020-12-06 LAB — GLUCOSE, CAPILLARY
Glucose-Capillary: 95 mg/dL (ref 70–99)
Glucose-Capillary: 214 mg/dL — ABNORMAL HIGH (ref 70–99)
Glucose-Capillary: 53 mg/dL — ABNORMAL LOW (ref 70–99)
Glucose-Capillary: 84 mg/dL (ref 70–99)
Glucose-Capillary: 158 mg/dL — ABNORMAL HIGH (ref 70–99)

## 2020-12-06 LAB — PROCALCITONIN: Procalcitonin: 5.98 ng/mL

## 2020-12-06 MED ORDER — AMOXICILLIN-POT CLAVULANATE 875-125 MG PO TABS
1.0000 | ORAL_TABLET | Freq: Two times a day (BID) | ORAL | Status: DC
  Administered 2020-12-06 – 2020-12-07 (×3): 1 via ORAL
  Filled 2020-12-06 (×3): qty 1

## 2020-12-06 MED ORDER — AZITHROMYCIN 250 MG PO TABS
500.0000 mg | ORAL_TABLET | Freq: Every day | ORAL | Status: DC
  Administered 2020-12-06 – 2020-12-07 (×2): 500 mg via ORAL
  Filled 2020-12-06 (×2): qty 2

## 2020-12-06 NOTE — Progress Notes (Signed)
Inpatient Diabetes Program Recommendations  AACE/ADA: New Consensus Statement on Inpatient Glycemic Control (2015)  Target Ranges:  Prepandial:   less than 140 mg/dL      Peak postprandial:   less than 180 mg/dL (1-2 hours)      Critically ill patients:  140 - 180 mg/dL   Lab Results  Component Value Date   GLUCAP 95 12/06/2020   HGBA1C 14.1 (H) 12/03/2020    Review of Glycemic Control  Post-prandials elevated. Needs meal coverage insulin since Type 1  Current orders for Inpatient glycemic control: Lantus 15 units BID, Novolog 0-9 units TID with meals and 0-5 HS  Inpatient Diabetes Program Recommendations:     When diet advanced, add Novolog 5 units TID with meals if eating > 50% meal.  Continue to follow.  Thank you. Ailene Ards, RD, LDN, CDE Inpatient Diabetes Coordinator [phone]

## 2020-12-06 NOTE — Progress Notes (Signed)
[facility] PROGRESS NOTE    Progress Note  [name]  VZD:[id number] DOB: 1996-06-02 DOA: 12/03/2020 PCP: Claiborne Rigg, NP     Brief Narrative:   Joseph Barr is an 25 y.o. male past medical history of type 1 diabetes mellitus, comes in with DKA And a possible community-acquired pneumonia.Marland Kitchen   Antibiotics: None  Microbiology data: Blood culture:  Procedures: None  Assessment/Plan:   Type 1 diabetes mellitus/DKA: Now off IV insulin IV fluids. Blood glucose well controlled continue current regimen.  Sepsis possibly due to community-acquired pneumonia He did related productive cough and shortness of breath he had a productive cough while in the hospital. He started empirically on IV vancomycin and cefepime he has defervesced his white count has improved his procalcitonin is also improved. We will transition him to oral Augmentin and azithromycin. Abdominal x-ray unremarkable.  Acute kidney injury secondary to DKA and GI losses: In the setting of sepsis KVO IV fluids.  Creatinine at baseline.  Chest pain likely musculoskeletal: Tropinns negative EKG unremarkable. He relates a pretty good history of pleuritic chest pain.  DVT prophylaxis: lovenox Family Communication:none Status is: Inpatient  Remains inpatient appropriate because:Hemodynamically unstable   Dispo: The patient is from: Home              Anticipated d/c is to: Home              Patient currently is not medically stable to d/c.   Difficult to place patient No  Code Status:     Code Status Orders  (From admission, onward)         Start     Ordered   12/03/20 2007  Full code  Continuous        12/03/20 2011        Code Status History    Date Active Date Inactive Code Status Order ID Comments User Context   11/19/2019 0816 11/22/2019 2328 Full Code [id number]  Briscoe Deutscher, MD ED   06/11/2017 2107 06/12/2017 2024 Full Code [id number]  Marguerita Merles Kibler, [place] Inpatient    04/24/2015 1855 04/25/2015 0149 Full Code [id number]  Casey Burkitt, MD Inpatient   Advance Care Planning Activity        IV Access:    Peripheral IV   Procedures and diagnostic studies:   DG Abd Portable 1V  Result Date: 12/04/2020 CLINICAL DATA:  Diabetes. EXAM: PORTABLE ABDOMEN - 1 VIEW COMPARISON:  CT 11/19/2019. FINDINGS: Soft tissue structures are unremarkable. Gas pattern is nonspecific. No free air. No acute bony abnormality. Pelvic calcification consistent with phlebolith. No acute bony abnormality. IMPRESSION: No acute abnormality. Electronically Signed   By: Maisie Fus  Register   On: 12/04/2020 12:55     Medical Consultants:    None.   Subjective:    Joseph Barr relates he feels significantly better than yesterday, he also relates his shortness of breath is significantly improved compared to yesterday. Objective:    Vitals:   12/05/20 1423 12/05/20 1735 12/05/20 2144 12/06/20 0510  BP: 127/85 130/88 (!) 133/94 130/80  Pulse: 97 88 91 85  Resp: 14 14 16 16   Temp: 98.5 F (36.9 C) 99.1 F (37.3 C) 98.6 F (37 C) 98.6 F (37 C)  TempSrc: Oral Oral Oral Oral  SpO2: 99% 99% 98% 98%  Weight:      Height:       SpO2: 98 %  No intake or output data in the 24 hours ending 12/06/20 0943  Filed Weights   12/03/20 1854 12/04/20 0336  Weight: 70.3 kg 58 kg    Exam: General exam: In no acute distress. Respiratory system: Good air movement and crackles in the right lung Cardiovascular system: S1 & S2 heard, RRR. No JVD. Gastrointestinal system: Abdomen is nondistended, soft and nontender.  Extremities: No pedal edema. Skin: No rashes, lesions or ulcers Psychiatry: Judgement and insight appear normal. Mood & affect appropriate.   Data Reviewed:    Labs: Basic Metabolic Panel: Recent Labs  Lab 12/03/20 1743 12/03/20 2210 12/04/20 0253 12/04/20 1057 12/05/20 0233  NA 128* 125* 140 145 142  K 3.6 3.6 3.3* 3.3* 3.5  CL 86* 85* 106 106 107   CO2 12* 12* 25 31 28   GLUCOSE 935* 920* 246* 143* 205*  BUN 32* 32* 21* 17 6  CREATININE 2.65* 2.43* 1.20 0.96 0.74  CALCIUM 9.3 9.3 8.5* 8.1* 7.5*  MG  --   --   --  1.7  --   PHOS  --   --   --  2.3*  --    GFR Estimated Creatinine Clearance: 115.8 mL/min (by C-G formula based on SCr of 0.74 mg/dL). Liver Function Tests: Recent Labs  Lab 12/03/20 1743 12/05/20 0233  AST 23 16  ALT 25 14  ALKPHOS 122 66  BILITOT 1.8* 1.0  PROT 8.5* 5.3*  ALBUMIN 4.7 2.8*   No results for input(s): LIPASE, AMYLASE in the last 168 hours. No results for input(s): AMMONIA in the last 168 hours. Coagulation profile No results for input(s): INR, PROTIME in the last 168 hours. COVID-19 Labs  No results for input(s): DDIMER, FERRITIN, LDH, CRP in the last 72 hours.  Lab Results  Component Value Date   SARSCOV2NAA NEGATIVE 12/03/2020   SARSCOV2NAA NEGATIVE 11/19/2019    CBC: Recent Labs  Lab 12/03/20 1743 12/04/20 0253 12/05/20 0233  WBC 33.1* 21.7* 14.7*  NEUTROABS 26.8*  --   --   HGB 15.6 12.8* 10.5*  HCT 46.5 35.4* 29.8*  MCV 88.9 81.8 84.7  PLT 424* 333 238   Cardiac Enzymes: No results for input(s): CKTOTAL, CKMB, CKMBINDEX, TROPONINI in the last 168 hours. BNP (last 3 results) No results for input(s): PROBNP in the last 8760 hours. CBG: Recent Labs  Lab 12/05/20 0738 12/05/20 1123 12/05/20 1643 12/05/20 2146 12/06/20 0730  GLUCAP 251* 173* 218* 158* 95   D-Dimer: No results for input(s): DDIMER in the last 72 hours. Hgb A1c: Recent Labs    12/03/20 1756  HGBA1C 14.1*   Lipid Profile: No results for input(s): CHOL, HDL, LDLCALC, TRIG, CHOLHDL, LDLDIRECT in the last 72 hours. Thyroid function studies: No results for input(s): TSH, T4TOTAL, T3FREE, THYROIDAB in the last 72 hours.  Invalid input(s): FREET3 Anemia work up: No results for input(s): VITAMINB12, FOLATE, FERRITIN, TIBC, IRON, RETICCTPCT in the last 72 hours. Sepsis Labs: Recent Labs  Lab  12/03/20 1743 12/03/20 2210 12/04/20 0253 12/04/20 2053 12/04/20 2251 12/05/20 0233 12/06/20 0454  PROCALCITON  --  46.24  --  19.42  --  14.94 5.98  WBC 33.1*  --  21.7*  --   --  14.7*  --   LATICACIDVEN 4.2* 4.1* 2.9* 1.1 3.5* 1.1  --    Microbiology Recent Results (from the past 240 hour(s))  Resp Panel by RT-PCR (Flu A&B, Covid) Nasopharyngeal Swab     Status: None   Collection Time: 12/03/20  5:43 PM   Specimen: Nasopharyngeal Swab; Nasopharyngeal(NP) swabs in vial transport medium  Result  Value Ref Range Status   SARS Coronavirus 2 by RT PCR NEGATIVE NEGATIVE Final    Comment: (NOTE) SARS-CoV-2 target nucleic acids are NOT DETECTED.  The SARS-CoV-2 RNA is generally detectable in upper respiratory specimens during the acute phase of infection. The lowest concentration of SARS-CoV-2 viral copies this assay can detect is 138 copies/mL. A negative result does not preclude SARS-Cov-2 infection and should not be used as the sole basis for treatment or other patient management decisions. A negative result may occur with  improper specimen collection/handling, submission of specimen other than nasopharyngeal swab, presence of viral mutation(s) within the areas targeted by this assay, and inadequate number of viral copies(<138 copies/mL). A negative result must be combined with clinical observations, patient history, and epidemiological information. The expected result is Negative.  Fact Sheet for Patients:  BloggerCourse.com  Fact Sheet for Healthcare Providers:  SeriousBroker.it  This test is no t yet approved or cleared by the [place] FDA and  has been authorized for detection and/or diagnosis of SARS-CoV-2 by FDA under an Emergency Use Authorization (EUA). This EUA will remain  in effect (meaning this test can be used) for the duration of the COVID-19 declaration under Section 564(b)(1) of the Act, 21 U.S.C.section  360bbb-3(b)(1), unless the authorization is terminated  or revoked sooner.       Influenza A by PCR NEGATIVE NEGATIVE Final   Influenza B by PCR NEGATIVE NEGATIVE Final    Comment: (NOTE) The Xpert Xpress SARS-CoV-2/FLU/RSV plus assay is intended as an aid in the diagnosis of influenza from Nasopharyngeal swab specimens and should not be used as a sole basis for treatment. Nasal washings and aspirates are unacceptable for Xpert Xpress SARS-CoV-2/FLU/RSV testing.  Fact Sheet for Patients: BloggerCourse.com  Fact Sheet for Healthcare Providers: SeriousBroker.it  This test is not yet approved or cleared by the [place] FDA and has been authorized for detection and/or diagnosis of SARS-CoV-2 by FDA under an Emergency Use Authorization (EUA). This EUA will remain in effect (meaning this test can be used) for the duration of the COVID-19 declaration under Section 564(b)(1) of the Act, 21 U.S.C. section 360bbb-3(b)(1), unless the authorization is terminated or revoked.  Performed at [facility], [address], [place], [place] [address]   MRSA PCR Screening     Status: None   Collection Time: 12/03/20  9:28 PM   Specimen: Nasal Mucosa; Nasopharyngeal  Result Value Ref Range Status   MRSA by PCR NEGATIVE NEGATIVE Final    Comment:        The GeneXpert MRSA Assay (FDA approved for NASAL specimens only), is one component of a comprehensive MRSA colonization surveillance program. It is not intended to diagnose MRSA infection nor to guide or monitor treatment for MRSA infections. Performed at [facility], [address] [place], [place] [address]   Culture, blood (routine x 2)     Status: None (Preliminary result)   Collection Time: 12/03/20 10:10 PM   Specimen: BLOOD RIGHT HAND  Result Value Ref Range Status   Specimen Description   Final    BLOOD RIGHT HAND Performed at [facility], [address], [place], [place] [address]    Special Requests   Final    BOTTLES DRAWN AEROBIC ONLY Blood Culture adequate volume Performed at [facility], [address] [place], [place] [address]    Culture   Final    NO GROWTH 1 DAY Performed at [facility], [address] [place], [place] [address]    Report Status PENDING  Incomplete  Culture, blood (routine x 2)     Status: None (Preliminary result)   Collection Time: 12/03/20 10:10 PM   Specimen: BLOOD RIGHT ARM  Result Value Ref Range Status   Specimen Description   Final    BLOOD RIGHT ARM Performed at [facility], [address], [place], [place] [address]    Special Requests   Final    BOTTLES DRAWN AEROBIC ONLY Blood Culture adequate volume Performed at [facility], [address], [place], [place] [address]    Culture   Final    NO GROWTH 1 DAY Performed at [facility], [address] [place], [place] [address]    Report Status PENDING  Incomplete  Culture, Urine     Status: None   Collection Time: 12/04/20  9:30 PM   Specimen: Urine, Clean Catch  Result Value Ref Range Status   Specimen Description   Final    URINE, CLEAN CATCH Performed at [facility], [address], [place], [place] [address]    Special Requests   Final    NONE Performed at [facility], [address], [place], [place] [address]    Culture   Final    NO GROWTH Performed at [facility], [address], [place], [place] [address]    Report Status 12/06/2020 FINAL  Final     Medications:   . Chlorhexidine Gluconate Cloth  6 each Topical Daily  . enoxaparin (LOVENOX) injection  40 mg Subcutaneous Q24H  . insulin aspart  0-5 Units Subcutaneous QHS  . insulin aspart  0-9 Units Subcutaneous TID WC  . insulin glargine  15 Units Subcutaneous BID  . mouth rinse  15 mL Mouth Rinse BID   Continuous Infusions: .  sodium chloride 150 mL/hr at 12/05/20 0808  . ceFEPime (MAXIPIME) IV 2 g (12/06/20 0503)  . dextrose 5% lactated ringers 999 mL/hr at 12/04/20 0056  . vancomycin 750 mg (12/05/20 2120)      LOS: 3 days   Marinda Elk  [facility]  12/06/2020, 9:43 AM

## 2020-12-07 ENCOUNTER — Other Ambulatory Visit: Payer: Self-pay

## 2020-12-07 DIAGNOSIS — E1310 Other specified diabetes mellitus with ketoacidosis without coma: Secondary | ICD-10-CM

## 2020-12-07 DIAGNOSIS — A419 Sepsis, unspecified organism: Principal | ICD-10-CM

## 2020-12-07 DIAGNOSIS — E878 Other disorders of electrolyte and fluid balance, not elsewhere classified: Secondary | ICD-10-CM

## 2020-12-07 DIAGNOSIS — J189 Pneumonia, unspecified organism: Secondary | ICD-10-CM

## 2020-12-07 LAB — GLUCOSE, CAPILLARY
Glucose-Capillary: 42 mg/dL — CL (ref 70–99)
Glucose-Capillary: 66 mg/dL — ABNORMAL LOW (ref 70–99)
Glucose-Capillary: 87 mg/dL (ref 70–99)
Glucose-Capillary: 149 mg/dL — ABNORMAL HIGH (ref 70–99)
Glucose-Capillary: 119 mg/dL — ABNORMAL HIGH (ref 70–99)

## 2020-12-07 MED ORDER — INSULIN GLARGINE 100 UNIT/ML ~~LOC~~ SOLN
10.0000 [IU] | Freq: Two times a day (BID) | SUBCUTANEOUS | Status: DC
  Administered 2020-12-07: 10 [IU] via SUBCUTANEOUS
  Filled 2020-12-07: qty 0.1

## 2020-12-07 MED ORDER — AZITHROMYCIN 250 MG PO TABS
ORAL_TABLET | ORAL | 0 refills | Status: DC
Start: 2020-12-07 — End: 2020-12-21
  Filled 2020-12-07: qty 2, 2d supply, fill #0

## 2020-12-07 MED ORDER — AMOXICILLIN-POT CLAVULANATE 875-125 MG PO TABS
1.0000 | ORAL_TABLET | Freq: Two times a day (BID) | ORAL | 0 refills | Status: AC
  Filled 2020-12-07: qty 4, 2d supply, fill #0

## 2020-12-07 NOTE — Discharge Summary (Signed)
Physician Discharge Summary  Joseph Barr BUL:[id number] DOB: 03-23-1996 DOA: 12/03/2020  PCP: Claiborne Rigg, NP  Admit date: 12/03/2020 Discharge date: 12/07/2020  Admitted From: Home Disposition:  Home  Recommendations for Outpatient Follow-up:  1. Follow up with PCP in 1-2 weeks 2. Please obtain BMP/CBC in one week   Home Health:No Equipment/Devices:none  Discharge Condition:Stable CODE STATUS:Full Diet recommendation: Heart Healthy   Brief/Interim Summary: 25 y.o. male past medical history of type 1 diabetes mellitus, comes in with DKA And a possible community-acquired pneumonia.Marland Kitchen  Discharge Diagnoses:  Principal Problem:   DKA, type 1 (HCC) Active Problems:   AKI (acute kidney injury) (HCC)   SIRS (systemic inflammatory response syndrome) (HCC)   Pseudohyponatremia   Hypochloremia  Type 1 diabetes mellitus/DKA: He will start on IV insulin drip IV fluids his gap corrected, DKA resolved. He was changed to long-acting insulin plus sliding scale.  Blood glucose remained to be stable.  Sepsis possibly due to community-acquired pneumonia: He related productive cough shortness of breath with ambulation, while in the hospital he was having a productive cough. He was started on IV vancomycin and cefepime he defervesced. He had a significant leukocytosis tachycardic procalcitonin elevated with shortness of breath and cough. Once these improved. He was changed to oral Augmentin and azithromycin. He will continue these for 2 additional days and outpatient.  Acute kidney injury secondary to DKA and possibly sepsis: There is resolved with IV fluid hydration creatinine has returned to baseline.  Pleuritic chest pain: Likely due to pneumonia. Troponins negative, EKG unremarkable.  Discharge Instructions  Discharge Instructions    Diet - low sodium heart healthy   Complete by: As directed    Increase activity slowly   Complete by: As directed      Allergies as of  12/07/2020   No Known Allergies     Medication List    TAKE these medications   amoxicillin-clavulanate 875-125 MG tablet Commonly known as: AUGMENTIN Take 1 tablet by mouth every 12 (twelve) hours for 2 days.   atorvastatin 20 MG tablet Commonly known as: LIPITOR Take 1 tablet (20 mg total) by mouth daily.   azithromycin 250 MG tablet Commonly known as: ZITHROMAX Take 1 tab daily   glucose blood test strip Commonly known as: Accu-Chek Aviva 1 each by Other route 4 (four) times daily. And lancets 250.03   Lantus SoloStar 100 UNIT/ML Solostar Pen Generic drug: insulin glargine INJECT 30 UNITS INTO THE SKIN DAILY. What changed:   how much to take  when to take this  Another medication with the same name was removed. Continue taking this medication, and follow the directions you see here.   lisinopril 5 MG tablet Commonly known as: ZESTRIL Take 0.5 tablets (2.5 mg total) by mouth daily.   NovoLOG FlexPen 100 UNIT/ML FlexPen Generic drug: insulin aspart Inject 10 Units into the skin 3 (three) times daily with meals. What changed:   how much to take  additional instructions  Another medication with the same name was removed. Continue taking this medication, and follow the directions you see here.   pantoprazole 40 MG tablet Commonly known as: Protonix Take 1 tablet (40 mg total) by mouth daily. What changed: how much to take   Pen Needles 3/16" 31G X 5 MM Misc 100 Devices by Does not apply route in the morning, at noon, and at bedtime.       No Known Allergies  Consultations:  None   Procedures/Studies: DG Chest Port 1 [address]  Result Date: 12/03/2020 CLINICAL DATA:  Chest pain EXAM: PORTABLE CHEST 1 VIEW COMPARISON:  11/19/2019 FINDINGS: The heart size and mediastinal contours are within normal limits. Both lungs are clear. The visualized skeletal structures are unremarkable. IMPRESSION: Normal study. Electronically Signed   By: Charlett Nose M.D.   On:  12/03/2020 18:07   DG Abd Portable 1V  Result Date: 12/04/2020 CLINICAL DATA:  Diabetes. EXAM: PORTABLE ABDOMEN - 1 VIEW COMPARISON:  CT 11/19/2019. FINDINGS: Soft tissue structures are unremarkable. Gas pattern is nonspecific. No free air. No acute bony abnormality. Pelvic calcification consistent with phlebolith. No acute bony abnormality. IMPRESSION: No acute abnormality. Electronically Signed   By: Maisie Fus  Register   On: 12/04/2020 12:55   US Abdomen Limited RUQ (LIVER/GB)  Result Date: 12/03/2020 CLINICAL DATA:  Nausea and vomiting EXAM: ULTRASOUND ABDOMEN LIMITED RIGHT UPPER QUADRANT COMPARISON:  None. FINDINGS: Gallbladder: No gallstones or wall thickening visualized. No sonographic Murphy sign noted by sonographer. Common bile duct: Diameter: 1.6 mm Liver: No focal lesion identified. Within normal limits in parenchymal echogenicity. Portal vein is patent on color Doppler imaging with normal direction of blood flow towards the liver. Other: None. IMPRESSION: Normal examination Electronically Signed   By: Jonna Clark M.D.   On: 12/03/2020 22:45    Subjective: No complains  Discharge Exam: Vitals:   12/06/20 1956 12/07/20 0308  BP: (!) 141/91 118/75  Pulse: 93 90  Resp: 18 15  Temp: 98.3 F (36.8 C) 98.1 F (36.7 C)  SpO2: 100% 100%   Vitals:   12/06/20 0510 12/06/20 1350 12/06/20 1956 12/07/20 0308  BP: 130/80 122/79 (!) 141/91 118/75  Pulse: 85 85 93 90  Resp: Temp: 98.6 F (37 C) 98.7 F (37.1 C) 98.3 F (36.8 C) 98.1 F (36.7 C)  TempSrc: Oral Oral Oral Oral  SpO2: 98% 98% 100% 100%  Weight:      Height:        General: Pt is alert, awake, not in acute distress Cardiovascular: RRR, S1/S2 +, no rubs, no gallops Respiratory: CTA bilaterally, no wheezing, no rhonchi Abdominal: Soft, NT, ND, bowel sounds + Extremities: no edema, no cyanosis    The results of significant diagnostics from this hospitalization (including imaging, microbiology, ancillary  and laboratory) are listed below for reference.     Microbiology: Recent Results (from the past 240 hour(s))  Resp Panel by RT-PCR (Flu A&B, Covid) Nasopharyngeal Swab     Status: None   Collection Time: 12/03/20  5:43 PM   Specimen: Nasopharyngeal Swab; Nasopharyngeal(NP) swabs in vial transport medium  Result Value Ref Range Status   SARS Coronavirus 2 by RT PCR NEGATIVE NEGATIVE Final    Comment: (NOTE) SARS-CoV-2 target nucleic acids are NOT DETECTED.  The SARS-CoV-2 RNA is generally detectable in upper respiratory specimens during the acute phase of infection. The lowest concentration of SARS-CoV-2 viral copies this assay can detect is 138 copies/mL. A negative result does not preclude SARS-Cov-2 infection and should not be used as the sole basis for treatment or other patient management decisions. A negative result may occur with  improper specimen collection/handling, submission of specimen other than nasopharyngeal swab, presence of viral mutation(s) within the areas targeted by this assay, and inadequate number of viral copies(<138 copies/mL). A negative result must be combined with clinical observations, patient history, and epidemiological information. The expected result is Negative.  Fact Sheet for Patients:  BloggerCourse.com  Fact Sheet for Healthcare Providers:  SeriousBroker.it  This test is  no t yet approved or cleared by the [place] and  has been authorized for detection and/or diagnosis of SARS-CoV-2 by FDA under an Emergency Use Authorization (EUA). This EUA will remain  in effect (meaning this test can be used) for the duration of the COVID-19 declaration under Section 564(b)(1) of the Act, 21 U.S.C.section 360bbb-3(b)(1), unless the authorization is terminated  or revoked sooner.       Influenza A by PCR NEGATIVE NEGATIVE Final   Influenza B by PCR NEGATIVE NEGATIVE Final    Comment:  (NOTE) The Xpert Xpress SARS-CoV-2/FLU/RSV plus assay is intended as an aid in the diagnosis of influenza from Nasopharyngeal swab specimens and should not be used as a sole basis for treatment. Nasal washings and aspirates are unacceptable for Xpert Xpress SARS-CoV-2/FLU/RSV testing.  Fact Sheet for Patients: BloggerCourse.com  Fact Sheet for Healthcare Providers: SeriousBroker.it  This test is not yet approved or cleared by the [place] FDA and has been authorized for detection and/or diagnosis of SARS-CoV-2 by FDA under an Emergency Use Authorization (EUA). This EUA will remain in effect (meaning this test can be used) for the duration of the COVID-19 declaration under Section 564(b)(1) of the Act, 21 U.S.C. section 360bbb-3(b)(1), unless the authorization is terminated or revoked.  Performed at [facility], [address], [place], [place] [address]   MRSA PCR Screening     Status: None   Collection Time: 12/03/20  9:28 PM   Specimen: Nasal Mucosa; Nasopharyngeal  Result Value Ref Range Status   MRSA by PCR NEGATIVE NEGATIVE Final    Comment:        The GeneXpert MRSA Assay (FDA approved for NASAL specimens only), is one component of a comprehensive MRSA colonization surveillance program. It is not intended to diagnose MRSA infection nor to guide or monitor treatment for MRSA infections. Performed at [facility], [address] [place], [place] [address]   Culture, blood (routine x 2)     Status: None (Preliminary result)   Collection Time: 12/03/20 10:10 PM   Specimen: BLOOD RIGHT HAND  Result Value Ref Range Status   Specimen Description   Final    BLOOD RIGHT HAND Performed at [facility], [address], [place], [place] [address]    Special Requests   Final    BOTTLES DRAWN AEROBIC ONLY Blood Culture adequate volume Performed at [facility], [address] [place], [place] [address]    Culture   Final    NO GROWTH 2 DAYS Performed at [facility], [address], [place], [place] [address]    Report Status PENDING  Incomplete  Culture, blood (routine x 2)     Status: None (Preliminary result)   Collection Time: 12/03/20 10:10 PM   Specimen: BLOOD RIGHT ARM  Result Value Ref Range Status   Specimen Description   Final    BLOOD RIGHT ARM Performed at [facility], [address] [place], [place] [address]    Special Requests   Final    BOTTLES DRAWN AEROBIC ONLY Blood Culture adequate volume Performed at [facility], [address] [place], [place] [address]    Culture   Final    NO GROWTH 2 DAYS Performed at [facility], [address], [place], [place] [address]    Report Status PENDING  Incomplete  Culture, Urine     Status: None   Collection Time:  12/04/20  9:30 PM   Specimen: Urine, Clean Catch  Result Value Ref Range Status   Specimen Description   Final    URINE, CLEAN CATCH Performed at [facility], [address], [place], [place] [address]    Special Requests   Final    NONE Performed at [facility], [address] [place], [place] [address]    Culture   Final    NO GROWTH Performed at [facility], [address], [place], [place] [address]    Report Status 12/06/2020 FINAL  Final     Labs: BNP (last 3 results) No results for input(s): BNP in the last 8760 hours. Basic Metabolic Panel: Recent Labs  Lab 12/03/20 1743 12/03/20 2210 12/04/20 0253 12/04/20 1057 12/05/20 0233  NA 128* 125* 140 145 142  K 3.6 3.6 3.3* 3.3* 3.5  CL 86* 85* 106 106 107  CO2 12* 12* 25 31 28   GLUCOSE 935* 920* 246* 143* 205*  BUN 32* 32* 21* 17 6  CREATININE 2.65* 2.43* 1.20 0.96 0.74  CALCIUM 9.3 9.3 8.5* 8.1* 7.5*  MG  --   --   --  1.7  --   PHOS  --   --   --  2.3*  --    Liver Function Tests: Recent  Labs  Lab 12/03/20 1743 12/05/20 0233  AST 23 16  ALT 25 14  ALKPHOS 122 66  BILITOT 1.8* 1.0  PROT 8.5* 5.3*  ALBUMIN 4.7 2.8*   No results for input(s): LIPASE, AMYLASE in the last 168 hours. No results for input(s): AMMONIA in the last 168 hours. CBC: Recent Labs  Lab 12/03/20 1743 12/04/20 0253 12/05/20 0233  WBC 33.1* 21.7* 14.7*  NEUTROABS 26.8*  --   --   HGB 15.6 12.8* 10.5*  HCT 46.5 35.4* 29.8*  MCV 88.9 81.8 84.7  PLT 424* 333 238   Cardiac Enzymes: No results for input(s): CKTOTAL, CKMB, CKMBINDEX, TROPONINI in the last 168 hours. BNP: Invalid input(s): POCBNP CBG: Recent Labs  Lab 12/07/20 0121 12/07/20 0151 12/07/20 0231 12/07/20 0359 12/07/20 0738  GLUCAP 42* 66* 87 149* 119*   D-Dimer No results for input(s): DDIMER in the last 72 hours. Hgb A1c No results for input(s): HGBA1C in the last 72 hours. Lipid Profile No results for input(s): CHOL, HDL, LDLCALC, TRIG, CHOLHDL, LDLDIRECT in the last 72 hours. Thyroid function studies No results for input(s): TSH, T4TOTAL, T3FREE, THYROIDAB in the last 72 hours.  Invalid input(s): FREET3 Anemia work up No results for input(s): VITAMINB12, FOLATE, FERRITIN, TIBC, IRON, RETICCTPCT in the last 72 hours. Urinalysis    Component Value Date/Time   COLORURINE STRAW (A) 12/04/2020 2130   APPEARANCEUR CLEAR 12/04/2020 2130   LABSPEC 1.009 12/04/2020 2130   PHURINE 9.0 (H) 12/04/2020 2130   GLUCOSEU 150 (A) 12/04/2020 2130   HGBUR NEGATIVE 12/04/2020 2130   BILIRUBINUR NEGATIVE 12/04/2020 2130   BILIRUBINUR Negative 08/01/2016 0950   KETONESUR 20 (A) 12/04/2020 2130   PROTEINUR NEGATIVE 12/04/2020 2130   UROBILINOGEN 0.2 08/01/2016 0950   UROBILINOGEN 0.2 04/24/2015 1305   NITRITE NEGATIVE 12/04/2020 2130   LEUKOCYTESUR NEGATIVE 12/04/2020 2130   Sepsis Labs Invalid input(s): PROCALCITONIN,  WBC,  LACTICIDVEN Microbiology Recent Results (from the past 240 hour(s))  Resp Panel by RT-PCR (Flu A&B,  Covid) Nasopharyngeal Swab     Status: None   Collection Time: 12/03/20  5:43 PM   Specimen: Nasopharyngeal Swab; Nasopharyngeal(NP) swabs in vial  transport medium  Result Value Ref Range Status   SARS Coronavirus 2 by RT PCR NEGATIVE NEGATIVE Final    Comment: (NOTE) SARS-CoV-2 target nucleic acids are NOT DETECTED.  The SARS-CoV-2 RNA is generally detectable in upper respiratory specimens during the acute phase of infection. The lowest concentration of SARS-CoV-2 viral copies this assay can detect is 138 copies/mL. A negative result does not preclude SARS-Cov-2 infection and should not be used as the sole basis for treatment or other patient management decisions. A negative result may occur with  improper specimen collection/handling, submission of specimen other than nasopharyngeal swab, presence of viral mutation(s) within the areas targeted by this assay, and inadequate number of viral copies(<138 copies/mL). A negative result must be combined with clinical observations, patient history, and epidemiological information. The expected result is Negative.  Fact Sheet for Patients:  BloggerCourse.comhttps://www.fda.gov/media/152166/download  Fact Sheet for Healthcare Providers:  SeriousBroker.ithttps://www.fda.gov/media/152162/download  This test is no t yet approved or cleared by the [place] FDA and  has been authorized for detection and/or diagnosis of SARS-CoV-2 by FDA under an Emergency Use Authorization (EUA). This EUA will remain  in effect (meaning this test can be used) for the duration of the COVID-19 declaration under Section 564(b)(1) of the Act, 21 U.S.C.section 360bbb-3(b)(1), unless the authorization is terminated  or revoked sooner.       Influenza A by PCR NEGATIVE NEGATIVE Final   Influenza B by PCR NEGATIVE NEGATIVE Final    Comment: (NOTE) The Xpert Xpress SARS-CoV-2/FLU/RSV plus assay is intended as an aid in the diagnosis of influenza from Nasopharyngeal swab specimens and should  not be used as a sole basis for treatment. Nasal washings and aspirates are unacceptable for Xpert Xpress SARS-CoV-2/FLU/RSV testing.  Fact Sheet for Patients: BloggerCourse.comhttps://www.fda.gov/media/152166/download  Fact Sheet for Healthcare Providers: SeriousBroker.ithttps://www.fda.gov/media/152162/download  This test is not yet approved or cleared by the [place] FDA and has been authorized for detection and/or diagnosis of SARS-CoV-2 by FDA under an Emergency Use Authorization (EUA). This EUA will remain in effect (meaning this test can be used) for the duration of the COVID-19 declaration under Section 564(b)(1) of the Act, 21 U.S.C. section 360bbb-3(b)(1), unless the authorization is terminated or revoked.  Performed at [facility], [address] [address] [place], [place] [address]   MRSA PCR Screening     Status: None   Collection Time: 12/03/20  9:28 PM   Specimen: Nasal Mucosa; Nasopharyngeal  Result Value Ref Range Status   MRSA by PCR NEGATIVE NEGATIVE Final    Comment:        The GeneXpert MRSA Assay (FDA approved for NASAL specimens only), is one component of a comprehensive MRSA colonization surveillance program. It is not intended to diagnose MRSA infection nor to guide or monitor treatment for MRSA infections. Performed at [facility], [address] [place], [place] [address]   Culture, blood (routine x 2)     Status: None (Preliminary result)   Collection Time: 12/03/20 10:10 PM   Specimen: BLOOD RIGHT HAND  Result Value Ref Range Status   Specimen Description   Final    BLOOD RIGHT HAND Performed at [facility], [address] [place], [place] [address]    Special Requests   Final    BOTTLES DRAWN AEROBIC ONLY Blood Culture adequate volume Performed at [facility], [address] [place], [place] [address]    Culture   Final    NO GROWTH 2 DAYS Performed at  [facility], [address] [place],  [place], [place] [address]    Report Status PENDING  Incomplete  Culture, blood (routine x 2)     Status: None (Preliminary result)   Collection Time: 12/03/20 10:10 PM   Specimen: BLOOD RIGHT ARM  Result Value Ref Range Status   Specimen Description   Final    BLOOD RIGHT ARM Performed at [facility], [address], [place], [place] [address]    Special Requests   Final    BOTTLES DRAWN AEROBIC ONLY Blood Culture adequate volume Performed at [facility], [address] [place], [place] [address]    Culture   Final    NO GROWTH 2 DAYS Performed at [facility], [address], [place], [place] [address]    Report Status PENDING  Incomplete  Culture, Urine     Status: None   Collection Time: 12/04/20  9:30 PM   Specimen: Urine, Clean Catch  Result Value Ref Range Status   Specimen Description   Final    URINE, CLEAN CATCH Performed at [facility], [address] [place], [place] [address]    Special Requests   Final    NONE Performed at [facility], [address] [place], [place] [address]    Culture   Final    NO GROWTH Performed at [facility], [address] [place], [place] [address]    Report Status 12/06/2020 FINAL  Final     Time coordinating discharge: Over 30 minutes  SIGNED:   Marinda Elk, MD  [facility] 12/07/2020, 8:44 AM Pager   If 7PM-7AM, please contact night-coverage [email or web address] Password [email or web address]

## 2020-12-09 LAB — CULTURE, BLOOD (ROUTINE X 2)
Culture: NO GROWTH
Culture: NO GROWTH
Special Requests: ADEQUATE
Special Requests: ADEQUATE

## 2020-12-10 ENCOUNTER — Telehealth: Payer: Self-pay

## 2020-12-10 NOTE — Telephone Encounter (Signed)
Transition Care Management Unsuccessful Follow-up Telephone Call  Date of discharge and from where:  12/07/2020, [facility]   Attempts:  1st Attempt  Reason for unsuccessful TCM follow-up call:  Left voice message on # [phone].  Need to discus scheduling a follow up appointment with PCP

## 2020-12-11 ENCOUNTER — Telehealth: Payer: Self-pay

## 2020-12-11 NOTE — Telephone Encounter (Signed)
Transition Care Management Follow-up Telephone Call  Date of discharge and from where: 12/07/2020, [facility]   How have you been since you were released from the hospital? He said he is feeling " wonderful."  He has returned to work.   Any questions or concerns? No  Items Reviewed:  Did the pt receive and understand the discharge instructions provided? Yes   Medications obtained and verified? Yes  - he said he has all medications and has completed taking the antibiotics. No questions about the med regime,   Other? No   Any new allergies since your discharge? No   Dietary orders reviewed? No  Do you have support at home? not addressed  Home Care and Equipment/Supplies: Were home health services ordered? no If so, what is the name of the agency? n/a  Has the agency set up a time to come to the patient's home? not applicable Were any new equipment or medical supplies ordered?  No What is the name of the medical supply agency? n/a Were you able to get the supplies/equipment? not applicable Do you have any questions related to the use of the equipment or supplies? 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 Yes  - he was scheduled for follow up with Ricky Stabs, NP  - 12/21/2020 @ 1050. @ [facility]  The clinic information and appointment time  was text to him as he requested.   Specialist Hospital f/u appt confirmed? No    Are transportation arrangements needed? No   If their condition worsens, is the pt aware to call PCP or go to the Emergency Dept.? Yes  Was the patient provided with contact information for the PCP's office or ED? Yes  Was to pt encouraged to call back with questions or concerns? Yes

## 2020-12-19 NOTE — Progress Notes (Addendum)
TRANSITION OF CARE VISIT   Primary Care Physician (PCP): Geryl Rankins, NP                 [facility]  [address] [place], [address] Phone: [phone] Fax: [phone]   Date of Admission: 12/03/2020  Date of Discharge: 12/07/2020  Transitions of Care Call: Eden Lathe, RN called patient on 12/11/2020.  Discharged from: [facility]  Discharge Diagnosis: DKA, type 1, acute kidney injury, systemic inflammatory response syndrome, pseudohyponatremia, hypochloremia  Summary of Admission per MD note:  Type 1 diabetes mellitus/DKA: He will start on IV insulin drip IV fluids his gap corrected, DKA resolved. He was changed to long-acting insulin plus sliding scale. Blood glucose remained to be stable.  Sepsis possibly due to community-acquired pneumonia: He related productive cough shortness of breath with ambulation, while in the hospital he was having a productive cough. He was started on IV vancomycin and cefepime he defervesced. He had a significant leukocytosis tachycardic procalcitonin elevated with shortness of breath and cough. Once these improved. He was changed to oral Augmentin and azithromycin. He will continue these for 2 additional days and outpatient.  Acute kidney injury secondary to DKA and possibly sepsis: There is resolved with IV fluid hydration creatinine has returned to baseline.  Pleuritic chest pain: Likely due to pneumonia. Troponins negative, EKG unremarkable.  PREVIOUS PCP VISIT: 04/09/2020 per NP note: Not well controlled. Diagnosed with diabetes type 1 at the age of 64. Currently Taking novolog 10 units TID and lantus 30 units daily.  Checking blood glucose levels at least 4 times per day. Will start on renal dose ACE and STATIN. Denies symptoms of neuropathy at this time. LDL not at goal of <70.    TODAY's VISIT: Feeling  better. Reports at last visit with PCP instructed to increase Lantus 30 units in weekly increments of 2 with the maximum being 36 units if blood sugars remaining high. Currently taking Lantus 36 units nightly, Novolog sliding scale usually about 12 to 14 units with meals, Lisinopril, and Atorvastatin. Requesting medication refills. Finished Augmentin and Azithromycin. Denies shortness of breath, productive cough, and chest pain. Last A1C:  14.1% on 12/03/2020 Med Adherence:  [x]  Yes   Medication side effects:  []  Yes    [x]  No Home Monitoring?  [x]  Yes    []  No  Home glucose results range: Usually higher in the mornings and decreases steadily throughout the day. Usual day ranges 100's to 200's.  Diet Adherence: Making improvements. Does still have pasta. Eating more salads, fruit, and chicken. Exercise: [x]  Yes running and soccer   MEDICATIONS  Medication Reconciliation conducted with patient/caregiver? (Yes/ No):Yes  New medications prescribed/discontinued upon discharge? (Yes/No): Yes. Amoxicillin-Clavulanate, Azithromycin, Pantoprazole Sodium   Barriers identified related to medications: No  LABS  Lab Reviewed (Yes/No/NA): Yes  PHYSICAL EXAM:   Vitals with BMI 12/21/2020 12/07/2020 12/06/2020  Height - - -  Weight 130 lbs 6 oz - -  BMI 11.65 - -  Systolic 790 383 338  Diastolic 70 75 91  Pulse 97 90 93   General appearance - alert, well appearing, and in no distress and oriented to person, place, and time Mental status - alert, oriented to person, place, and time, normal mood, behavior, speech, dress, motor activity, and thought processes Eyes -  extraocular eye movements intact Neck - supple, no significant adenopathy Chest - clear to auscultation, no wheezes, rales or rhonchi, symmetric  air entry, no tachypnea, retractions or cyanosis Heart - normal rate, regular rhythm, normal S1, S2, no murmurs, rubs, clicks or gallops Neurological - alert, oriented, normal speech, no focal  findings or movement disorder noted  ASSESSMENT: 1. Hospital discharge follow-up: - Improved since hospital discharge.  2. Type 1 diabetes mellitus with ketoacidosis without coma ([place]): - Hemoglobin A1c last obtained 12/03/2020 not at goal at 14.1%, goal < 7%. This is increased from previous hemoglobin A1c of 11.6% on 11/19/2019. Next hemoglobin A1c due July 2022. - Continue Insulin Glargine and Insulin Aspart as prescribed.  - Continue Lisinopril and Atorvastatin as prescribed.  - To achieve an A1C goal of less than or equal to 7.0 percent, a fasting blood sugar of 80 to 130 mg/dL and a postprandial glucose (90 to 120 minutes after a meal) less than 180 mg/dL. In the event of sugars less than 60 mg/dl or greater than 400 mg/dl please notify the clinic ASAP. It is recommended that you undergo annual eye exams and annual foot exams. - Discussed the importance of healthy eating habits, low-carbohydrate diet, low-sugar diet, regular aerobic exercise (at least 150 minutes a week as tolerated) and medication compliance to achieve or maintain control of diabetes. - BMP to evaluate kidney function and electrolyte balance. - Referral to Endocrinology for further evaluation and management. - Follow-up with primary provider in 3 months or sooner if needed.  - Basic Metabolic Panel - CBC - Blood Glucose Monitoring Suppl (TRUE METRIX METER) w/Device KIT; Use as directed  Dispense: 1 kit; Refill: 0 - lisinopril (ZESTRIL) 5 MG tablet; Take 0.5 tablets (2.5 mg total) by mouth daily.  Dispense: 45 tablet; Refill: 0 - atorvastatin (LIPITOR) 20 MG tablet; Take 1 tablet (20 mg total) by mouth daily.  Dispense: 90 tablet; Refill: 0 - insulin aspart (NOVOLOG FLEXPEN) 100 UNIT/ML FlexPen; Inject 12-20 Units into the skin 3 (three) times daily with meals. Sliding scale  Dispense: 15 mL; Refill: 3 - insulin glargine (LANTUS SOLOSTAR) 100 UNIT/ML Solostar Pen; Inject 36 Units into the skin daily.  Dispense: 18 mL; Refill:  1 - Ambulatory referral to Endocrinology  3. Lipids abnormal: -Practice low-fat heart healthy diet and at least 150 minutes of moderate intensity exercise weekly as tolerated.  - Continue Atorvastatin as prescribed.  - Follow-up with primary provider as scheduled.  - atorvastatin (LIPITOR) 20 MG tablet; Take 1 tablet (20 mg total) by mouth daily.  Dispense: 90 tablet; Refill: 0  4. SIRS (systemic inflammatory response syndrome) ([place]): - BMP to evaluate kidney function and electrolyte balance. - CBC to screen for anemia. - Basic Metabolic Panel - CBC  5. Community acquired pneumonia, unspecified laterality: 6. Pleuritic chest pain: - No longer having shortness of breath, productive cough, and chest pain.  7. AKI (acute kidney injury) ([place]): 8. Pseudohyponatremia: 9. Hypochloremia: - BMP to evaluate kidney function and electrolyte balance. - Basic Metabolic Panel  PATIENT EDUCATION PROVIDED: See AVS   FOLLOW-UP (Include any further testing or referrals): Referral to Endocrinology. Follow-up with primary provider in 3 months or sooner if needed.

## 2020-12-21 ENCOUNTER — Other Ambulatory Visit: Payer: Self-pay

## 2020-12-21 ENCOUNTER — Ambulatory Visit (INDEPENDENT_AMBULATORY_CARE_PROVIDER_SITE_OTHER): Payer: Self-pay | Admitting: Family

## 2020-12-21 ENCOUNTER — Encounter: Payer: Self-pay | Admitting: Family

## 2020-12-21 VITALS — BP 103/70 | HR 97 | Wt 130.4 lb

## 2020-12-21 DIAGNOSIS — J189 Pneumonia, unspecified organism: Secondary | ICD-10-CM

## 2020-12-21 DIAGNOSIS — E1010 Type 1 diabetes mellitus with ketoacidosis without coma: Secondary | ICD-10-CM

## 2020-12-21 DIAGNOSIS — R0781 Pleurodynia: Secondary | ICD-10-CM

## 2020-12-21 DIAGNOSIS — N179 Acute kidney failure, unspecified: Secondary | ICD-10-CM

## 2020-12-21 DIAGNOSIS — R6510 Systemic inflammatory response syndrome (SIRS) of non-infectious origin without acute organ dysfunction: Secondary | ICD-10-CM

## 2020-12-21 DIAGNOSIS — E7889 Other lipoprotein metabolism disorders: Secondary | ICD-10-CM

## 2020-12-21 DIAGNOSIS — R7989 Other specified abnormal findings of blood chemistry: Secondary | ICD-10-CM

## 2020-12-21 DIAGNOSIS — E878 Other disorders of electrolyte and fluid balance, not elsewhere classified: Secondary | ICD-10-CM

## 2020-12-21 DIAGNOSIS — Z09 Encounter for follow-up examination after completed treatment for conditions other than malignant neoplasm: Secondary | ICD-10-CM

## 2020-12-21 MED ORDER — LISINOPRIL 5 MG PO TABS
2.5000 mg | ORAL_TABLET | Freq: Every day | ORAL | 0 refills | Status: DC
  Filled 2020-12-21: qty 15, 30d supply, fill #0

## 2020-12-21 MED ORDER — ATORVASTATIN CALCIUM 20 MG PO TABS
20.0000 mg | ORAL_TABLET | Freq: Every day | ORAL | 0 refills | Status: DC
  Filled 2020-12-21: qty 30, 30d supply, fill #0

## 2020-12-21 MED ORDER — LANTUS SOLOSTAR 100 UNIT/ML ~~LOC~~ SOPN
36.0000 [IU] | PEN_INJECTOR | Freq: Every day | SUBCUTANEOUS | 1 refills | Status: DC
  Filled 2020-12-21 – 2021-01-08 (×2): qty 18, 50d supply, fill #0
  Filled 2021-04-22: qty 18, 50d supply, fill #1

## 2020-12-21 MED ORDER — NOVOLOG FLEXPEN 100 UNIT/ML ~~LOC~~ SOPN
12.0000 [IU] | PEN_INJECTOR | Freq: Three times a day (TID) | SUBCUTANEOUS | 3 refills | Status: DC
  Filled 2020-12-21 – 2021-01-08 (×2): qty 15, 25d supply, fill #0
  Filled 2021-04-22: qty 15, 25d supply, fill #1
  Filled 2021-07-05: qty 15, 25d supply, fill #2
  Filled 2021-09-16 – 2021-09-23 (×2): qty 15, 25d supply, fill #0

## 2020-12-21 MED ORDER — TRUE METRIX METER W/DEVICE KIT
PACK | 0 refills | Status: DC
  Filled 2020-12-21 – 2021-01-08 (×2): qty 1, 365d supply, fill #0

## 2020-12-21 NOTE — Patient Instructions (Signed)
Type 1 Diabetes Mellitus, Self-Care, Adult When you have type 1 diabetes (type 1 diabetes mellitus), you must make sure your blood sugar (glucose) stays in a healthy range. You can do this with:  Insulin.  Healthy foods.  Exercise.  Lifestyle changes.  Other medicines, if needed.  Support from doctors and others. What are the risks? Having diabetes can put you at risk for heart disease and kidney disease. These problems can get worse if you do not take good care of yourself and keep your blood sugar in a healthy range. How to stay aware of blood sugar  Check your blood sugar every day, as often as told.  Have your A1C (hemoglobin A1C) level checked two or more times a year. Have it checked more often if your doctor tells you to.  Try to meet the treatment goals set by your doctor. Try to have these blood sugar levels: ? Before meals (preprandial): 80-130 mg/dL (4.4-7.2 mmol/L). ? After meals (postprandial): below 180 mg/dL (10 mmol/L). ? A1C level: less than 7%.   Follow these instructions at home: Medicines  Take over-the-counter and prescription medicines only as told by your doctor.  Take insulin and medicines every day as told.  Do not run out of insulin or medicines.  Change the amount of insulin you take based on how active you are and what foods you eat. Your doctor will tell you the amount to take. Eating and drinking  Choose healthy foods, such as: ? Low-fat (lean) proteins. ? Complex carbs (carbohydrates), such aswhole wheat. ? Fresh fruits and vegetables. ? Low-fat dairy products. ? Healthy fats.  Meet with a food expert (dietitian) to help you make an eating plan that is right for you. Be sure to: ? Follow instructions from your doctor about what you cannot eat or drink. ? Drink enough fluid to keep your pee (urine) pale yellow. ? Keep track of carbs that you eat. Read food labels and learn food serving sizes. ? Follow your sick-day plan when you cannot  eat or drink normally. Make this plan with your doctor so it is ready.  Have a 15-gram fast-acting carb snack with you at all times to treat low blood sugar.   Activity  Get regular exercise as told.  Spread out your exercise over 3 or more days a week.  Do not go more than 2 days in a row without exercise.  Talk with your doctor before you start a new exercise. Lifestyle  Do not use any products that contain nicotine or tobacco, such as cigarettes, e-cigarettes, and chewing tobacco. If you need help quitting, ask your doctor.  If your doctor says that alcohol is safe for you: ? Limit how much you use to:  0-1 drink a day for women who are not pregnant.  0-2 drinks a day for men. ? Be aware of how much alcohol is in your drink. In the [place] one drink equals one 12 oz bottle of beer (355 mL), one 5 oz glass of wine (148 mL), or one 1 oz glass of hard liquor (44 mL).  Learn to lower stress. If you need help, ask your doctor. Body care  Stay up to date with your shots (vaccinations) as told by your doctor. Get shots for: ? The flu. ? Pneumonia. ? Hepatitis B.  Have your eyes and feet checked by a doctor as often as told.  Check your skin and feet every day. Check for cuts, bruises, redness, blisters, or sores.  Brush your teeth and gums two times a day. Floss one or more times a day.  Go to the dentist one or more times every 6 months.  Stay at a healthy weight.   General instructions  Share your diabetes care plan with people at work, school, and home.  Make sure your family and close friends learn the symptoms of low blood sugar and can help treat you. You will need their help if you cannot treat yourself.  Check your pee for ketones when you are sick and as told.  Carry a card or wear jewelry that says you have diabetes.  Keep all follow-up visits as told by your doctor. This is important. Questions to ask your doctor  Do I need to meet with a diabetes  educator?  Where can I find a support group for people with diabetes?  Should I have a glucagon shot kit at home? Where to find more information  American Diabetes Association (ADA): diabetes.org  Association of Diabetes Care and Education Specialists (ADCES): diabeteseducator.org  International Diabetes Federation (IDF): https://www.munoz-bell.org/ Get help right away if:  Your blood sugar is below 54 mg/dL (3 mmol/L).  You have medium or large levels of ketones in your pee. These symptoms may be an emergency. Do not wait to see if the symptoms will go away. Get medical help right away. Call your local emergency services (911 in the [place] Do not drive yourself to the hospital. Summary  When you have type 1 diabetes, you must make sure your blood sugar (glucose) stays in a healthy range.  Diabetes can raise your risk for heart disease and kidney disease. These problems can get worse if you do not take good care of yourself and keep your blood sugar in a healthy range.  Check your blood sugar every day, as often as told.  Keep all follow-up visits as told by your doctor. This is important. This information is not intended to replace advice given to you by your health care provider. Make sure you discuss any questions you have with your health care provider. Document Revised: 10/06/2019 Document Reviewed: 10/06/2019 Elsevier Patient Education  2021 [employer].

## 2020-12-21 NOTE — Progress Notes (Signed)
HFU Needs new glucose monitor

## 2020-12-22 LAB — CBC
Hematocrit: 38.6 % (ref 37.5–51.0)
Hemoglobin: 12.9 g/dL — ABNORMAL LOW (ref 13.0–17.7)
MCH: 29.3 pg (ref 26.6–33.0)
MCHC: 33.4 g/dL (ref 31.5–35.7)
MCV: 88 fL (ref 79–97)
Platelets: 386 10*3/uL (ref 150–450)
RBC: 4.41 x10E6/uL (ref 4.14–5.80)
RDW: 12.9 % (ref 11.6–15.4)
WBC: 5.6 10*3/uL (ref 3.4–10.8)

## 2020-12-22 LAB — BASIC METABOLIC PANEL
BUN/Creatinine Ratio: 16 (ref 9–20)
BUN: 12 mg/dL (ref 6–20)
CO2: 21 mmol/L (ref 20–29)
Calcium: 9.7 mg/dL (ref 8.7–10.2)
Chloride: 102 mmol/L (ref 96–106)
Creatinine, Ser: 0.77 mg/dL (ref 0.76–1.27)
Glucose: 59 mg/dL — ABNORMAL LOW (ref 65–99)
Potassium: 4.4 mmol/L (ref 3.5–5.2)
Sodium: 139 mmol/L (ref 134–144)
eGFR: 127 mL/min/{1.73_m2} (ref >59)

## 2020-12-22 NOTE — Addendum Note (Signed)
Addended by: Rema Fendt on: 12/22/2020 12:07 PM   Modules accepted: Orders

## 2020-12-22 NOTE — Progress Notes (Signed)
Kidney function normal.   Mild anemia increase iron-rich foods in the diet such as dark green leafy vegetables and dried fruit such as raisins and apricots.

## 2020-12-24 ENCOUNTER — Other Ambulatory Visit: Payer: Self-pay

## 2020-12-25 ENCOUNTER — Other Ambulatory Visit: Payer: Self-pay

## 2021-01-01 ENCOUNTER — Other Ambulatory Visit: Payer: Self-pay

## 2021-01-01 ENCOUNTER — Encounter: Payer: Self-pay | Admitting: Endocrinology

## 2021-01-08 ENCOUNTER — Other Ambulatory Visit: Payer: Self-pay | Admitting: Family

## 2021-01-08 ENCOUNTER — Other Ambulatory Visit: Payer: Self-pay

## 2021-01-08 DIAGNOSIS — E1010 Type 1 diabetes mellitus with ketoacidosis without coma: Secondary | ICD-10-CM

## 2021-01-08 MED ORDER — TRUEPLUS LANCETS 28G MISC
4 refills | Status: DC
  Filled 2021-01-08: qty 100, 25d supply, fill #0

## 2021-01-08 MED ORDER — TRUE METRIX BLOOD GLUCOSE TEST VI STRP
ORAL_STRIP | 12 refills | Status: DC
Start: 2021-01-08 — End: 2023-09-04
  Filled 2021-01-08: qty 100, 25d supply, fill #0

## 2021-01-09 ENCOUNTER — Other Ambulatory Visit: Payer: Self-pay

## 2021-01-16 ENCOUNTER — Other Ambulatory Visit: Payer: Self-pay

## 2021-02-06 ENCOUNTER — Telehealth: Payer: Self-pay | Admitting: Nurse Practitioner

## 2021-02-06 ENCOUNTER — Other Ambulatory Visit: Payer: Self-pay

## 2021-02-06 ENCOUNTER — Ambulatory Visit: Payer: Self-pay | Attending: Nurse Practitioner | Admitting: Nurse Practitioner

## 2021-02-06 NOTE — Telephone Encounter (Signed)
"   We are sorry your call cannot be completed at that time please hang up and try to call again later thank you"

## 2021-02-06 NOTE — Telephone Encounter (Signed)
"   The number you have dialed is not in service. Please check that number you have dialed and try again. thank you"    " We are sorry your call cannot be completed at this time please hang up and try your call again later.

## 2021-02-27 ENCOUNTER — Emergency Department (HOSPITAL_COMMUNITY): Payer: Self-pay

## 2021-02-27 ENCOUNTER — Inpatient Hospital Stay (HOSPITAL_COMMUNITY)
Admission: EM | Admit: 2021-02-27 | Discharge: 2021-02-28 | DRG: 638 | Disposition: A | Discharge status: Home / Self Care | Payer: Self-pay | Attending: Internal Medicine | Admitting: Internal Medicine

## 2021-02-27 ENCOUNTER — Encounter (HOSPITAL_COMMUNITY): Payer: Self-pay

## 2021-02-27 ENCOUNTER — Other Ambulatory Visit: Payer: Self-pay

## 2021-02-27 DIAGNOSIS — F1290 Cannabis use, unspecified, uncomplicated: Secondary | ICD-10-CM | POA: Diagnosis present

## 2021-02-27 DIAGNOSIS — E876 Hypokalemia: Secondary | ICD-10-CM | POA: Diagnosis present

## 2021-02-27 DIAGNOSIS — N179 Acute kidney failure, unspecified: Secondary | ICD-10-CM | POA: Diagnosis present

## 2021-02-27 DIAGNOSIS — Z794 Long term (current) use of insulin: Secondary | ICD-10-CM

## 2021-02-27 DIAGNOSIS — E8729 Other acidosis: Secondary | ICD-10-CM

## 2021-02-27 DIAGNOSIS — Z87891 Personal history of nicotine dependence: Secondary | ICD-10-CM

## 2021-02-27 DIAGNOSIS — E875 Hyperkalemia: Secondary | ICD-10-CM | POA: Diagnosis present

## 2021-02-27 DIAGNOSIS — Z79899 Other long term (current) drug therapy: Secondary | ICD-10-CM

## 2021-02-27 DIAGNOSIS — E1010 Type 1 diabetes mellitus with ketoacidosis without coma: Principal | ICD-10-CM | POA: Diagnosis present

## 2021-02-27 DIAGNOSIS — Z20822 Contact with and (suspected) exposure to covid-19: Secondary | ICD-10-CM | POA: Diagnosis present

## 2021-02-27 DIAGNOSIS — E871 Hypo-osmolality and hyponatremia: Secondary | ICD-10-CM | POA: Diagnosis present

## 2021-02-27 DIAGNOSIS — Z8249 Family history of ischemic heart disease and other diseases of the circulatory system: Secondary | ICD-10-CM

## 2021-02-27 DIAGNOSIS — D75839 Thrombocytosis, unspecified: Secondary | ICD-10-CM | POA: Diagnosis present

## 2021-02-27 DIAGNOSIS — E872 Acidosis: Secondary | ICD-10-CM

## 2021-02-27 DIAGNOSIS — E860 Dehydration: Secondary | ICD-10-CM | POA: Diagnosis present

## 2021-02-27 LAB — CBG MONITORING, ED
Glucose-Capillary: >600 mg/dL (ref 70–99)
Glucose-Capillary: >600 mg/dL (ref 70–99)
Glucose-Capillary: >600 mg/dL (ref 70–99)
Glucose-Capillary: 440 mg/dL — ABNORMAL HIGH (ref 70–99)
Glucose-Capillary: 593 mg/dL (ref 70–99)

## 2021-02-27 LAB — CBC WITH DIFFERENTIAL/PLATELET
Abs Immature Granulocytes: 0.15 10*3/uL — ABNORMAL HIGH (ref 0.00–0.07)
Basophils Absolute: 0.1 10*3/uL (ref 0.0–0.1)
Basophils Relative: 1 %
Eosinophils Absolute: 0 10*3/uL (ref 0.0–0.5)
Eosinophils Relative: 0 %
HCT: 45.9 % (ref 39.0–52.0)
Hemoglobin: 14.7 g/dL (ref 13.0–17.0)
Immature Granulocytes: 1 %
Lymphocytes Relative: 16 %
Lymphs Abs: 2.1 10*3/uL (ref 0.7–4.0)
MCH: 29.5 pg (ref 26.0–34.0)
MCHC: 32 g/dL (ref 30.0–36.0)
MCV: 92 fL (ref 80.0–100.0)
Monocytes Absolute: 0.9 10*3/uL (ref 0.1–1.0)
Monocytes Relative: 7 %
Neutro Abs: 9.9 10*3/uL — ABNORMAL HIGH (ref 1.7–7.7)
Neutrophils Relative %: 75 %
Platelets: 487 10*3/uL — ABNORMAL HIGH (ref 150–400)
RBC: 4.99 MIL/uL (ref 4.22–5.81)
RDW: 13.2 % (ref 11.5–15.5)
WBC: 13.1 10*3/uL — ABNORMAL HIGH (ref 4.0–10.5)
nRBC: 0 % (ref 0.0–0.2)

## 2021-02-27 LAB — BLOOD GAS, VENOUS
Acid-base deficit: 19.9 mmol/L — ABNORMAL HIGH (ref 0.0–2.0)
Bicarbonate: 9 mmol/L — ABNORMAL LOW (ref 20.0–28.0)
O2 Saturation: 58.8 %
Patient temperature: 98.6
pCO2, Ven: 29.7 mmHg — ABNORMAL LOW (ref 44.0–60.0)
pH, Ven: 7.111 — CL (ref 7.250–7.430)
pO2, Ven: 40.2 mmHg (ref 32.0–45.0)

## 2021-02-27 LAB — BASIC METABOLIC PANEL
Anion gap: >20 — ABNORMAL HIGH (ref 5–15)
Anion gap: >20 — ABNORMAL HIGH (ref 5–15)
Anion gap: 16 — ABNORMAL HIGH (ref 5–15)
Anion gap: 14 (ref 5–15)
Anion gap: 12 (ref 5–15)
BUN: 27 mg/dL — ABNORMAL HIGH (ref 6–20)
BUN: 24 mg/dL — ABNORMAL HIGH (ref 6–20)
BUN: 22 mg/dL — ABNORMAL HIGH (ref 6–20)
BUN: 18 mg/dL (ref 6–20)
BUN: 17 mg/dL (ref 6–20)
CO2: 7 mmol/L — ABNORMAL LOW (ref 22–32)
CO2: 12 mmol/L — ABNORMAL LOW (ref 22–32)
CO2: 18 mmol/L — ABNORMAL LOW (ref 22–32)
CO2: 20 mmol/L — ABNORMAL LOW (ref 22–32)
CO2: 22 mmol/L (ref 22–32)
Calcium: 9.5 mg/dL (ref 8.9–10.3)
Calcium: 9.6 mg/dL (ref 8.9–10.3)
Calcium: 9.4 mg/dL (ref 8.9–10.3)
Calcium: 9.3 mg/dL (ref 8.9–10.3)
Calcium: 8.6 mg/dL — ABNORMAL LOW (ref 8.9–10.3)
Chloride: 91 mmol/L — ABNORMAL LOW (ref 98–111)
Chloride: 103 mmol/L (ref 98–111)
Chloride: 103 mmol/L (ref 98–111)
Chloride: 105 mmol/L (ref 98–111)
Chloride: 100 mmol/L (ref 98–111)
Creatinine, Ser: 1.53 mg/dL — ABNORMAL HIGH (ref 0.61–1.24)
Creatinine, Ser: 1.28 mg/dL — ABNORMAL HIGH (ref 0.61–1.24)
Creatinine, Ser: 1.08 mg/dL (ref 0.61–1.24)
Creatinine, Ser: 0.98 mg/dL (ref 0.61–1.24)
Creatinine, Ser: 0.99 mg/dL (ref 0.61–1.24)
GFR, Estimated: >60 mL/min (ref >60)
GFR, Estimated: >60 mL/min (ref >60)
GFR, Estimated: >60 mL/min (ref >60)
GFR, Estimated: >60 mL/min (ref >60)
GFR, Estimated: >60 mL/min (ref >60)
Glucose, Bld: 714 mg/dL (ref 70–99)
Glucose, Bld: 279 mg/dL — ABNORMAL HIGH (ref 70–99)
Glucose, Bld: 216 mg/dL — ABNORMAL HIGH (ref 70–99)
Glucose, Bld: 165 mg/dL — ABNORMAL HIGH (ref 70–99)
Glucose, Bld: 144 mg/dL — ABNORMAL HIGH (ref 70–99)
Potassium: 5 mmol/L (ref 3.5–5.1)
Potassium: 4.5 mmol/L (ref 3.5–5.1)
Potassium: 4.3 mmol/L (ref 3.5–5.1)
Potassium: 4.3 mmol/L (ref 3.5–5.1)
Potassium: 3.7 mmol/L (ref 3.5–5.1)
Sodium: 129 mmol/L — ABNORMAL LOW (ref 135–145)
Sodium: 137 mmol/L (ref 135–145)
Sodium: 137 mmol/L (ref 135–145)
Sodium: 139 mmol/L (ref 135–145)
Sodium: 134 mmol/L — ABNORMAL LOW (ref 135–145)

## 2021-02-27 LAB — GLUCOSE, CAPILLARY
Glucose-Capillary: 383 mg/dL — ABNORMAL HIGH (ref 70–99)
Glucose-Capillary: 289 mg/dL — ABNORMAL HIGH (ref 70–99)
Glucose-Capillary: 206 mg/dL — ABNORMAL HIGH (ref 70–99)
Glucose-Capillary: 219 mg/dL — ABNORMAL HIGH (ref 70–99)
Glucose-Capillary: 226 mg/dL — ABNORMAL HIGH (ref 70–99)
Glucose-Capillary: 179 mg/dL — ABNORMAL HIGH (ref 70–99)
Glucose-Capillary: 169 mg/dL — ABNORMAL HIGH (ref 70–99)
Glucose-Capillary: 147 mg/dL — ABNORMAL HIGH (ref 70–99)
Glucose-Capillary: 177 mg/dL — ABNORMAL HIGH (ref 70–99)
Glucose-Capillary: 142 mg/dL — ABNORMAL HIGH (ref 70–99)
Glucose-Capillary: 155 mg/dL — ABNORMAL HIGH (ref 70–99)

## 2021-02-27 LAB — URINALYSIS, ROUTINE W REFLEX MICROSCOPIC
Bacteria, UA: NONE SEEN
Bilirubin Urine: NEGATIVE
Glucose, UA: >=500 mg/dL — AB
Hgb urine dipstick: NEGATIVE
Ketones, ur: 80 mg/dL — AB
Leukocytes,Ua: NEGATIVE
Nitrite: NEGATIVE
Protein, ur: NEGATIVE mg/dL
Specific Gravity, Urine: 1.022 (ref 1.005–1.030)
pH: 6 (ref 5.0–8.0)

## 2021-02-27 LAB — COMPREHENSIVE METABOLIC PANEL
ALT: 46 U/L — ABNORMAL HIGH (ref 0–44)
AST: 46 U/L — ABNORMAL HIGH (ref 15–41)
Albumin: 4.6 g/dL (ref 3.5–5.0)
Alkaline Phosphatase: 108 U/L (ref 38–126)
Anion gap: 28 — ABNORMAL HIGH (ref 5–15)
BUN: 26 mg/dL — ABNORMAL HIGH (ref 6–20)
CO2: 11 mmol/L — ABNORMAL LOW (ref 22–32)
Calcium: 9.8 mg/dL (ref 8.9–10.3)
Chloride: 90 mmol/L — ABNORMAL LOW (ref 98–111)
Creatinine, Ser: 1.67 mg/dL — ABNORMAL HIGH (ref 0.61–1.24)
GFR, Estimated: 58 mL/min — ABNORMAL LOW (ref >60)
Glucose, Bld: 830 mg/dL (ref 70–99)
Potassium: 6 mmol/L — ABNORMAL HIGH (ref 3.5–5.1)
Sodium: 129 mmol/L — ABNORMAL LOW (ref 135–145)
Total Bilirubin: 1.7 mg/dL — ABNORMAL HIGH (ref 0.3–1.2)
Total Protein: 8.2 g/dL — ABNORMAL HIGH (ref 6.5–8.1)

## 2021-02-27 LAB — OSMOLALITY: Osmolality: 345 mOsm/kg (ref 275–295)

## 2021-02-27 LAB — MRSA PCR SCREENING: MRSA by PCR: NEGATIVE

## 2021-02-27 LAB — BETA-HYDROXYBUTYRIC ACID
Beta-Hydroxybutyric Acid: >8 mmol/L — ABNORMAL HIGH (ref 0.05–0.27)
Beta-Hydroxybutyric Acid: >8 mmol/L — ABNORMAL HIGH (ref 0.05–0.27)
Beta-Hydroxybutyric Acid: 7.23 mmol/L — ABNORMAL HIGH (ref 0.05–0.27)
Beta-Hydroxybutyric Acid: 3.36 mmol/L — ABNORMAL HIGH (ref 0.05–0.27)

## 2021-02-27 LAB — LIPASE, BLOOD: Lipase: 18 U/L (ref 11–51)

## 2021-02-27 LAB — SARS CORONAVIRUS 2 (TAT 6-24 HRS): SARS Coronavirus 2: NEGATIVE

## 2021-02-27 MED ORDER — ONDANSETRON HCL 4 MG/2ML IJ SOLN
4.0000 mg | Freq: Four times a day (QID) | INTRAMUSCULAR | Status: DC | PRN
  Administered 2021-02-27: 4 mg via INTRAVENOUS
  Filled 2021-02-27: qty 2

## 2021-02-27 MED ORDER — DEXTROSE 50 % IV SOLN: 0.0000 mL | INTRAVENOUS | Status: DC | PRN

## 2021-02-27 MED ORDER — SODIUM CHLORIDE 0.9 % IV BOLUS
1000.0000 mL | Freq: Once | INTRAVENOUS | Status: AC
  Administered 2021-02-27: 1000 mL via INTRAVENOUS

## 2021-02-27 MED ORDER — LACTATED RINGERS IV SOLN: INTRAVENOUS | Status: DC

## 2021-02-27 MED ORDER — CHLORHEXIDINE GLUCONATE CLOTH 2 % EX PADS
6.0000 | MEDICATED_PAD | Freq: Every day | CUTANEOUS | Status: DC
  Administered 2021-02-27 – 2021-02-28 (×2): 6 via TOPICAL

## 2021-02-27 MED ORDER — ONDANSETRON HCL 4 MG/2ML IJ SOLN
4.0000 mg | Freq: Once | INTRAMUSCULAR | Status: AC
  Administered 2021-02-27: 4 mg via INTRAVENOUS
  Filled 2021-02-27: qty 2

## 2021-02-27 MED ORDER — LACTATED RINGERS IV BOLUS
20.0000 mL/kg | Freq: Once | INTRAVENOUS | Status: AC
  Administered 2021-02-27: 1180 mL via INTRAVENOUS

## 2021-02-27 MED ORDER — ORAL CARE MOUTH RINSE
15.0000 mL | Freq: Two times a day (BID) | OROMUCOSAL | Status: DC
  Administered 2021-02-28: 15 mL via OROMUCOSAL

## 2021-02-27 MED ORDER — DEXTROSE IN LACTATED RINGERS 5 % IV SOLN: INTRAVENOUS | Status: DC

## 2021-02-27 MED ORDER — ORAL CARE MOUTH RINSE
15.0000 mL | Freq: Two times a day (BID) | OROMUCOSAL | Status: DC
  Administered 2021-02-27: 15 mL via OROMUCOSAL

## 2021-02-27 MED ORDER — CHLORHEXIDINE GLUCONATE 0.12 % MT SOLN
15.0000 mL | Freq: Two times a day (BID) | OROMUCOSAL | Status: DC
  Administered 2021-02-27: 15 mL via OROMUCOSAL
  Filled 2021-02-27: qty 15

## 2021-02-27 MED ORDER — INSULIN REGULAR(HUMAN) IN NACL 100-0.9 UT/100ML-% IV SOLN
INTRAVENOUS | Status: DC
  Administered 2021-02-27: 7 [IU]/h via INTRAVENOUS
  Filled 2021-02-27: qty 100

## 2021-02-27 MED ORDER — CHLORHEXIDINE GLUCONATE 0.12 % MT SOLN
15.0000 mL | Freq: Two times a day (BID) | OROMUCOSAL | Status: DC
  Administered 2021-02-27 – 2021-02-28 (×2): 15 mL via OROMUCOSAL
  Filled 2021-02-27: qty 15

## 2021-02-27 MED ORDER — ENOXAPARIN SODIUM 40 MG/0.4ML IJ SOSY
40.0000 mg | PREFILLED_SYRINGE | INTRAMUSCULAR | Status: DC
  Administered 2021-02-27 – 2021-02-28 (×2): 40 mg via SUBCUTANEOUS
  Filled 2021-02-27 (×2): qty 0.4

## 2021-02-27 NOTE — Progress Notes (Addendum)
Received report from [place], Charity fundraiser. Patient alert and oriented and resting in bed.

## 2021-02-27 NOTE — H&P (Signed)
History and Physical    Joseph Barr JGG:[id number] DOB: 1995-12-25 DOA: 02/27/2021  PCP: [name], NP  Patient coming from: Home  I have personally briefly reviewed patient's old medical records in [place]  Chief Complaint: Hyperglycemia  HPI: Joseph Barr is a 25 y.o. male with medical history significant of frequent DKA admits, Marijuana use.  Pt presents to ED with N/V ongoing for past couple of days.  Says he took 12u insulin PTA.  Unable to tolerate PO intake for past 24H.  No fevers, chills.  Symptoms are constant, severe, worsening, nothing makes better or worse.   ED Course: DKA with pH of 7.11, AG 28.   Review of Systems: As per HPI, otherwise all review of systems negative.  Past Medical History:  Diagnosis Date   Diabetes mellitus     History reviewed. No pertinent surgical history.   reports that he has quit smoking. His smoking use included e-cigarettes. He has quit using smokeless tobacco. He reports current drug use. Drug: Marijuana. He reports that he does not drink alcohol.  No Known Allergies  Family History  Problem Relation Age of Onset   Cancer Maternal Grandfather    Hypertension Mother    Diabetes Neg Hx      Prior to Admission medications   Medication Sig Start Date End Date Taking? Authorizing Provider  atorvastatin (LIPITOR) 20 MG tablet Take 1 tablet (20 mg total) by mouth daily. 12/21/20   Camillia Herter, NP  Blood Glucose Monitoring Suppl (TRUE METRIX METER) w/Device KIT Use as directed 12/21/20   Camillia Herter, NP  glucose blood (ACCU-CHEK AVIVA) test strip 1 each by Other route 4 (four) times daily. And lancets 250.03 06/26/14   Renato Shin, MD  glucose blood (TRUE METRIX BLOOD GLUCOSE TEST) test strip Use as instructed 01/08/21   Camillia Herter, NP  insulin aspart (NOVOLOG FLEXPEN) 100 UNIT/ML FlexPen Inject 12-20 Units into the skin 3 (three) times daily with meals. Sliding scale 12/21/20   Camillia Herter, NP   insulin glargine (LANTUS SOLOSTAR) 100 UNIT/ML Solostar Pen Inject 36 Units into the skin daily. 12/21/20 03/31/21  Camillia Herter, NP  Insulin Pen Needle 31G X 5 MM MISC Use as directed in the morning, at noon, and at bedtime. 04/09/20   [name], NP  lisinopril (ZESTRIL) 5 MG tablet Take 0.5 tablets (2.5 mg total) by mouth daily. 12/21/20   Camillia Herter, NP  pantoprazole (PROTONIX) 40 MG tablet Take 1 tablet (40 mg total) by mouth daily. Patient taking differently: Take 20 mg by mouth daily. 04/09/20 07/08/20  [name], NP  TRUEplus Lancets 28G MISC Use as directed 01/08/21   Camillia Herter, NP    Physical Exam: Vitals:   02/27/21 0430 02/27/21 0445 02/27/21 0500 02/27/21 0515  BP: 117/80 118/84 123/82 120/80  Pulse: (!) 128 (!) 118 (!) 118 (!) 121  Resp: _0 (!) 21  Temp:      TempSrc:      SpO2: 100% 100% 100% 100%  Weight:      Height:        Constitutional: NAD, calm, comfortable Eyes: PERRL, lids and conjunctivae normal ENMT: Mucous membranes are moist. Posterior pharynx clear of any exudate or lesions.Normal dentition.  Neck: normal, supple, no masses, no thyromegaly Respiratory: clear to auscultation bilaterally, no wheezing, no crackles. Normal respiratory effort. No accessory muscle use.  Cardiovascular: Regular rate and rhythm, no murmurs /  rubs / gallops. No extremity edema. 2+ pedal pulses. No carotid bruits.  Abdomen: no tenderness, no masses palpated. No hepatosplenomegaly. Bowel sounds positive.  Musculoskeletal: no clubbing / cyanosis. No joint deformity upper and lower extremities. Good ROM, no contractures. Normal muscle tone.  Skin: no rashes, lesions, ulcers. No induration Neurologic: CN 2-12 grossly intact. Sensation intact, DTR normal. Strength 5/5 in all 4.  Psychiatric: Normal judgment and insight. Alert and oriented x 3. Normal mood.    Labs on Admission: I have personally reviewed following labs and imaging studies  CBC: Recent Labs   Lab 02/27/21 0417  WBC 13.1*  NEUTROABS 9.9*  HGB 14.7  HCT 45.9  MCV 92.0  PLT 008*   Basic Metabolic Panel: Recent Labs  Lab 02/27/21 0417  NA 129*  K 6.0*  CL 90*  CO2 11*  GLUCOSE 830*  BUN 26*  CREATININE 1.67*  CALCIUM 9.8   GFR: Estimated Creatinine Clearance: 55.9 mL/min (A) (by C-G formula based on SCr of 1.67 mg/dL (H)). Liver Function Tests: Recent Labs  Lab 02/27/21 0417  AST 46*  ALT 46*  ALKPHOS 108  BILITOT 1.7*  PROT 8.2*  ALBUMIN 4.6   Recent Labs  Lab 02/27/21 0417  LIPASE 18   No results for input(s): AMMONIA in the last 168 hours. Coagulation Profile: No results for input(s): INR, PROTIME in the last 168 hours. Cardiac Enzymes: No results for input(s): CKTOTAL, CKMB, CKMBINDEX, TROPONINI in the last 168 hours. BNP (last 3 results) No results for input(s): PROBNP in the last 8760 hours. HbA1C: No results for input(s): HGBA1C in the last 72 hours. CBG: Recent Labs  Lab 02/27/21 0350 02/27/21 0530  GLUCAP >600* >600*   Lipid Profile: No results for input(s): CHOL, HDL, LDLCALC, TRIG, CHOLHDL, LDLDIRECT in the last 72 hours. Thyroid Function Tests: No results for input(s): TSH, T4TOTAL, FREET4, T3FREE, THYROIDAB in the last 72 hours. Anemia Panel: No results for input(s): VITAMINB12, FOLATE, FERRITIN, TIBC, IRON, RETICCTPCT in the last 72 hours. Urine analysis:    Component Value Date/Time   COLORURINE STRAW (A) 12/04/2020 2130   APPEARANCEUR CLEAR 12/04/2020 2130   LABSPEC 1.009 12/04/2020 2130   PHURINE 9.0 (H) 12/04/2020 2130   GLUCOSEU 150 (A) 12/04/2020 2130   HGBUR NEGATIVE 12/04/2020 2130   BILIRUBINUR NEGATIVE 12/04/2020 2130   BILIRUBINUR Negative 08/01/2016 0950   KETONESUR 20 (A) 12/04/2020 2130   PROTEINUR NEGATIVE 12/04/2020 2130   UROBILINOGEN 0.2 08/01/2016 0950   UROBILINOGEN 0.2 04/24/2015 1305   NITRITE NEGATIVE 12/04/2020 2130   LEUKOCYTESUR NEGATIVE 12/04/2020 2130    Radiological Exams on  Admission: DG Chest 1 View  Result Date: 02/27/2021 CLINICAL DATA:  Hyperglycemia, diabetic ketoacidosis EXAM: CHEST  1 VIEW COMPARISON:  12/03/2020 FINDINGS: The heart size and mediastinal contours are within normal limits. Both lungs are clear. The visualized skeletal structures are unremarkable. IMPRESSION: No active disease. Electronically Signed   By: Fidela Salisbury MD   On: 02/27/2021 04:44    EKG: Independently reviewed.  Assessment/Plan Principal Problem:   DKA, type 1 ([place]) Active Problems:   AKI (acute kidney injury) ([place])    DKA - DKA pathway Insulin gtt IVF per pathway Giving a second 1L NS bolus BMP Q4H AKI - Likely pre-renal due to dehydration associated with #1 IVF as above Serial BMPs as above  DVT prophylaxis: Lovenox Code Status: Full Family Communication: No family in room Disposition Plan: Home after DKA resolved Consults called: None Admission status: Admit to inpatient  Severity  of Illness: The appropriate patient status for this patient is INPATIENT. Inpatient status is judged to be reasonable and necessary in order to provide the required intensity of service to ensure the patient's safety. The patient's presenting symptoms, physical exam findings, and initial radiographic and laboratory data in the context of their chronic comorbidities is felt to place them at high risk for further clinical deterioration. Furthermore, it is not anticipated that the patient will be medically stable for discharge from the hospital within 2 midnights of admission. The following factors support the patient status of inpatient.   IP status for treatment of DKA.  * I certify that at the point of admission it is my clinical judgment that the patient will require inpatient hospital care spanning beyond 2 midnights from the point of admission due to high intensity of service, high risk for further deterioration and high frequency of surveillance required.*   Elane Peabody M.  DO [facility]  How to contact the [facility] Attending or Consulting provider [place] or covering provider during after hours [place], for this patient?  Check the care team in [facility] and look for a) attending/consulting [facility] provider listed and b) the [facility] team listed Log into [email or web address]  Amion Physician Scheduling and messaging for groups and whole hospitals  On call and physician scheduling software for group practices, residents, hospitalists and other medical providers for call, clinic, rotation and shift schedules. OnCall [employer] is a hospital-wide system for scheduling doctors and paging doctors on call. EasyPlot is for scientific plotting and data analysis.  [email or web address]  and use [place]'s universal password to access. If you do not have the password, please contact the hospital operator.  Locate the [facility] provider you are looking for under [facility] and page to a number that you can be directly reached. If you still have difficulty reaching the provider, please page the [facility] (Director on Call) for the Hospitalists listed on amion for assistance.  02/27/2021, 6:07 AM

## 2021-02-27 NOTE — Progress Notes (Signed)
Critical value:  Osmolarity 345. MD notified. No new orders

## 2021-02-27 NOTE — Progress Notes (Signed)
Patient ID: Joseph Barr, male   DOB: [birth year], 25 y.o.   MRN: [id number] Patient admitted early this morning for DKA/acute kidney injury/hyperkalemia and is currently on IV fluids.  I have reviewed patient's medical records including this morning's H&P, current vitals, labs, medications myself.  Patient seen and examined at bedside and plan of care discussed with patient and nurse at bedside.  Continue insulin drip and iv fluids.  Monitor BMP every 4 hours.  Diabetes coordinator consult.

## 2021-02-27 NOTE — ED Notes (Signed)
Secure message sent to Dianne Dun, RN in regards to the patient having a ready admission bed.

## 2021-02-27 NOTE — ED Notes (Signed)
Ready for transfer to ICU bed- 223

## 2021-02-27 NOTE — ED Triage Notes (Signed)
Pt arrives EMS from home with c/o hyperglycemia, nausea, vomiting and dizziness. Frequent DKA. Type 1 diabetic. Cbg pta 536

## 2021-02-27 NOTE — Progress Notes (Signed)
Inpatient Diabetes Program Recommendations  AACE/ADA: New Consensus Statement on Inpatient Glycemic Control (2015)  Target Ranges:  Prepandial:   less than 140 mg/dL      Peak postprandial:   less than 180 mg/dL (1-2 hours)      Critically ill patients:  140 - 180 mg/dL   Lab Results  Component Value Date   GLUCAP 147 (H) 02/27/2021   HGBA1C 14.1 (H) 12/03/2020    Review of Glycemic Control  Diabetes history: DM1 Outpatient Diabetes medications: Lantus 36 units QHS, Novolog 12-20 units TID with meals Current orders for Inpatient glycemic control: IV  insulin per Endo[facility] for DKA  Updated HgbA1C pending Not ready for transition quite yet.  Inpatient Diabetes Program Recommendations:    When criteria met for transitioning to basal/bolus insulin, give Lantus 1-2H prior to discontinuation of drip.  Lantus 24 units Q24H Novolog 0-9 units TID with meals and 0-5 HS Novolog 6 units TID with meals if eating > 50%  Spoke with pt at bedside regarding his diabetes control at home and DKA admissions. Pt states he checks blood sugars at least 4x/day and takes insulin as prescribed. Said his diet is "very very healthy" and never eats sweets. Admits to drinking juice at breakfast and states he will not change this. Appears to have lost weight since last admission. Michela Pitcher he is working in Architect outside and blood sugars have been "pretty good." Appears somewhat anxious. Michela Pitcher he still has not seen an Endo since he has no insurance. Follows up at [facility] every 6 months.   Continue to follow.  Thank you. Lorenda Peck, RD, LDN, CDE Inpatient Diabetes Coordinator [phone]

## 2021-02-27 NOTE — ED Provider Notes (Signed)
[facility] DEPT Provider Note   CSN: [id number] Arrival date & time: 02/27/21  4268     History Chief Complaint  Patient presents with   Hyperglycemia    Joseph Barr is a 25 y.o. male presents to the Emergency Department complaining of gradual, persistent, progressively worsening nausea and vomiting onset days ago.  Patient reports a history of DKA and similar today.  Reports he took 12 units of insulin prior to arrival.  States has been unable to keep down any oral fluids or liquids for greater than 24 hours.  No specific aggravating or alleviating factors.  Patient denies known sick contacts, fever or chills at home.  Level 5 caveat  - AMS   The history is provided by the patient and medical records. No language interpreter was used.      Past Medical History:  Diagnosis Date   Diabetes mellitus     Patient Active Problem List   Diagnosis Date Noted   Pseudohyponatremia 12/03/2020   Hypochloremia 12/03/2020   SIRS (systemic inflammatory response syndrome) ([place]) 11/19/2019   Renal insufficiency 11/19/2019   Hyperkalemia 06/11/2017   Tobacco abuse 06/11/2017   Chest pain 06/11/2017   Increased anion gap metabolic acidosis 34/19/6222   DKA (diabetic ketoacidoses) 04/24/2015   DKA, type 1 ([place]) 04/24/2015   AKI (acute kidney injury) ([place]) 04/24/2015   Nausea with vomiting 12/06/2014   Diabetes mellitus type I ([place]) 01/21/2012   Diabetes mellitus type 1 ([place]) 01/06/2012   Hyperglycemia 01/06/2012   Ketonuria 01/06/2012   Headache(784.0) 01/06/2012    History reviewed. No pertinent surgical history.     Family History  Problem Relation Age of Onset   Cancer Maternal Grandfather    Hypertension Mother    Diabetes Neg Hx     Social History   Tobacco Use   Smoking status: Former    Pack years: 0.00    Types: E-cigarettes   Smokeless tobacco: Former  Scientific laboratory technician Use: Former  Substance Use Topics   Alcohol use: No    Drug use: Yes    Types: Marijuana    Home Medications Prior to Admission medications   Medication Sig Start Date End Date Taking? Authorizing Provider  atorvastatin (LIPITOR) 20 MG tablet Take 1 tablet (20 mg total) by mouth daily. 12/21/20   Camillia Herter, NP  Blood Glucose Monitoring Suppl (TRUE METRIX METER) w/Device KIT Use as directed 12/21/20   Camillia Herter, NP  glucose blood (ACCU-CHEK AVIVA) test strip 1 each by Other route 4 (four) times daily. And lancets 250.03 06/26/14   Renato Shin, MD  glucose blood (TRUE METRIX BLOOD GLUCOSE TEST) test strip Use as instructed 01/08/21   Camillia Herter, NP  insulin aspart (NOVOLOG FLEXPEN) 100 UNIT/ML FlexPen Inject 12-20 Units into the skin 3 (three) times daily with meals. Sliding scale 12/21/20   Camillia Herter, NP  insulin glargine (LANTUS SOLOSTAR) 100 UNIT/ML Solostar Pen Inject 36 Units into the skin daily. 12/21/20 03/31/21  Camillia Herter, NP  Insulin Pen Needle 31G X 5 MM MISC Use as directed in the morning, at noon, and at bedtime. 04/09/20   [name], NP  lisinopril (ZESTRIL) 5 MG tablet Take 0.5 tablets (2.5 mg total) by mouth daily. 12/21/20   Camillia Herter, NP  pantoprazole (PROTONIX) 40 MG tablet Take 1 tablet (40 mg total) by mouth daily. Patient taking differently: Take 20 mg by mouth daily. 04/09/20 07/08/20  Raul Del,  [name], NP  TRUEplus Lancets 28G MISC Use as directed 01/08/21   Camillia Herter, NP    Allergies    Patient has no known allergies.  Review of Systems   Review of Systems  Unable to perform ROS: Mental status change  Constitutional:  Negative for fever.  Endocrine: Positive for polydipsia, polyphagia and polyuria.   Physical Exam Updated Vital Signs BP 107/73   Pulse (!) 134   Temp 98.1 F (36.7 C) (Oral)   Resp 20   Ht 6' (1.829 m)   Wt 59 kg   SpO2 94%   BMI 17.63 kg/m   Physical Exam Vitals and nursing note reviewed.  Constitutional:      General: He is not in acute distress.     Appearance: He is ill-appearing. He is not diaphoretic.     Comments: Acute and chronically ill appearing Dry heaving  HENT:     Head: Normocephalic.     Nose: Nose normal.     Mouth/Throat:     Mouth: Mucous membranes are dry.  Eyes:     General: No scleral icterus.    Conjunctiva/sclera: Conjunctivae normal.  Cardiovascular:     Rate and Rhythm: Regular rhythm. Tachycardia present.     Pulses: Normal pulses.          Radial pulses are 2+ on the right side and 2+ on the left side.  Pulmonary:     Effort: Tachypnea present. No accessory muscle usage, prolonged expiration, respiratory distress or retractions.     Breath sounds: No stridor.     Comments: Equal chest rise. No increased work of breathing. Abdominal:     General: There is no distension.     Palpations: Abdomen is soft.     Tenderness: There is generalized abdominal tenderness. There is no guarding or rebound.  Musculoskeletal:     Cervical back: Normal range of motion.     Comments: Moves all extremities equally and without difficulty.  Skin:    General: Skin is warm and dry.     Capillary Refill: Capillary refill takes less than 2 seconds.  Neurological:     Mental Status: He is lethargic.     GCS: GCS eye subscore is 4. GCS verbal subscore is 5. GCS motor subscore is 6.     Comments: Speech is clear and goal oriented.  Psychiatric:        Mood and Affect: Mood normal.    ED Results / Procedures / Treatments   Labs (all labs ordered are listed, but only abnormal results are displayed) Labs Reviewed  CBC WITH DIFFERENTIAL/PLATELET - Abnormal; Notable for the following components:      Result Value   WBC 13.1 (*)    Platelets 487 (*)    Neutro Abs 9.9 (*)    Abs Immature Granulocytes 0.15 (*)    All other components within normal limits  COMPREHENSIVE METABOLIC PANEL - Abnormal; Notable for the following components:   Sodium 129 (*)    Potassium 6.0 (*)    Chloride 90 (*)    CO2 11 (*)    Glucose,  Bld 830 (*)    BUN 26 (*)    Creatinine, Ser 1.67 (*)    Total Protein 8.2 (*)    AST 46 (*)    ALT 46 (*)    Total Bilirubin 1.7 (*)    GFR, Estimated 58 (*)    Anion gap 28 (*)    All other components within  normal limits  BLOOD GAS, VENOUS - Abnormal; Notable for the following components:   pH, Ven 7.111 (*)    pCO2, Ven 29.7 (*)    Bicarbonate 9.0 (*)    Acid-base deficit 19.9 (*)    All other components within normal limits  CBG MONITORING, ED - Abnormal; Notable for the following components:   Glucose-Capillary >600 (*)    All other components within normal limits  CBG MONITORING, ED - Abnormal; Notable for the following components:   Glucose-Capillary >600 (*)    All other components within normal limits  URINE CULTURE  SARS CORONAVIRUS 2 (TAT 6-24 HRS)  LIPASE, BLOOD  BETA-HYDROXYBUTYRIC ACID  URINALYSIS, ROUTINE W REFLEX MICROSCOPIC  OSMOLALITY  BASIC METABOLIC PANEL  BASIC METABOLIC PANEL  BASIC METABOLIC PANEL  BASIC METABOLIC PANEL  BASIC METABOLIC PANEL  BETA-HYDROXYBUTYRIC ACID  BETA-HYDROXYBUTYRIC ACID  BETA-HYDROXYBUTYRIC ACID  HEMOGLOBIN A1C    EKG EKG Interpretation  Date/Time:  Wednesday February 27 2021 04:54:18 EDT Ventricular Rate:  119 PR Interval:  120 QRS Duration: 91 QT Interval:  312 QTC Calculation: 439 R Axis:   84 Text Interpretation: Sinus tachycardia Probable left atrial enlargement Confirmed by Gerlene Fee [phone]) on 02/27/2021 5:27:42 AM  Radiology DG Chest 1 View  Result Date: 02/27/2021 CLINICAL DATA:  Hyperglycemia, diabetic ketoacidosis EXAM: CHEST  1 VIEW COMPARISON:  12/03/2020 FINDINGS: The heart size and mediastinal contours are within normal limits. Both lungs are clear. The visualized skeletal structures are unremarkable. IMPRESSION: No active disease. Electronically Signed   By: Fidela Salisbury MD   On: 02/27/2021 04:44    Procedures .Critical Care  Date/Time: 02/27/2021 4:36 AM Performed by: Abigail Butts,  PA-C Authorized by: Abigail Butts, PA-C   Critical care provider statement:    Critical care time (minutes):  75   Critical care time was exclusive of:  Separately billable procedures and treating other patients and teaching time   Critical care was necessary to treat or prevent imminent or life-threatening deterioration of the following conditions:  Endocrine crisis   Critical care was time spent personally by me on the following activities:  Discussions with consultants, evaluation of patient's response to treatment, examination of patient, ordering and performing treatments and interventions, ordering and review of laboratory studies, ordering and review of radiographic studies, pulse oximetry, re-evaluation of patient's condition, obtaining history from patient or surrogate and review of old charts   I assumed direction of critical care for this patient from another provider in my specialty: no     Care discussed with: admitting provider     Medications Ordered in ED Medications  insulin regular, human (MYXREDLIN) 100 units/ 100 mL infusion (7 Units/hr Intravenous New Bag/Given 02/27/21 0533)  lactated ringers infusion ( Intravenous New Bag/Given 02/27/21 0529)  dextrose 5 % in lactated ringers infusion (0 mLs Intravenous Hold 02/27/21 0526)  dextrose 50 % solution 0-50 mL (has no administration in time range)  enoxaparin (LOVENOX) injection 40 mg (has no administration in time range)  lactated ringers bolus 1,180 mL (0 mL/kg  59 kg Intravenous Stopped 02/27/21 0526)  ondansetron (ZOFRAN) injection 4 mg (4 mg Intravenous Given 02/27/21 0455)    ED Course  I have reviewed the triage vital signs and the nursing notes.  Pertinent labs & imaging results that were available during my care of the patient were reviewed by me and considered in my medical decision making (see chart for details).    MDM Rules/Calculators/A&P  Patient with a history of DKA presents  today with blood sugar greater than 600.  Acutely ill-appearing.  Labs pending.  Fluids being given.  Will initiate fluids and insulin.  4:40 AM pH 7.1   5:30 AM Glucose greater than 850, anion gap 28.  Potassium greater than 6.  AKI and dehydration.  Fluids and insulin drip running.  5:49 AM Patient is critically ill. Discussed with Dr. Alcario Drought who will admit. The patient was discussed with and evaluated by Dr. Sedonia Small who agrees with the treatment plan.   Final Clinical Impression(s) / ED Diagnoses Final diagnoses:  DKA, type 1 ([place])  Hyperkalemia  AKI (acute kidney injury) ([place])  Metabolic acidosis, increased anion gap    Rx / DC Orders ED Discharge Orders     None        Emmarae Cowdery, Gwenlyn Perking 02/27/21 0549    Maudie Flakes, MD 02/27/21 [phone]

## 2021-02-27 NOTE — ED Notes (Signed)
Nurse called ICU to allow the nurse to know that the patient is being transferred to to 223 via stretcher.

## 2021-02-27 NOTE — TOC Initial Note (Signed)
Transition of Care [facility]) - Initial/Assessment Note    Patient Details  Name: Joseph Barr MRN: [id number] Date of Birth: February 26, 1996  [facility] of [facility]) CM/SW Contact:    Golda Acre, RN Phone Number: 02/27/2021, 8:03 AM  Clinical Narrative:                 25 y.o. male with medical history significant of frequent DKA admits, Marijuana use.  Pt presents to ED with N/V ongoing for past couple of days.  Says he took 12u insulin PTA.  Unable to tolerate PO intake for past 24H.   No fevers, chills.   Symptoms are constant, severe, worsening, nothing makes better or worse.    ED Course: DKA with pH of 7.11, AG 28.  toc plan of care: Ppt lives with himself in an apt does have family in the area.  Will need to see the diabetic coordinator.  On iv insulin protocol BHA=>8.00, Bld G-714, AG=>20.0 Expected Discharge Plan: Home/Self Care Barriers to Discharge: Continued Medical Work up   Patient Goals and CMS Choice Patient states their goals for this hospitalization and ongoing recovery are:: TO GO HOME CMS Medicare.gov Compare Post Acute Care list provided to:: Patient    Expected Discharge Plan and Services Expected Discharge Plan: Home/Self Care   Discharge Planning Services: CM Consult   Living arrangements for the past 2 months: Apartment                                      Prior Living Arrangements/Services Living arrangements for the past 2 months: Apartment Lives with:: Self Patient language and need for interpreter reviewed:: Yes Do you feel safe going back to the place where you live?: Yes            Criminal Activity/Legal Involvement Pertinent to Current Situation/Hospitalization: No - Comment as needed  Activities of Daily Living Home Assistive Devices/Equipment: Eyeglasses, CBG Meter ADL Screening (condition at time of admission) Patient's cognitive ability adequate to safely complete daily activities?: Yes Is the patient deaf or have  difficulty hearing?: No Does the patient have difficulty seeing, even when wearing glasses/contacts?: No Does the patient have difficulty concentrating, remembering, or making decisions?: No Patient able to express need for assistance with ADLs?: Yes Does the patient have difficulty dressing or bathing?: No Independently performs ADLs?: Yes (appropriate for developmental age) Does the patient have difficulty walking or climbing stairs?: No Weakness of Legs: None Weakness of Arms/Hands: None  Permission Sought/Granted                  Emotional Assessment Appearance:: Appears stated age Attitude/Demeanor/Rapport: Engaged Affect (typically observed): Calm Orientation: : Oriented to Self, Oriented to Place, Oriented to  Time, Oriented to Situation Alcohol / Substance Use: Illicit Drugs Psych Involvement: No (comment)  Admission diagnosis:  Hyperkalemia [E87.5] AKI (acute kidney injury) (H[facility]) [N17.9] DKA, type 1 (H[facility]) [E10.10] Metabolic acidosis, increased anion gap [E87.2] Patient Active Problem List   Diagnosis Date Noted   Pseudohyponatremia 12/03/2020   Hypochloremia 12/03/2020   SIRS (systemic inflammatory response syndrome) ([facility]) 11/19/2019   Renal insufficiency 11/19/2019   Hyperkalemia 06/11/2017   Tobacco abuse 06/11/2017   Chest pain 06/11/2017   Increased anion gap metabolic acidosis 06/11/2017   DKA (diabetic ketoacidoses) 04/24/2015   DKA, type 1 ([facility]) 04/24/2015   AKI (acute kidney injury) ([facility]) 04/24/2015   Nausea with vomiting 12/06/2014  Diabetes mellitus type I ([facility]) 01/21/2012   Diabetes mellitus type 1 ([facility]) 01/06/2012   Hyperglycemia 01/06/2012   Ketonuria 01/06/2012   Headache(784.0) 01/06/2012   PCP:  Claiborne Rigg, NP Pharmacy:   [address] [place], [place] - [address] [place] [address] Phone: [phone] Fax: [phone]  [facility] [address] [place]  [place] [address] Phone: [phone] Fax: [phone]     Social Determinants of Health (SDOH) Interventions    Readmission Risk Interventions Readmission Risk Prevention Plan 11/21/2019  Post Dischage Appt Not Complete  Appt Comments continue to work to re-establish pt at [facility] vs alternate health clinic  Medication Screening Complete  Transportation Screening Complete  Some recent data might be hidden

## 2021-02-28 ENCOUNTER — Other Ambulatory Visit: Payer: Self-pay

## 2021-02-28 LAB — GLUCOSE, CAPILLARY
Glucose-Capillary: 134 mg/dL — ABNORMAL HIGH (ref 70–99)
Glucose-Capillary: 135 mg/dL — ABNORMAL HIGH (ref 70–99)
Glucose-Capillary: 139 mg/dL — ABNORMAL HIGH (ref 70–99)
Glucose-Capillary: 147 mg/dL — ABNORMAL HIGH (ref 70–99)
Glucose-Capillary: 152 mg/dL — ABNORMAL HIGH (ref 70–99)
Glucose-Capillary: 158 mg/dL — ABNORMAL HIGH (ref 70–99)
Glucose-Capillary: 159 mg/dL — ABNORMAL HIGH (ref 70–99)
Glucose-Capillary: 159 mg/dL — ABNORMAL HIGH (ref 70–99)
Glucose-Capillary: 177 mg/dL — ABNORMAL HIGH (ref 70–99)
Glucose-Capillary: 213 mg/dL — ABNORMAL HIGH (ref 70–99)
Glucose-Capillary: 222 mg/dL — ABNORMAL HIGH (ref 70–99)
Glucose-Capillary: 226 mg/dL — ABNORMAL HIGH (ref 70–99)
Glucose-Capillary: 229 mg/dL — ABNORMAL HIGH (ref 70–99)

## 2021-02-28 LAB — CBC WITH DIFFERENTIAL/PLATELET
Abs Immature Granulocytes: 0.06 10*3/uL (ref 0.00–0.07)
Basophils Absolute: 0.1 10*3/uL (ref 0.0–0.1)
Basophils Relative: 0 %
Eosinophils Absolute: 0.1 10*3/uL (ref 0.0–0.5)
Eosinophils Relative: 1 %
HCT: 33.4 % — ABNORMAL LOW (ref 39.0–52.0)
Hemoglobin: 11.5 g/dL — ABNORMAL LOW (ref 13.0–17.0)
Immature Granulocytes: 0 %
Lymphocytes Relative: 14 %
Lymphs Abs: 1.9 10*3/uL (ref 0.7–4.0)
MCH: 29.7 pg (ref 26.0–34.0)
MCHC: 34.4 g/dL (ref 30.0–36.0)
MCV: 86.3 fL (ref 80.0–100.0)
Monocytes Absolute: 1.1 10*3/uL — ABNORMAL HIGH (ref 0.1–1.0)
Monocytes Relative: 7 %
Neutro Abs: 11.1 10*3/uL — ABNORMAL HIGH (ref 1.7–7.7)
Neutrophils Relative %: 78 %
Platelets: 368 10*3/uL (ref 150–400)
RBC: 3.87 MIL/uL — ABNORMAL LOW (ref 4.22–5.81)
RDW: 13.6 % (ref 11.5–15.5)
WBC: 14.3 10*3/uL — ABNORMAL HIGH (ref 4.0–10.5)
nRBC: 0 % (ref 0.0–0.2)

## 2021-02-28 LAB — MAGNESIUM
Magnesium: 1.6 mg/dL — ABNORMAL LOW (ref 1.7–2.4)
Magnesium: 1.7 mg/dL (ref 1.7–2.4)

## 2021-02-28 LAB — BASIC METABOLIC PANEL
Anion gap: 9 (ref 5–15)
Anion gap: 9 (ref 5–15)
BUN: 14 mg/dL (ref 6–20)
BUN: 11 mg/dL (ref 6–20)
CO2: 24 mmol/L (ref 22–32)
CO2: 26 mmol/L (ref 22–32)
Calcium: 8.9 mg/dL (ref 8.9–10.3)
Calcium: 8.8 mg/dL — ABNORMAL LOW (ref 8.9–10.3)
Chloride: 101 mmol/L (ref 98–111)
Chloride: 99 mmol/L (ref 98–111)
Creatinine, Ser: 0.77 mg/dL (ref 0.61–1.24)
Creatinine, Ser: 0.83 mg/dL (ref 0.61–1.24)
GFR, Estimated: >60 mL/min (ref >60)
GFR, Estimated: >60 mL/min (ref >60)
Glucose, Bld: 149 mg/dL — ABNORMAL HIGH (ref 70–99)
Glucose, Bld: 157 mg/dL — ABNORMAL HIGH (ref 70–99)
Potassium: 3.3 mmol/L — ABNORMAL LOW (ref 3.5–5.1)
Potassium: 3.3 mmol/L — ABNORMAL LOW (ref 3.5–5.1)
Sodium: 134 mmol/L — ABNORMAL LOW (ref 135–145)
Sodium: 134 mmol/L — ABNORMAL LOW (ref 135–145)

## 2021-02-28 LAB — COMPREHENSIVE METABOLIC PANEL
ALT: 27 U/L (ref 0–44)
AST: 20 U/L (ref 15–41)
Albumin: 3.5 g/dL (ref 3.5–5.0)
Alkaline Phosphatase: 73 U/L (ref 38–126)
Anion gap: 9 (ref 5–15)
BUN: 14 mg/dL (ref 6–20)
CO2: 25 mmol/L (ref 22–32)
Calcium: 8.9 mg/dL (ref 8.9–10.3)
Chloride: 100 mmol/L (ref 98–111)
Creatinine, Ser: 0.85 mg/dL (ref 0.61–1.24)
GFR, Estimated: >60 mL/min (ref >60)
Glucose, Bld: 151 mg/dL — ABNORMAL HIGH (ref 70–99)
Potassium: 3.3 mmol/L — ABNORMAL LOW (ref 3.5–5.1)
Sodium: 134 mmol/L — ABNORMAL LOW (ref 135–145)
Total Bilirubin: 0.8 mg/dL (ref 0.3–1.2)
Total Protein: 6.2 g/dL — ABNORMAL LOW (ref 6.5–8.1)

## 2021-02-28 LAB — URINE CULTURE: Culture: NO GROWTH

## 2021-02-28 LAB — HEMOGLOBIN A1C
Hgb A1c MFr Bld: 13.7 % — ABNORMAL HIGH (ref 4.8–5.6)
Mean Plasma Glucose: 346 mg/dL

## 2021-02-28 LAB — BETA-HYDROXYBUTYRIC ACID
Beta-Hydroxybutyric Acid: 1.41 mmol/L — ABNORMAL HIGH (ref 0.05–0.27)
Beta-Hydroxybutyric Acid: 0.57 mmol/L — ABNORMAL HIGH (ref 0.05–0.27)

## 2021-02-28 MED ORDER — INSULIN GLARGINE 100 UNIT/ML ~~LOC~~ SOLN
24.0000 [IU] | SUBCUTANEOUS | Status: DC
  Administered 2021-02-28: 24 [IU] via SUBCUTANEOUS
  Filled 2021-02-28 (×2): qty 0.24

## 2021-02-28 MED ORDER — INSULIN ASPART 100 UNIT/ML IJ SOLN: 0.0000 [IU] | Freq: Every day | INTRAMUSCULAR | Status: DC

## 2021-02-28 MED ORDER — INSULIN ASPART 100 UNIT/ML IJ SOLN
0.0000 [IU] | Freq: Three times a day (TID) | INTRAMUSCULAR | Status: DC
  Administered 2021-02-28: 2 [IU] via SUBCUTANEOUS

## 2021-02-28 MED ORDER — POTASSIUM CHLORIDE CRYS ER 20 MEQ PO TBCR
40.0000 meq | EXTENDED_RELEASE_TABLET | Freq: Once | ORAL | Status: AC
  Administered 2021-02-28: 40 meq via ORAL
  Filled 2021-02-28: qty 2

## 2021-02-28 MED ORDER — ONDANSETRON HCL 4 MG PO TABS
4.0000 mg | ORAL_TABLET | Freq: Three times a day (TID) | ORAL | 0 refills | Status: DC | PRN
  Filled 2021-02-28: qty 20, 7d supply, fill #0

## 2021-02-28 NOTE — Discharge Summary (Signed)
Physician Discharge Summary  Joseph Barr ZOX:[id number] DOB: [birth year] DOA: 02/27/2021  PCP: [name], NP  Admit date: 02/27/2021 Discharge date: 02/28/2021  Admitted From: Home Disposition: Home  Recommendations for Outpatient Follow-up:  Follow up with PCP in 1 week with repeat CBC/BMP Comply with medications and follow-up Outpatient evaluation and follow-up by endocrinology Follow up in ED if symptoms worsen or new appear   Home Health: No Equipment/Devices: None  Discharge Condition: Stable CODE STATUS: Full Diet recommendation: Carb modified  Brief/Interim Summary: 25 year old male with history of diabetes mellitus type 1 with frequent hospitalizations for DKA, marijuana use presented with hyperglycemia and nausea and vomiting.  He was found to have DKA and started on insulin drip and IV fluids.  During the hospitalization, anion gap is closed.  He has been transitioned to long-acting insulin.  Diet has been advanced.  He has tolerated diet, he will be discharged home with outpatient follow-up with PCP.  Discharge Diagnoses:   DKA in a patient with history of diabetes mellitus type 1 uncontrolled with hyperglycemia -Treated with IV fluids and insulin drip.  Anion gap is closed. -He has been transitioned to long-acting insulin.  Diet has been advanced.  He has tolerated diet, he will be discharged home with outpatient follow-up with PCP. -Continue long-acting insulin upon discharge. -A1c 13.7 -Outpatient follow-up with endocrinology  AKI -Resolved  Hypokalemia -Replace prior to discharge  Mild hyponatremia -Outpatient follow-up  Leukocytosis -Possibly reactive  Thrombocytosis -Resolved   Discharge Instructions   Allergies as of 02/28/2021   No Known Allergies      Medication List     STOP taking these medications    atorvastatin 20 MG tablet Commonly known as: LIPITOR   lisinopril 5 MG tablet Commonly known as: ZESTRIL       TAKE  these medications    glucose blood test strip Commonly known as: Accu-Chek Aviva 1 each by Other route 4 (four) times daily. And lancets 250.03   True Metrix Blood Glucose Test test strip Generic drug: glucose blood Use as instructed   Insulin Pen Needle 31G X 5 MM Misc Use as directed in the morning, at noon, and at bedtime.   Lantus SoloStar 100 UNIT/ML Solostar Pen Generic drug: insulin glargine Inject 36 Units into the skin daily.   NovoLOG FlexPen 100 UNIT/ML FlexPen Generic drug: insulin aspart Inject 12-20 Units into the skin 3 (three) times daily with meals. Sliding scale   ondansetron 4 MG tablet Commonly known as: Zofran Take 1 tablet (4 mg total) by mouth every 8 (eight) hours as needed for nausea or vomiting.   pantoprazole 40 MG tablet Commonly known as: Protonix Take 1 tablet (40 mg total) by mouth daily. What changed: how much to take   True Metrix Meter w/Device Kit Use as directed   TRUEplus Lancets 28G Misc Use as directed         No Known Allergies  Consultations: None   Procedures/Studies: DG Chest 1 View  Result Date: 02/27/2021 CLINICAL DATA:  Hyperglycemia, diabetic ketoacidosis EXAM: CHEST  1 VIEW COMPARISON:  12/03/2020 FINDINGS: The heart size and mediastinal contours are within normal limits. Both lungs are clear. The visualized skeletal structures are unremarkable. IMPRESSION: No active disease. Electronically Signed   By: Fidela Salisbury MD   On: 02/27/2021 04:44      Subjective: Patient seen and examined at bedside.  He feels better and wants to eat.  Denies current nausea or vomiting.  No overnight fever or  shortness of breath reported. Discharge Exam: Vitals:   02/28/21 0758 02/28/21 0800  BP:    Pulse:  (!) 102  Resp:  16  Temp: 98.1 F (36.7 C)   SpO2:  97%    General: Pt is alert, awake, not in acute distress.  Young male, extremely thinly built. Cardiovascular: rate controlled, S1/S2 + Respiratory: bilateral  decreased breath sounds at bases Abdominal: Soft, NT, ND, bowel sounds + Extremities: no edema, no cyanosis    The results of significant diagnostics from this hospitalization (including imaging, microbiology, ancillary and laboratory) are listed below for reference.     Microbiology: Recent Results (from the past 240 hour(s))  SARS CORONAVIRUS 2 (TAT 6-24 HRS) Nasopharyngeal Nasopharyngeal Swab     Status: None   Collection Time: 02/27/21  4:17 AM   Specimen: Nasopharyngeal Swab  Result Value Ref Range Status   SARS Coronavirus 2 NEGATIVE NEGATIVE Final    Comment: (NOTE) SARS-CoV-2 target nucleic acids are NOT DETECTED.  The SARS-CoV-2 RNA is generally detectable in upper and lower respiratory specimens during the acute phase of infection. Negative results do not preclude SARS-CoV-2 infection, do not rule out co-infections with other pathogens, and should not be used as the sole basis for treatment or other patient management decisions. Negative results must be combined with clinical observations, patient history, and epidemiological information. The expected result is Negative.  Fact Sheet for Patients: SugarRoll.be  Fact Sheet for Healthcare Providers: https://www.woods-mathews.com/  This test is not yet approved or cleared by the [place] FDA and  has been authorized for detection and/or diagnosis of SARS-CoV-2 by FDA under an Emergency Use Authorization (EUA). This EUA will remain  in effect (meaning this test can be used) for the duration of the COVID-19 declaration under Se ction 564(b)(1) of the Act, 21 U.S.C. section 360bbb-3(b)(1), unless the authorization is terminated or revoked sooner.  Performed at [facility], [place] [address] [place], [place] [address]   Urine culture     Status: None   Collection Time: 02/27/21  6:09 AM   Specimen: Urine, Random  Result Value Ref Range Status   Specimen Description    Final    URINE, RANDOM Performed at [facility] [address], [place], [place] [address]    Special Requests   Final    NONE Performed at [facility], [place] [address] [place], [place] [address]    Culture   Final    NO GROWTH Performed at [facility], [place] [address] [place], [place] [address]    Report Status 02/28/2021 FINAL  Final  MRSA PCR Screening     Status: None   Collection Time: 02/27/21  7:52 AM  Result Value Ref Range Status   MRSA by PCR NEGATIVE NEGATIVE Final    Comment:        The GeneXpert MRSA Assay (FDA approved for NASAL specimens only), is one component of a comprehensive MRSA colonization surveillance program. It is not intended to diagnose MRSA infection nor to guide or monitor treatment for MRSA infections. Performed at [facility], [place] [address] [place],  [address]      Labs: BNP (last 3 results) No results for input(s): BNP in the last 8760 hours. Basic Metabolic Panel: Recent Labs  Lab 02/27/21 1155 02/27/21 1658 02/27/21 2053 02/28/21 0105 02/28/21 0647  NA 137 139 134* 134*  134* 134*  K 4.3 4.3 3.7 3.3*  3.3* 3.3*  CL  103 105 100 100  101 99  CO2 18* 20* _0 GLUCOSE 216* 165* 144* 151*  149* 157*  BUN 22* _1 CREATININE 1.08 0.98 0.99 0.85  0.77 0.83  CALCIUM 9.4 9.3 8.6* 8.9  8.9 8.8*  MG  --   --   --  1.6*  1.7  --    Liver Function Tests: Recent Labs  Lab 02/27/21 0417 02/28/21 0105  AST 46* 20  ALT 46* 27  ALKPHOS 108 73  BILITOT 1.7* 0.8  PROT 8.2* 6.2*  ALBUMIN 4.6 3.5   Recent Labs  Lab 02/27/21 0417  LIPASE 18   No results for input(s): AMMONIA in the last 168 hours. CBC: Recent Labs  Lab 02/27/21 0417 02/28/21 0105  WBC 13.1* 14.3*  NEUTROABS 9.9* 11.1*  HGB 14.7 11.5*  HCT 45.9 33.4*  MCV 92.0 86.3  PLT 487* 368   Cardiac Enzymes: No results for input(s): CKTOTAL, CKMB, CKMBINDEX,  TROPONINI in the last 168 hours. BNP: Invalid input(s): POCBNP CBG: Recent Labs  Lab 02/28/21 0605 02/28/21 0716 02/28/21 0850 02/28/21 0954 02/28/21 0958  GLUCAP 134* 158* 152* 213* 222*   D-Dimer No results for input(s): DDIMER in the last 72 hours. Hgb A1c Recent Labs    02/27/21 0609  HGBA1C 13.7*   Lipid Profile No results for input(s): CHOL, HDL, LDLCALC, TRIG, CHOLHDL, LDLDIRECT in the last 72 hours. Thyroid function studies No results for input(s): TSH, T4TOTAL, T3FREE, THYROIDAB in the last 72 hours.  Invalid input(s): FREET3 Anemia work up No results for input(s): VITAMINB12, FOLATE, FERRITIN, TIBC, IRON, RETICCTPCT in the last 72 hours. Urinalysis    Component Value Date/Time   COLORURINE COLORLESS (A) 02/27/2021 0609   APPEARANCEUR CLEAR 02/27/2021 0609   LABSPEC 1.022 02/27/2021 0609   PHURINE 6.0 02/27/2021 0609   GLUCOSEU >=500 (A) 02/27/2021 0609   HGBUR NEGATIVE 02/27/2021 0609   BILIRUBINUR NEGATIVE 02/27/2021 0609   BILIRUBINUR Negative 08/01/2016 0950   KETONESUR 80 (A) 02/27/2021 0609   PROTEINUR NEGATIVE 02/27/2021 0609   UROBILINOGEN 0.2 08/01/2016 0950   UROBILINOGEN 0.2 04/24/2015 1305   NITRITE NEGATIVE 02/27/2021 0609   LEUKOCYTESUR NEGATIVE 02/27/2021 0609   Sepsis Labs Invalid input(s): PROCALCITONIN,  WBC,  LACTICIDVEN Microbiology Recent Results (from the past 240 hour(s))  SARS CORONAVIRUS 2 (TAT 6-24 HRS) Nasopharyngeal Nasopharyngeal Swab     Status: None   Collection Time: 02/27/21  4:17 AM   Specimen: Nasopharyngeal Swab  Result Value Ref Range Status   SARS Coronavirus 2 NEGATIVE NEGATIVE Final    Comment: (NOTE) SARS-CoV-2 target nucleic acids are NOT DETECTED.  The SARS-CoV-2 RNA is generally detectable in upper and lower respiratory specimens during the acute phase of infection. Negative results do not preclude SARS-CoV-2 infection, do not rule out co-infections with other pathogens, and should not be used as  the sole basis for treatment or other patient management decisions. Negative results must be combined with clinical observations, patient history, and epidemiological information. The expected result is Negative.  Fact Sheet for Patients: SugarRoll.be  Fact Sheet for Healthcare Providers: https://www.woods-mathews.com/  This test is not yet approved or cleared by the [place] FDA and  has been authorized for detection and/or diagnosis of SARS-CoV-2 by FDA under an Emergency Use Authorization (EUA). This EUA will remain  in effect (meaning this test can be used) for the duration of the COVID-19 declaration under Se ction 564(b)(1) of the Act, 21 U.S.C.  section 360bbb-3(b)(1), unless the authorization is terminated or revoked sooner.  Performed at [facility], [place] [address], [place], [place] [address]   Urine culture     Status: None   Collection Time: 02/27/21  6:09 AM   Specimen: Urine, Random  Result Value Ref Range Status   Specimen Description   Final    URINE, RANDOM Performed at [facility] [address] [place], [place] [address]    Special Requests   Final    NONE Performed at [facility], [place] [address], [place], [place] [address]    Culture   Final    NO GROWTH Performed at [facility], [place] [address] [place], [place] [address]    Report Status 02/28/2021 FINAL  Final  MRSA PCR Screening     Status: None   Collection Time: 02/27/21  7:52 AM  Result Value Ref Range Status   MRSA by PCR NEGATIVE NEGATIVE Final    Comment:        The GeneXpert MRSA Assay (FDA approved for NASAL specimens only), is one component of a comprehensive MRSA colonization surveillance program. It is not intended to diagnose MRSA infection nor to guide or monitor treatment for MRSA infections. Performed at [facility], [place] [address] [place], [place]  [address]      Time coordinating discharge: 35 minutes  SIGNED:   Aline August, MD  [facility] 02/28/2021, 10:58 AM

## 2021-03-01 ENCOUNTER — Telehealth: Payer: Self-pay

## 2021-03-01 NOTE — Telephone Encounter (Signed)
Transition Care Management Unsuccessful Follow-up Telephone Call  Date of discharge and from where:  [facility] on 06.30.2022  Attempts:  1st Attempt  Reason for unsuccessful TCM follow-up call:  Left voice message unable to reach pt at [phone].   Pt have scheduled appt on 03/22/2021 with PCP.

## 2021-03-05 ENCOUNTER — Telehealth: Payer: Self-pay

## 2021-03-05 NOTE — Telephone Encounter (Signed)
Transition Care Management Unsuccessful Follow-up Telephone Call  Date of discharge and from where:  02/28/2021 [facility]   Attempts:  2nd Attempt  Reason for unsuccessful TCM follow-up call:  Left voice message on # [phone]

## 2021-03-06 ENCOUNTER — Telehealth: Payer: Self-pay

## 2021-03-06 NOTE — Telephone Encounter (Signed)
Transition Care Management Unsuccessful Follow-up Telephone Call  Date of discharge and from where:  02/28/2021, [facility]   Attempts:  3rd Attempt  Reason for unsuccessful TCM follow-up call:  Left voice message on # [phone]  Patient has an appointment with Bertram Denver, NP on 03/22/2021.

## 2021-03-07 ENCOUNTER — Other Ambulatory Visit: Payer: Self-pay

## 2021-03-22 ENCOUNTER — Ambulatory Visit: Payer: Self-pay | Admitting: Nurse Practitioner

## 2021-04-22 ENCOUNTER — Other Ambulatory Visit: Payer: Self-pay

## 2021-06-05 ENCOUNTER — Inpatient Hospital Stay (HOSPITAL_COMMUNITY)
Admission: EM | Admit: 2021-06-05 | Discharge: 2021-06-10 | DRG: 603 | Disposition: A | Discharge status: Home / Self Care | Payer: Self-pay | Attending: Internal Medicine | Admitting: Internal Medicine

## 2021-06-05 ENCOUNTER — Emergency Department (HOSPITAL_COMMUNITY): Payer: Self-pay

## 2021-06-05 ENCOUNTER — Encounter (HOSPITAL_COMMUNITY): Payer: Self-pay

## 2021-06-05 ENCOUNTER — Other Ambulatory Visit: Payer: Self-pay

## 2021-06-05 DIAGNOSIS — E1165 Type 2 diabetes mellitus with hyperglycemia: Secondary | ICD-10-CM | POA: Diagnosis present

## 2021-06-05 DIAGNOSIS — E876 Hypokalemia: Secondary | ICD-10-CM | POA: Diagnosis present

## 2021-06-05 DIAGNOSIS — Z87891 Personal history of nicotine dependence: Secondary | ICD-10-CM

## 2021-06-05 DIAGNOSIS — R739 Hyperglycemia, unspecified: Secondary | ICD-10-CM

## 2021-06-05 DIAGNOSIS — E871 Hypo-osmolality and hyponatremia: Secondary | ICD-10-CM | POA: Diagnosis present

## 2021-06-05 DIAGNOSIS — Z794 Long term (current) use of insulin: Secondary | ICD-10-CM

## 2021-06-05 DIAGNOSIS — L03116 Cellulitis of left lower limb: Principal | ICD-10-CM | POA: Diagnosis present

## 2021-06-05 DIAGNOSIS — L0390 Cellulitis, unspecified: Secondary | ICD-10-CM | POA: Diagnosis present

## 2021-06-05 DIAGNOSIS — Z20822 Contact with and (suspected) exposure to covid-19: Secondary | ICD-10-CM | POA: Diagnosis present

## 2021-06-05 DIAGNOSIS — E1065 Type 1 diabetes mellitus with hyperglycemia: Secondary | ICD-10-CM | POA: Diagnosis present

## 2021-06-05 LAB — CBC WITH DIFFERENTIAL/PLATELET
Abs Immature Granulocytes: 0.06 10*3/uL (ref 0.00–0.07)
Basophils Absolute: 0.1 10*3/uL (ref 0.0–0.1)
Basophils Relative: 1 %
Eosinophils Absolute: 0.1 10*3/uL (ref 0.0–0.5)
Eosinophils Relative: 1 %
HCT: 39 % (ref 39.0–52.0)
Hemoglobin: 13.6 g/dL (ref 13.0–17.0)
Immature Granulocytes: 0 %
Lymphocytes Relative: 16 %
Lymphs Abs: 2.3 10*3/uL (ref 0.7–4.0)
MCH: 29.4 pg (ref 26.0–34.0)
MCHC: 34.9 g/dL (ref 30.0–36.0)
MCV: 84.4 fL (ref 80.0–100.0)
Monocytes Absolute: 1 10*3/uL (ref 0.1–1.0)
Monocytes Relative: 7 %
Neutro Abs: 10.9 10*3/uL — ABNORMAL HIGH (ref 1.7–7.7)
Neutrophils Relative %: 75 %
Platelets: 367 10*3/uL (ref 150–400)
RBC: 4.62 MIL/uL (ref 4.22–5.81)
RDW: 11.5 % (ref 11.5–15.5)
WBC: 14.4 10*3/uL — ABNORMAL HIGH (ref 4.0–10.5)
nRBC: 0 % (ref 0.0–0.2)

## 2021-06-05 LAB — BETA-HYDROXYBUTYRIC ACID: Beta-Hydroxybutyric Acid: 0.48 mmol/L — ABNORMAL HIGH (ref 0.05–0.27)

## 2021-06-05 LAB — BLOOD GAS, VENOUS
Acid-Base Excess: 4.1 mmol/L — ABNORMAL HIGH (ref 0.0–2.0)
Bicarbonate: 30.1 mmol/L — ABNORMAL HIGH (ref 20.0–28.0)
O2 Saturation: 15.4 %
Patient temperature: 98.6
pCO2, Ven: 52.9 mmHg (ref 44.0–60.0)
pH, Ven: 7.373 (ref 7.250–7.430)
pO2, Ven: <31 mmHg — CL (ref 32.0–45.0)

## 2021-06-05 LAB — COMPREHENSIVE METABOLIC PANEL
ALT: 13 U/L (ref 0–44)
AST: 16 U/L (ref 15–41)
Albumin: 3.9 g/dL (ref 3.5–5.0)
Alkaline Phosphatase: 85 U/L (ref 38–126)
Anion gap: 9 (ref 5–15)
BUN: 13 mg/dL (ref 6–20)
CO2: 26 mmol/L (ref 22–32)
Calcium: 9.2 mg/dL (ref 8.9–10.3)
Chloride: 90 mmol/L — ABNORMAL LOW (ref 98–111)
Creatinine, Ser: 1.1 mg/dL (ref 0.61–1.24)
GFR, Estimated: >60 mL/min (ref >60)
Glucose, Bld: 537 mg/dL (ref 70–99)
Potassium: 3.8 mmol/L (ref 3.5–5.1)
Sodium: 125 mmol/L — ABNORMAL LOW (ref 135–145)
Total Bilirubin: 0.6 mg/dL (ref 0.3–1.2)
Total Protein: 7.8 g/dL (ref 6.5–8.1)

## 2021-06-05 LAB — LACTIC ACID, PLASMA
Lactic Acid, Venous: 2.4 mmol/L (ref 0.5–1.9)
Lactic Acid, Venous: 1.9 mmol/L (ref 0.5–1.9)

## 2021-06-05 LAB — SEDIMENTATION RATE: Sed Rate: 44 mm/hr — ABNORMAL HIGH (ref 0–16)

## 2021-06-05 LAB — CBG MONITORING, ED: Glucose-Capillary: 538 mg/dL (ref 70–99)

## 2021-06-05 MED ORDER — ACETAMINOPHEN 325 MG PO TABS
650.0000 mg | ORAL_TABLET | ORAL | Status: DC | PRN
  Administered 2021-06-05: 650 mg via ORAL
  Filled 2021-06-05: qty 2

## 2021-06-05 MED ORDER — VANCOMYCIN HCL IN DEXTROSE 1-5 GM/200ML-% IV SOLN
1000.0000 mg | Freq: Once | INTRAVENOUS | Status: AC
  Administered 2021-06-05: 1000 mg via INTRAVENOUS
  Filled 2021-06-05: qty 200

## 2021-06-05 MED ORDER — LACTATED RINGERS IV BOLUS
2000.0000 mL | Freq: Once | INTRAVENOUS | Status: AC
  Administered 2021-06-05: 2000 mL via INTRAVENOUS

## 2021-06-05 MED ORDER — DOXYCYCLINE HYCLATE 100 MG PO TABS
100.0000 mg | ORAL_TABLET | Freq: Once | ORAL | Status: AC
  Administered 2021-06-05: 100 mg via ORAL
  Filled 2021-06-05: qty 1

## 2021-06-05 MED ORDER — CEFEPIME HCL 2 G IJ SOLR
2.0000 g | Freq: Once | INTRAMUSCULAR | Status: AC
  Administered 2021-06-05: 2 g via INTRAVENOUS
  Filled 2021-06-05: qty 2

## 2021-06-05 MED ORDER — DOXYCYCLINE HYCLATE 100 MG PO CAPS: 100.0000 mg | ORAL_CAPSULE | Freq: Two times a day (BID) | ORAL | 0 refills | Status: DC

## 2021-06-05 MED ORDER — HYDROMORPHONE HCL 1 MG/ML IJ SOLN
1.0000 mg | Freq: Once | INTRAMUSCULAR | Status: AC
  Administered 2021-06-05: 1 mg via INTRAVENOUS
  Filled 2021-06-05: qty 1

## 2021-06-05 NOTE — ED Provider Notes (Signed)
[facility] DEPT Provider Note   CSN: [id number] Arrival date & time: 06/05/21  1956     History Chief Complaint  Patient presents with   Tattoo issue     Joseph Barr is a 25 y.o. male.  HPI Patient presents for pain and swelling to his distal left lower extremity.  3 weeks ago, he got a tattoo on his lateral distal left lower extremity.  This was performed in a tattoo shop under sterile conditions.  Yesterday, he noticed some pustules around the area.  He did pop 1 of these.  He developed pain and swelling to the distal aspect of his left lower leg that extended into his ankle and foot.  This area of swelling and pain worsened today.  For this reason, he presents to the ED.  He denies any subjective fevers.  He has had chills and nausea.  He has not vomited.  His medical history is notable for type 1 diabetes.  He takes long-acting insulin at night and did take his dose last night.  His last dose of short acting insulin was today at lunchtime.  He has had polyuria.  Currently, he endorses severe pain in the area of his distal left leg.    Past Medical History:  Diagnosis Date   Diabetes mellitus     Patient Active Problem List   Diagnosis Date Noted   Pseudohyponatremia 12/03/2020   Hypochloremia 12/03/2020   SIRS (systemic inflammatory response syndrome) ([place]) 11/19/2019   Renal insufficiency 11/19/2019   Hyperkalemia 06/11/2017   Tobacco abuse 06/11/2017   Chest pain 06/11/2017   Increased anion gap metabolic acidosis 16/96/7893   DKA (diabetic ketoacidoses) 04/24/2015   DKA, type 1 ([place]) 04/24/2015   AKI (acute kidney injury) ([place]) 04/24/2015   Nausea with vomiting 12/06/2014   Diabetes mellitus type I ([place]) 01/21/2012   Diabetes mellitus type 1 ([place]) 01/06/2012   Hyperglycemia 01/06/2012   Ketonuria 01/06/2012   Headache(784.0) 01/06/2012    History reviewed. No pertinent surgical history.     Family History  Problem Relation  Age of Onset   Cancer Maternal Grandfather    Hypertension Mother    Diabetes Neg Hx     Social History   Tobacco Use   Smoking status: Former    Types: E-cigarettes   Smokeless tobacco: Former  Scientific laboratory technician Use: Former  Substance Use Topics   Alcohol use: No   Drug use: Yes    Types: Marijuana    Home Medications Prior to Admission medications   Medication Sig Start Date End Date Taking? Authorizing Provider  Blood Glucose Monitoring Suppl (TRUE METRIX METER) w/Device KIT Use as directed 12/21/20   Camillia Herter, NP  glucose blood (ACCU-CHEK AVIVA) test strip 1 each by Other route 4 (four) times daily. And lancets 250.03 06/26/14   Renato Shin, MD  glucose blood (TRUE METRIX BLOOD GLUCOSE TEST) test strip Use as instructed 01/08/21   Camillia Herter, NP  insulin aspart (NOVOLOG FLEXPEN) 100 UNIT/ML FlexPen Inject 12-20 Units into the skin 3 (three) times daily with meals. Sliding scale 12/21/20   Camillia Herter, NP  insulin glargine (LANTUS SOLOSTAR) 100 UNIT/ML Solostar Pen Inject 36 Units into the skin daily. 12/21/20 06/11/21  Camillia Herter, NP  Insulin Pen Needle 31G X 5 MM MISC Use as directed in the morning, at noon, and at bedtime. 04/09/20   [name], NP  ondansetron (ZOFRAN) 4 MG tablet Take  1 tablet (4 mg total) by mouth every 8 (eight) hours as needed for nausea or vomiting. 02/28/21   Aline August, MD  pantoprazole (PROTONIX) 40 MG tablet Take 1 tablet (40 mg total) by mouth daily. Patient taking differently: Take 20 mg by mouth daily. 04/09/20 02/27/21  [name], NP  TRUEplus Lancets 28G MISC Use as directed 01/08/21   Camillia Herter, NP    Allergies    Patient has no known allergies.  Review of Systems   Review of Systems  Constitutional:  Positive for appetite change and chills. Negative for fatigue and fever.  HENT:  Negative for congestion, ear pain and sore throat.   Eyes:  Negative for pain and visual disturbance.  Respiratory:   Negative for cough, chest tightness and shortness of breath.   Cardiovascular:  Negative for chest pain and palpitations.  Gastrointestinal:  Positive for nausea. Negative for abdominal pain, diarrhea and vomiting.  Endocrine: Positive for polyuria.  Genitourinary:  Negative for dysuria, flank pain and hematuria.  Musculoskeletal:  Negative for arthralgias and back pain.  Skin:  Positive for color change and wound. Negative for rash.  Neurological:  Negative for dizziness, seizures, syncope, weakness, light-headedness and headaches.  Hematological:  Does not bruise/bleed easily.  All other systems reviewed and are negative.  Physical Exam Updated Vital Signs BP 107/66 (BP Location: Left Arm)   Pulse (!) 122   Temp 99.8 F (37.7 C) (Oral)   Resp 18   SpO2 99%   Physical Exam Vitals and nursing note reviewed.  Constitutional:      General: He is not in acute distress.    Appearance: Normal appearance. He is well-developed. He is not ill-appearing, toxic-appearing or diaphoretic.  HENT:     Head: Normocephalic and atraumatic.     Right Ear: External ear normal.     Left Ear: External ear normal.     Nose: Nose normal.  Eyes:     General: No scleral icterus.    Extraocular Movements: Extraocular movements intact.     Conjunctiva/sclera: Conjunctivae normal.  Cardiovascular:     Rate and Rhythm: Regular rhythm. Tachycardia present.     Heart sounds: No murmur heard. Pulmonary:     Effort: Pulmonary effort is normal. No respiratory distress.     Breath sounds: Normal breath sounds. No wheezing or rales.  Abdominal:     Palpations: Abdomen is soft.     Tenderness: There is no abdominal tenderness.  Musculoskeletal:        General: Swelling and tenderness present.     Cervical back: Normal range of motion and neck supple.  Skin:    General: Skin is warm and dry.     Findings: Erythema present.  Neurological:     General: No focal deficit present.     Mental Status: He is  alert and oriented to person, place, and time.  Psychiatric:        Mood and Affect: Mood normal.        Behavior: Behavior normal.    ED Results / Procedures / Treatments   Labs (all labs ordered are listed, but only abnormal results are displayed) Labs Reviewed  COMPREHENSIVE METABOLIC PANEL - Abnormal; Notable for the following components:      Result Value   Sodium 125 (*)    Chloride 90 (*)    Glucose, Bld 537 (*)    All other components within normal limits  CBC WITH DIFFERENTIAL/PLATELET - Abnormal; Notable for the  following components:   WBC 14.4 (*)    Neutro Abs 10.9 (*)    All other components within normal limits  LACTIC ACID, PLASMA - Abnormal; Notable for the following components:   Lactic Acid, Venous 2.4 (*)    All other components within normal limits  BLOOD GAS, VENOUS - Abnormal; Notable for the following components:   pO2, Ven <31.0 (*)    Bicarbonate 30.1 (*)    Acid-Base Excess 4.1 (*)    All other components within normal limits  BETA-HYDROXYBUTYRIC ACID - Abnormal; Notable for the following components:   Beta-Hydroxybutyric Acid 0.48 (*)    All other components within normal limits  CBG MONITORING, ED - Abnormal; Notable for the following components:   Glucose-Capillary 538 (*)    All other components within normal limits  LACTIC ACID, PLASMA  URINALYSIS, ROUTINE W REFLEX MICROSCOPIC    EKG None  Radiology No results found.  Procedures Procedures   Medications Ordered in ED Medications  doxycycline (VIBRA-TABS) tablet 100 mg (100 mg Oral Given 06/05/21 2040)    ED Course  I have reviewed the triage vital signs and the nursing notes.  Pertinent labs & imaging results that were available during my care of the patient were reviewed by me and considered in my medical decision making (see chart for details).    MDM Rules/Calculators/A&P                         CRITICAL CARE Performed by: Godfrey Pick   Total critical care time: 35  minutes  Critical care time was exclusive of separately billable procedures and treating other patients.  Critical care was necessary to treat or prevent imminent or life-threatening deterioration.  Critical care was time spent personally by me on the following activities: development of treatment plan with patient and/or surrogate as well as nursing, discussions with consultants, evaluation of patient's response to treatment, examination of patient, obtaining history from patient or surrogate, ordering and performing treatments and interventions, ordering and review of laboratory studies, ordering and review of radiographic studies, pulse oximetry and re-evaluation of patient's condition.   Patient presents for severe skin/soft tissue infection of his distal left lower extremity.  Prior to being bedded in the ED, lab work was obtained.  Lab work was notable for leukocytosis, hyperglycemia, and lactic acidosis.  On assessment, patient is alert and oriented.  He remains tachycardic in the range of 120s.  Area of concern does have swelling, erythema, and tenderness.  No crepitus is appreciated.  There is no evidence of purulent drainage, although patient does state that he did not notice some drainage earlier today.  Additional lab work was ordered.  2 L of IVF was ordered.  Dilaudid was ordered for analgesia.  Patient was started on broad-spectrum antibiotics.  X-ray imaging did not show any subcutaneous gas.  Patient will require admission for monitoring.  Patient was admitted to hospitalist for ongoing care.  Final Clinical Impression(s) / ED Diagnoses Final diagnoses:  Cellulitis of left lower extremity    Rx / DC Orders ED Discharge Orders          Ordered    doxycycline (VIBRAMYCIN) 100 MG capsule  2 times daily,   Status:  Discontinued        06/05/21 2030             Godfrey Pick, MD 06/06/21 1347

## 2021-06-05 NOTE — ED Notes (Signed)
x2 sets of BC drawn with labs

## 2021-06-05 NOTE — ED Triage Notes (Signed)
Pt complains of a chemical burn to his tattoo to his left calf. Pt states that he uses a numbing spray on his tattoo and it had pustules on it afterwards. Pt complains of numbness and pain to his left leg from the knee down. Pt reports having type 1 diabetes.

## 2021-06-05 NOTE — ED Notes (Signed)
Per Lab gold top tube needed for analysis. RN advised. [employer]

## 2021-06-05 NOTE — ED Provider Notes (Signed)
Emergency Medicine Provider Triage Evaluation Note  Patient is a 25 year old male who presents to the emergency department due to pain, redness, and swelling to the left lower leg.  Patient states that about 2 weeks ago he got a tattoo in the region.  He then began developing his prior mentioned symptoms.  Reports fatigue today but denies any fevers, nausea, vomiting.  Also complains of tingling in the left lower leg.  Physical Exam  BP 107/66 (BP Location: Left Arm)   Pulse (!) 122   Temp 99.8 F (37.7 C) (Oral)   Resp 18   SpO2 99%  Gen:   Awake, no distress   Resp:  Normal effort  MSK:   Moves extremities without difficulty  Other:  Developing cellulitis in the left lower extremity with associated pitting edema.  2+ pedal pulses.  Medical Decision Making  Medically screening exam initiated at 8:35 PM.  Appropriate orders placed.  NICKO DAHER was informed that the remainder of the evaluation will be completed by another provider, this initial triage assessment does not replace that evaluation, and the importance of remaining in the ED until their evaluation is complete.   Placido Sou, PA-C 06/05/21 2036    Cheryll Cockayne, MD 06/07/21 1529

## 2021-06-06 ENCOUNTER — Observation Stay (HOSPITAL_COMMUNITY): Payer: Self-pay

## 2021-06-06 ENCOUNTER — Encounter (HOSPITAL_COMMUNITY): Payer: Self-pay | Admitting: Family Medicine

## 2021-06-06 DIAGNOSIS — L03116 Cellulitis of left lower limb: Principal | ICD-10-CM

## 2021-06-06 DIAGNOSIS — E1065 Type 1 diabetes mellitus with hyperglycemia: Secondary | ICD-10-CM

## 2021-06-06 DIAGNOSIS — L0390 Cellulitis, unspecified: Secondary | ICD-10-CM | POA: Diagnosis present

## 2021-06-06 LAB — GLUCOSE, CAPILLARY
Glucose-Capillary: 198 mg/dL — ABNORMAL HIGH (ref 70–99)
Glucose-Capillary: 130 mg/dL — ABNORMAL HIGH (ref 70–99)

## 2021-06-06 LAB — RESP PANEL BY RT-PCR (FLU A&B, COVID) ARPGX2
Influenza A by PCR: NEGATIVE
Influenza B by PCR: NEGATIVE
SARS Coronavirus 2 by RT PCR: NEGATIVE

## 2021-06-06 LAB — CBC
HCT: 35.3 % — ABNORMAL LOW (ref 39.0–52.0)
Hemoglobin: 12.4 g/dL — ABNORMAL LOW (ref 13.0–17.0)
MCH: 29.7 pg (ref 26.0–34.0)
MCHC: 35.1 g/dL (ref 30.0–36.0)
MCV: 84.4 fL (ref 80.0–100.0)
Platelets: 316 10*3/uL (ref 150–400)
RBC: 4.18 MIL/uL — ABNORMAL LOW (ref 4.22–5.81)
RDW: 11.4 % — ABNORMAL LOW (ref 11.5–15.5)
WBC: 12.4 10*3/uL — ABNORMAL HIGH (ref 4.0–10.5)
nRBC: 0 % (ref 0.0–0.2)

## 2021-06-06 LAB — URINALYSIS, ROUTINE W REFLEX MICROSCOPIC
Bacteria, UA: NONE SEEN
Bilirubin Urine: NEGATIVE
Glucose, UA: >=500 mg/dL — AB
Hgb urine dipstick: NEGATIVE
Ketones, ur: 5 mg/dL — AB
Leukocytes,Ua: NEGATIVE
Nitrite: NEGATIVE
Protein, ur: NEGATIVE mg/dL
Specific Gravity, Urine: 1.037 — ABNORMAL HIGH (ref 1.005–1.030)
pH: 6 (ref 5.0–8.0)

## 2021-06-06 LAB — SEDIMENTATION RATE: Sed Rate: 47 mm/hr — ABNORMAL HIGH (ref 0–16)

## 2021-06-06 LAB — BASIC METABOLIC PANEL
Anion gap: 6 (ref 5–15)
BUN: 10 mg/dL (ref 6–20)
CO2: 30 mmol/L (ref 22–32)
Calcium: 9.2 mg/dL (ref 8.9–10.3)
Chloride: 103 mmol/L (ref 98–111)
Creatinine, Ser: 0.76 mg/dL (ref 0.61–1.24)
GFR, Estimated: >60 mL/min (ref >60)
Glucose, Bld: 153 mg/dL — ABNORMAL HIGH (ref 70–99)
Potassium: 3.8 mmol/L (ref 3.5–5.1)
Sodium: 139 mmol/L (ref 135–145)

## 2021-06-06 LAB — CBG MONITORING, ED
Glucose-Capillary: 234 mg/dL — ABNORMAL HIGH (ref 70–99)
Glucose-Capillary: 62 mg/dL — ABNORMAL LOW (ref 70–99)
Glucose-Capillary: 144 mg/dL — ABNORMAL HIGH (ref 70–99)
Glucose-Capillary: 196 mg/dL — ABNORMAL HIGH (ref 70–99)
Glucose-Capillary: 190 mg/dL — ABNORMAL HIGH (ref 70–99)

## 2021-06-06 LAB — HEMOGLOBIN A1C
Hgb A1c MFr Bld: 14.7 % — ABNORMAL HIGH (ref 4.8–5.6)
Mean Plasma Glucose: 375.19 mg/dL

## 2021-06-06 LAB — C-REACTIVE PROTEIN
CRP: 4 mg/dL — ABNORMAL HIGH (ref <1.0)
CRP: 4.3 mg/dL — ABNORMAL HIGH (ref <1.0)

## 2021-06-06 MED ORDER — ACETAMINOPHEN 325 MG PO TABS
650.0000 mg | ORAL_TABLET | Freq: Four times a day (QID) | ORAL | Status: DC | PRN
  Administered 2021-06-06: 650 mg via ORAL
  Filled 2021-06-06: qty 2

## 2021-06-06 MED ORDER — INSULIN ASPART 100 UNIT/ML IJ SOLN
0.0000 [IU] | INTRAMUSCULAR | Status: DC
Start: 2021-06-06 — End: 2021-06-06
  Administered 2021-06-06: 5 [IU] via SUBCUTANEOUS
  Filled 2021-06-06: qty 0.15

## 2021-06-06 MED ORDER — ONDANSETRON HCL 4 MG/2ML IJ SOLN: 4.0000 mg | Freq: Four times a day (QID) | INTRAMUSCULAR | Status: DC | PRN

## 2021-06-06 MED ORDER — INSULIN GLARGINE-YFGN 100 UNIT/ML ~~LOC~~ SOLN
30.0000 [IU] | Freq: Every day | SUBCUTANEOUS | Status: DC
  Administered 2021-06-06 – 2021-06-09 (×4): 30 [IU] via SUBCUTANEOUS
  Filled 2021-06-06 (×4): qty 0.3

## 2021-06-06 MED ORDER — SENNOSIDES-DOCUSATE SODIUM 8.6-50 MG PO TABS: 1.0000 | ORAL_TABLET | Freq: Every evening | ORAL | Status: DC | PRN

## 2021-06-06 MED ORDER — INSULIN ASPART 100 UNIT/ML IJ SOLN
0.0000 [IU] | Freq: Three times a day (TID) | INTRAMUSCULAR | Status: DC
  Administered 2021-06-06 (×3): 2 [IU] via SUBCUTANEOUS
  Administered 2021-06-07: 1 [IU] via SUBCUTANEOUS
  Administered 2021-06-07: 5 [IU] via SUBCUTANEOUS
  Administered 2021-06-07: 9 [IU] via SUBCUTANEOUS
  Administered 2021-06-08: 5 [IU] via SUBCUTANEOUS
  Administered 2021-06-08: 3 [IU] via SUBCUTANEOUS
  Administered 2021-06-08 – 2021-06-09 (×2): 2 [IU] via SUBCUTANEOUS
  Administered 2021-06-09: 5 [IU] via SUBCUTANEOUS
  Administered 2021-06-10: 7 [IU] via SUBCUTANEOUS
  Filled 2021-06-06: qty 0.09

## 2021-06-06 MED ORDER — VANCOMYCIN HCL 750 MG/150ML IV SOLN
750.0000 mg | Freq: Three times a day (TID) | INTRAVENOUS | Status: DC
  Administered 2021-06-06 – 2021-06-10 (×13): 750 mg via INTRAVENOUS
  Filled 2021-06-06 (×15): qty 150

## 2021-06-06 MED ORDER — ONDANSETRON HCL 4 MG PO TABS: 4.0000 mg | ORAL_TABLET | Freq: Four times a day (QID) | ORAL | Status: DC | PRN

## 2021-06-06 MED ORDER — ENOXAPARIN SODIUM 40 MG/0.4ML IJ SOSY
40.0000 mg | PREFILLED_SYRINGE | INTRAMUSCULAR | Status: DC
  Administered 2021-06-06 – 2021-06-09 (×4): 40 mg via SUBCUTANEOUS
  Filled 2021-06-06 (×5): qty 0.4

## 2021-06-06 MED ORDER — ACETAMINOPHEN 650 MG RE SUPP: 650.0000 mg | Freq: Four times a day (QID) | RECTAL | Status: DC | PRN

## 2021-06-06 MED ORDER — SODIUM CHLORIDE 0.9 % IV SOLN: INTRAVENOUS | Status: DC

## 2021-06-06 NOTE — Progress Notes (Signed)
Pharmacy Antibiotic Note  Joseph Barr is a 25 y.o. male admitted on 06/05/2021 with pain,redness and swelling to the left lower leg.  PT states that he got a tattoo in the region ~ 2 weeks ago.  Pharmacy has been consulted to dose vancomycin for cellulitis.  Plan: Vancomycin 1gm IV x 1 then 750mg  q8h (AUC 502.9, Scr 1.1, TBW)  Height: 6' (182.9 cm) Weight: 70.3 kg (155 lb) IBW/kg (Calculated) : 77.6  Temp (24hrs), Avg:99.8 F (37.7 C), Min:99.8 F (37.7 C), Max:99.8 F (37.7 C)  Recent Labs  Lab 06/05/21 2048 06/05/21 2305  WBC 14.4*  --   CREATININE 1.10  --   LATICACIDVEN 2.4* 1.9    Estimated Creatinine Clearance: 101.2 mL/min (by C-G formula based on SCr of 1.1 mg/dL).    No Known Allergies  Antimicrobials this admission: 10/5 doxy po x 1 10/5 cefepime x 1 10/5 vanc >>  Dose adjustments this admission:   Microbiology results: 10/6 BCx:   Thank you for allowing pharmacy to be a part of this patient's care.  12/6 RPh 06/06/2021, 12:54 AM

## 2021-06-06 NOTE — Assessment & Plan Note (Signed)
Sugars improved -Continue Lantus -Continue SS corrections

## 2021-06-06 NOTE — Progress Notes (Signed)
Inpatient Diabetes Program Recommendations  AACE/ADA: New Consensus Statement on Inpatient Glycemic Control (2015)  Target Ranges:  Prepandial:   less than 140 mg/dL      Peak postprandial:   less than 180 mg/dL (1-2 hours)      Critically ill patients:  140 - 180 mg/dL   Lab Results  Component Value Date   GLUCAP 190 (H) 06/06/2021   HGBA1C 14.7 (H) 06/06/2021    Review of Glycemic Control Cellulitis from new tattoo site Diabetes history:DM type 1 Outpatient Diabetes medications: Lantus 30 units, Novolog 12-20 units tid Current orders for Inpatient glycemic control:  Semglee 15 nits Novolog 0-9 units tid  Inpatient Diabetes Program Recommendations:    Spoke with pt at bedside regarding A1c and glucose control at home. Pt works 7 days a week. Wakes up 6am, gets home 6p. Pt reports taking insulin consistently and counting carbs, he also checks glucose 3-5 times a day with a glucometer. I asked him why his A1c was 14.7%, I also asked him again to verify if he truly was compliant with his regimen. Pt reports was in the hospital for an infection 5 months ago and was on antibiotics for 2 months maybe, he saw his PCP at the [facility] 2 weeks after being in the hospital. Pt feels he would like to see an Endocrinologist. WI also discussed with him to see Georgiana Shore Ausdall Pharmacist at the [facility] for DM management. I will write list of Endo's in AVS. I told pt he would need adjustments overtime to his insulin. Pt gets his insulin from the [facility] pharmacy. Pt would benefit from [facility] if the [facility] chooses to utilize. Pt says he can't afford insurance monthly.  Thanks,  Christena Deem RN, MSN, BC-ADM Inpatient Diabetes Coordinator Team Pager [phone] (8a-5p)

## 2021-06-06 NOTE — H&P (Signed)
History and Physical    Joseph Barr:[id number] DOB: 03-30-1996 DOA: 06/05/2021  PCP: [name], NP   Patient coming from: home   Chief Complaint: Lower left leg swelling and redness   HPI: Joseph Barr is a 25 y.o. male with medical history significant for poorly controlled type 1 diabetes mellitus, now presenting to emergency department for evaluation of worsening redness and swelling involving the lower left leg.  The patient reports that he received a tattoo at the lateral aspect of his lower left leg 3 weeks ago.  Patient reports that this was performed at a reputable establishment but he developed some pustules at the site 4 days after.  The pustules appeared to resolve initially but he has since developed worsening redness and swelling to the area.  There was a more localized area of swelling proximal to the left ankle laterally from which the patient reports expressing some purulent material and blood.  He had some general malaise and chills today.  He denies missing any doses of insulin.  Denies any other rashes or skin issues and denies any red/hot/swollen joints.  ED Course: Upon arrival to the ED, patient is found to be afebrile, saturating well on room air, transiently tachycardic to 120s, and with stable blood pressure.  Chemistry panel is notable for glucose of 537 with normal anion gap and bicarbonate.  CBC features a leukocytosis to 14,400.  Initial lactic acid was 2.4.  Patient was given IV fluids in the ED, blood cultures were collected, and he was treated with cefepime, vancomycin, and doxycycline.  Review of Systems:  All other systems reviewed and apart from HPI, are negative.  Past Medical History:  Diagnosis Date   Diabetes mellitus     History reviewed. No pertinent surgical history.  Social History:   reports that he has quit smoking. His smoking use included e-cigarettes. He has quit using smokeless tobacco. He reports current drug use. Drug:  Marijuana. He reports that he does not drink alcohol.  No Known Allergies  Family History  Problem Relation Age of Onset   Cancer Maternal Grandfather    Hypertension Mother    Diabetes Neg Hx      Prior to Admission medications   Medication Sig Start Date End Date Taking? Authorizing Provider  Blood Glucose Monitoring Suppl (TRUE METRIX METER) w/Device KIT Use as directed 12/21/20   Camillia Herter, NP  glucose blood (ACCU-CHEK AVIVA) test strip 1 each by Other route 4 (four) times daily. And lancets 250.03 06/26/14   Renato Shin, MD  glucose blood (TRUE METRIX BLOOD GLUCOSE TEST) test strip Use as instructed 01/08/21   Camillia Herter, NP  insulin aspart (NOVOLOG FLEXPEN) 100 UNIT/ML FlexPen Inject 12-20 Units into the skin 3 (three) times daily with meals. Sliding scale 12/21/20   Camillia Herter, NP  insulin glargine (LANTUS SOLOSTAR) 100 UNIT/ML Solostar Pen Inject 36 Units into the skin daily. 12/21/20 06/11/21  Camillia Herter, NP  Insulin Pen Needle 31G X 5 MM MISC Use as directed in the morning, at noon, and at bedtime. 04/09/20   [name], NP  ondansetron (ZOFRAN) 4 MG tablet Take 1 tablet (4 mg total) by mouth every 8 (eight) hours as needed for nausea or vomiting. 02/28/21   Aline August, MD  pantoprazole (PROTONIX) 40 MG tablet Take 1 tablet (40 mg total) by mouth daily. Patient taking differently: Take 20 mg by mouth daily. 04/09/20 02/27/21  [name], NP  TRUEplus Lancets 28G MISC Use as directed 01/08/21   Camillia Herter, NP    Physical Exam: Vitals:   06/05/21 2009 06/05/21 2238 06/05/21 2302 06/05/21 2312  BP: 107/66   105/72  Pulse: (!) 122   91  Resp: 18   15  Temp: 99.8 F (37.7 C)     TempSrc: Oral     SpO2: 99%   94%  Weight:   70.3 kg   Height:  6' (1.829 m) 6' (1.829 m)     Constitutional: NAD, calm  Eyes: PERTLA, lids and conjunctivae normal ENMT: Mucous membranes are moist. Posterior pharynx clear of any exudate or lesions.   Neck:   supple, no masses  Respiratory: no wheezing, no crackles. No accessory muscle use.  Cardiovascular: S1 & S2 heard, regular rate and rhythm. No significant JVD. Abdomen: No distension, no tenderness, soft. Bowel sounds active.  Musculoskeletal: no clubbing / cyanosis. No joint deformity upper and lower extremities.   Skin: erythema, edema, and warmth involving lower left leg. Warm, dry, well-perfused. Neurologic: CN 2-12 grossly intact. Moving all extremities. Alert, oriented.  Psychiatric: Cooperative.    Labs and Imaging on Admission: I have personally reviewed following labs and imaging studies  CBC: Recent Labs  Lab 06/05/21 2048  WBC 14.4*  NEUTROABS 10.9*  HGB 13.6  HCT 39.0  MCV 84.4  PLT 174   Basic Metabolic Panel: Recent Labs  Lab 06/05/21 2048  NA 125*  K 3.8  CL 90*  CO2 26  GLUCOSE 537*  BUN 13  CREATININE 1.10  CALCIUM 9.2   GFR: Estimated Creatinine Clearance: 101.2 mL/min (by C-G formula based on SCr of 1.1 mg/dL). Liver Function Tests: Recent Labs  Lab 06/05/21 2048  AST 16  ALT 13  ALKPHOS 85  BILITOT 0.6  PROT 7.8  ALBUMIN 3.9   No results for input(s): LIPASE, AMYLASE in the last 168 hours. No results for input(s): AMMONIA in the last 168 hours. Coagulation Profile: No results for input(s): INR, PROTIME in the last 168 hours. Cardiac Enzymes: No results for input(s): CKTOTAL, CKMB, CKMBINDEX, TROPONINI in the last 168 hours. BNP (last 3 results) No results for input(s): PROBNP in the last 8760 hours. HbA1C: No results for input(s): HGBA1C in the last 72 hours. CBG: Recent Labs  Lab 06/05/21 2042  GLUCAP 538*   Lipid Profile: No results for input(s): CHOL, HDL, LDLCALC, TRIG, CHOLHDL, LDLDIRECT in the last 72 hours. Thyroid Function Tests: No results for input(s): TSH, T4TOTAL, FREET4, T3FREE, THYROIDAB in the last 72 hours. Anemia Panel: No results for input(s): VITAMINB12, FOLATE, FERRITIN, TIBC, IRON, RETICCTPCT in the last 72  hours. Urine analysis:    Component Value Date/Time   COLORURINE COLORLESS (A) 02/27/2021 0609   APPEARANCEUR CLEAR 02/27/2021 0609   LABSPEC 1.022 02/27/2021 0609   PHURINE 6.0 02/27/2021 0609   GLUCOSEU >=500 (A) 02/27/2021 0609   HGBUR NEGATIVE 02/27/2021 0609   BILIRUBINUR NEGATIVE 02/27/2021 0609   BILIRUBINUR Negative 08/01/2016 0950   KETONESUR 80 (A) 02/27/2021 0609   PROTEINUR NEGATIVE 02/27/2021 0609   UROBILINOGEN 0.2 08/01/2016 0950   UROBILINOGEN 0.2 04/24/2015 1305   NITRITE NEGATIVE 02/27/2021 0609   LEUKOCYTESUR NEGATIVE 02/27/2021 0609   Sepsis Labs: _0 (procalcitonin:4,lacticidven:4) )No results found for this or any previous visit (from the past 240 hour(s)).   Radiological Exams on Admission: DG Tibia/Fibula Left  Result Date: 06/05/2021 CLINICAL DATA:  Infection. attoo on lateral side of ankle three weeks ago, states it began to have  pus pockets a few days after he got it and leg has been swollen and red and painful for past few days,entire distal tib fib has cellulitis EXAM: LEFT TIBIA AND FIBULA - 2 VIEW; LEFT ANKLE - 2 VIEW COMPARISON:  None. FINDINGS: No cortical erosion or destruction. No evidence of fracture, dislocation, or joint effusion. No evidence of severe arthropathy. Visualized left knee grossly unremarkable. Visualized bones of the left foot grossly unremarkable. No aggressive appearing focal bone abnormality. Subcutaneus soft tissue edema of the anterolateral leg. No retained radiopaque foreign body. IMPRESSION: 1. Negative for acute traumatic injury of the bones. 2. Subcutaneus soft tissue edema of the anterolateral leg. No retained radiopaque foreign body. Electronically Signed   By: Iven Finn M.D.   On: 06/05/2021 23:21   DG Ankle 2 Views Left  Result Date: 06/05/2021 CLINICAL DATA:  Infection. attoo on lateral side of ankle three weeks ago, states it began to have pus pockets a few days after he got it and leg has been swollen and  red and painful for past few days,entire distal tib fib has cellulitis EXAM: LEFT TIBIA AND FIBULA - 2 VIEW; LEFT ANKLE - 2 VIEW COMPARISON:  None. FINDINGS: No cortical erosion or destruction. No evidence of fracture, dislocation, or joint effusion. No evidence of severe arthropathy. Visualized left knee grossly unremarkable. Visualized bones of the left foot grossly unremarkable. No aggressive appearing focal bone abnormality. Subcutaneus soft tissue edema of the anterolateral leg. No retained radiopaque foreign body. IMPRESSION: 1. Negative for acute traumatic injury of the bones. 2. Subcutaneus soft tissue edema of the anterolateral leg. No retained radiopaque foreign body. Electronically Signed   By: Iven Finn M.D.   On: 06/05/2021 23:21     Assessment/Plan  1. Left leg cellulitis  - Presents with increased swelling and redness involving lower left leg, reports purulent drainage at home, and is found to have leukocytosis and transient tachycardia  - qSOFA score is zero, BP stable, and lactate quickly normalized with IVF  - Blood cultures were collected in ED and broad-spectrum antibiotics started  - Continue vancomycin, check [place] for abscess, follow cultures and clinical course    2. Uncontrolled type I DM with hyperglycemia  - Serum glucose 537 in ED without DKA  - A1c was 13.7% in June 2022  - Continue sq insulin, check frequent CBGs, start IV insulin infusion if needed    DVT prophylaxis: Lovenox  Code Status: Full  Level of Care: Level of care: Stepdown Family Communication: None present  Disposition Plan:  Patient is from: home  Anticipated d/c is to: home  Anticipated d/c date is: Possibly as early as 06/07/21 Patient currently: Pending glycemic-control, improvement in cellulitis  Consults called: none  Admission status: Observation     Vianne Bulls, MD [facility]  06/06/2021, 12:42 AM

## 2021-06-06 NOTE — Hospital Course (Addendum)
Joseph Barr is a 25 y.o. M with poorly controlled T1DM who presented with redness and swelling involving the lower left leg for 2 weeks, now worse.    Got a tattoo on LLE ~3 weeks ago.  Developed some pustules at the site 4 days after.  Resolved initially but then slowly progressive redness and swelling to the area since, as well as a more localized area of swelling L ankle, from which he expressed some purulent material and blood.  Today, chills and malaise, so came to ER.   In the ER, tachycardic to 120s, glucose of 537, WBC 14,400.  Initial lactic acid was 2.4.      10/6 Admitted on IV vancomycin for purulent cellulitis

## 2021-06-06 NOTE — Assessment & Plan Note (Signed)
-  Continue vancomycin - If HR, RR, WBC improved tomorrow, likely home with 7 more days Bactrim and cephalexin

## 2021-06-06 NOTE — ED Notes (Signed)
Patient provided Joseph Barr and [employer].

## 2021-06-06 NOTE — Progress Notes (Signed)
  Progress Note    Joseph Barr   JXB:[id number]  DOB: 31-Jul-1996  DOA: 06/05/2021     0 Date of Service: 06/06/2021    Brief summary: Mr. Joseph Barr is a 25 y.o. M with poorly controlled T1DM who presented with redness and swelling involving the lower left leg for 2 weeks, now worse.    Got a tattoo on LLE ~3 weeks ago.  Developed some pustules at the site 4 days after.  Resolved initially but then slowly progressive redness and swelling to the area since, as well as a more localized area of swelling L ankle, from which he expressed some purulent material and blood.  Today, chills and malaise, so came to ER.   In the ER, tachycardic to 120s, glucose of 537, WBC 14,400.  Initial lactic acid was 2.4.      10/6 Admitted on IV vancomycin for purulent cellulitis        Subjective:  Leg is still very red and swollen, no real improvement overnight.  Hospital Problems * Cellulitis Ultrasound the leg shows no DVT, does show a tiny tiny fluid collection, not drainable likely. Left lower extremity x-ray is normal.    -Continue vancomycin - If HR, RR, WBC improved tomorrow, likely home with 7 more days Bactrim and cephalexin    Uncontrolled diabetes mellitus with hyperglycemia (HCC) Sugars improved -Continue Lantus -Continue SS corrections     Objective Vital signs reviewed and remarkable for persistent tachycardia, normal blood pressure. BP 125/78 (BP Location: Right Arm)   Pulse (!) 105   Temp 99.1 F (37.3 C) (Oral)   Resp 16   Ht 6' (1.829 m)   Wt 70.3 kg   SpO2 100%   BMI 21.02 kg/m ' Patient was seen and examined.   Labs / Other Information My review of labs, imaging, notes and other tests is significant for Elevated lactate, now normal.  Glucose normalized.  A1c 14%.  Leg x-ray normal.  Ultrasound of the leg shows a small fluid collection.  White blood cell count still 12K    This is a no charge note, for further details please see the note from Dr. Antionette Char from  earlier today     Time spent: 15 minutes [facility] 06/06/2021, 7:40 PM

## 2021-06-07 LAB — BASIC METABOLIC PANEL
Anion gap: 8 (ref 5–15)
BUN: 7 mg/dL (ref 6–20)
CO2: 28 mmol/L (ref 22–32)
Calcium: 8.9 mg/dL (ref 8.9–10.3)
Chloride: 101 mmol/L (ref 98–111)
Creatinine, Ser: 0.7 mg/dL (ref 0.61–1.24)
GFR, Estimated: >60 mL/min (ref >60)
Glucose, Bld: 123 mg/dL — ABNORMAL HIGH (ref 70–99)
Potassium: 3.4 mmol/L — ABNORMAL LOW (ref 3.5–5.1)
Sodium: 137 mmol/L (ref 135–145)

## 2021-06-07 LAB — GLUCOSE, CAPILLARY
Glucose-Capillary: 144 mg/dL — ABNORMAL HIGH (ref 70–99)
Glucose-Capillary: 266 mg/dL — ABNORMAL HIGH (ref 70–99)
Glucose-Capillary: 374 mg/dL — ABNORMAL HIGH (ref 70–99)
Glucose-Capillary: 279 mg/dL — ABNORMAL HIGH (ref 70–99)

## 2021-06-07 LAB — CBC
HCT: 33.4 % — ABNORMAL LOW (ref 39.0–52.0)
Hemoglobin: 11.7 g/dL — ABNORMAL LOW (ref 13.0–17.0)
MCH: 29.7 pg (ref 26.0–34.0)
MCHC: 35 g/dL (ref 30.0–36.0)
MCV: 84.8 fL (ref 80.0–100.0)
Platelets: 321 10*3/uL (ref 150–400)
RBC: 3.94 MIL/uL — ABNORMAL LOW (ref 4.22–5.81)
RDW: 11.6 % (ref 11.5–15.5)
WBC: 11.2 10*3/uL — ABNORMAL HIGH (ref 4.0–10.5)
nRBC: 0 % (ref 0.0–0.2)

## 2021-06-07 MED ORDER — POTASSIUM CHLORIDE CRYS ER 20 MEQ PO TBCR
40.0000 meq | EXTENDED_RELEASE_TABLET | Freq: Once | ORAL | Status: AC
  Administered 2021-06-07: 40 meq via ORAL
  Filled 2021-06-07: qty 2

## 2021-06-07 NOTE — Progress Notes (Signed)
Patient ID: Joseph Barr, male   DOB: August 25, 1996, 25 y.o.   MRN: [id number]  PROGRESS NOTE    Joseph Barr  [id number]:[id number] DOB: August 09, 1996 DOA: 06/05/2021 PCP: Claiborne Rigg, NP   Brief Narrative:  25 year old male with history of poorly controlled type 1 diabetes mellitus presented with worsening redness and swelling involving the left lower leg after a tattoo 3 weeks ago.  On presentation, he was tachycardic to 120s with hyperglycemia and leukocytosis and initial lactic acid of 2.4.  He was admitted and started on IV and vancomycin for purulent cellulitis.  Assessment & Plan:   Left lower extremity cellulitis -Lower extremity ultrasound showed no DVT but showed a tiny fluid collection, not drainable likely.  Left lower extremity x-ray was normal. -Currently on IV vancomycin; erythema and swelling improving but still significant.  We will continue IV vancomycin for now.  Diabetes mellitus type 1 with hyperglycemia -Blood sugars improving.  Lantus along with CBGs with SSI.  Diabetes coordinator following. -A1c 14  Leukocytosis -Improving  Hypokalemia -Replace  Hyponatremia -Resolved     DVT prophylaxis: Lovenox Code Status: Full Family Communication: None at bedside Disposition Plan: Status is: Inpatient  Remains inpatient appropriate because:Inpatient level of care appropriate due to severity of illness  Dispo: The patient is from: Home              Anticipated d/c is to: Home              Patient currently is not medically stable to d/c.   Difficult to place patient No  Consultants: None  Procedures: None  Antimicrobials:  Anti-infectives (From admission, onward)    Start     Dose/Rate Route Frequency Ordered Stop   06/06/21 0800  vancomycin (VANCOREADY) IVPB 750 mg/150 mL        750 mg 150 mL/hr over 60 Minutes Intravenous Every 8 hours 06/06/21 0056     06/05/21 2230  vancomycin (VANCOCIN) IVPB 1000 mg/200 mL premix        1,000 mg 200 mL/hr over 60  Minutes Intravenous  Once 06/05/21 2221 06/06/21 0032   06/05/21 2230  ceFEPIme (MAXIPIME) 2 g in sodium chloride 0.9 % 100 mL IVPB        2 g 200 mL/hr over 30 Minutes Intravenous  Once 06/05/21 2221 06/06/21 0001   06/05/21 2045  doxycycline (VIBRA-TABS) tablet 100 mg        100 mg Oral  Once 06/05/21 2030 06/05/21 2040   06/05/21 0000  doxycycline (VIBRAMYCIN) 100 MG capsule  Status:  Discontinued        100 mg Oral 2 times daily 06/05/21 2030 06/05/21          Subjective: Patient seen and examined at bedside.  Feels that his redness and swelling are improving but still has some pain and does not feel ready to go home yet.  No overnight fever or vomiting reported.  Objective: Vitals:   06/06/21 1335 06/06/21 1450 06/06/21 2126 06/07/21 0921  BP: 134/85 125/78 114/68 121/77  Pulse: (!) 108 (!) 105 (!) 108 (!) 101  Resp: 16 16 17 20   Temp:  99.1 F (37.3 C) 99.1 F (37.3 C) 98.1 F (36.7 C)  TempSrc:  Oral Oral Oral  SpO2: 100% 100% 98% 99%  Weight:      Height:        Intake/Output Summary (Last 24 hours) at 06/07/2021 1154 Last data filed at 06/07/2021 0800 Gross per 24 hour  Intake  682.23 ml  Output 800 ml  Net -117.77 ml   Filed Weights   06/05/21 2302  Weight: 70.3 kg    Examination:  General exam: Appears calm and comfortable.  Currently on room air. Respiratory system: Bilateral decreased breath sounds at bases Cardiovascular system: S1 & S2 heard, tachycardic intermittently Gastrointestinal system: Abdomen is nondistended, soft and nontender. Normal bowel sounds heard. Extremities: No cyanosis, clubbing; left lower extremity swollen above the ankle with erythema and tenderness present.      Data Reviewed: I have personally reviewed following labs and imaging studies  CBC: Recent Labs  Lab 06/05/21 2048 06/06/21 0508 06/07/21 0419  WBC 14.4* 12.4* 11.2*  NEUTROABS 10.9*  --   --   HGB 13.6 12.4* 11.7*  HCT 39.0 35.3* 33.4*  MCV 84.4 84.4 84.8   PLT 367 316 321   Basic Metabolic Panel: Recent Labs  Lab 06/05/21 2048 06/06/21 0508 06/07/21 0419  NA 125* 139 137  K 3.8 3.8 3.4*  CL 90* 103 101  CO2 26 30 28   GLUCOSE 537* 153* 123*  BUN 13 10 7   CREATININE 1.10 0.76 0.70  CALCIUM 9.2 9.2 8.9   GFR: Estimated Creatinine Clearance: 139.1 mL/min (by C-G formula based on SCr of 0.7 mg/dL). Liver Function Tests: Recent Labs  Lab 06/05/21 2048  AST 16  ALT 13  ALKPHOS 85  BILITOT 0.6  PROT 7.8  ALBUMIN 3.9   No results for input(s): LIPASE, AMYLASE in the last 168 hours. No results for input(s): AMMONIA in the last 168 hours. Coagulation Profile: No results for input(s): INR, PROTIME in the last 168 hours. Cardiac Enzymes: No results for input(s): CKTOTAL, CKMB, CKMBINDEX, TROPONINI in the last 168 hours. BNP (last 3 results) No results for input(s): PROBNP in the last 8760 hours. HbA1C: Recent Labs    06/06/21 0508  HGBA1C 14.7*   CBG: Recent Labs  Lab 06/06/21 0758 06/06/21 1300 06/06/21 1723 06/06/21 2124 06/07/21 0754  GLUCAP 196* 190* 198* 130* 144*   Lipid Profile: No results for input(s): CHOL, HDL, LDLCALC, TRIG, CHOLHDL, LDLDIRECT in the last 72 hours. Thyroid Function Tests: No results for input(s): TSH, T4TOTAL, FREET4, T3FREE, THYROIDAB in the last 72 hours. Anemia Panel: No results for input(s): VITAMINB12, FOLATE, FERRITIN, TIBC, IRON, RETICCTPCT in the last 72 hours. Sepsis Labs: Recent Labs  Lab 06/05/21 2048 06/05/21 2305  LATICACIDVEN 2.4* 1.9    Recent Results (from the past 240 hour(s))  Resp Panel by RT-PCR (Flu A&B, Covid) Peripheral     Status: None   Collection Time: 06/06/21 12:09 AM   Specimen: Peripheral; Nasopharyngeal(NP) swabs in vial transport medium  Result Value Ref Range Status   SARS Coronavirus 2 by RT PCR NEGATIVE NEGATIVE Final    Comment: (NOTE) SARS-CoV-2 target nucleic acids are NOT DETECTED.  The SARS-CoV-2 RNA is generally detectable in upper  respiratory specimens during the acute phase of infection. The lowest concentration of SARS-CoV-2 viral copies this assay can detect is 138 copies/mL. A negative result does not preclude SARS-Cov-2 infection and should not be used as the sole basis for treatment or other patient management decisions. A negative result may occur with  improper specimen collection/handling, submission of specimen other than nasopharyngeal swab, presence of viral mutation(s) within the areas targeted by this assay, and inadequate number of viral copies(<138 copies/mL). A negative result must be combined with clinical observations, patient history, and epidemiological information. The expected result is Negative.  Fact Sheet for Patients:  2306  Fact Sheet for Healthcare Providers:  SeriousBroker.it  This test is no t yet approved or cleared by the [place] FDA and  has been authorized for detection and/or diagnosis of SARS-CoV-2 by FDA under an Emergency Use Authorization (EUA). This EUA will remain  in effect (meaning this test can be used) for the duration of the COVID-19 declaration under Section 564(b)(1) of the Act, 21 U.S.C.section 360bbb-3(b)(1), unless the authorization is terminated  or revoked sooner.       Influenza A by PCR NEGATIVE NEGATIVE Final   Influenza B by PCR NEGATIVE NEGATIVE Final    Comment: (NOTE) The Xpert Xpress SARS-CoV-2/FLU/RSV plus assay is intended as an aid in the diagnosis of influenza from Nasopharyngeal swab specimens and should not be used as a sole basis for treatment. Nasal washings and aspirates are unacceptable for Xpert Xpress SARS-CoV-2/FLU/RSV testing.  Fact Sheet for Patients: BloggerCourse.com  Fact Sheet for Healthcare Providers: SeriousBroker.it  This test is not yet approved or cleared by the [place] FDA and has been  authorized for detection and/or diagnosis of SARS-CoV-2 by FDA under an Emergency Use Authorization (EUA). This EUA will remain in effect (meaning this test can be used) for the duration of the COVID-19 declaration under Section 564(b)(1) of the Act, 21 U.S.C. section 360bbb-3(b)(1), unless the authorization is terminated or revoked.  Performed at [facility], [address], [place], [place] [address]   Blood culture (routine x 2)     Status: None (Preliminary result)   Collection Time: 06/06/21 12:30 AM   Specimen: BLOOD  Result Value Ref Range Status   Specimen Description   Final    BLOOD BLOOD LEFT WRIST Performed at [facility], [address], [place], [place] [address]    Special Requests   Final    BOTTLES DRAWN AEROBIC ONLY Blood Culture results may not be optimal due to an inadequate volume of blood received in culture bottles Performed at [facility], [address], [place], [place] [address]    Culture   Final    NO GROWTH < 12 HOURS Performed at [facility], [address] [place], [place] [address]    Report Status PENDING  Incomplete  Blood culture (routine x 2)     Status: None (Preliminary result)   Collection Time: 06/06/21 12:30 AM   Specimen: BLOOD  Result Value Ref Range Status   Specimen Description   Final    BLOOD BLOOD LEFT FOREARM Performed at [facility], [address] [place], [place] [address]    Special Requests   Final    BOTTLES DRAWN AEROBIC AND ANAEROBIC Blood Culture adequate volume Performed at [facility], [address], [place], [place] [address]    Culture   Final    NO GROWTH < 12 HOURS Performed at [facility], [address] [place], [place] [address]    Report Status PENDING  Incomplete         Radiology Studies: DG Tibia/Fibula Left  Result Date: 06/05/2021 CLINICAL DATA:  Infection. attoo on lateral  side of ankle three weeks ago, states it began to have pus pockets a few days after he got it and leg has been swollen and red and painful for past few days,entire distal tib fib has cellulitis EXAM: LEFT TIBIA AND FIBULA - 2 VIEW; LEFT ANKLE - 2 VIEW COMPARISON:  None. FINDINGS: No cortical erosion or destruction. No evidence of  fracture, dislocation, or joint effusion. No evidence of severe arthropathy. Visualized left knee grossly unremarkable. Visualized bones of the left foot grossly unremarkable. No aggressive appearing focal bone abnormality. Subcutaneus soft tissue edema of the anterolateral leg. No retained radiopaque foreign body. IMPRESSION: 1. Negative for acute traumatic injury of the bones. 2. Subcutaneus soft tissue edema of the anterolateral leg. No retained radiopaque foreign body. Electronically Signed   By: Tish Frederickson M.D.   On: 06/05/2021 23:21   DG Ankle 2 Views Left  Result Date: 06/05/2021 CLINICAL DATA:  Infection. attoo on lateral side of ankle three weeks ago, states it began to have pus pockets a few days after he got it and leg has been swollen and red and painful for past few days,entire distal tib fib has cellulitis EXAM: LEFT TIBIA AND FIBULA - 2 VIEW; LEFT ANKLE - 2 VIEW COMPARISON:  None. FINDINGS: No cortical erosion or destruction. No evidence of fracture, dislocation, or joint effusion. No evidence of severe arthropathy. Visualized left knee grossly unremarkable. Visualized bones of the left foot grossly unremarkable. No aggressive appearing focal bone abnormality. Subcutaneus soft tissue edema of the anterolateral leg. No retained radiopaque foreign body. IMPRESSION: 1. Negative for acute traumatic injury of the bones. 2. Subcutaneus soft tissue edema of the anterolateral leg. No retained radiopaque foreign body. Electronically Signed   By: Tish Frederickson M.D.   On: 06/05/2021 23:21   [place] LT LOWER EXTREM LTD SOFT TISSUE NON VASCULAR  Result Date: 06/06/2021 CLINICAL  DATA:  Left leg pain for 2 days, findings suggestive of cellulitis EXAM: ULTRASOUND LEFT LOWER EXTREMITY LIMITED TECHNIQUE: Ultrasound examination of the lower extremity soft tissues was performed in the area of clinical concern. COMPARISON:  None. FINDINGS: Scanning in the area of clinical concern shows significant subcutaneous edema as well as a more focal fluid collection measuring 5 x 5 x 7 mm in dimension. This may represent a small subcutaneous abscess. IMPRESSION: Subcutaneous edema consistent with the given clinical history of cellulitis. A focal 5 x 7 mm area of fluid is noted which may represent a tiny subcutaneous abscess. Electronically Signed   By: Alcide Clever M.D.   On: 06/06/2021 01:45        Scheduled Meds:  enoxaparin (LOVENOX) injection  40 mg Subcutaneous Q24H   insulin aspart  0-9 Units Subcutaneous TID WC   insulin glargine-yfgn  30 Units Subcutaneous Daily   Continuous Infusions:  vancomycin 750 mg (06/07/21 0945)          Glade Lloyd, MD [facility] 06/07/2021, 11:54 AM

## 2021-06-08 LAB — GLUCOSE, CAPILLARY
Glucose-Capillary: 269 mg/dL — ABNORMAL HIGH (ref 70–99)
Glucose-Capillary: 221 mg/dL — ABNORMAL HIGH (ref 70–99)
Glucose-Capillary: 185 mg/dL — ABNORMAL HIGH (ref 70–99)
Glucose-Capillary: 239 mg/dL — ABNORMAL HIGH (ref 70–99)

## 2021-06-08 LAB — CBC
HCT: 34.3 % — ABNORMAL LOW (ref 39.0–52.0)
Hemoglobin: 12 g/dL — ABNORMAL LOW (ref 13.0–17.0)
MCH: 29.6 pg (ref 26.0–34.0)
MCHC: 35 g/dL (ref 30.0–36.0)
MCV: 84.5 fL (ref 80.0–100.0)
Platelets: 349 10*3/uL (ref 150–400)
RBC: 4.06 MIL/uL — ABNORMAL LOW (ref 4.22–5.81)
RDW: 11.4 % — ABNORMAL LOW (ref 11.5–15.5)
WBC: 8.7 10*3/uL (ref 4.0–10.5)
nRBC: 0 % (ref 0.0–0.2)

## 2021-06-08 LAB — BASIC METABOLIC PANEL
Anion gap: 7 (ref 5–15)
BUN: 11 mg/dL (ref 6–20)
CO2: 28 mmol/L (ref 22–32)
Calcium: 9.1 mg/dL (ref 8.9–10.3)
Chloride: 97 mmol/L — ABNORMAL LOW (ref 98–111)
Creatinine, Ser: 0.69 mg/dL (ref 0.61–1.24)
GFR, Estimated: >60 mL/min (ref >60)
Glucose, Bld: 275 mg/dL — ABNORMAL HIGH (ref 70–99)
Potassium: 4.2 mmol/L (ref 3.5–5.1)
Sodium: 132 mmol/L — ABNORMAL LOW (ref 135–145)

## 2021-06-08 LAB — MAGNESIUM: Magnesium: 1.9 mg/dL (ref 1.7–2.4)

## 2021-06-08 LAB — C-REACTIVE PROTEIN: CRP: 5 mg/dL — ABNORMAL HIGH (ref <1.0)

## 2021-06-08 MED ORDER — INSULIN ASPART 100 UNIT/ML IJ SOLN
7.0000 [IU] | Freq: Three times a day (TID) | INTRAMUSCULAR | Status: DC
  Administered 2021-06-08 – 2021-06-09 (×3): 7 [IU] via SUBCUTANEOUS

## 2021-06-08 NOTE — Plan of Care (Signed)
  Problem: Clinical Measurements: Goal: Diagnostic test results will improve Outcome: Progressing   Problem: Clinical Measurements: Goal: Respiratory complications will improve Outcome: Progressing   Problem: Clinical Measurements: Goal: Cardiovascular complication will be avoided Outcome: Progressing   Problem: Elimination: Goal: Will not experience complications related to bowel motility Outcome: Progressing   Problem: Elimination: Goal: Will not experience complications related to urinary retention Outcome: Progressing   Problem: Skin Integrity: Goal: Risk for impaired skin integrity will decrease Outcome: Progressing   

## 2021-06-08 NOTE — Progress Notes (Signed)
Patient ID: Joseph Barr, male   DOB: 1995/11/22, 25 y.o.   MRN: [id number]  PROGRESS NOTE    Joseph Barr  WUJ:[id number] DOB: 01/29/1996 DOA: 06/05/2021 PCP: Claiborne Rigg, NP   Brief Narrative:  25 year old male with history of poorly controlled type 1 diabetes mellitus presented with worsening redness and swelling involving the left lower leg after a tattoo 3 weeks ago.  On presentation, he was tachycardic to 120s with hyperglycemia and leukocytosis and initial lactic acid of 2.4.  He was admitted and started on IV and vancomycin for purulent cellulitis.  Assessment & Plan:   Left lower extremity cellulitis -Lower extremity ultrasound showed no DVT but showed a tiny fluid collection, not drainable likely.  Left lower extremity x-ray was normal. -Currently on IV vancomycin; erythema and swelling improving but still significant.  Continue IV vancomycin for now.  Diabetes mellitus type 1 with hyperglycemia -Blood sugars still intermittently high.  Continue Lantus along with CBGs with SSI.  Add NovoLog with meals.  Diabetes coordinator following. -A1c 14  Leukocytosis -Improving.  Resolved.  Hypokalemia -Improved  Hyponatremia -Resolved     DVT prophylaxis: Lovenox Code Status: Full Family Communication: None at bedside Disposition Plan: Status is: Inpatient  Remains inpatient appropriate because:Inpatient level of care appropriate due to severity of illness  Dispo: The patient is from: Home              Anticipated d/c is to: Home in 1 to 2 days once clinically improved.              Patient currently is not medically stable to d/c.   Difficult to place patient No  Consultants: None  Procedures: None  Antimicrobials:  Anti-infectives (From admission, onward)    Start     Dose/Rate Route Frequency Ordered Stop   06/06/21 0800  vancomycin (VANCOREADY) IVPB 750 mg/150 mL        750 mg 150 mL/hr over 60 Minutes Intravenous Every 8 hours 06/06/21 0056      06/05/21 2230  vancomycin (VANCOCIN) IVPB 1000 mg/200 mL premix        1,000 mg 200 mL/hr over 60 Minutes Intravenous  Once 06/05/21 2221 06/06/21 0032   06/05/21 2230  ceFEPIme (MAXIPIME) 2 g in sodium chloride 0.9 % 100 mL IVPB        2 g 200 mL/hr over 30 Minutes Intravenous  Once 06/05/21 2221 06/06/21 0001   06/05/21 2045  doxycycline (VIBRA-TABS) tablet 100 mg        100 mg Oral  Once 06/05/21 2030 06/05/21 2040   06/05/21 0000  doxycycline (VIBRAMYCIN) 100 MG capsule  Status:  Discontinued        100 mg Oral 2 times daily 06/05/21 2030 06/05/21          Subjective: Patient seen and examined at bedside.  Leg swelling and pain are improving but still concerned about the redness.  No overnight fever or vomiting.  Does not feel ready to go home yet.  Objective: Vitals:   06/07/21 0921 06/07/21 1610 06/07/21 2039 06/08/21 0641  BP: 121/77 130/76 127/78 121/79  Pulse: (!) 101 (!) 102 (!) 106 85  Resp: 20 20 16 16   Temp: 98.1 F (36.7 C) 98.2 F (36.8 C) 99.8 F (37.7 C) 98.2 F (36.8 C)  TempSrc: Oral Oral Oral Oral  SpO2: 99% 99% 99% 98%  Weight:      Height:        Intake/Output Summary (Last 24  hours) at 06/08/2021 1041 Last data filed at 06/08/2021 0900 Gross per 24 hour  Intake 802.35 ml  Output --  Net 802.35 ml    Filed Weights   06/05/21 2302  Weight: 70.3 kg    Examination:  General exam: No distress.  On room air currently. Respiratory system: Decreased breath sounds at bases bilaterally Cardiovascular system: Intermittent tachycardia; S1-S2 heard Gastrointestinal system: Abdomen is distended slightly, soft and nontender.  Bowel sounds are heard  extremities: No clubbing; left lower extremity still swollen above the ankle with mild erythema and tenderness present, improving compared to yesterday.      Data Reviewed: I have personally reviewed following labs and imaging studies  CBC: Recent Labs  Lab 06/05/21 2048 06/06/21 0508 06/07/21 0419  06/08/21 0439  WBC 14.4* 12.4* 11.2* 8.7  NEUTROABS 10.9*  --   --   --   HGB 13.6 12.4* 11.7* 12.0*  HCT 39.0 35.3* 33.4* 34.3*  MCV 84.4 84.4 84.8 84.5  PLT 367 316 321 349    Basic Metabolic Panel: Recent Labs  Lab 06/05/21 2048 06/06/21 0508 06/07/21 0419 06/08/21 0439  NA 125* 139 137 132*  K 3.8 3.8 3.4* 4.2  CL 90* 103 101 97*  CO2 26 30 28 28   GLUCOSE 537* 153* 123* 275*  BUN 13 10 7 11   CREATININE 1.10 0.76 0.70 0.69  CALCIUM 9.2 9.2 8.9 9.1  MG  --   --   --  1.9    GFR: Estimated Creatinine Clearance: 139.1 mL/min (by C-G formula based on SCr of 0.69 mg/dL). Liver Function Tests: Recent Labs  Lab 06/05/21 2048  AST 16  ALT 13  ALKPHOS 85  BILITOT 0.6  PROT 7.8  ALBUMIN 3.9    No results for input(s): LIPASE, AMYLASE in the last 168 hours. No results for input(s): AMMONIA in the last 168 hours. Coagulation Profile: No results for input(s): INR, PROTIME in the last 168 hours. Cardiac Enzymes: No results for input(s): CKTOTAL, CKMB, CKMBINDEX, TROPONINI in the last 168 hours. BNP (last 3 results) No results for input(s): PROBNP in the last 8760 hours. HbA1C: Recent Labs    06/06/21 0508  HGBA1C 14.7*    CBG: Recent Labs  Lab 06/07/21 0754 06/07/21 1158 06/07/21 1646 06/07/21 2041 06/08/21 0710  GLUCAP 144* 266* 374* 279* 269*    Lipid Profile: No results for input(s): CHOL, HDL, LDLCALC, TRIG, CHOLHDL, LDLDIRECT in the last 72 hours. Thyroid Function Tests: No results for input(s): TSH, T4TOTAL, FREET4, T3FREE, THYROIDAB in the last 72 hours. Anemia Panel: No results for input(s): VITAMINB12, FOLATE, FERRITIN, TIBC, IRON, RETICCTPCT in the last 72 hours. Sepsis Labs: Recent Labs  Lab 06/05/21 2048 06/05/21 2305  LATICACIDVEN 2.4* 1.9     Recent Results (from the past 240 hour(s))  Resp Panel by RT-PCR (Flu A&B, Covid) Peripheral     Status: None   Collection Time: 06/06/21 12:09 AM   Specimen: Peripheral; Nasopharyngeal(NP)  swabs in vial transport medium  Result Value Ref Range Status   SARS Coronavirus 2 by RT PCR NEGATIVE NEGATIVE Final    Comment: (NOTE) SARS-CoV-2 target nucleic acids are NOT DETECTED.  The SARS-CoV-2 RNA is generally detectable in upper respiratory specimens during the acute phase of infection. The lowest concentration of SARS-CoV-2 viral copies this assay can detect is 138 copies/mL. A negative result does not preclude SARS-Cov-2 infection and should not be used as the sole basis for treatment or other patient management decisions. A negative result may  occur with  improper specimen collection/handling, submission of specimen other than nasopharyngeal swab, presence of viral mutation(s) within the areas targeted by this assay, and inadequate number of viral copies(<138 copies/mL). A negative result must be combined with clinical observations, patient history, and epidemiological information. The expected result is Negative.  Fact Sheet for Patients:  BloggerCourse.com  Fact Sheet for Healthcare Providers:  SeriousBroker.it  This test is no t yet approved or cleared by the [place] FDA and  has been authorized for detection and/or diagnosis of SARS-CoV-2 by FDA under an Emergency Use Authorization (EUA). This EUA will remain  in effect (meaning this test can be used) for the duration of the COVID-19 declaration under Section 564(b)(1) of the Act, 21 U.S.C.section 360bbb-3(b)(1), unless the authorization is terminated  or revoked sooner.       Influenza A by PCR NEGATIVE NEGATIVE Final   Influenza B by PCR NEGATIVE NEGATIVE Final    Comment: (NOTE) The Xpert Xpress SARS-CoV-2/FLU/RSV plus assay is intended as an aid in the diagnosis of influenza from Nasopharyngeal swab specimens and should not be used as a sole basis for treatment. Nasal washings and aspirates are unacceptable for Xpert Xpress  SARS-CoV-2/FLU/RSV testing.  Fact Sheet for Patients: BloggerCourse.com  Fact Sheet for Healthcare Providers: SeriousBroker.it  This test is not yet approved or cleared by the [place] FDA and has been authorized for detection and/or diagnosis of SARS-CoV-2 by FDA under an Emergency Use Authorization (EUA). This EUA will remain in effect (meaning this test can be used) for the duration of the COVID-19 declaration under Section 564(b)(1) of the Act, 21 U.S.C. section 360bbb-3(b)(1), unless the authorization is terminated or revoked.  Performed at [facility], [address] [place], [place] [address]   Blood culture (routine x 2)     Status: None (Preliminary result)   Collection Time: 06/06/21 12:30 AM   Specimen: BLOOD  Result Value Ref Range Status   Specimen Description   Final    BLOOD BLOOD LEFT WRIST Performed at [facility], [address], [place], [place] [address]    Special Requests   Final    BOTTLES DRAWN AEROBIC ONLY Blood Culture results may not be optimal due to an inadequate volume of blood received in culture bottles Performed at [facility], [address], [place], [place] [address]    Culture   Final    NO GROWTH 1 DAY Performed at [facility], [address], [place], [place] [address]    Report Status PENDING  Incomplete  Blood culture (routine x 2)     Status: None (Preliminary result)   Collection Time: 06/06/21 12:30 AM   Specimen: BLOOD  Result Value Ref Range Status   Specimen Description   Final    BLOOD BLOOD LEFT FOREARM Performed at [facility], [address], [place], [place] [address]    Special Requests   Final    BOTTLES DRAWN AEROBIC AND ANAEROBIC Blood Culture adequate volume Performed at [facility], [address] [place], [place] [address]    Culture   Final    NO  GROWTH 1 DAY Performed at [facility], [address], [place], [place] [address]    Report Status PENDING  Incomplete          Radiology Studies: No results found.      Scheduled Meds:  enoxaparin (LOVENOX) injection  40 mg Subcutaneous Q24H  insulin aspart  0-9 Units Subcutaneous TID WC   insulin aspart  7 Units Subcutaneous TID WC   insulin glargine-yfgn  30 Units Subcutaneous Daily   Continuous Infusions:  vancomycin Stopped (06/08/21 3335)          Glade Lloyd, MD [facility] 06/08/2021, 10:41 AM

## 2021-06-09 LAB — GLUCOSE, CAPILLARY
Glucose-Capillary: 183 mg/dL — ABNORMAL HIGH (ref 70–99)
Glucose-Capillary: 283 mg/dL — ABNORMAL HIGH (ref 70–99)
Glucose-Capillary: 295 mg/dL — ABNORMAL HIGH (ref 70–99)
Glucose-Capillary: 429 mg/dL — ABNORMAL HIGH (ref 70–99)
Glucose-Capillary: 62 mg/dL — ABNORMAL LOW (ref 70–99)
Glucose-Capillary: 62 mg/dL — ABNORMAL LOW (ref 70–99)
Glucose-Capillary: 85 mg/dL (ref 70–99)

## 2021-06-09 MED ORDER — INSULIN ASPART 100 UNIT/ML IJ SOLN
10.0000 [IU] | Freq: Three times a day (TID) | INTRAMUSCULAR | Status: DC
  Administered 2021-06-09: 10 [IU] via SUBCUTANEOUS

## 2021-06-09 MED ORDER — INSULIN ASPART 100 UNIT/ML IJ SOLN
2.0000 [IU] | Freq: Once | INTRAMUSCULAR | Status: AC
  Administered 2021-06-09: 2 [IU] via SUBCUTANEOUS

## 2021-06-09 MED ORDER — INSULIN GLARGINE-YFGN 100 UNIT/ML ~~LOC~~ SOLN
35.0000 [IU] | Freq: Every day | SUBCUTANEOUS | Status: DC
  Administered 2021-06-10: 35 [IU] via SUBCUTANEOUS
  Filled 2021-06-09: qty 0.35

## 2021-06-09 NOTE — Progress Notes (Signed)
Patient ID: Joseph Barr, male   DOB: 11-09-1995, 25 y.o.   MRN: [id number]  PROGRESS NOTE    Joseph Barr  XBM:[id number] DOB: 01-10-1996 DOA: 06/05/2021 PCP: Claiborne Rigg, NP   Brief Narrative:  25 year old male with history of poorly controlled type 1 diabetes mellitus presented with worsening redness and swelling involving the left lower leg after a tattoo 3 weeks ago.  On presentation, he was tachycardic to 120s with hyperglycemia and leukocytosis and initial lactic acid of 2.4.  He was admitted and started on IV and vancomycin for purulent cellulitis.  Assessment & Plan:   Left lower extremity cellulitis -Lower extremity ultrasound showed no DVT but showed a tiny fluid collection, not drainable likely.  Left lower extremity x-ray was normal. -Currently on IV vancomycin; erythema very slow to respond.  Continue IV vancomycin for now.  Diabetes mellitus type 1 with hyperglycemia -Blood sugars still intermittently high.  Continue Lantus along with CBGs with SSI.  Continue NovoLog with meals.  Diabetes coordinator following. -A1c 14  Leukocytosis -Improving.  Resolved.  Hypokalemia -Improved  Hyponatremia -Resolved     DVT prophylaxis: Lovenox Code Status: Full Family Communication: None at bedside Disposition Plan: Status is: Inpatient  Remains inpatient appropriate because:Inpatient level of care appropriate due to severity of illness  Dispo: The patient is from: Home              Anticipated d/c is to: Home in 1 to 2 days once clinically improved.              Patient currently is not medically stable to d/c.   Difficult to place patient No  Consultants: None  Procedures: None  Antimicrobials:  Anti-infectives (From admission, onward)    Start     Dose/Rate Route Frequency Ordered Stop   06/06/21 0800  vancomycin (VANCOREADY) IVPB 750 mg/150 mL        750 mg 150 mL/hr over 60 Minutes Intravenous Every 8 hours 06/06/21 0056     06/05/21 2230  vancomycin  (VANCOCIN) IVPB 1000 mg/200 mL premix        1,000 mg 200 mL/hr over 60 Minutes Intravenous  Once 06/05/21 2221 06/06/21 0032   06/05/21 2230  ceFEPIme (MAXIPIME) 2 g in sodium chloride 0.9 % 100 mL IVPB        2 g 200 mL/hr over 30 Minutes Intravenous  Once 06/05/21 2221 06/06/21 0001   06/05/21 2045  doxycycline (VIBRA-TABS) tablet 100 mg        100 mg Oral  Once 06/05/21 2030 06/05/21 2040   06/05/21 0000  doxycycline (VIBRAMYCIN) 100 MG capsule  Status:  Discontinued        100 mg Oral 2 times daily 06/05/21 2030 06/05/21          Subjective: Patient seen and examined at bedside.  Still complains of intermittent left lower extremity pain.  Still worried about the swelling.  No overnight fever or vomiting reported. Objective: Vitals:   06/08/21 0641 06/08/21 1343 06/08/21 2042 06/09/21 0503  BP: 121/79 121/82 113/75 104/69  Pulse: 85 98 96 88  Resp: 16 16 20 16   Temp: 98.2 F (36.8 C) 98.3 F (36.8 C) 98.2 F (36.8 C) 98.3 F (36.8 C)  TempSrc: Oral Oral Oral Oral  SpO2: 98% 100% 100% 97%  Weight:      Height:        Intake/Output Summary (Last 24 hours) at 06/09/2021 1029 Last data filed at 06/09/2021 0910 Gross per  24 hour  Intake 2189.07 ml  Output 1 ml  Net 2188.07 ml    Filed Weights   06/05/21 2302  Weight: 70.3 kg    Examination:  General exam: No acute distress.  Still on room air.   Respiratory system: Bilateral decreased breath sounds at bases Cardiovascular system: S1-S2 heard; rate controlled gastrointestinal system: Abdomen is mildly distended, soft and nontender.  Bowel sounds are heard  extremities: No clubbing; left lower extremity still mildly swollen above the ankle with mild erythema and tenderness present, still improving compared to yesterday.   Data Reviewed: I have personally reviewed following labs and imaging studies  CBC: Recent Labs  Lab 06/05/21 2048 06/06/21 0508 06/07/21 0419 06/08/21 0439  WBC 14.4* 12.4* 11.2* 8.7   NEUTROABS 10.9*  --   --   --   HGB 13.6 12.4* 11.7* 12.0*  HCT 39.0 35.3* 33.4* 34.3*  MCV 84.4 84.4 84.8 84.5  PLT 367 316 321 349    Basic Metabolic Panel: Recent Labs  Lab 06/05/21 2048 06/06/21 0508 06/07/21 0419 06/08/21 0439  NA 125* 139 137 132*  K 3.8 3.8 3.4* 4.2  CL 90* 103 101 97*  CO2 26 30 28 28   GLUCOSE 537* 153* 123* 275*  BUN 13 10 7 11   CREATININE 1.10 0.76 0.70 0.69  CALCIUM 9.2 9.2 8.9 9.1  MG  --   --   --  1.9    GFR: Estimated Creatinine Clearance: 139.1 mL/min (by C-G formula based on SCr of 0.69 mg/dL). Liver Function Tests: Recent Labs  Lab 06/05/21 2048  AST 16  ALT 13  ALKPHOS 85  BILITOT 0.6  PROT 7.8  ALBUMIN 3.9    No results for input(s): LIPASE, AMYLASE in the last 168 hours. No results for input(s): AMMONIA in the last 168 hours. Coagulation Profile: No results for input(s): INR, PROTIME in the last 168 hours. Cardiac Enzymes: No results for input(s): CKTOTAL, CKMB, CKMBINDEX, TROPONINI in the last 168 hours. BNP (last 3 results) No results for input(s): PROBNP in the last 8760 hours. HbA1C: No results for input(s): HGBA1C in the last 72 hours.  CBG: Recent Labs  Lab 06/08/21 0710 06/08/21 1119 06/08/21 1636 06/08/21 2043 06/09/21 0831  GLUCAP 269* 221* 185* 239* 283*    Lipid Profile: No results for input(s): CHOL, HDL, LDLCALC, TRIG, CHOLHDL, LDLDIRECT in the last 72 hours. Thyroid Function Tests: No results for input(s): TSH, T4TOTAL, FREET4, T3FREE, THYROIDAB in the last 72 hours. Anemia Panel: No results for input(s): VITAMINB12, FOLATE, FERRITIN, TIBC, IRON, RETICCTPCT in the last 72 hours. Sepsis Labs: Recent Labs  Lab 06/05/21 2048 06/05/21 2305  LATICACIDVEN 2.4* 1.9     Recent Results (from the past 240 hour(s))  Resp Panel by RT-PCR (Flu A&B, Covid) Peripheral     Status: None   Collection Time: 06/06/21 12:09 AM   Specimen: Peripheral; Nasopharyngeal(NP) swabs in vial transport medium   Result Value Ref Range Status   SARS Coronavirus 2 by RT PCR NEGATIVE NEGATIVE Final    Comment: (NOTE) SARS-CoV-2 target nucleic acids are NOT DETECTED.  The SARS-CoV-2 RNA is generally detectable in upper respiratory specimens during the acute phase of infection. The lowest concentration of SARS-CoV-2 viral copies this assay can detect is 138 copies/mL. A negative result does not preclude SARS-Cov-2 infection and should not be used as the sole basis for treatment or other patient management decisions. A negative result may occur with  improper specimen collection/handling, submission of specimen other than  nasopharyngeal swab, presence of viral mutation(s) within the areas targeted by this assay, and inadequate number of viral copies(<138 copies/mL). A negative result must be combined with clinical observations, patient history, and epidemiological information. The expected result is Negative.  Fact Sheet for Patients:  BloggerCourse.com  Fact Sheet for Healthcare Providers:  SeriousBroker.it  This test is no t yet approved or cleared by the [place] FDA and  has been authorized for detection and/or diagnosis of SARS-CoV-2 by FDA under an Emergency Use Authorization (EUA). This EUA will remain  in effect (meaning this test can be used) for the duration of the COVID-19 declaration under Section 564(b)(1) of the Act, 21 U.S.C.section 360bbb-3(b)(1), unless the authorization is terminated  or revoked sooner.       Influenza A by PCR NEGATIVE NEGATIVE Final   Influenza B by PCR NEGATIVE NEGATIVE Final    Comment: (NOTE) The Xpert Xpress SARS-CoV-2/FLU/RSV plus assay is intended as an aid in the diagnosis of influenza from Nasopharyngeal swab specimens and should not be used as a sole basis for treatment. Nasal washings and aspirates are unacceptable for Xpert Xpress SARS-CoV-2/FLU/RSV testing.  Fact Sheet for  Patients: BloggerCourse.com  Fact Sheet for Healthcare Providers: SeriousBroker.it  This test is not yet approved or cleared by the [place] FDA and has been authorized for detection and/or diagnosis of SARS-CoV-2 by FDA under an Emergency Use Authorization (EUA). This EUA will remain in effect (meaning this test can be used) for the duration of the COVID-19 declaration under Section 564(b)(1) of the Act, 21 U.S.C. section 360bbb-3(b)(1), unless the authorization is terminated or revoked.  Performed at [facility], [address], [place], [place] [address]   Blood culture (routine x 2)     Status: None (Preliminary result)   Collection Time: 06/06/21 12:30 AM   Specimen: BLOOD  Result Value Ref Range Status   Specimen Description   Final    BLOOD BLOOD LEFT WRIST Performed at [facility], [address] [place], [place] [address]    Special Requests   Final    BOTTLES DRAWN AEROBIC ONLY Blood Culture results may not be optimal due to an inadequate volume of blood received in culture bottles Performed at [facility], [address], [place], [place] [address]    Culture   Final    NO GROWTH 2 DAYS Performed at [facility], [address], [place], [place] [address]    Report Status PENDING  Incomplete  Blood culture (routine x 2)     Status: None (Preliminary result)   Collection Time: 06/06/21 12:30 AM   Specimen: BLOOD  Result Value Ref Range Status   Specimen Description   Final    BLOOD BLOOD LEFT FOREARM Performed at [facility], [address], [place], [place] [address]    Special Requests   Final    BOTTLES DRAWN AEROBIC AND ANAEROBIC Blood Culture adequate volume Performed at [facility], [address] [place], [place] [address]    Culture   Final    NO GROWTH 2 DAYS Performed at [facility], [address] [place], [place] [address]    Report Status PENDING  Incomplete          Radiology Studies: No results found.      Scheduled Meds:  enoxaparin (LOVENOX) injection  40 mg Subcutaneous Q24H   insulin aspart  0-9 Units Subcutaneous TID WC  insulin aspart  7 Units Subcutaneous TID WC   insulin glargine-yfgn  30 Units Subcutaneous Daily   Continuous Infusions:  vancomycin Stopped (06/09/21 0906)          Glade Lloyd, MD [facility] 06/09/2021, 10:29 AM

## 2021-06-09 NOTE — Progress Notes (Signed)
Hypoglycemic Event  CBG: 62  Treatment: 8 oz juice/soda  Symptoms: Pale, Sweaty, and Shaky  Follow-up CBG: Time:1547 CBG Result:85  Possible Reasons for Event:  low carb intake with lunch and medication changes  Comments/MD notified: Md notified and medications adjusted.    Sherren Kerns

## 2021-06-09 NOTE — Plan of Care (Signed)
  Problem: Clinical Measurements: Goal: Diagnostic test results will improve Outcome: Progressing   Problem: Clinical Measurements: Goal: Respiratory complications will improve Outcome: Progressing   Problem: Clinical Measurements: Goal: Cardiovascular complication will be avoided Outcome: Progressing   Problem: Elimination: Goal: Will not experience complications related to bowel motility Outcome: Progressing   Problem: Skin Integrity: Goal: Risk for impaired skin integrity will decrease Outcome: Progressing   

## 2021-06-09 NOTE — Progress Notes (Signed)
Pt HS BG = 429, pt asymptomatic, no signs of distress, on-call NP notified, Novolog 2 units ordered and administered. Will monitor closely

## 2021-06-09 NOTE — Progress Notes (Signed)
Pharmacy Antibiotic Note  Joseph Barr is a 25 y.o. male admitted on 06/05/2021 with pain,redness and swelling to the left lower leg.  Patient reported that he got a tattoo in the region ~ 2 weeks ago.  He's currently on vancomycin for cellulitis.  Today, 06/09/2021: - day #4 abx - afeb, wbc 8.7 on 10/8 - all cultures have been negative thus far  Plan: - continue vancomycin 750mg  q8h  for est AUC 503 - will plan on checking levels in the next 1-2 days if to cont with abx  _____________________________________ Height: 6' (182.9 cm) Weight: 70.3 kg (155 lb) IBW/kg (Calculated) : 77.6  Temp (24hrs), Avg:98.3 F (36.8 C), Min:98.2 F (36.8 C), Max:98.3 F (36.8 C)  Recent Labs  Lab 06/05/21 2048 06/05/21 2305 06/06/21 0508 06/07/21 0419 06/08/21 0439  WBC 14.4*  --  12.4* 11.2* 8.7  CREATININE 1.10  --  0.76 0.70 0.69  LATICACIDVEN 2.4* 1.9  --   --   --      Estimated Creatinine Clearance: 139.1 mL/min (by C-G formula based on SCr of 0.69 mg/dL).    No Known Allergies  Antimicrobials this admission: 10/5 Cefepime & Doxy po x1 10/5 vanc >>     Microbiology results: 10/6 BCx x2:   Thank you for allowing pharmacy to be a part of this patient's care.  12/6, PharmD, BCPS 06/09/2021 11:54 AM

## 2021-06-10 ENCOUNTER — Other Ambulatory Visit: Payer: Self-pay

## 2021-06-10 LAB — GLUCOSE, CAPILLARY: Glucose-Capillary: 328 mg/dL — ABNORMAL HIGH (ref 70–99)

## 2021-06-10 MED ORDER — DOXYCYCLINE HYCLATE 100 MG PO TABS
100.0000 mg | ORAL_TABLET | Freq: Two times a day (BID) | ORAL | 0 refills | Status: AC
  Filled 2021-06-10: qty 10, 5d supply, fill #0

## 2021-06-10 MED ORDER — INSULIN ASPART 100 UNIT/ML IJ SOLN: 7.0000 [IU] | Freq: Three times a day (TID) | INTRAMUSCULAR | Status: DC

## 2021-06-10 NOTE — Discharge Summary (Signed)
Physician Discharge Summary  Joseph Barr QQV:[id number] DOB: February 16, 1996 DOA: 06/05/2021  PCP: [name], NP  Admit date: 06/05/2021 Discharge date: 06/10/2021  Admitted From: Home Disposition: Home  Recommendations for Outpatient Follow-up:  Follow up with PCP in 1 week with repeat CBC/BMP Follow up in ED if symptoms worsen or new appear   Home Health: No Equipment/Devices: None  Discharge Condition: Stable CODE STATUS: Full Diet recommendation: Carb modified  Brief/Interim Summary: 25 year old male with history of poorly controlled type 1 diabetes mellitus presented with worsening redness and swelling involving the left lower leg after a tattoo 3 weeks ago.  On presentation, he was tachycardic to 120s with hyperglycemia and leukocytosis and initial lactic acid of 2.4.  He was admitted and started on IV and vancomycin for purulent cellulitis.  His cellulitis slowly improved and patient is currently afebrile, hemodynamically stable with no leukocytosis.  She will be discharged home today on oral doxycycline for 5 more days.  Discharge Diagnoses:   Left lower extremity cellulitis -Lower extremity ultrasound showed no DVT but showed a tiny fluid collection, not drainable likely.  Left lower extremity x-ray was normal. -Currently on IV vancomycin; erythema improving very slowly but currently much improved.   -Discharged home today on oral doxycycline for 5 more days.  Diabetes mellitus type 1 with hyperglycemia and hypoglycemia -Carb modified diet.  Continue home regimen.  Outpatient follow-up with PCP -A1c 14   Leukocytosis - Resolved.  Hypokalemia -Improved  Hyponatremia -Resolved  Discharge Instructions  Discharge Instructions     Diet Carb Modified   Complete by: As directed    Increase activity slowly   Complete by: As directed       Allergies as of 06/10/2021   No Known Allergies      Medication List     STOP taking these medications     ondansetron 4 MG tablet Commonly known as: Zofran   pantoprazole 40 MG tablet Commonly known as: Protonix       TAKE these medications    doxycycline 100 MG capsule Commonly known as: VIBRAMYCIN Take 1 capsule (100 mg total) by mouth 2 (two) times daily for 5 days.   glucose blood test strip Commonly known as: Accu-Chek Aviva 1 each by Other route 4 (four) times daily. And lancets 250.03   True Metrix Blood Glucose Test test strip Generic drug: glucose blood Use as instructed   Insulin Pen Needle 31G X 5 MM Misc Use as directed in the morning, at noon, and at bedtime.   Lantus SoloStar 100 UNIT/ML Solostar Pen Generic drug: insulin glargine Inject 36 Units into the skin daily. What changed:  how much to take when to take this   NovoLOG FlexPen 100 UNIT/ML FlexPen Generic drug: insulin aspart Inject 12-20 Units into the skin 3 (three) times daily with meals. Sliding scale   True Metrix Meter w/Device Kit Use as directed   TRUEplus Lancets 28G Misc Use as directed        Follow-up Information     [place] DEPT. Go to .   Specialty: Emergency Medicine Why: If symptoms worsen Contact information: [place] [phone] [place]        [name], NP. Schedule an appointment as soon as possible for a visit in 1 week(s).   Specialty: Nurse Practitioner Contact information: [address] [place] [address] [phone]  No Known Allergies  Consultations: None   Procedures/Studies: DG Tibia/Fibula Left  Result Date: 06/05/2021 CLINICAL DATA:  Infection. attoo on lateral side of ankle three weeks ago, states it began to have pus pockets a few days after he got it and leg has been swollen and red and painful for past few days,entire distal tib fib has cellulitis EXAM: LEFT TIBIA AND FIBULA - 2 VIEW; LEFT ANKLE - 2 VIEW COMPARISON:   None. FINDINGS: No cortical erosion or destruction. No evidence of fracture, dislocation, or joint effusion. No evidence of severe arthropathy. Visualized left knee grossly unremarkable. Visualized bones of the left foot grossly unremarkable. No aggressive appearing focal bone abnormality. Subcutaneus soft tissue edema of the anterolateral leg. No retained radiopaque foreign body. IMPRESSION: 1. Negative for acute traumatic injury of the bones. 2. Subcutaneus soft tissue edema of the anterolateral leg. No retained radiopaque foreign body. Electronically Signed   By: Iven Finn M.D.   On: 06/05/2021 23:21   DG Ankle 2 Views Left  Result Date: 06/05/2021 CLINICAL DATA:  Infection. attoo on lateral side of ankle three weeks ago, states it began to have pus pockets a few days after he got it and leg has been swollen and red and painful for past few days,entire distal tib fib has cellulitis EXAM: LEFT TIBIA AND FIBULA - 2 VIEW; LEFT ANKLE - 2 VIEW COMPARISON:  None. FINDINGS: No cortical erosion or destruction. No evidence of fracture, dislocation, or joint effusion. No evidence of severe arthropathy. Visualized left knee grossly unremarkable. Visualized bones of the left foot grossly unremarkable. No aggressive appearing focal bone abnormality. Subcutaneus soft tissue edema of the anterolateral leg. No retained radiopaque foreign body. IMPRESSION: 1. Negative for acute traumatic injury of the bones. 2. Subcutaneus soft tissue edema of the anterolateral leg. No retained radiopaque foreign body. Electronically Signed   By: Iven Finn M.D.   On: 06/05/2021 23:21   [place] LT LOWER EXTREM LTD SOFT TISSUE NON VASCULAR  Result Date: 06/06/2021 CLINICAL DATA:  Left leg pain for 2 days, findings suggestive of cellulitis EXAM: ULTRASOUND LEFT LOWER EXTREMITY LIMITED TECHNIQUE: Ultrasound examination of the lower extremity soft tissues was performed in the area of clinical concern. COMPARISON:  None. FINDINGS:  Scanning in the area of clinical concern shows significant subcutaneous edema as well as a more focal fluid collection measuring 5 x 5 x 7 mm in dimension. This may represent a small subcutaneous abscess. IMPRESSION: Subcutaneous edema consistent with the given clinical history of cellulitis. A focal 5 x 7 mm area of fluid is noted which may represent a tiny subcutaneous abscess. Electronically Signed   By: Inez Catalina M.D.   On: 06/06/2021 01:45      Subjective: Patient seen and examined at bedside.  He feels much better and thinks that he is ready to go home today.  Denies worsening neck pain, nausea, vomiting or fever.  Discharge Exam: Vitals:   06/10/21 0209 06/10/21 0406  BP:  114/75  Pulse:  81  Resp:    Temp: 97.7 F (36.5 C) 97.9 F (36.6 C)  SpO2:  96%    General: Pt is alert, awake, not in acute distress Cardiovascular: rate controlled, S1/S2 + Respiratory: bilateral decreased breath sounds at bases Abdominal: Soft, NT, ND, bowel sounds + Extremities: Left lower extremity swelling and erythema have much improved; there is a small dressing near the left ankle.  No cyanosis    The results of significant diagnostics from this hospitalization (including imaging,  microbiology, ancillary and laboratory) are listed below for reference.     Microbiology: Recent Results (from the past 240 hour(s))  Resp Panel by RT-PCR (Flu A&B, Covid) Peripheral     Status: None   Collection Time: 06/06/21 12:09 AM   Specimen: Peripheral; Nasopharyngeal(NP) swabs in vial transport medium  Result Value Ref Range Status   SARS Coronavirus 2 by RT PCR NEGATIVE NEGATIVE Final    Comment: (NOTE) SARS-CoV-2 target nucleic acids are NOT DETECTED.  The SARS-CoV-2 RNA is generally detectable in upper respiratory specimens during the acute phase of infection. The lowest concentration of SARS-CoV-2 viral copies this assay can detect is 138 copies/mL. A negative result does not preclude  SARS-Cov-2 infection and should not be used as the sole basis for treatment or other patient management decisions. A negative result may occur with  improper specimen collection/handling, submission of specimen other than nasopharyngeal swab, presence of viral mutation(s) within the areas targeted by this assay, and inadequate number of viral copies(<138 copies/mL). A negative result must be combined with clinical observations, patient history, and epidemiological information. The expected result is Negative.  Fact Sheet for Patients:  EntrepreneurPulse.com.au  Fact Sheet for Healthcare Providers:  IncredibleEmployment.be  This test is no t yet approved or cleared by the [place] FDA and  has been authorized for detection and/or diagnosis of SARS-CoV-2 by FDA under an Emergency Use Authorization (EUA). This EUA will remain  in effect (meaning this test can be used) for the duration of the COVID-19 declaration under Section 564(b)(1) of the Act, 21 U.S.C.section 360bbb-3(b)(1), unless the authorization is terminated  or revoked sooner.       Influenza A by PCR NEGATIVE NEGATIVE Final   Influenza B by PCR NEGATIVE NEGATIVE Final    Comment: (NOTE) The Xpert Xpress SARS-CoV-2/FLU/RSV plus assay is intended as an aid in the diagnosis of influenza from Nasopharyngeal swab specimens and should not be used as a sole basis for treatment. Nasal washings and aspirates are unacceptable for Xpert Xpress SARS-CoV-2/FLU/RSV testing.  Fact Sheet for Patients: EntrepreneurPulse.com.au  Fact Sheet for Healthcare Providers: IncredibleEmployment.be  This test is not yet approved or cleared by the [place] FDA and has been authorized for detection and/or diagnosis of SARS-CoV-2 by FDA under an Emergency Use Authorization (EUA). This EUA will remain in effect (meaning this test can be used) for the duration of  the COVID-19 declaration under Section 564(b)(1) of the Act, 21 U.S.C. section 360bbb-3(b)(1), unless the authorization is terminated or revoked.  Performed at [facility], [place] [address] [place], [place] [address]   Blood culture (routine x 2)     Status: None (Preliminary result)   Collection Time: 06/06/21 12:30 AM   Specimen: BLOOD  Result Value Ref Range Status   Specimen Description   Final    BLOOD BLOOD LEFT WRIST Performed at [facility] [address], [place], [place] [address]    Special Requests   Final    BOTTLES DRAWN AEROBIC ONLY Blood Culture results may not be optimal due to an inadequate volume of blood received in culture bottles Performed at [place] [address] [place], [place] [address]    Culture   Final    NO GROWTH 4 DAYS Performed at [facility], [place] [address] [place], [place] [address]    Report Status PENDING  Incomplete  Blood culture (routine x 2)     Status: None (Preliminary result)  Collection Time: 06/06/21 12:30 AM   Specimen: BLOOD  Result Value Ref Range Status   Specimen Description   Final    BLOOD BLOOD LEFT FOREARM Performed at [facility] [address] [place], [place] [address]    Special Requests   Final    BOTTLES DRAWN AEROBIC AND ANAEROBIC Blood Culture adequate volume Performed at [place] [address], [place], [place] [address]    Culture   Final    NO GROWTH 4 DAYS Performed at [facility], [place] [address], [place], [place] [address]    Report Status PENDING  Incomplete     Labs: BNP (last 3 results) No results for input(s): BNP in the last 8760 hours. Basic Metabolic Panel: Recent Labs  Lab 06/05/21 2048 06/06/21 0508 06/07/21 0419 06/08/21 0439  NA 125* 139 137 132*  K 3.8 3.8 3.4* 4.2  CL 90* 103 101 97*  CO2 '26 30 28 28  ' GLUCOSE 537* 153* 123* 275*  BUN '13 10 7 11   ' CREATININE 1.10 0.76 0.70 0.69  CALCIUM 9.2 9.2 8.9 9.1  MG  --   --   --  1.9   Liver Function Tests: Recent Labs  Lab 06/05/21 2048  AST 16  ALT 13  ALKPHOS 85  BILITOT 0.6  PROT 7.8  ALBUMIN 3.9   No results for input(s): LIPASE, AMYLASE in the last 168 hours. No results for input(s): AMMONIA in the last 168 hours. CBC: Recent Labs  Lab 06/05/21 2048 06/06/21 0508 06/07/21 0419 06/08/21 0439  WBC 14.4* 12.4* 11.2* 8.7  NEUTROABS 10.9*  --   --   --   HGB 13.6 12.4* 11.7* 12.0*  HCT 39.0 35.3* 33.4* 34.3*  MCV 84.4 84.4 84.8 84.5  PLT 367 316 321 349   Cardiac Enzymes: No results for input(s): CKTOTAL, CKMB, CKMBINDEX, TROPONINI in the last 168 hours. BNP: Invalid input(s): POCBNP CBG: Recent Labs  Lab 06/09/21 1532 06/09/21 1547 06/09/21 2100 06/09/21 2346 06/10/21 0720  GLUCAP 62* 85 429* 295* 328*   D-Dimer No results for input(s): DDIMER in the last 72 hours. Hgb A1c No results for input(s): HGBA1C in the last 72 hours. Lipid Profile No results for input(s): CHOL, HDL, LDLCALC, TRIG, CHOLHDL, LDLDIRECT in the last 72 hours. Thyroid function studies No results for input(s): TSH, T4TOTAL, T3FREE, THYROIDAB in the last 72 hours.  Invalid input(s): FREET3 Anemia work up No results for input(s): VITAMINB12, FOLATE, FERRITIN, TIBC, IRON, RETICCTPCT in the last 72 hours. Urinalysis    Component Value Date/Time   COLORURINE YELLOW 06/06/2021 0227   APPEARANCEUR CLEAR 06/06/2021 0227   LABSPEC 1.037 (H) 06/06/2021 0227   PHURINE 6.0 06/06/2021 0227   GLUCOSEU >=500 (A) 06/06/2021 0227   HGBUR NEGATIVE 06/06/2021 0227   BILIRUBINUR NEGATIVE 06/06/2021 0227   BILIRUBINUR Negative 08/01/2016 0950   KETONESUR 5 (A) 06/06/2021 0227   PROTEINUR NEGATIVE 06/06/2021 0227   UROBILINOGEN 0.2 08/01/2016 0950   UROBILINOGEN 0.2 04/24/2015 1305   NITRITE NEGATIVE 06/06/2021 0227   LEUKOCYTESUR NEGATIVE 06/06/2021 0227   Sepsis Labs Invalid input(s):  PROCALCITONIN,  WBC,  LACTICIDVEN Microbiology Recent Results (from the past 240 hour(s))  Resp Panel by RT-PCR (Flu A&B, Covid) Peripheral     Status: None   Collection Time: 06/06/21 12:09 AM   Specimen: Peripheral; Nasopharyngeal(NP) swabs in vial transport medium  Result Value Ref Range Status   SARS Coronavirus 2 by RT PCR NEGATIVE NEGATIVE Final  Comment: (NOTE) SARS-CoV-2 target nucleic acids are NOT DETECTED.  The SARS-CoV-2 RNA is generally detectable in upper respiratory specimens during the acute phase of infection. The lowest concentration of SARS-CoV-2 viral copies this assay can detect is 138 copies/mL. A negative result does not preclude SARS-Cov-2 infection and should not be used as the sole basis for treatment or other patient management decisions. A negative result may occur with  improper specimen collection/handling, submission of specimen other than nasopharyngeal swab, presence of viral mutation(s) within the areas targeted by this assay, and inadequate number of viral copies(<138 copies/mL). A negative result must be combined with clinical observations, patient history, and epidemiological information. The expected result is Negative.  Fact Sheet for Patients:  EntrepreneurPulse.com.au  Fact Sheet for Healthcare Providers:  IncredibleEmployment.be  This test is no t yet approved or cleared by the [place] FDA and  has been authorized for detection and/or diagnosis of SARS-CoV-2 by FDA under an Emergency Use Authorization (EUA). This EUA will remain  in effect (meaning this test can be used) for the duration of the COVID-19 declaration under Section 564(b)(1) of the Act, 21 U.S.C.section 360bbb-3(b)(1), unless the authorization is terminated  or revoked sooner.       Influenza A by PCR NEGATIVE NEGATIVE Final   Influenza B by PCR NEGATIVE NEGATIVE Final    Comment: (NOTE) The Xpert Xpress SARS-CoV-2/FLU/RSV  plus assay is intended as an aid in the diagnosis of influenza from Nasopharyngeal swab specimens and should not be used as a sole basis for treatment. Nasal washings and aspirates are unacceptable for Xpert Xpress SARS-CoV-2/FLU/RSV testing.  Fact Sheet for Patients: EntrepreneurPulse.com.au  Fact Sheet for Healthcare Providers: IncredibleEmployment.be  This test is not yet approved or cleared by the [place] FDA and has been authorized for detection and/or diagnosis of SARS-CoV-2 by FDA under an Emergency Use Authorization (EUA). This EUA will remain in effect (meaning this test can be used) for the duration of the COVID-19 declaration under Section 564(b)(1) of the Act, 21 U.S.C. section 360bbb-3(b)(1), unless the authorization is terminated or revoked.  Performed at [facility], [place] [address], [place], [place] [address]   Blood culture (routine x 2)     Status: None (Preliminary result)   Collection Time: 06/06/21 12:30 AM   Specimen: BLOOD  Result Value Ref Range Status   Specimen Description   Final    BLOOD BLOOD LEFT WRIST Performed at [facility] [address], [place], [place] [address]    Special Requests   Final    BOTTLES DRAWN AEROBIC ONLY Blood Culture results may not be optimal due to an inadequate volume of blood received in culture bottles Performed at [facility] [address], [place], [place] [address]    Culture   Final    NO GROWTH 4 DAYS Performed at [facility], [place] [address] [place], [place] [address]    Report Status PENDING  Incomplete  Blood culture (routine x 2)     Status: None (Preliminary result)   Collection Time: 06/06/21 12:30 AM   Specimen: BLOOD  Result Value Ref Range Status   Specimen Description   Final    BLOOD BLOOD LEFT FOREARM Performed at [place] [address], [place],   [address]    Special Requests   Final    BOTTLES DRAWN AEROBIC AND ANAEROBIC Blood Culture adequate volume Performed at [facility] [address] [place],  [place] [address]    Culture   Final    NO GROWTH 4 DAYS Performed at [facility], [place] [address] [place], [place] [address]    Report Status PENDING  Incomplete     Time coordinating discharge: 35 minutes  SIGNED:   Aline August, MD  [facility] 06/10/2021, 9:33 AM

## 2021-06-11 ENCOUNTER — Telehealth: Payer: Self-pay

## 2021-06-11 LAB — CULTURE, BLOOD (ROUTINE X 2)
Culture: NO GROWTH
Culture: NO GROWTH
Special Requests: ADEQUATE

## 2021-06-11 NOTE — Telephone Encounter (Signed)
Transition Care Management Unsuccessful Follow-up Telephone Call  Date of discharge and from where:  06/10/2021  [facility]  Attempts:  1st Attempt  Reason for unsuccessful TCM follow-up call:  Missing or invalid number  Vidas Pavy, RN, BSN, [facility] [employer] [phone]

## 2021-06-11 NOTE — Telephone Encounter (Signed)
Transition Care Management Unsuccessful Follow-up Telephone Call  Date of discharge and from where:  06/10/2021, [facility]   Attempts:  1st Attempt  Reason for unsuccessful TCM follow-up call:  Unable to reach patient - attempted twice to call patient # [phone], phone rings fast busy.  He needs to schedule a hospital follow up appointment

## 2021-06-12 ENCOUNTER — Telehealth: Payer: Self-pay

## 2021-06-12 NOTE — Telephone Encounter (Signed)
Transition Care Management Unsuccessful Follow-up Telephone Call  Date of discharge and from where:  06/10/2021, [facility]   Attempts:  2nd Attempt  Reason for unsuccessful TCM follow-up call:  Unable to reach patient - attempted twice to call patient # [phone], phone rings fast busy.   He needs to schedule a hospital follow up

## 2021-06-13 ENCOUNTER — Telehealth: Payer: Self-pay

## 2021-06-13 NOTE — Telephone Encounter (Signed)
Transition Care Management Unsuccessful Follow-up Telephone Call  Date of discharge and from where:  06/10/2021, [facility]  Attempts:  3rd Attempt  Reason for unsuccessful TCM follow-up call:  Unable to reach patient  attempted to call patient # [phone], phone rings fast busy.   He needs to schedule a hospital follow up. Letter sent to patient requesting he contact [facility] to schedule this appointment

## 2021-06-17 ENCOUNTER — Other Ambulatory Visit: Payer: Self-pay

## 2021-07-05 ENCOUNTER — Other Ambulatory Visit: Payer: Self-pay | Admitting: Nurse Practitioner

## 2021-07-05 ENCOUNTER — Other Ambulatory Visit: Payer: Self-pay

## 2021-07-05 DIAGNOSIS — E1010 Type 1 diabetes mellitus with ketoacidosis without coma: Secondary | ICD-10-CM

## 2021-07-05 MED ORDER — INSULIN PEN NEEDLE 31G X 5 MM MISC
100.0000 | Freq: Three times a day (TID) | 1 refills | Status: DC
  Filled 2021-07-05 – 2021-09-23 (×3): qty 100, 33d supply, fill #0

## 2021-07-05 MED ORDER — LANTUS SOLOSTAR 100 UNIT/ML ~~LOC~~ SOPN
36.0000 [IU] | PEN_INJECTOR | Freq: Every day | SUBCUTANEOUS | 1 refills | Status: DC
Start: 2021-07-05 — End: 2021-12-12
  Filled 2021-07-05 – 2021-09-23 (×3): qty 12, 33d supply, fill #0
  Filled 2021-12-03: qty 12, 33d supply, fill #1

## 2021-07-05 NOTE — Telephone Encounter (Signed)
Requested Prescriptions  Pending Prescriptions Disp Refills  . Insulin Pen Needle 31G X 5 MM MISC 100 each 1    Sig: Use as directed in the morning, at noon, and at bedtime.     Endocrinology: Diabetes - Testing Supplies Passed - 07/05/2021  9:50 AM      Passed - Valid encounter within last 12 months    Recent Outpatient Visits          1 year ago Type 1 diabetes mellitus with ketoacidosis without coma [facility])   [place] [facility] [place], [place] W, NP   1 year ago Type 1 diabetes mellitus with ketoacidosis without coma [facility])   [place] [facility] [place], [place], [place]   4 years ago Type 1 diabetes mellitus with ketoacidosis without coma [facility])   [place] [facility] [place], [place], FNP   6 years ago Type 1 diabetes mellitus with ketoacidosis and without coma   [place] [facility] [place], Odette Horns, MD   7 years ago Type 1 diabetes mellitus with hyperglycemia   [facility] Doris [place]head[place], MD      Future Appointments            In 3 weeks Claiborne Rigg, NP [facility]

## 2021-07-08 ENCOUNTER — Other Ambulatory Visit: Payer: Self-pay

## 2021-07-31 ENCOUNTER — Inpatient Hospital Stay: Payer: Medicaid Other | Admitting: Nurse Practitioner

## 2021-08-27 ENCOUNTER — Emergency Department (HOSPITAL_COMMUNITY): Payer: Self-pay

## 2021-08-27 ENCOUNTER — Inpatient Hospital Stay (HOSPITAL_COMMUNITY)
Admission: EM | Admit: 2021-08-27 | Discharge: 2021-08-30 | DRG: 871 | Disposition: A | Discharge status: Home / Self Care | Payer: Self-pay | Attending: Internal Medicine | Admitting: Internal Medicine

## 2021-08-27 ENCOUNTER — Other Ambulatory Visit: Payer: Self-pay

## 2021-08-27 ENCOUNTER — Encounter (HOSPITAL_COMMUNITY): Payer: Self-pay | Admitting: Student

## 2021-08-27 DIAGNOSIS — K209 Esophagitis, unspecified without bleeding: Secondary | ICD-10-CM | POA: Diagnosis present

## 2021-08-27 DIAGNOSIS — E861 Hypovolemia: Secondary | ICD-10-CM | POA: Diagnosis present

## 2021-08-27 DIAGNOSIS — J9601 Acute respiratory failure with hypoxia: Secondary | ICD-10-CM | POA: Diagnosis present

## 2021-08-27 DIAGNOSIS — E1010 Type 1 diabetes mellitus with ketoacidosis without coma: Secondary | ICD-10-CM

## 2021-08-27 DIAGNOSIS — R000 Tachycardia, unspecified: Secondary | ICD-10-CM

## 2021-08-27 DIAGNOSIS — R739 Hyperglycemia, unspecified: Secondary | ICD-10-CM

## 2021-08-27 DIAGNOSIS — A419 Sepsis, unspecified organism: Principal | ICD-10-CM | POA: Diagnosis present

## 2021-08-27 DIAGNOSIS — J1000 Influenza due to other identified influenza virus with unspecified type of pneumonia: Secondary | ICD-10-CM | POA: Diagnosis present

## 2021-08-27 DIAGNOSIS — R6520 Severe sepsis without septic shock: Secondary | ICD-10-CM | POA: Diagnosis present

## 2021-08-27 DIAGNOSIS — N179 Acute kidney failure, unspecified: Secondary | ICD-10-CM | POA: Diagnosis present

## 2021-08-27 DIAGNOSIS — Z20822 Contact with and (suspected) exposure to covid-19: Secondary | ICD-10-CM | POA: Diagnosis present

## 2021-08-27 DIAGNOSIS — J189 Pneumonia, unspecified organism: Secondary | ICD-10-CM

## 2021-08-27 DIAGNOSIS — Z794 Long term (current) use of insulin: Secondary | ICD-10-CM

## 2021-08-27 DIAGNOSIS — E876 Hypokalemia: Secondary | ICD-10-CM | POA: Diagnosis not present

## 2021-08-27 DIAGNOSIS — E870 Hyperosmolality and hypernatremia: Secondary | ICD-10-CM | POA: Diagnosis not present

## 2021-08-27 DIAGNOSIS — E860 Dehydration: Secondary | ICD-10-CM

## 2021-08-27 DIAGNOSIS — R112 Nausea with vomiting, unspecified: Secondary | ICD-10-CM

## 2021-08-27 DIAGNOSIS — Z6820 Body mass index (BMI) 20.0-20.9, adult: Secondary | ICD-10-CM

## 2021-08-27 DIAGNOSIS — J101 Influenza due to other identified influenza virus with other respiratory manifestations: Secondary | ICD-10-CM

## 2021-08-27 DIAGNOSIS — R636 Underweight: Secondary | ICD-10-CM | POA: Diagnosis present

## 2021-08-27 DIAGNOSIS — F17290 Nicotine dependence, other tobacco product, uncomplicated: Secondary | ICD-10-CM | POA: Diagnosis present

## 2021-08-27 LAB — CBC WITH DIFFERENTIAL/PLATELET
Abs Immature Granulocytes: 0.1 10*3/uL — ABNORMAL HIGH (ref 0.00–0.07)
Basophils Absolute: 0.1 10*3/uL (ref 0.0–0.1)
Basophils Relative: 0 %
Eosinophils Absolute: 0 10*3/uL (ref 0.0–0.5)
Eosinophils Relative: 0 %
HCT: 45.3 % (ref 39.0–52.0)
Hemoglobin: 15.3 g/dL (ref 13.0–17.0)
Immature Granulocytes: 1 %
Lymphocytes Relative: 9 %
Lymphs Abs: 1.5 10*3/uL (ref 0.7–4.0)
MCH: 29 pg (ref 26.0–34.0)
MCHC: 33.8 g/dL (ref 30.0–36.0)
MCV: 86 fL (ref 80.0–100.0)
Monocytes Absolute: 2 10*3/uL — ABNORMAL HIGH (ref 0.1–1.0)
Monocytes Relative: 12 %
Neutro Abs: 12.8 10*3/uL — ABNORMAL HIGH (ref 1.7–7.7)
Neutrophils Relative %: 78 %
Platelets: 374 10*3/uL (ref 150–400)
RBC: 5.27 MIL/uL (ref 4.22–5.81)
RDW: 13.2 % (ref 11.5–15.5)
WBC: 16.4 10*3/uL — ABNORMAL HIGH (ref 4.0–10.5)
nRBC: 0 % (ref 0.0–0.2)

## 2021-08-27 LAB — HEMOGLOBIN A1C
Hgb A1c MFr Bld: 14.4 % — ABNORMAL HIGH (ref 4.8–5.6)
Mean Plasma Glucose: 366.58 mg/dL

## 2021-08-27 LAB — COMPREHENSIVE METABOLIC PANEL
ALT: 20 U/L (ref 0–44)
AST: 20 U/L (ref 15–41)
Albumin: 3.4 g/dL — ABNORMAL LOW (ref 3.5–5.0)
Alkaline Phosphatase: 89 U/L (ref 38–126)
Anion gap: 19 — ABNORMAL HIGH (ref 5–15)
BUN: 36 mg/dL — ABNORMAL HIGH (ref 6–20)
CO2: 23 mmol/L (ref 22–32)
Calcium: 9.2 mg/dL (ref 8.9–10.3)
Chloride: 95 mmol/L — ABNORMAL LOW (ref 98–111)
Creatinine, Ser: 1.76 mg/dL — ABNORMAL HIGH (ref 0.61–1.24)
GFR, Estimated: 54 mL/min — ABNORMAL LOW (ref >60)
Glucose, Bld: 396 mg/dL — ABNORMAL HIGH (ref 70–99)
Potassium: 4.9 mmol/L (ref 3.5–5.1)
Sodium: 137 mmol/L (ref 135–145)
Total Bilirubin: 0.9 mg/dL (ref 0.3–1.2)
Total Protein: 7.8 g/dL (ref 6.5–8.1)

## 2021-08-27 LAB — URINALYSIS, MICROSCOPIC (REFLEX): Bacteria, UA: NONE SEEN

## 2021-08-27 LAB — I-STAT VENOUS BLOOD GAS, ED
Acid-base deficit: 1 mmol/L (ref 0.0–2.0)
Bicarbonate: 24.7 mmol/L (ref 20.0–28.0)
Calcium, Ion: 1.07 mmol/L — ABNORMAL LOW (ref 1.15–1.40)
HCT: 46 % (ref 39.0–52.0)
Hemoglobin: 15.6 g/dL (ref 13.0–17.0)
O2 Saturation: 80 %
Potassium: 5.1 mmol/L (ref 3.5–5.1)
Sodium: 133 mmol/L — ABNORMAL LOW (ref 135–145)
TCO2: 26 mmol/L (ref 22–32)
pCO2, Ven: 44 mmHg (ref 44.0–60.0)
pH, Ven: 7.357 (ref 7.250–7.430)
pO2, Ven: 46 mmHg — ABNORMAL HIGH (ref 32.0–45.0)

## 2021-08-27 LAB — BASIC METABOLIC PANEL
Anion gap: 22 — ABNORMAL HIGH (ref 5–15)
Anion gap: 15 (ref 5–15)
BUN: 27 mg/dL — ABNORMAL HIGH (ref 6–20)
BUN: 22 mg/dL — ABNORMAL HIGH (ref 6–20)
CO2: 16 mmol/L — ABNORMAL LOW (ref 22–32)
CO2: 22 mmol/L (ref 22–32)
Calcium: 8.9 mg/dL (ref 8.9–10.3)
Calcium: 8.9 mg/dL (ref 8.9–10.3)
Chloride: 103 mmol/L (ref 98–111)
Chloride: 109 mmol/L (ref 98–111)
Creatinine, Ser: 1.93 mg/dL — ABNORMAL HIGH (ref 0.61–1.24)
Creatinine, Ser: 1.58 mg/dL — ABNORMAL HIGH (ref 0.61–1.24)
GFR, Estimated: 48 mL/min — ABNORMAL LOW (ref >60)
GFR, Estimated: >60 mL/min (ref >60)
Glucose, Bld: 442 mg/dL — ABNORMAL HIGH (ref 70–99)
Glucose, Bld: 228 mg/dL — ABNORMAL HIGH (ref 70–99)
Potassium: 5.3 mmol/L — ABNORMAL HIGH (ref 3.5–5.1)
Potassium: 4.6 mmol/L (ref 3.5–5.1)
Sodium: 141 mmol/L (ref 135–145)
Sodium: 146 mmol/L — ABNORMAL HIGH (ref 135–145)

## 2021-08-27 LAB — MAGNESIUM: Magnesium: 2.4 mg/dL (ref 1.7–2.4)

## 2021-08-27 LAB — URINALYSIS, ROUTINE W REFLEX MICROSCOPIC
Glucose, UA: >=500 mg/dL — AB
Ketones, ur: 40 mg/dL — AB
Leukocytes,Ua: NEGATIVE
Nitrite: NEGATIVE
Protein, ur: 100 mg/dL — AB
Specific Gravity, Urine: >1.03 — ABNORMAL HIGH (ref 1.005–1.030)
pH: 6 (ref 5.0–8.0)

## 2021-08-27 LAB — I-STAT CHEM 8, ED
BUN: 39 mg/dL — ABNORMAL HIGH (ref 6–20)
Calcium, Ion: 1.08 mmol/L — ABNORMAL LOW (ref 1.15–1.40)
Chloride: 96 mmol/L — ABNORMAL LOW (ref 98–111)
Creatinine, Ser: 1.3 mg/dL — ABNORMAL HIGH (ref 0.61–1.24)
Glucose, Bld: 563 mg/dL (ref 70–99)
HCT: 48 % (ref 39.0–52.0)
Hemoglobin: 16.3 g/dL (ref 13.0–17.0)
Potassium: 5.1 mmol/L (ref 3.5–5.1)
Sodium: 134 mmol/L — ABNORMAL LOW (ref 135–145)
TCO2: 24 mmol/L (ref 22–32)

## 2021-08-27 LAB — LIPASE, BLOOD: Lipase: 17 U/L (ref 11–51)

## 2021-08-27 LAB — OSMOLALITY: Osmolality: 329 mOsm/kg (ref 275–295)

## 2021-08-27 LAB — RESP PANEL BY RT-PCR (FLU A&B, COVID) ARPGX2
Influenza A by PCR: POSITIVE — AB
Influenza B by PCR: NEGATIVE
SARS Coronavirus 2 by RT PCR: NEGATIVE

## 2021-08-27 LAB — CBG MONITORING, ED
Glucose-Capillary: >600 mg/dL (ref 70–99)
Glucose-Capillary: 561 mg/dL (ref 70–99)
Glucose-Capillary: 417 mg/dL — ABNORMAL HIGH (ref 70–99)
Glucose-Capillary: 397 mg/dL — ABNORMAL HIGH (ref 70–99)

## 2021-08-27 LAB — GLUCOSE, CAPILLARY
Glucose-Capillary: 206 mg/dL — ABNORMAL HIGH (ref 70–99)
Glucose-Capillary: 207 mg/dL — ABNORMAL HIGH (ref 70–99)

## 2021-08-27 MED ORDER — GUAIFENESIN 100 MG/5ML PO LIQD
5.0000 mL | ORAL | Status: DC | PRN
  Administered 2021-08-29 – 2021-08-30 (×5): 5 mL via ORAL
  Filled 2021-08-27 (×5): qty 5

## 2021-08-27 MED ORDER — LACTATED RINGERS IV BOLUS
1000.0000 mL | Freq: Once | INTRAVENOUS | Status: AC
  Administered 2021-08-27: 11:00:00 1000 mL via INTRAVENOUS

## 2021-08-27 MED ORDER — FAMOTIDINE IN NACL 20-0.9 MG/50ML-% IV SOLN
20.0000 mg | Freq: Two times a day (BID) | INTRAVENOUS | Status: DC
Start: 2021-08-27 — End: 2021-08-29
  Administered 2021-08-27 – 2021-08-28 (×3): 20 mg via INTRAVENOUS
  Filled 2021-08-27 (×6): qty 50

## 2021-08-27 MED ORDER — SODIUM CHLORIDE 0.9 % IV SOLN: 10.0000 mg | Freq: Two times a day (BID) | INTRAVENOUS | Status: DC

## 2021-08-27 MED ORDER — LACTATED RINGERS IV BOLUS
1000.0000 mL | Freq: Once | INTRAVENOUS | Status: AC
  Administered 2021-08-27: 15:00:00 1000 mL via INTRAVENOUS

## 2021-08-27 MED ORDER — ACETAMINOPHEN 325 MG PO TABS
650.0000 mg | ORAL_TABLET | Freq: Four times a day (QID) | ORAL | Status: DC | PRN
  Administered 2021-08-28 – 2021-08-29 (×4): 650 mg via ORAL
  Filled 2021-08-27 (×4): qty 2

## 2021-08-27 MED ORDER — ONDANSETRON HCL 4 MG PO TABS
4.0000 mg | ORAL_TABLET | Freq: Four times a day (QID) | ORAL | Status: DC | PRN
  Administered 2021-08-28: 09:00:00 4 mg via ORAL
  Filled 2021-08-27: qty 1

## 2021-08-27 MED ORDER — LACTATED RINGERS IV BOLUS
1000.0000 mL | Freq: Once | INTRAVENOUS | Status: AC
  Administered 2021-08-27: 13:00:00 1000 mL via INTRAVENOUS

## 2021-08-27 MED ORDER — LACTATED RINGERS IV SOLN
INTRAVENOUS | Status: DC
Start: 2021-08-27 — End: 2021-08-28

## 2021-08-27 MED ORDER — ENOXAPARIN SODIUM 40 MG/0.4ML IJ SOSY: 40.0000 mg | PREFILLED_SYRINGE | INTRAMUSCULAR | Status: DC

## 2021-08-27 MED ORDER — ONDANSETRON HCL 4 MG/2ML IJ SOLN
4.0000 mg | Freq: Once | INTRAMUSCULAR | Status: AC
  Administered 2021-08-27: 13:00:00 4 mg via INTRAVENOUS
  Filled 2021-08-27: qty 2

## 2021-08-27 MED ORDER — INSULIN ASPART 100 UNIT/ML IJ SOLN
10.0000 [IU] | Freq: Once | INTRAMUSCULAR | Status: AC
  Administered 2021-08-27: 11:00:00 10 [IU] via SUBCUTANEOUS

## 2021-08-27 MED ORDER — ONDANSETRON HCL 4 MG/2ML IJ SOLN
4.0000 mg | Freq: Four times a day (QID) | INTRAMUSCULAR | Status: DC | PRN
  Administered 2021-08-28 – 2021-08-29 (×2): 4 mg via INTRAVENOUS
  Filled 2021-08-27 (×2): qty 2

## 2021-08-27 MED ORDER — LACTATED RINGERS IV BOLUS
1000.0000 mL | Freq: Once | INTRAVENOUS | Status: AC
  Administered 2021-08-27: 20:00:00 1000 mL via INTRAVENOUS

## 2021-08-27 MED ORDER — SODIUM CHLORIDE 0.9 % IV BOLUS
1000.0000 mL | Freq: Once | INTRAVENOUS | Status: AC
  Administered 2021-08-27: 06:00:00 1000 mL via INTRAVENOUS

## 2021-08-27 MED ORDER — ACETAMINOPHEN 650 MG RE SUPP: 650.0000 mg | Freq: Four times a day (QID) | RECTAL | Status: DC | PRN

## 2021-08-27 MED ORDER — INSULIN ASPART 100 UNIT/ML IJ SOLN
0.0000 [IU] | INTRAMUSCULAR | Status: DC
  Administered 2021-08-27: 16:00:00 15 [IU] via SUBCUTANEOUS
  Administered 2021-08-27 – 2021-08-28 (×2): 5 [IU] via SUBCUTANEOUS
  Administered 2021-08-28: 05:00:00 3 [IU] via SUBCUTANEOUS

## 2021-08-27 MED ORDER — INSULIN GLARGINE-YFGN 100 UNIT/ML ~~LOC~~ SOLN
30.0000 [IU] | Freq: Every day | SUBCUTANEOUS | Status: DC
Start: 2021-08-27 — End: 2021-08-27

## 2021-08-27 MED ORDER — INSULIN GLARGINE-YFGN 100 UNIT/ML ~~LOC~~ SOLN
30.0000 [IU] | Freq: Every day | SUBCUTANEOUS | Status: DC
Start: 2021-08-27 — End: 2021-08-28
  Administered 2021-08-27: 23:00:00 30 [IU] via SUBCUTANEOUS
  Filled 2021-08-27 (×2): qty 0.3

## 2021-08-27 MED ORDER — LORAZEPAM 2 MG/ML IJ SOLN
0.5000 mg | Freq: Once | INTRAMUSCULAR | Status: AC
  Administered 2021-08-27: 06:00:00 0.5 mg via INTRAVENOUS
  Filled 2021-08-27: qty 1

## 2021-08-27 MED ORDER — INSULIN GLARGINE-YFGN 100 UNIT/ML ~~LOC~~ SOLN
28.0000 [IU] | Freq: Every day | SUBCUTANEOUS | Status: DC
Start: 2021-08-27 — End: 2021-08-27

## 2021-08-27 NOTE — H&P (Addendum)
Date: 08/27/2021               Patient Name:  Joseph Barr MRN: [id number]  DOB: 08-Apr-1996 Age / Sex: 25 y.o., male   PCP: Claiborne Rigg, NP         Medical Service: Internal Medicine Teaching Service         Attending Physician: Dr. Reymundo Poll, MD    First Contact: Dr. Welton Flakes Pager: [phone]  Second Contact: Dr. Vear Clock Pager: [phone]       After Hours (After 5p/  First Contact Pager: [phone]  weekends / holidays): Second Contact Pager: [phone]   Chief Complaint: Nausea, vomiting, abd pain, flu-like syx  History of Present Illness: ADOLF ORMISTON is a 25 year old male with hx of type 1 diabetes presented to the ED c/o N/V and flu like syx for the past 12 days. Pt stated that he lives with his brother who had similar syx two weeks ago. Pt syz developed shortly after. His brother recovered after 3-4 days but pt syx persisted. He endorse subjective fevers, chills and pleurisy. Pt cough is dry. He has tried OTC cough suppressants and tylenol with minimal relief. Pt states he has been compliant with his insulin; last dose was long acting at around 2AM prior to arriving to the ED. Pt reports 2-3 episodes of NBNB emesis per day with associated abdominal "stabbing" like pain prior to vomiting. Denies constipation or diarrhea, last bowel movement yesterday afternoon. Pt unable to hold down fluids or liquids since syx onset.  Meds: Insulin glargine 36 units nightly  NovoLog 12 to 20 units 3 times daily with meals No outpatient medications have been marked as taking for the 08/27/21 encounter [facility] Encounter).     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  Review of Systems: A complete ROS was negative except as per HPI.   Physical Exam: Blood pressure 124/86, pulse (!) 135, temperature 97.8 F (36.6 C), temperature source Oral, resp. rate 18, height 6' (1.829 m), weight 68 kg, SpO2 93 %. Physical Exam Constitutional:      Appearance: He is underweight.  HENT:     Head: Normocephalic and atraumatic.  Cardiovascular:     Rate and Rhythm: Regular rhythm. Tachycardia present.     Heart sounds: Normal heart sounds.  Pulmonary:     Breath sounds: Normal air entry.  Abdominal:     General: Abdomen is flat. Bowel sounds are normal.     Tenderness: There is abdominal tenderness.  Musculoskeletal:     Right lower leg: No edema.     Left lower leg: No edema.  Skin:    General: Skin is warm and dry.  Neurological:     General: No focal deficit present.     Mental Status: He is oriented to person, place, and time. He is lethargic.  Psychiatric:        Behavior: Behavior is cooperative.     EKG: personally reviewed my interpretation is sinus tachycardia  DG Chest 2 View  Result Date: 08/27/2021 CLINICAL DATA:  Cough. EXAM: CHEST - 2 VIEW COMPARISON:  Chest  radiograph dated 02/27/2021. FINDINGS: The heart size and mediastinal contours are within normal limits. Both lungs are clear. The visualized skeletal structures are unremarkable. IMPRESSION: No active cardiopulmonary disease. Electronically Signed   By: Elgie Collard M.D.   On: 08/27/2021 03:25     Assessment & Plan by Problem: Principal Problem:   Intractable nausea and vomiting  Dehydration 2/2 Intractable Vomiting Patient endorsed 12 days of vomiting.  With 2-3 bouts of emesis per day.  Patient has tried over-the-counter medication with minimal relief.  Patient appears lethargic and dry on exam.  He endorses decreased oral intake since symptom onset.  Patient endorsed associated abdominal pain which is relieved after vomiting.  Patient has received 3  L of LR fluid since admission.  EKG significant for sinus tachycardia, which likely due to hypovolemia. --IV LR 1 L bolus; follow-up with LR 125 mL/h continuous confusion --Zofran 4 mg every 6 hours as needed --Repeat EKG  Hyperglycemia Type 1 diabetes mellitus On arrival around 2 AM patient's glucose levels were around ~600.  Last insulin dose was his long-acting prior to arriving to the emergency department, patient reports.  Initial labs significant for anion gap of 19 with bicarb within normal limits.  UA significant for ketones.  Likely due to starvation ketosis.  Patient reports her vomiting for the past 12 days with decreased oral intake.  Patient has been receiving insulin since admission and glucose levels continue to trend down.  Hemoglobin A1c 14.4%; it appears patient has poorly controlled diabetes based on A1c's over the past year which have been persistently greater than 11%.  Home dose: Lantus is 36 units and NovoLog 12 to 20 units 3 times daily with meals, which patient states he is compliant with.  Last DKA episode was 3 years ago, patient endorsed.  Patient does not appear to be in DKA, however, will repeat BMP to reassess bicarb levels.  If bicarb level decreased, will consider insulin drip.  In the meantime patient will receive 80% of his long-acting home dose and short acting. --Repeat BMP;  --Trend CBGs --NovoLog 0 to 15 units every 4 hours subcutaneously; hold insulin if patient is n.p.o. --Semglee 28 units at bedtime  Acute kidney Injury Patient presented with initial creatinine levels of 1.7.  Likely in the setting of dehydration.  Creatinine levels have improved following fluid hydration.  Patient denies any urinary changes. --Trend BMP --Continue fluids as stated above  Influenza A Patient admits sick contacts; his brother had flulike symptoms several days prior to his symptom onset.  Patient endorses dry cough, nausea and vomiting.  Patient had ongoing symptoms for the  last 12 days.  On admission, patient respiratory panel positive for influenza A. CBC significant for leukocytosis 16.4. Patient is out side of window for Tamiflu.  And will benefit from symptom management only.  Chest x-ray negative for any cardiopulmonary disease.  Patient reports shortness of breath, although saturating well on room air.  Patient also endorsed pleurisy and pain in chest with deep inspiration.  Chest pain reproducible with palpation. --Tylenol 650 every 6 as needed mild pain/fever --Guaifenesin 5 mL every 4 hours as needed for cough --Zofran 4 mg every 6 hours as needed    Dispo: Admit patient to Observation with expected length of stay less than 2 midnights.  Signed: Dellis Filbert, MD 08/27/2021, 3:49 PM  Pager: [phone] After 5pm on weekdays and 1pm on weekends: On Call pager: [phone]

## 2021-08-27 NOTE — Progress Notes (Signed)
Patient arrived to unit via stretcher. Alert and oriented, IVF infusing. VS obtained, elevated HR- MD made aware. No c/o pain at this time. No n/v at this time, emesis bag within reach. No needs at this time, call bell in reach.

## 2021-08-27 NOTE — Progress Notes (Signed)
°   08/27/21 1831  Assess: MEWS Score  BP 123/78  Pulse Rate (!) 142  Resp 16  Level of Consciousness Alert  SpO2 93 %  O2 Device Room Air  Assess: MEWS Score  MEWS Temp 0  MEWS Systolic 0  MEWS Pulse 3  MEWS RR 0  MEWS LOC 0  MEWS Score 3  MEWS Score Color Yellow  Assess: if the MEWS score is Yellow or Red  Were vital signs taken at a resting state? Yes  Focused Assessment Change from prior assessment (see assessment flowsheet)  Early Detection of Sepsis Score *See Row Information* Low  MEWS guidelines implemented *See Row Information* Yes  Treat  Pain Scale 0-10  Pain Score 0  Take Vital Signs  Increase Vital Sign Frequency  Yellow: Q 2hr X 2 then Q 4hr X 2, if remains yellow, continue Q 4hrs  Escalate  MEWS: Escalate Yellow: discuss with charge nurse/RN and consider discussing with provider and RRT  Notify: Charge Nurse/RN  Name of Charge Nurse/RN Notified Aurea Graff RN  Date Charge Nurse/RN Notified 08/27/21  Time Charge Nurse/RN Notified 1841  Notify: Provider  Provider Name/Title Dr. Ruben Im  Date Provider Notified 08/27/21  Time Provider Notified 1840  Notification Type Page  Notification Reason Other (Comment) (yellow MEWS)

## 2021-08-27 NOTE — ED Provider Notes (Addendum)
Emergency Medicine Provider Triage Evaluation Note  Joseph Barr , a 25 y.o. male  was evaluated in triage.  Pt complains of  N/V x 12 days. Reports multiple episdoes per day, worse today. Having associated nasal congestion, cough, dyspnea at times, and epigastric abdominal pain. No alleviating/aggravating factors. Given his continued vomiting called EMS tonight. Per EMS gave 500 cc fluid and 4 mg of zofran. Patient w/ hx of DM, CBG with EMS in the 200s.   Review of Systems  Positive: Per above Negative: Per above.   Physical Exam  BP 105/80    Pulse (!) 125    Resp 17    Ht 6' (1.829 m)    Wt 68 kg    SpO2 98%    BMI 20.34 kg/m  Temp 98.9 Gen:   Awake Resp:  Normal effort  MSK:   Moves extremities without difficulty  Other:  Tachycardic. Epigastric abdominal TTP. Dry MM.   Medical Decision Making  Medically screening exam initiated at 1:44 AM.  Appropriate orders placed.  Joseph Barr was informed that the remainder of the evaluation will be completed by another provider, this initial triage assessment does not replace that evaluation, and the importance of remaining in the ED until their evaluation is complete.  Vomiting.    Joseph Barr 08/27/21 2683    Joseph Baton, MD 08/27/21 0350  06:00: Patient continuing to have emesis in the waiting room- ativan ordered for this as his Qtc > 500, will check magnesium level. Patient's labs thus far reviewed- patient is notably hyperglycemic w/ elevated anion gap, AKI present, bicarb normal, potassium normal, and positive influenza A testing- VBG ordered for further assessment. 1L NS being administered in triage as there are no beds available at this time.    Joseph Barr 08/27/21 0604    Joseph Baton, MD 08/27/21 [phone]

## 2021-08-27 NOTE — ED Notes (Signed)
RN gave pt ice

## 2021-08-27 NOTE — Progress Notes (Addendum)
Inpatient Diabetes Program Recommendations  AACE/ADA: New Consensus Statement on Inpatient Glycemic Control (2015)  Target Ranges:  Prepandial:   less than 140 mg/dL      Peak postprandial:   less than 180 mg/dL (1-2 hours)      Critically ill patients:  140 - 180 mg/dL   Lab Results  Component Value Date   GLUCAP 417 (H) 08/27/2021   HGBA1C 14.7 (H) 06/06/2021    Review of Glycemic Control  Latest Reference Range & Units 08/27/21 10:29 08/27/21 11:23 08/27/21 12:31  Glucose-Capillary 70 - 99 mg/dL >756 (HH) 433 (HH) 295 (H)  (HH): Data is critically high (H): Data is abnormally high  Latest Reference Range & Units 08/27/21 02:08  Anion gap 5 - 15  19 (H)  (H): Data is abnormally high   Latest Reference Range & Units 08/27/21 02:08  CO2 22 - 32 mmol/L 23    Diabetes history: DM1(does not make insulin.  Needs correction, basal and meal coverage) Outpatient Diabetes medications: Lantus 36 units QD (last had at 0200), Novolog 12-20 units TID and 1 unit/10 CHO's TID with, meals Current orders for Inpatient glycemic control: None  Inpatient Diabetes Program Recommendations:    Novolog 0-9 units Q4H Semglee 28 units QHS (80% of home dose)  Spoke with patient briefly.  He has been sick for 2 weeks.  Has been giving himself move insulin than normal.  He last had Lantus 36 units at 0200 prior to EMS getting him.    Our team spoke with him about A1C of >14% in October.   Will continue to follow while inpatient.  Thank you, Dulce Sellar, RN, BSN Diabetes Coordinator Inpatient Diabetes Program [phone] (team pager from 8a-5p)

## 2021-08-27 NOTE — Progress Notes (Signed)
Critical result relayed by Jonny Ruiz of laboratory, serum osmolality of 329, relayed to Intern Bonnano.

## 2021-08-27 NOTE — ED Notes (Signed)
MD paged about placing diet order.

## 2021-08-27 NOTE — ED Provider Notes (Signed)
[place] EMERGENCY DEPARTMENT Provider Note  CSN: [id number] Arrival date & time: 08/27/21 0142    History Chief Complaint  Patient presents with   Nausea   Emesis    Joseph Barr is a 25 y.o. male with history of DM reports 12 days of N/V, some days are better and then it gets worse again. He has had URI symptoms as well. He was noted to be hyperglycemic with a mild bump in Cr in triage, states his sugar had been well controlled at home. He was given IVF+Zofran enroute with EMS and additional IVF and Ativan (due to QT prolongation) in the waiting room but continued to feel worse. Repeat labs show worsening Glucose but no signs of acidosis on chemistry or ABG. He remains tachycardic.    Past Medical History:  Diagnosis Date   Diabetes mellitus     History reviewed. No pertinent surgical history.  Family History  Problem Relation Age of Onset   Cancer Maternal Grandfather    Hypertension Mother    Diabetes Neg Hx     Social History   Tobacco Use   Smoking status: Former    Types: E-cigarettes   Smokeless tobacco: Former  Scientific laboratory technician Use: Former  Substance Use Topics   Alcohol use: No   Drug use: Yes    Types: Marijuana     Home Medications Prior to Admission medications   Medication Sig Start Date End Date Taking? Authorizing Provider  Blood Glucose Monitoring Suppl (TRUE METRIX METER) w/Device KIT Use as directed 12/21/20   Camillia Herter, NP  glucose blood (ACCU-CHEK AVIVA) test strip 1 each by Other route 4 (four) times daily. And lancets 250.03 06/26/14   Renato Shin, MD  glucose blood (TRUE METRIX BLOOD GLUCOSE TEST) test strip Use as instructed 01/08/21   Camillia Herter, NP  insulin aspart (NOVOLOG FLEXPEN) 100 UNIT/ML FlexPen Inject 12-20 Units into the skin 3 (three) times daily with meals. Sliding scale 12/21/20   Camillia Herter, NP  insulin glargine (LANTUS SOLOSTAR) 100 UNIT/ML Solostar Pen Inject 36 Units into the skin daily. 07/05/21  10/13/21  Camillia Herter, NP  Insulin Pen Needle 31G X 5 MM MISC Use as directed in the morning, at noon, and at bedtime. 07/05/21   [name], NP  TRUEplus Lancets 28G MISC Use as directed 01/08/21   Camillia Herter, NP     Allergies    Patient has no known allergies.   Review of Systems   Review of Systems A comprehensive review of systems was completed and negative except as noted in HPI.    Physical Exam BP (!) 123/91 (BP Location: Right Arm)    Pulse (!) 131    Temp 97.8 F (36.6 C) (Oral)    Resp (!) 25    Ht 6' (1.829 m)    Wt 68 kg    SpO2 95%    BMI 20.34 kg/m   Physical Exam Vitals and nursing note reviewed.  Constitutional:      Appearance: Normal appearance.  HENT:     Head: Normocephalic and atraumatic.     Nose: Nose normal.     Mouth/Throat:     Mouth: Mucous membranes are dry.  Eyes:     Extraocular Movements: Extraocular movements intact.     Conjunctiva/sclera: Conjunctivae normal.  Cardiovascular:     Rate and Rhythm: Tachycardia present.  Pulmonary:     Effort: Pulmonary effort is normal.  Breath sounds: Normal breath sounds.  Abdominal:     General: Abdomen is flat.     Palpations: Abdomen is soft.     Tenderness: There is no abdominal tenderness.  Musculoskeletal:        General: No swelling. Normal range of motion.     Cervical back: Neck supple.  Skin:    General: Skin is warm and dry.  Neurological:     General: No focal deficit present.     Mental Status: He is alert.  Psychiatric:        Mood and Affect: Mood normal.     ED Results / Procedures / Treatments   Labs (all labs ordered are listed, but only abnormal results are displayed) Labs Reviewed  RESP PANEL BY RT-PCR (FLU A&B, COVID) ARPGX2 - Abnormal; Notable for the following components:      Result Value   Influenza A by PCR POSITIVE (*)    All other components within normal limits  COMPREHENSIVE METABOLIC PANEL - Abnormal; Notable for the following components:    Chloride 95 (*)    Glucose, Bld 396 (*)    BUN 36 (*)    Creatinine, Ser 1.76 (*)    Albumin 3.4 (*)    GFR, Estimated 54 (*)    Anion gap 19 (*)    All other components within normal limits  CBC WITH DIFFERENTIAL/PLATELET - Abnormal; Notable for the following components:   WBC 16.4 (*)    Neutro Abs 12.8 (*)    Monocytes Absolute 2.0 (*)    Abs Immature Granulocytes 0.10 (*)    All other components within normal limits  URINALYSIS, ROUTINE W REFLEX MICROSCOPIC - Abnormal; Notable for the following components:   Specific Gravity, Urine >1.030 (*)    Glucose, UA >=500 (*)    Hgb urine dipstick SMALL (*)    Bilirubin Urine MODERATE (*)    Ketones, ur 40 (*)    Protein, ur 100 (*)    All other components within normal limits  CBG MONITORING, ED - Abnormal; Notable for the following components:   Glucose-Capillary >600 (*)    All other components within normal limits  I-STAT VENOUS BLOOD GAS, ED - Abnormal; Notable for the following components:   pO2, Ven 46.0 (*)    Sodium 133 (*)    Calcium, Ion 1.07 (*)    All other components within normal limits  I-STAT CHEM 8, ED - Abnormal; Notable for the following components:   Sodium 134 (*)    Chloride 96 (*)    BUN 39 (*)    Creatinine, Ser 1.30 (*)    Glucose, Bld 563 (*)    Calcium, Ion 1.08 (*)    All other components within normal limits  CBG MONITORING, ED - Abnormal; Notable for the following components:   Glucose-Capillary 561 (*)    All other components within normal limits  CBG MONITORING, ED - Abnormal; Notable for the following components:   Glucose-Capillary 417 (*)    All other components within normal limits  LIPASE, BLOOD  URINALYSIS, MICROSCOPIC (REFLEX)  MAGNESIUM    EKG EKG Interpretation  Date/Time:  Tuesday August 27 2021 12:53:56 EST Ventricular Rate:  134 PR Interval:  94 QRS Duration: 85 QT Interval:  274 QTC Calculation: 409 R Axis:   85 Text Interpretation: Sinus tachycardia LAE, consider  biatrial enlargement Probable left ventricular hypertrophy Abnormal T, consider ischemia, diffuse leads Since last tracing similar rate but QT is shortened Confirmed by Calvert Cantor (  [id number]) on 08/27/2021 12:58:48 PM  Radiology DG Chest 2 View  Result Date: 08/27/2021 CLINICAL DATA:  Cough. EXAM: CHEST - 2 VIEW COMPARISON:  Chest radiograph dated 02/27/2021. FINDINGS: The heart size and mediastinal contours are within normal limits. Both lungs are clear. The visualized skeletal structures are unremarkable. IMPRESSION: No active cardiopulmonary disease. Electronically Signed   By: Anner Crete M.D.   On: 08/27/2021 03:25    Procedures Procedures  Medications Ordered in the ED Medications  LORazepam (ATIVAN) injection 0.5 mg (0.5 mg Intravenous Given 08/27/21 0605)  sodium chloride 0.9 % bolus 1,000 mL (0 mLs Intravenous Stopped 08/27/21 1022)  lactated ringers bolus 1,000 mL (0 mLs Intravenous Stopped 08/27/21 1153)  insulin aspart (novoLOG) injection 10 Units (10 Units Subcutaneous Given 08/27/21 1045)  lactated ringers bolus 1,000 mL (1,000 mLs Intravenous New Bag/Given 08/27/21 1244)  ondansetron (ZOFRAN) injection 4 mg (4 mg Intravenous Given 08/27/21 1319)     MDM Rules/Calculators/A&P MDM Labs from triage reviewed, worsening glucose, improving Creatinine. Still appears dry and tachycardic with glucose above 600. He was not acidotic on VBG earlier, will give additional IVF. SQ insulin and reassess. Flu is positive.   ED Course  I have reviewed the triage vital signs and the nursing notes.  Pertinent labs & imaging results that were available during my care of the patient were reviewed by me and considered in my medical decision making (see chart for details).  Clinical Course as of 08/27/21 1331  Tue Aug 27, 2021  1234 Glucose is improving but patient remains tachycardic with dry heaves. Will give additional IVF. Recheck EKG to measure QT again. Plan admission for further  evaluation.  [CS]  1330 Spoke with IM residents who will evaluate for admission.  [CS]    Clinical Course User Index [CS] Truddie Hidden, MD    Final Clinical Impression(s) / ED Diagnoses Final diagnoses:  Hyperglycemia  Dehydration  Influenza A  Tachycardia    Rx / DC Orders ED Discharge Orders     None        Truddie Hidden, MD 08/27/21 1331

## 2021-08-27 NOTE — Progress Notes (Signed)
MEWS still at 3, saturation at 90-91 on room air, hooked to oxygen at 2 LPM via nasal cannula. Charge nurse and MD informed.     08/27/21 2041  Assess: MEWS Score  Temp 98.8 F (37.1 C)  BP 125/78  Pulse Rate (!) 141  Resp 18  SpO2 91 %  O2 Device Room Air  Assess: MEWS Score  MEWS Temp 0  MEWS Systolic 0  MEWS Pulse 3  MEWS RR 0  MEWS LOC 0  MEWS Score 3  MEWS Score Color Yellow  Assess: if the MEWS score is Yellow or Red  Were vital signs taken at a resting state? Yes  Focused Assessment No change from prior assessment  Early Detection of Sepsis Score *See Row Information* Low  MEWS guidelines implemented *See Row Information* Yes  Treat  Pain Scale 0-10  Take Vital Signs  Increase Vital Sign Frequency  Yellow: Q 2hr X 2 then Q 4hr X 2, if remains yellow, continue Q 4hrs  Escalate  MEWS: Escalate Yellow: discuss with charge nurse/RN and consider discussing with provider and RRT  Notify: Charge Nurse/RN  Name of Charge Nurse/RN Notified Carla RN  Date Charge Nurse/RN Notified 08/27/21  Time Charge Nurse/RN Notified 2047  Notify: Provider  Provider Name/Title Dr Hanley Ben  Date Provider Notified 08/27/21  Time Provider Notified [phone]  Notification Type Page  Notification Reason Other (Comment) (hooked pt on oxygen, HR at 140)

## 2021-08-27 NOTE — ED Triage Notes (Signed)
Pt brought in by EMS for nausea and vomiting x 12 days. Pt also reports abdominal pain. Pt received 4 mg zofran and 500 ml of normal saline from EMS.

## 2021-08-28 ENCOUNTER — Inpatient Hospital Stay (HOSPITAL_COMMUNITY): Payer: Self-pay

## 2021-08-28 DIAGNOSIS — N179 Acute kidney failure, unspecified: Secondary | ICD-10-CM

## 2021-08-28 DIAGNOSIS — R6520 Severe sepsis without septic shock: Secondary | ICD-10-CM

## 2021-08-28 DIAGNOSIS — J189 Pneumonia, unspecified organism: Secondary | ICD-10-CM

## 2021-08-28 DIAGNOSIS — E1010 Type 1 diabetes mellitus with ketoacidosis without coma: Secondary | ICD-10-CM

## 2021-08-28 DIAGNOSIS — J101 Influenza due to other identified influenza virus with other respiratory manifestations: Secondary | ICD-10-CM | POA: Diagnosis present

## 2021-08-28 DIAGNOSIS — A419 Sepsis, unspecified organism: Principal | ICD-10-CM

## 2021-08-28 LAB — BASIC METABOLIC PANEL
Anion gap: 13 (ref 5–15)
Anion gap: 13 (ref 5–15)
Anion gap: 9 (ref 5–15)
Anion gap: 8 (ref 5–15)
Anion gap: 11 (ref 5–15)
Anion gap: 6 (ref 5–15)
BUN: 17 mg/dL (ref 6–20)
BUN: 13 mg/dL (ref 6–20)
BUN: 12 mg/dL (ref 6–20)
BUN: 11 mg/dL (ref 6–20)
BUN: 10 mg/dL (ref 6–20)
BUN: 9 mg/dL (ref 6–20)
CO2: 23 mmol/L (ref 22–32)
CO2: 28 mmol/L (ref 22–32)
CO2: 30 mmol/L (ref 22–32)
CO2: 29 mmol/L (ref 22–32)
CO2: 29 mmol/L (ref 22–32)
CO2: 29 mmol/L (ref 22–32)
Calcium: 8.7 mg/dL — ABNORMAL LOW (ref 8.9–10.3)
Calcium: 8.6 mg/dL — ABNORMAL LOW (ref 8.9–10.3)
Calcium: 8.6 mg/dL — ABNORMAL LOW (ref 8.9–10.3)
Calcium: 8.3 mg/dL — ABNORMAL LOW (ref 8.9–10.3)
Calcium: 8.4 mg/dL — ABNORMAL LOW (ref 8.9–10.3)
Calcium: 8.1 mg/dL — ABNORMAL LOW (ref 8.9–10.3)
Chloride: 107 mmol/L (ref 98–111)
Chloride: 109 mmol/L (ref 98–111)
Chloride: 104 mmol/L (ref 98–111)
Chloride: 106 mmol/L (ref 98–111)
Chloride: 105 mmol/L (ref 98–111)
Chloride: 107 mmol/L (ref 98–111)
Creatinine, Ser: 1.45 mg/dL — ABNORMAL HIGH (ref 0.61–1.24)
Creatinine, Ser: 1.23 mg/dL (ref 0.61–1.24)
Creatinine, Ser: 1.19 mg/dL (ref 0.61–1.24)
Creatinine, Ser: 1 mg/dL (ref 0.61–1.24)
Creatinine, Ser: 0.93 mg/dL (ref 0.61–1.24)
Creatinine, Ser: 0.82 mg/dL (ref 0.61–1.24)
GFR, Estimated: >60 mL/min (ref >60)
GFR, Estimated: >60 mL/min (ref >60)
GFR, Estimated: >60 mL/min (ref >60)
GFR, Estimated: >60 mL/min (ref >60)
GFR, Estimated: >60 mL/min (ref >60)
GFR, Estimated: >60 mL/min (ref >60)
Glucose, Bld: 210 mg/dL — ABNORMAL HIGH (ref 70–99)
Glucose, Bld: 138 mg/dL — ABNORMAL HIGH (ref 70–99)
Glucose, Bld: 164 mg/dL — ABNORMAL HIGH (ref 70–99)
Glucose, Bld: 263 mg/dL — ABNORMAL HIGH (ref 70–99)
Glucose, Bld: 160 mg/dL — ABNORMAL HIGH (ref 70–99)
Glucose, Bld: 118 mg/dL — ABNORMAL HIGH (ref 70–99)
Potassium: 3.7 mmol/L (ref 3.5–5.1)
Potassium: 3.6 mmol/L (ref 3.5–5.1)
Potassium: 3.6 mmol/L (ref 3.5–5.1)
Potassium: 3.6 mmol/L (ref 3.5–5.1)
Potassium: 3.1 mmol/L — ABNORMAL LOW (ref 3.5–5.1)
Potassium: 3.2 mmol/L — ABNORMAL LOW (ref 3.5–5.1)
Sodium: 143 mmol/L (ref 135–145)
Sodium: 150 mmol/L — ABNORMAL HIGH (ref 135–145)
Sodium: 143 mmol/L (ref 135–145)
Sodium: 143 mmol/L (ref 135–145)
Sodium: 145 mmol/L (ref 135–145)
Sodium: 142 mmol/L (ref 135–145)

## 2021-08-28 LAB — CBC
HCT: 33.3 % — ABNORMAL LOW (ref 39.0–52.0)
HCT: 32.9 % — ABNORMAL LOW (ref 39.0–52.0)
Hemoglobin: 11.4 g/dL — ABNORMAL LOW (ref 13.0–17.0)
Hemoglobin: 11.2 g/dL — ABNORMAL LOW (ref 13.0–17.0)
MCH: 29.3 pg (ref 26.0–34.0)
MCH: 29.3 pg (ref 26.0–34.0)
MCHC: 34.2 g/dL (ref 30.0–36.0)
MCHC: 34 g/dL (ref 30.0–36.0)
MCV: 85.6 fL (ref 80.0–100.0)
MCV: 86.1 fL (ref 80.0–100.0)
Platelets: 274 10*3/uL (ref 150–400)
Platelets: 256 10*3/uL (ref 150–400)
RBC: 3.89 MIL/uL — ABNORMAL LOW (ref 4.22–5.81)
RBC: 3.82 MIL/uL — ABNORMAL LOW (ref 4.22–5.81)
RDW: 13.4 % (ref 11.5–15.5)
RDW: 13.6 % (ref 11.5–15.5)
WBC: 20.1 10*3/uL — ABNORMAL HIGH (ref 4.0–10.5)
WBC: 20.5 10*3/uL — ABNORMAL HIGH (ref 4.0–10.5)
nRBC: 0 % (ref 0.0–0.2)
nRBC: 0 % (ref 0.0–0.2)

## 2021-08-28 LAB — DIFFERENTIAL
Abs Immature Granulocytes: 0 10*3/uL (ref 0.00–0.07)
Basophils Absolute: 0 10*3/uL (ref 0.0–0.1)
Basophils Relative: 0 %
Eosinophils Absolute: 0 10*3/uL (ref 0.0–0.5)
Eosinophils Relative: 0 %
Lymphocytes Relative: 10 %
Lymphs Abs: 2.1 10*3/uL (ref 0.7–4.0)
Monocytes Absolute: 1 10*3/uL (ref 0.1–1.0)
Monocytes Relative: 5 %
Neutro Abs: 17.4 10*3/uL — ABNORMAL HIGH (ref 1.7–7.7)
Neutrophils Relative %: 85 %
nRBC: 0 /100 WBC

## 2021-08-28 LAB — GLUCOSE, CAPILLARY
Glucose-Capillary: 158 mg/dL — ABNORMAL HIGH (ref 70–99)
Glucose-Capillary: 102 mg/dL — ABNORMAL HIGH (ref 70–99)
Glucose-Capillary: 167 mg/dL — ABNORMAL HIGH (ref 70–99)
Glucose-Capillary: 236 mg/dL — ABNORMAL HIGH (ref 70–99)
Glucose-Capillary: 136 mg/dL — ABNORMAL HIGH (ref 70–99)
Glucose-Capillary: 108 mg/dL — ABNORMAL HIGH (ref 70–99)

## 2021-08-28 LAB — BETA-HYDROXYBUTYRIC ACID
Beta-Hydroxybutyric Acid: 4.45 mmol/L — ABNORMAL HIGH (ref 0.05–0.27)
Beta-Hydroxybutyric Acid: 5.83 mmol/L — ABNORMAL HIGH (ref 0.05–0.27)

## 2021-08-28 MED ORDER — DEXTROSE 50 % IV SOLN
0.0000 mL | INTRAVENOUS | Status: DC | PRN
Start: 2021-08-28 — End: 2021-08-30

## 2021-08-28 MED ORDER — INSULIN GLARGINE-YFGN 100 UNIT/ML ~~LOC~~ SOLN
34.0000 [IU] | Freq: Every day | SUBCUTANEOUS | Status: DC
  Administered 2021-08-28 – 2021-08-30 (×3): 34 [IU] via SUBCUTANEOUS
  Filled 2021-08-28 (×3): qty 0.34

## 2021-08-28 MED ORDER — SODIUM CHLORIDE 0.9 % IV SOLN
2.0000 g | INTRAVENOUS | Status: DC
  Administered 2021-08-28 – 2021-08-29 (×2): 2 g via INTRAVENOUS
  Filled 2021-08-28 (×3): qty 20

## 2021-08-28 MED ORDER — POTASSIUM CHLORIDE CRYS ER 20 MEQ PO TBCR: 40.0000 meq | EXTENDED_RELEASE_TABLET | Freq: Three times a day (TID) | ORAL | Status: DC

## 2021-08-28 MED ORDER — VANCOMYCIN HCL IN DEXTROSE 1-5 GM/200ML-% IV SOLN
1000.0000 mg | Freq: Two times a day (BID) | INTRAVENOUS | Status: DC
  Administered 2021-08-28 – 2021-08-29 (×2): 1000 mg via INTRAVENOUS
  Filled 2021-08-28 (×3): qty 200

## 2021-08-28 MED ORDER — OSELTAMIVIR PHOSPHATE 75 MG PO CAPS
75.0000 mg | ORAL_CAPSULE | Freq: Every day | ORAL | Status: DC
  Filled 2021-08-28: qty 1

## 2021-08-28 MED ORDER — IOHEXOL 350 MG/ML SOLN
75.0000 mL | Freq: Once | INTRAVENOUS | Status: AC
  Administered 2021-08-28: 10:00:00 75 mL via INTRAVENOUS

## 2021-08-28 MED ORDER — VANCOMYCIN HCL 1500 MG/300ML IV SOLN
1500.0000 mg | Freq: Once | INTRAVENOUS | Status: AC
  Administered 2021-08-28: 12:00:00 1500 mg via INTRAVENOUS
  Filled 2021-08-28: qty 300

## 2021-08-28 MED ORDER — INSULIN ASPART 100 UNIT/ML IJ SOLN
0.0000 [IU] | Freq: Three times a day (TID) | INTRAMUSCULAR | Status: DC
  Administered 2021-08-28: 17:00:00 2 [IU] via SUBCUTANEOUS
  Administered 2021-08-29: 18:00:00 3 [IU] via SUBCUTANEOUS
  Administered 2021-08-30: 13:00:00 2 [IU] via SUBCUTANEOUS
  Administered 2021-08-30: 09:00:00 8 [IU] via SUBCUTANEOUS

## 2021-08-28 MED ORDER — DEXTROSE-NACL 5-0.9 % IV SOLN
INTRAVENOUS | Status: DC
Start: 2021-08-28 — End: 2021-08-29

## 2021-08-28 MED ORDER — INSULIN REGULAR(HUMAN) IN NACL 100-0.9 UT/100ML-% IV SOLN
INTRAVENOUS | Status: DC
  Administered 2021-08-28: 14:00:00 3 [IU]/h via INTRAVENOUS
  Filled 2021-08-28 (×2): qty 100

## 2021-08-28 MED ORDER — OSELTAMIVIR PHOSPHATE 75 MG PO CAPS
75.0000 mg | ORAL_CAPSULE | Freq: Two times a day (BID) | ORAL | Status: DC
  Filled 2021-08-28: qty 1

## 2021-08-28 MED ORDER — ENOXAPARIN SODIUM 40 MG/0.4ML IJ SOSY
40.0000 mg | PREFILLED_SYRINGE | Freq: Every day | INTRAMUSCULAR | Status: DC
  Administered 2021-08-28 – 2021-08-30 (×3): 40 mg via SUBCUTANEOUS
  Filled 2021-08-28 (×3): qty 0.4

## 2021-08-28 NOTE — Progress Notes (Signed)
Patient brought to 4E from 6N. VSS. CHG bath completed. Patient oriented to room and staff. Call bell in reach.  Kenard Gower, RN

## 2021-08-28 NOTE — Progress Notes (Signed)
Inpatient Diabetes Program Recommendations  AACE/ADA: New Consensus Statement on Inpatient Glycemic Control (2015)  Target Ranges:  Prepandial:   less than 140 mg/dL      Peak postprandial:   less than 180 mg/dL (1-2 hours)      Critically ill patients:  140 - 180 mg/dL   Lab Results  Component Value Date   GLUCAP 102 (H) 08/28/2021   HGBA1C 14.4 (H) 08/27/2021    Review of Glycemic Control  Latest Reference Range & Units 08/27/21 16:08 08/27/21 20:10 08/27/21 23:44 08/28/21 04:59 08/28/21 08:01  Glucose-Capillary 70 - 99 mg/dL 299 (H) Novolog 15 units 207 (H) Novolog 5 units 206 (H) Novolog 5 units Semglee 30 units 158 (H) Novolog 3 units 102 (H)  (H): Data is abnormally high  Latest Reference Range & Units 08/28/21 06:01  Sodium 135 - 145 mmol/L 150 (H)  Potassium 3.5 - 5.1 mmol/L 3.6  Chloride 98 - 111 mmol/L 109  CO2 22 - 32 mmol/L 28  Glucose 70 - 99 mg/dL 371 (H)  BUN 6 - 20 mg/dL 13  Creatinine 6.96 - 7.89 mg/dL 3.81  Calcium 8.9 - 01.7 mg/dL 8.6 (L)  Anion gap 5 - 15  13  (H): Data is abnormally high (L): Data is abnormally low Diabetes history: DM1 Outpatient Diabetes medications: Lantus 36 units QD (last had at 0200), Novolog 12-20 units TID and 1 unit/10 CHO's TID with, meals Current orders for Inpatient glycemic control: IV insulin  Inpatient Diabetes Program Recommendations:   Patient is not in DKA. Fasting CBG 102. Please consider: -Semglee 30 units qd -Add Novolog 4 units tid meal coverage when starting to eat -Novolog 0-9 units q 4 hrs. While NPO, then tid with meals + 0-5 units hs Paged 1st Contact: Gwenevere Abbot, MD: AD [phone]  for update  Thank you, Joseph Barr. Othel Hoogendoorn, RN, MSN, CDE  Diabetes Coordinator Inpatient Glycemic Control Team Team Pager [phone] (8am-5pm) 08/28/2021 10:07 AM

## 2021-08-28 NOTE — TOC Transition Note (Signed)
Transition of Care [facility]) - CM/SW Discharge Note   Patient Details  Name: Joseph Barr MRN: [id number] Date of Birth: 05-Sep-1995  Transition of Care [facility]) CM/SW Contact:  Kingsley Plan, RN Phone Number: 08/28/2021, 8:23 AM   Clinical Narrative:      Transition of Care [facility]) Screening Note   Patient Details  Name: Joseph Barr Date of Birth: 10-25-1995   Transition of Care [facility]) CM/SW Contact:    Kingsley Plan, RN Phone Number: 08/28/2021, 8:23 AM    Transition of Care Department [facility]) has reviewed patient and no TOC needs have been identified at this time. We will continue to monitor patient advancement through interdisciplinary progression rounds. If new patient transition needs arise, please place a TOC consult.        Patient active with [facility], will schedule follow up.   Consult for medication assistance. Patient can use pharmacy at [facility]. NCM will change pharmacy to [facility] for discharge prescriptions.   Patient Goals and CMS Choice        Discharge Placement                       Discharge Plan and Services                                     Social Determinants of Health (SDOH) Interventions     Readmission Risk Interventions Readmission Risk Prevention Plan 11/21/2019  Post Dischage Appt Not Complete  Appt Comments continue to work to re-establish pt at [facility] and Wellness vs alternate health clinic  Medication Screening Complete  Transportation Screening Complete  Some recent data might be hidden

## 2021-08-28 NOTE — Progress Notes (Signed)
Pharmacy Antibiotic Note  Joseph Barr is a 25 y.o. male admitted on 08/27/2021 with influenza A and secondary bacterial pneumonia. Pharmacy has been consulted for vancomycin dosing, ceftriaxone ordered per MD. AKI on admission is improving.  Plan: Vancomycin 1500mg  IV x1 then 1000mg  IV q12h - est AUC 467 Ceftriaxone per MD   Height: 6' (182.9 cm) Weight: 68 kg (150 lb) IBW/kg (Calculated) : 77.6  Temp (24hrs), Avg:99 F (37.2 C), Min:97.5 F (36.4 C), Max:100.6 F (38.1 C)  Recent Labs  Lab 08/27/21 0208 08/27/21 0627 08/27/21 1630 08/27/21 2024 08/28/21 0045 08/28/21 0601  WBC 16.4*  --   --   --   --  20.1*  CREATININE 1.76* 1.30* 1.93* 1.58* 1.45* 1.23    Estimated Creatinine Clearance: 87.5 mL/min (by C-G formula based on SCr of 1.23 mg/dL).    No Known Allergies  Antimicrobials this admission: Vancomycin 12/28 >>  Ceftriaxone 12/28 >>   Dose adjustments this admission: none  Microbiology results: pending  Thank you for allowing pharmacy to be a part of this patients care.   1/29, PharmD, BCPS, [facility] Clinical Pharmacist [phone] Please check AMION for all [facility] numbers 08/28/2021

## 2021-08-28 NOTE — Plan of Care (Signed)

## 2021-08-28 NOTE — Progress Notes (Addendum)
Joseph Barr is a 25 y.o. with PMH of T1DM admit for DKA secondary to flu like illness with suspected superimposed bacterial infection.    Subjective:  Overnight:NAEON  Patient stated he was did not see an improvement in his symptoms. He was complaining of fatigue. He denied any chest pain, or shortness of breath. He did complain of abdominal pain but no diarrhea or constipation.   Interval Hx: When checked on patient in the afternoon, he stated he was improved from the morning with less fatigue and no more abdominal pain. Denied any complaints at this time and stated he wanted something to drink.   Objective:  Vital signs in last 24 hours: Vitals:   08/28/21 0231 08/28/21 0723 08/28/21 1035 08/28/21 1103  BP: 118/72 (!) 107/59 116/75 109/70  Pulse: (!) 129 (!) 128 (!) 109 (!) 111  Resp: 18 20 18 20   Temp: 99 F (37.2 C) (!) 100.6 F (38.1 C) 99.4 F (37.4 C) 98.8 F (37.1 C)  TempSrc: Oral Oral Oral Oral  SpO2: 92% 94% 98% 95%  Weight:      Height:       Supplemental O2: Nasal Cannula SpO2: 95 % O2 Flow Rate (L/min): 2 L/min Physical Exam General: NAD Head: Normocephalic without scalp lesions.  Eyes: Conjunctivae pink, sclerae white, without jaundice.  Mouth and Throat: Lips normal color, without lesions. Dry mucus membrane. Neck: Neck supple with full range of motion (ROM). No masses or tenderness.  Lungs: CTAB, no wheeze, rhonchi or rales.  Cardiovascular: Normal heart sounds, no r/m/g, 2+ pulses in all extremities Abdomen: No TTP, normal bowel sounds MSK: No asymmetry or muscle atrophy. Full range of motion (ROM) of all joints.  Skin: warm, dry  Neuro: Alert and oriented. CN grossly intact Psych: Normal mood and normal affect  Filed Weights   08/27/21 0151  Weight: 68 kg     Intake/Output Summary (Last 24 hours) at 08/28/2021 1150 Last data filed at 08/28/2021 1000 Gross per 24 hour  Intake 5671.35 ml  Output 1277 ml  Net 4394.35 ml   Net IO Since  Admission: 5,393.35 mL [08/28/21 1150]  Assessment/Plan: Joseph Barr is a 25 y.o. with PMH of T1DM admit for DKA secondary to flu like illness with suspected superimposed bacterial infection.    Principal Problem:   Intractable nausea and vomiting Active Problems:   Influenza A  Sepsis 2/2 to RLL Pneumonia  Acute Hypoxic Respiratory Failure Influenza A infection  Likely aspiration in the setting of N/V and DKA, however secondary bacterial infection following recent influenza infection is also possible. Transferred to progressive today given worsening sepsis. Tachycardia has improved since transitioning to endo tool and D5 1/2 NS infusion.  - Continue telemetry  - IV Vanc and ceftriaxone  - F/u MRSA nasal swab, can d/c vanc if negative  - F/u blood clultures  DKA, resolving T1DM Patient presented with hyperglycemia and BHB was 5.8. His A1c was 14.4. His symptoms and lab work was consistent with DKA. He endorsed compliance to his insulin regimen however with an A1c of 14.4, suspect non compliance. DKA has resolved, however he was transitioned to endotool today given persistent abdominal pain, N/V, inability to tolerate PO, and need for IV free water replacement for hypernatremia with D5 1/2 normal saline. Beta hydroxybutyric acid is persistently elevated today and he is at risk for his anion gap reopening given his sepsis and poor intake.  -D5-NS 100 ml/hr -Start Endtool for now -Zofran prn -Transition of  insulin gtt once able to tolerate a diet   Hypernatremia Sodium up to 150 this morning after aggressive isotonic fluids yesterday and a free water deficit. He is unable to tolerate PO. Started on D5 1/2 NS infusion with endotool above, sodium has since improved.  - Monitor BMPs  Acute kidney Injury, prerenal  Resolved with IVFs, creatinine 1.93 to 1.19.  Diet: NPO; ADAT IVF: D5-NS 158ml/hr VTE: Lovenox Code: Full Prior to Admission Living Arrangement: Home Anticipated  Discharge Location: Home Barriers to Discharge: Medical Workup  Dispo: Anticipated discharge in approximately 1-2 day(s).   Gwenevere Abbot, MD Eligha Bridegroom. [facility] Internal Medicine Residency, PGY-1  Pager: [phone] After 5 pm and on weekends: Please call the on-call pager

## 2021-08-29 LAB — BASIC METABOLIC PANEL
Anion gap: 6 (ref 5–15)
Anion gap: 6 (ref 5–15)
Anion gap: 7 (ref 5–15)
BUN: 7 mg/dL (ref 6–20)
BUN: 7 mg/dL (ref 6–20)
BUN: <5 mg/dL — ABNORMAL LOW (ref 6–20)
CO2: 28 mmol/L (ref 22–32)
CO2: 28 mmol/L (ref 22–32)
CO2: 25 mmol/L (ref 22–32)
Calcium: 8.1 mg/dL — ABNORMAL LOW (ref 8.9–10.3)
Calcium: 7.7 mg/dL — ABNORMAL LOW (ref 8.9–10.3)
Calcium: 7.5 mg/dL — ABNORMAL LOW (ref 8.9–10.3)
Chloride: 109 mmol/L (ref 98–111)
Chloride: 104 mmol/L (ref 98–111)
Chloride: 104 mmol/L (ref 98–111)
Creatinine, Ser: 0.88 mg/dL (ref 0.61–1.24)
Creatinine, Ser: 0.91 mg/dL (ref 0.61–1.24)
Creatinine, Ser: 0.91 mg/dL (ref 0.61–1.24)
GFR, Estimated: >60 mL/min (ref >60)
GFR, Estimated: >60 mL/min (ref >60)
GFR, Estimated: >60 mL/min (ref >60)
Glucose, Bld: 103 mg/dL — ABNORMAL HIGH (ref 70–99)
Glucose, Bld: 87 mg/dL (ref 70–99)
Glucose, Bld: 190 mg/dL — ABNORMAL HIGH (ref 70–99)
Potassium: 3.3 mmol/L — ABNORMAL LOW (ref 3.5–5.1)
Potassium: 2.9 mmol/L — ABNORMAL LOW (ref 3.5–5.1)
Potassium: 3.2 mmol/L — ABNORMAL LOW (ref 3.5–5.1)
Sodium: 143 mmol/L (ref 135–145)
Sodium: 138 mmol/L (ref 135–145)
Sodium: 136 mmol/L (ref 135–145)

## 2021-08-29 LAB — GLUCOSE, CAPILLARY
Glucose-Capillary: 115 mg/dL — ABNORMAL HIGH (ref 70–99)
Glucose-Capillary: 153 mg/dL — ABNORMAL HIGH (ref 70–99)
Glucose-Capillary: 184 mg/dL — ABNORMAL HIGH (ref 70–99)
Glucose-Capillary: 86 mg/dL (ref 70–99)
Glucose-Capillary: 87 mg/dL (ref 70–99)
Glucose-Capillary: 95 mg/dL (ref 70–99)

## 2021-08-29 LAB — CBC
HCT: 31.1 % — ABNORMAL LOW (ref 39.0–52.0)
Hemoglobin: 10.2 g/dL — ABNORMAL LOW (ref 13.0–17.0)
MCH: 28.7 pg (ref 26.0–34.0)
MCHC: 32.8 g/dL (ref 30.0–36.0)
MCV: 87.4 fL (ref 80.0–100.0)
Platelets: 231 10*3/uL (ref 150–400)
RBC: 3.56 MIL/uL — ABNORMAL LOW (ref 4.22–5.81)
RDW: 13.5 % (ref 11.5–15.5)
WBC: 14.2 10*3/uL — ABNORMAL HIGH (ref 4.0–10.5)
nRBC: 0 % (ref 0.0–0.2)

## 2021-08-29 LAB — MRSA NEXT GEN BY PCR, NASAL: MRSA by PCR Next Gen: NOT DETECTED

## 2021-08-29 MED ORDER — MENTHOL 3 MG MT LOZG
1.0000 | LOZENGE | OROMUCOSAL | Status: DC | PRN
  Filled 2021-08-29: qty 9

## 2021-08-29 MED ORDER — PANTOPRAZOLE SODIUM 40 MG PO TBEC
40.0000 mg | DELAYED_RELEASE_TABLET | Freq: Every day | ORAL | Status: DC
  Administered 2021-08-29 – 2021-08-30 (×2): 40 mg via ORAL
  Filled 2021-08-29 (×2): qty 1

## 2021-08-29 MED ORDER — POTASSIUM CHLORIDE CRYS ER 20 MEQ PO TBCR
40.0000 meq | EXTENDED_RELEASE_TABLET | Freq: Two times a day (BID) | ORAL | Status: DC
  Administered 2021-08-29 – 2021-08-30 (×2): 40 meq via ORAL
  Filled 2021-08-29 (×2): qty 2

## 2021-08-29 MED ORDER — LACTATED RINGERS IV SOLN: INTRAVENOUS | Status: DC

## 2021-08-29 MED ORDER — POTASSIUM CHLORIDE 10 MEQ/100ML IV SOLN
10.0000 meq | INTRAVENOUS | Status: AC
  Administered 2021-08-29 – 2021-08-30 (×4): 10 meq via INTRAVENOUS
  Filled 2021-08-29 (×4): qty 100

## 2021-08-29 NOTE — Hospital Course (Addendum)
Sepsis 2/2 to RLL Pneumonia  Acute Hypoxic Respiratory Failure Influenza A infection  Likely aspiration in the setting of N/V and DKA, however secondary bacterial infection following recent influenza infection is also possible. With his tachycardia and oxygen requirement, PE was considered and CTA was negative. Tachycardia improved with fluids and after abx administration. His oxygen demand has resolved and he started satting well on RA. Leukocytosis improved. Ceftriaxone 2 gm qd was continued and since MRSA swab was negative, the vanc was discontinued. NGTD on blood cultures. Ceftriaxone 2 grams was given for 2 days and discharged with 3 days of Augmentin.    DKA, resolving T1DM Esophagitis Patient presented with hyperglycemia and BHB was 5.8. His A1c was 14.4. His symptoms and lab work was consistent with DKA. He endorsed compliance to his insulin regimen however with an A1c of 14.4, suspect non compliance. DKA resolved. He has been transitioned off endo-tool to subq insulin. Dextrose drip was discontinued once patient was tolerating po. Esophagitis was noted on imaging which appears 2/2 to his prolonged vomiting. For the esophagitis he was first on IV pepcid then transitioned protonix 40 mg po. He is denying any symptoms of dysphagia or heartburn but we will discharge him 4 weeks of PPI to allow for healing of the esophagitis.      Hypokalemia Patient developed hypokalemia which may be secondary to poor intake and insulin ggt. Goal was to replete as needed until patient able to tolerate good PO. Total potassium of 200 mEq was given during hospitalization. Last BMP showed potassium of 3.4 but he received supplementation afterwards. He has good po intake. Please repeat BMP during hospital follow up.   Hypernatremia  Sodium up to 150 yesterday after aggressive isotonic fluids yesterday and a free water deficit. He was started on D5 1/2 NS infusion with endotool yesterday, sodium has since improved  since then. The hypernatremia resolved.

## 2021-08-29 NOTE — Progress Notes (Addendum)
Joseph Barr is a 25 y.o. with PMH of T1DM admit for sepsis and DKA secondary to a RLL pneumonia.   Subjective:  Overnight:NAEON  Patient states he feels well. His only complaint is cough. He states he is having productive cough. When asked to describe the mucus, he states its ranging in color from yellow to green to brown. Overall, he believes he is doing well.   Objective:  Vital signs in last 24 hours: Vitals:   08/28/21 1631 08/28/21 2028 08/29/21 0028 08/29/21 0411  BP: 112/77 99/60 104/69 110/72  Pulse: (!) 107 (!) 110 96 100  Resp: 17 14 17 14   Temp: 98.8 F (37.1 C) 99.9 F (37.7 C) 98.9 F (37.2 C) 99.1 F (37.3 C)  TempSrc: Oral Oral Oral Oral  SpO2: 96% 97% 94% 94%  Weight:      Height:       SpO2 97% on RA  Physical Exam General: Well-developed, NAD Head: Normocephalic without scalp lesions.  Eyes: PERRLA, Conjunctivae pink, sclerae white, without icterus.  Mouth and Throat: Lips normal color, without lesions. Moist mucus membrane. Neck: Neck supple with full range of motion (ROM). Lungs: Diffuse rhonchi present, no wheeze, rales.  Cardiovascular: sinus tachycardia, no r/m/g, 2+ pulses in all extremities. No LE edema Abdomen: No TTP, normal bowel sounds MSK: No asymmetry or muscle atrophy. Full range of motion (ROM) of all joints. No injuries noted.  Skin: warm, dry good skin turgor, no lesions noted Neuro: Alert and oriented. CN grossly intact Psych: Normal mood and normal affect.  Filed Weights   08/27/21 0151  Weight: 68 kg     Intake/Output Summary (Last 24 hours) at 08/29/2021 0600 Last data filed at 08/29/2021 08/31/2021 Gross per 24 hour  Intake 1887.93 ml  Output 350 ml  Net 1537.93 ml   Net IO Since Admission: 7,281.28 mL [08/29/21 0600]  Assessment/Plan: Joseph Barr is a 25 y.o. with PMH of T1DM admit for DKA secondary to flu like illness with suspected superimposed bacterial infection.    Principal Problem:   Pneumonia of right  lower lobe due to infectious organism Active Problems:   Diabetic ketoacidosis without coma associated with type 1 diabetes mellitus (HCC)   Intractable nausea and vomiting   Influenza A   Sepsis with acute renal failure without septic shock (HCC)  Sepsis 2/2 to RLL Pneumonia  Acute Hypoxic Respiratory Failure Influenza A infection  Likely aspiration in the setting of N/V and DKA, however secondary bacterial infection following recent influenza infection is also possible. Tachycardia improved since yesterday with max heart rate around 110 since midnight compared to HR in 140s yesterday. His oxygen demand has resolved and he is satting well on RA. Leukocytosis improving.  - Continue telemetry  - IV Vanc 1 gm q12hrs and ceftriaxone 2 gm qd - F/u MRSA nasal swab, can d/c vanc if negative  - F/u blood cultures NGTD  DKA, resolving T1DM Esophagitis Patient presented with hyperglycemia and BHB was 5.8. His A1c was 14.4. His symptoms and lab work was consistent with DKA. He endorsed compliance to his insulin regimen however with an A1c of 14.4, suspect non compliance. DKA has resolved. He has been transitioned off endo-tool to subq insulin. We will dc the dextrose once patient is tolerating consistent PO intake. Esophagitis was noted on imaging which appears 2/2 to his prolonged vomiting.  -D5-NS 100 ml/hr -Semglee 34 units, novolog 0-15 units tid with meals -Encourage PO intake as tolerated -Protonix 40 mg qd  Hypokalemia Patient developed hypokalemia which may be secondary to poor intake and insulin ggt. Goal is to replete as needed until patient able to tolerate good PO.  -Potassium repletion 40 mEq BID as needed with goal of 4. -BMP later this afternoon.    Hypernatremia  Resolved. Sodium up to 150 yesterday after aggressive isotonic fluids yesterday and a free water deficit. He was started on D5 1/2 NS infusion with endotool above yesterday, sodium has since improved since then. 143  this morning. - D5-NS 100 ml/hr - Monitor BMPs   Diet: Carbs modified IVF: D5-NS 114ml/hr VTE: Lovenox Code: Full Prior to Admission Living Arrangement: Home Anticipated Discharge Location: Home Barriers to Discharge: Medical Workup  Dispo: Anticipated discharge in approximately 1-2 day(s).   Joseph Abbot, MD Eligha Bridegroom. [facility] Internal Medicine Residency, PGY-1  Pager: [phone] After 5 pm and on weekends: Please call the on-call pager

## 2021-08-29 NOTE — Plan of Care (Signed)
  Problem: Education: Goal: Knowledge of General Education information will improve Description Including pain rating scale, medication(s)/side effects and non-pharmacologic comfort measures Outcome: Progressing   

## 2021-08-30 ENCOUNTER — Other Ambulatory Visit (HOSPITAL_COMMUNITY): Payer: Self-pay

## 2021-08-30 LAB — GLUCOSE, CAPILLARY
Glucose-Capillary: 83 mg/dL (ref 70–99)
Glucose-Capillary: 43 mg/dL — CL (ref 70–99)
Glucose-Capillary: 91 mg/dL (ref 70–99)
Glucose-Capillary: 274 mg/dL — ABNORMAL HIGH (ref 70–99)
Glucose-Capillary: 127 mg/dL — ABNORMAL HIGH (ref 70–99)

## 2021-08-30 LAB — BASIC METABOLIC PANEL
Anion gap: 5 (ref 5–15)
BUN: <5 mg/dL — ABNORMAL LOW (ref 6–20)
CO2: 26 mmol/L (ref 22–32)
Calcium: 7.6 mg/dL — ABNORMAL LOW (ref 8.9–10.3)
Chloride: 107 mmol/L (ref 98–111)
Creatinine, Ser: 0.84 mg/dL (ref 0.61–1.24)
GFR, Estimated: >60 mL/min (ref >60)
Glucose, Bld: 129 mg/dL — ABNORMAL HIGH (ref 70–99)
Potassium: 3.4 mmol/L — ABNORMAL LOW (ref 3.5–5.1)
Sodium: 138 mmol/L (ref 135–145)

## 2021-08-30 MED ORDER — PANTOPRAZOLE SODIUM 40 MG PO TBEC
40.0000 mg | DELAYED_RELEASE_TABLET | Freq: Every day | ORAL | 0 refills | Status: DC
  Filled 2021-08-30: qty 30, 30d supply, fill #0

## 2021-08-30 MED ORDER — ACETAMINOPHEN 325 MG PO TABS: 650.0000 mg | ORAL_TABLET | Freq: Four times a day (QID) | ORAL | Status: DC | PRN

## 2021-08-30 MED ORDER — GUAIFENESIN-DM 100-10 MG/5ML PO SYRP
10.0000 mL | ORAL_SOLUTION | ORAL | 0 refills | Status: DC | PRN
  Filled 2021-08-30: qty 118, 2d supply, fill #0

## 2021-08-30 MED ORDER — MENTHOL 3 MG MT LOZG
1.0000 | LOZENGE | OROMUCOSAL | 12 refills | Status: DC | PRN
  Filled 2021-08-30: qty 100, 17d supply, fill #0

## 2021-08-30 MED ORDER — AMOXICILLIN-POT CLAVULANATE 875-125 MG PO TABS
1.0000 | ORAL_TABLET | Freq: Two times a day (BID) | ORAL | 0 refills | Status: AC
  Filled 2021-08-30: qty 6, 3d supply, fill #0

## 2021-08-30 NOTE — Progress Notes (Signed)
Inpatient Diabetes Program Recommendations  AACE/ADA: New Consensus Statement on Inpatient Glycemic Control (2015)  Target Ranges:  Prepandial:   less than 140 mg/dL      Peak postprandial:   less than 180 mg/dL (1-2 hours)      Critically ill patients:  140 - 180 mg/dL   Lab Results  Component Value Date   GLUCAP 274 (H) 08/30/2021   HGBA1C 14.4 (H) 08/27/2021    Review of Glycemic Control  Latest Reference Range & Units 08/30/21 03:10 08/30/21 05:24 08/30/21 05:47 08/30/21 08:29  Glucose-Capillary 70 - 99 mg/dL 83 43 (LL) 91 431 (H)  (LL): Data is critically low (H): Data is abnormally high Diabetes history: Type 1 DM (requires basal/bolus with meals) Outpatient Diabetes medications: Novolog 1-10 CHO ratio, Lantus 34 units QD Current orders for Inpatient glycemic control: Novolog 0-15 units TID, Semglee 34 units QD  Inpatient Diabetes Program Recommendations:    Noted hypoglycemia of 43 mg/dL. In [facility] and reviewing chart, Novolog correction was given 2 hours after the previous CBG was taken. To reduce risk of hypoglycemia, SSI MUST be given within an hour of previous CBG.   At this time, consider adding Novolog 2 units TID (assuming patient is consuming >50% of meals) and reducing correction to Novolog 0-9 units TID.  Spoke with patient patient regarding outpatient diabetes management. Patient verified his home dose, however is frustrated that Novolog "doesn't work for me". It has been over ten years since patient was seen by endocrinology and reports CBGs are high post prandially.  Reviewed patient's current A1c of 14.4%. Explained what a A1c is and what it measures. Also reviewed goal A1c with patient, importance of good glucose control @ home, and blood sugar goals. Reviewed patho of DM, need for insulin, need for improved control, CHO ratios, need for PCP, impact of infection with poor glycemic control, vascular changes and commorbidities.  Patient reports checking CBGs multiple  times per day and reports elevations only after meals. We discussed CHO ratio, meal coverage, counting carbs and the need for potential CHO ratio adjustment. Encouraged follow up with PCP. TOC consult placed.  Patient has no further questions.   Thanks, Lujean Rave, MSN, RNC-OB Diabetes Coordinator [phone] (8a-5p)    Thanks, Lujean Rave, MSN, RNC-OB Diabetes Coordinator [phone] (8a-5p)

## 2021-08-30 NOTE — Discharge Instructions (Addendum)
You presented to the hospital with nausea, vomiting and abdominal pain and were found to have elevated sugars. You stated your brother had flu and you had similar symptoms for 12 days. You tested positive for the flu. You had another episode of DKA which can occur during and infection and if you miss your insulin. We treated you with insulin and supportive care until you were able to eat and drink well. Please use your insulin regularly and follow up with your PCP regarding this as you A1c was 14.4 which indicates your diabetes is not controlled. Your PCP might adjust your insulin to get better control of your diabetes to help you live a long and healthy live.   On imaging, we noticed a pneumonia which is a lung infection. We gave you antibiotics in the hospital and are discharging you with antibiotics. Please complete these. We are giving you a medication for cough and lozenges to help with the sore throat. The sore throat is coming for repeated coughing and irritation of your throat. Continue taking protonix which is to protect your esophagus as you had some irritation of that due to the vomiting.   Follow up with your primary care doctor in 10 days to ensure you are doing better and to the PCP needs to make any adjustments to your medications.

## 2021-08-30 NOTE — Discharge Summary (Addendum)
Name: Joseph Barr MRN: [id number] DOB: 1996-08-26 25 y.o. PCP: [name], NP  Date of Admission: 08/27/2021  1:42 AM Date of Discharge: 08/30/21 Attending Physician: Sid Falcon, MD  Discharge Diagnosis: Principal Problem:   Pneumonia of right lower lobe due to infectious organism Active Problems:   Diabetic ketoacidosis without coma associated with type 1 diabetes mellitus (HCC)   Intractable nausea and vomiting   Influenza A   Sepsis with acute renal failure without septic shock [facility])   Discharge Medications: Allergies as of 08/30/2021   No Known Allergies      Medication List     STOP taking these medications    DAYQUIL/NYQUIL COLD/FLU RELIEF PO   NYQUIL MULTI-SYMPTOM PO       TAKE these medications    acetaminophen 325 MG tablet Commonly known as: TYLENOL Take 2 tablets (650 mg total) by mouth every 6 (six) hours as needed for mild pain (or Fever >/= 101).   amoxicillin-clavulanate 875-125 MG tablet Commonly known as: Augmentin Take 1 tablet by mouth 2 (two) times daily for 3 days.   B-D UF III MINI PEN NEEDLES 31G X 5 MM Misc Generic drug: Insulin Pen Needle Use as directed in the morning, at noon, and at bedtime.   glucose blood test strip Commonly known as: Accu-Chek Aviva 1 each by Other route 4 (four) times daily. And lancets 250.03   True Metrix Blood Glucose Test test strip Generic drug: glucose blood Use as instructed   guaiFENesin-dextromethorphan 100-10 MG/5ML syrup Commonly known as: ROBITUSSIN DM Take 10 mLs by mouth every 4 (four) hours as needed for cough.   Lantus SoloStar 100 UNIT/ML Solostar Pen Generic drug: insulin glargine Inject 36 Units into the skin daily. What changed: how much to take   menthol-cetylpyridinium 3 MG lozenge Commonly known as: CEPACOL Take 1 lozenge (3 mg total) by mouth every 4 (four) hours as needed for sore throat.   NovoLOG FlexPen 100 UNIT/ML FlexPen Generic drug: insulin  aspart Inject 12-20 Units into the skin 3 (three) times daily with meals. Sliding scale What changed: additional instructions   pantoprazole 40 MG tablet Commonly known as: PROTONIX Take 1 tablet (40 mg total) by mouth daily. Start taking on: August 31, 2021   True Metrix Meter w/Device Kit Use as directed   TRUEplus Lancets 28G Misc Use as directed      Disposition and follow-up:   [name] was discharged from [facility] in Stable condition.  At the hospital follow up visit please address:  Bacterial vs Viral Pneumonia in setting of flu: please follow up for resolution. DKA in setting of T1DM: Please ensure insulin requirement and adjust regimen if needed given A1c of 14.4. Esophagitis: Please follow up on this. Patient denying any symptoms of dysphagia or GERD. Continue PPI if appropriate.  Hypokalemia: Check for resolution.   2.  Labs / imaging needed at time of follow-up: CBC, BMP  3.  Pending labs/ test needing follow-up: NA  Follow-up Appointments:  Follow-up Information     [name], NP. Call today.   Specialty: Nurse Practitioner Why: Call to make and appointment with your primary care provider for hospital follow up. Contact information: [place] [address] [phone]                 Hospital Course by problem list: Sepsis 2/2 to RLL Pneumonia  Acute Hypoxic Respiratory Failure Influenza A infection  Patient arrived  to ED with 12 days of nausea and vomiting. Initially presented with sinus tachycardia to 130's, normotensive and satting well on room air. Work-up in ED notable for positive influenza A. After one day of fluids, patient continued to be significantly tachycardic and intermittently requiring supplemental oxygen. CTA chest was then ordered, PE ruled out. However, CTA did show evidence of right lower lobe pneumonia. At that time ceftriaxone and vancomycin were initiated for  community-acquired pneumonia. Patient subsequently improved along with the sinus tachycardia. MRSA swab resulted negative and antibiotics were de-escalated to just ceftriaxone. On day 3 of hospitalization patient's symptoms had improved and he was able to tolerate PO well. He was discharged on 12/30 with instructions to complete three days of Augmentin, totaling 5d of antibiotics. Patient to follow-up with primary care physician in 7-10 days for hospital follow-up.    DKA, resolved T1DM Patient presented with hyperglycemia and BHB was 5.8. His A1c was 14.4. His symptoms and lab work was consistent with DKA. He endorsed compliance to his insulin regimen however with an A1c of 14.4, suspect difficulty with strict adherence. DKA resolved. He has been transitioned off endo-tool to subq insulin. Dextrose drip was discontinued once patient was tolerating po. On the follow up, please ensure patient is complaint with insulin regimen and the regimen is appropriate for him given hx of elevated A1c.   Esophagitis Esophagitis was noted on imaging which appears 2/2 to his prolonged vomiting. For the esophagitis he was first on IV pepcid then transitioned protonix 40 mg po. He is denying any symptoms of dysphagia or heartburn but we will discharge him 4 weeks of PPI to allow for healing of the esophagitis.      Hypokalemia Patient developed hypokalemia which may be secondary to poor intake and insulin ggt. Goal was to replete as needed until patient able to tolerate good PO. Total potassium of 200 mEq was given during hospitalization. Last BMP showed potassium of 3.4 but he received supplementation afterwards. He has good po intake. Please repeat BMP during hospital follow up.   Dehydration Hypernatremia AKI  Patient had significant dehydration on initial evaluation. He was given 4 L lactated ringers. This was shown by increased serum osm, hypernatremia at 150 and an AKI. He was started on D5 1/2 NS infusion with  endotool and hypernatremia resolved. The AKI resolved as well. These measures were discontinued once patient was able to tolerate po.   Discharge Subjective: Patient states he is doing well. He rested well overnight. Only symptoms are cough and sore throat. Cough has become nonproductive. Endorses good appetite and no n/v, abdominal pain, or dysphagia. Discharge Exam:   BP 119/63 (BP Location: Left Arm)    Pulse 95    Temp 99.1 F (37.3 C) (Oral)    Resp 20    Ht 6' (1.829 m)    Wt 68 kg    SpO2 95%    BMI 20.34 kg/m  Physical Exam General: Well-developed, NAD Head: Normocephalic without scalp lesions.  Eyes: PERRLA, Conjunctivae pink, sclerae white, without icterus.  Mouth and Throat: Lips normal color, without lesions. MMM Neck: Neck supple with full range of motion (ROM). No masses or tenderness. No thyromegaly. No TTP Lungs: CTAB, no wheeze, rhonchi or rales.  Cardiovascular: Normal heart sounds, no r/m/g, 2+ pulses in all extremities. No LE edema Abdomen: No TTP, normal bowel sounds MSK: No asymmetry or muscle atrophy. Full range of motion (ROM) of all joints.  Skin: warm, dry good skin turgor, no lesions  noted Neuro: Alert and oriented. CN grossly intact Psych: Normal mood and normal affect    Pertinent Labs, Studies, and Procedures:  CBC Latest Ref Rng & Units 08/29/2021 08/28/2021 08/28/2021  WBC 4.0 - 10.5 K/uL 14.2(H) 20.5(H) 20.1(H)  Hemoglobin 13.0 - 17.0 g/dL 10.2(L) 11.2(L) 11.4(L)  Hematocrit 39.0 - 52.0 % 31.1(L) 32.9(L) 33.3(L)  Platelets 150 - 400 K/uL 231 256 274    CMP Latest Ref Rng & Units 08/30/2021 08/29/2021 08/29/2021  Glucose 70 - 99 mg/dL 129(H) 190(H) 87  BUN 6 - 20 mg/dL <5(L) <5(L) 7  Creatinine 0.61 - 1.24 mg/dL 0.84 0.91 0.91  Sodium 135 - 145 mmol/L 138 136 138  Potassium 3.5 - 5.1 mmol/L 3.4(L) 3.2(L) 2.9(L)  Chloride 98 - 111 mmol/L 107 104 104  CO2 22 - 32 mmol/L '26 25 28  ' Calcium 8.9 - 10.3 mg/dL 7.6(L) 7.5(L) 7.7(L)  Total Protein 6.5 - 8.1  g/dL - - -  Total Bilirubin 0.3 - 1.2 mg/dL - - -  Alkaline Phos 38 - 126 U/L - - -  AST 15 - 41 U/L - - -  ALT 0 - 44 U/L - - -    BHB: 5.83 Serum osmolality: 329 DG Chest 2 View  Result Date: 08/27/2021 CLINICAL DATA:  Cough. EXAM: CHEST - 2 VIEW COMPARISON:  Chest radiograph dated 02/27/2021. IMPRESSION: No active cardiopulmonary disease. Electronically Signed   By: Anner Crete M.D.   On: 08/27/2021 03:25   CT Angio Chest Pulmonary Embolism (PE) W or WO Contrast  Result Date: 08/28/2021 CLINICAL DATA:  Productive cough.  Sinus tachycardia. EXAM: CT ANGIOGRAPHY CHEST WITH CONTRAST IMPRESSION: No definite evidence of pulmonary embolus. Multiple ill-defined airspace opacities are noted in the right lower lobe consistent with large pneumonia. Abnormal soft tissue density is seen within the right lower lobe bronchi concerning for aspiration or mucous plugging. Severe diffuse esophageal wall thickening is noted concerning for esophagitis. Endoscopy is recommended for further evaluation. Electronically Signed   By: Marijo Conception M.D.   On: 08/28/2021 10:41     Discharge Instructions: Discharge Instructions     Call MD for:  difficulty breathing, headache or visual disturbances   Complete by: As directed    Call MD for:  extreme fatigue   Complete by: As directed    Call MD for:  hives   Complete by: As directed    Call MD for:  persistant dizziness or light-headedness   Complete by: As directed    Call MD for:  persistant nausea and vomiting   Complete by: As directed    Call MD for:  redness, tenderness, or signs of infection (pain, swelling, redness, odor or green/yellow discharge around incision site)   Complete by: As directed    Call MD for:  severe uncontrolled pain   Complete by: As directed    Call MD for:  temperature >100.4   Complete by: As directed    Diet Carb Modified   Complete by: As directed    Discharge instructions   Complete by: As directed    You  presented to the hospital with nausea, vomiting and abdominal pain and were found to have elevated sugars. You stated your brother had flu and you had similar symptoms for 12 days. You tested positive for the flu. You had another episode of DKA which can occur during and infection and if you miss your insulin. We treated you with insulin and supportive care until you were able to eat  and drink well. Please use your insulin regularly and follow up with your PCP regarding this as you A1c was 14.4 which indicates your diabetes is not controlled. Your PCP might adjust your insulin to get better control of your diabetes to help you live a long and healthy live.   On imaging, we noticed a pneumonia which is a lung infection. We gave you antibiotics in the hospital and are discharging you with antibiotics. Please complete these. We are giving you a medication for cough and lozenges to help with the sore throat. The sore throat is coming for repeated coughing and irritation of your throat. Continue taking protonix which is to protect your esophagus as you had some irritation of that due to the vomiting.   Follow up with your primary care doctor in 10 days to ensure you are doing better and to the PCP needs to make any adjustments to your medications.   Increase activity slowly   Complete by: As directed        Signed: Idamae Schuller, MD Tillie Rung. [facility] Internal Medicine Residency, PGY-1  08/30/2021, 11:16 AM   Pager: [phone]

## 2021-08-30 NOTE — Progress Notes (Signed)
Hypoglycemic Event  CBG: 43  Treatment: 8 oz juice/soda  Symptoms: Sweaty  Follow-up CBG: Time:0547 CBG Result:91  Possible Reasons for Event: Inadequate meal intake  Comments/MD notified: Patients blood sugar was 43 upon recheck. Patient was sweaty and just not feeling well. Per order patient was given 8oz of juice and blood sugar was 91 at 15 minute recheck. Patient states he is feeling better.    [employer]

## 2021-08-30 NOTE — Progress Notes (Signed)
Patient discharging home. Ivs removed without complications. Tele removed and CCMD notified. Discharge instructions given and medication administration discussed. All questions answered. Will pick up meds from [facility] on the way out.   Joseph Barr

## 2021-09-02 LAB — CULTURE, BLOOD (ROUTINE X 2)
Culture: NO GROWTH
Culture: NO GROWTH
Special Requests: ADEQUATE
Special Requests: ADEQUATE

## 2021-09-03 ENCOUNTER — Telehealth: Payer: Self-pay

## 2021-09-03 NOTE — Telephone Encounter (Signed)
Transition Care Management Unsuccessful Follow-up Telephone Call  Date of discharge and from where:  08/30/2021, [facility]   Attempts:  1st Attempt  Reason for unsuccessful TCM follow-up call:  Left voice message on # [phone], call back requested to this CM. Need to schedule hospital follow up appointment

## 2021-09-04 ENCOUNTER — Telehealth: Payer: Self-pay

## 2021-09-04 NOTE — Telephone Encounter (Signed)
Transition Care Management Unsuccessful Follow-up Telephone Call  Date of discharge and from where:  08/30/2021, [facility]  Attempts:  2nd Attempt  Reason for unsuccessful TCM follow-up call:  Left voice message on # [phone], call back requested to this CM.  Need to discuss scheduling a hospital follow up appointment.

## 2021-09-05 ENCOUNTER — Telehealth: Payer: Self-pay

## 2021-09-05 NOTE — Telephone Encounter (Signed)
Transition Care Management Unsuccessful Follow-up Telephone Call  Date of discharge and from where:  08/30/2021, [facility]   Attempts:  3rd Attempt  Reason for unsuccessful TCM follow-up call:  Voice mail full # [phone]. Letter sent to patient requesting he call [facility] to schedule a hospital follow up appointment as we have not been able to reach him

## 2021-09-16 ENCOUNTER — Other Ambulatory Visit: Payer: Self-pay

## 2021-09-23 ENCOUNTER — Other Ambulatory Visit: Payer: Self-pay

## 2021-09-24 ENCOUNTER — Other Ambulatory Visit: Payer: Self-pay

## 2021-12-03 ENCOUNTER — Other Ambulatory Visit: Payer: Self-pay | Admitting: Nurse Practitioner

## 2021-12-03 ENCOUNTER — Other Ambulatory Visit: Payer: Self-pay

## 2021-12-03 ENCOUNTER — Other Ambulatory Visit: Payer: Self-pay | Admitting: Family

## 2021-12-03 DIAGNOSIS — E1010 Type 1 diabetes mellitus with ketoacidosis without coma: Secondary | ICD-10-CM

## 2021-12-04 ENCOUNTER — Other Ambulatory Visit: Payer: Self-pay

## 2021-12-09 ENCOUNTER — Inpatient Hospital Stay (HOSPITAL_COMMUNITY)
Admission: EM | Admit: 2021-12-09 | Discharge: 2021-12-12 | DRG: 638 | Disposition: A | Discharge status: Home / Self Care | Payer: Self-pay | Attending: General Practice | Admitting: General Practice

## 2021-12-09 ENCOUNTER — Encounter (HOSPITAL_COMMUNITY): Payer: Self-pay

## 2021-12-09 DIAGNOSIS — E860 Dehydration: Secondary | ICD-10-CM | POA: Diagnosis present

## 2021-12-09 DIAGNOSIS — D72829 Elevated white blood cell count, unspecified: Secondary | ICD-10-CM | POA: Diagnosis present

## 2021-12-09 DIAGNOSIS — E1110 Type 2 diabetes mellitus with ketoacidosis without coma: Secondary | ICD-10-CM | POA: Diagnosis present

## 2021-12-09 DIAGNOSIS — N179 Acute kidney failure, unspecified: Secondary | ICD-10-CM | POA: Diagnosis present

## 2021-12-09 DIAGNOSIS — E1010 Type 1 diabetes mellitus with ketoacidosis without coma: Principal | ICD-10-CM | POA: Diagnosis present

## 2021-12-09 DIAGNOSIS — K219 Gastro-esophageal reflux disease without esophagitis: Secondary | ICD-10-CM | POA: Diagnosis present

## 2021-12-09 DIAGNOSIS — R000 Tachycardia, unspecified: Secondary | ICD-10-CM | POA: Diagnosis present

## 2021-12-09 DIAGNOSIS — Z794 Long term (current) use of insulin: Secondary | ICD-10-CM

## 2021-12-09 DIAGNOSIS — Z87891 Personal history of nicotine dependence: Secondary | ICD-10-CM

## 2021-12-09 DIAGNOSIS — Z681 Body mass index (BMI) 19 or less, adult: Secondary | ICD-10-CM

## 2021-12-09 DIAGNOSIS — Z79899 Other long term (current) drug therapy: Secondary | ICD-10-CM

## 2021-12-09 DIAGNOSIS — Z8249 Family history of ischemic heart disease and other diseases of the circulatory system: Secondary | ICD-10-CM

## 2021-12-09 DIAGNOSIS — E440 Moderate protein-calorie malnutrition: Secondary | ICD-10-CM | POA: Insufficient documentation

## 2021-12-09 LAB — CBC
HCT: 45.1 % (ref 39.0–52.0)
Hemoglobin: 15.4 g/dL (ref 13.0–17.0)
MCH: 30 pg (ref 26.0–34.0)
MCHC: 34.1 g/dL (ref 30.0–36.0)
MCV: 87.9 fL (ref 80.0–100.0)
Platelets: 394 10*3/uL (ref 150–400)
RBC: 5.13 MIL/uL (ref 4.22–5.81)
RDW: 12 % (ref 11.5–15.5)
WBC: 13.8 10*3/uL — ABNORMAL HIGH (ref 4.0–10.5)
nRBC: 0 % (ref 0.0–0.2)

## 2021-12-09 LAB — COMPREHENSIVE METABOLIC PANEL
ALT: 18 U/L (ref 0–44)
AST: 18 U/L (ref 15–41)
Albumin: 3.8 g/dL (ref 3.5–5.0)
Alkaline Phosphatase: 91 U/L (ref 38–126)
Anion gap: 27 — ABNORMAL HIGH (ref 5–15)
BUN: 28 mg/dL — ABNORMAL HIGH (ref 6–20)
CO2: 10 mmol/L — ABNORMAL LOW (ref 22–32)
Calcium: 8.6 mg/dL — ABNORMAL LOW (ref 8.9–10.3)
Chloride: 93 mmol/L — ABNORMAL LOW (ref 98–111)
Creatinine, Ser: 1.32 mg/dL — ABNORMAL HIGH (ref 0.61–1.24)
GFR, Estimated: >60 mL/min (ref >60)
Glucose, Bld: 507 mg/dL (ref 70–99)
Potassium: 4.4 mmol/L (ref 3.5–5.1)
Sodium: 130 mmol/L — ABNORMAL LOW (ref 135–145)
Total Bilirubin: 1.7 mg/dL — ABNORMAL HIGH (ref 0.3–1.2)
Total Protein: 7.5 g/dL (ref 6.5–8.1)

## 2021-12-09 LAB — I-STAT CHEM 8, ED
BUN: 33 mg/dL — ABNORMAL HIGH (ref 6–20)
Calcium, Ion: 1.11 mmol/L — ABNORMAL LOW (ref 1.15–1.40)
Chloride: 93 mmol/L — ABNORMAL LOW (ref 98–111)
Creatinine, Ser: 1.1 mg/dL (ref 0.61–1.24)
Glucose, Bld: 601 mg/dL (ref 70–99)
HCT: 53 % — ABNORMAL HIGH (ref 39.0–52.0)
Hemoglobin: 18 g/dL — ABNORMAL HIGH (ref 13.0–17.0)
Potassium: 4.7 mmol/L (ref 3.5–5.1)
Sodium: 127 mmol/L — ABNORMAL LOW (ref 135–145)
TCO2: 12 mmol/L — ABNORMAL LOW (ref 22–32)

## 2021-12-09 LAB — BLOOD GAS, VENOUS
Acid-base deficit: 14.9 mmol/L — ABNORMAL HIGH (ref 0.0–2.0)
Bicarbonate: 13.2 mmol/L — ABNORMAL LOW (ref 20.0–28.0)
O2 Saturation: 69.4 %
Patient temperature: 37
pCO2, Ven: 38 mmHg — ABNORMAL LOW (ref 44–60)
pH, Ven: 7.15 — CL (ref 7.25–7.43)
pO2, Ven: 39 mmHg (ref 32–45)

## 2021-12-09 LAB — CBG MONITORING, ED
Glucose-Capillary: 576 mg/dL (ref 70–99)
Glucose-Capillary: 560 mg/dL (ref 70–99)

## 2021-12-09 LAB — LIPASE, BLOOD: Lipase: 22 U/L (ref 11–51)

## 2021-12-09 LAB — BETA-HYDROXYBUTYRIC ACID: Beta-Hydroxybutyric Acid: >8 mmol/L — ABNORMAL HIGH (ref 0.05–0.27)

## 2021-12-09 MED ORDER — DEXTROSE IN LACTATED RINGERS 5 % IV SOLN: INTRAVENOUS | Status: DC

## 2021-12-09 MED ORDER — LACTATED RINGERS IV BOLUS
360.0000 mL | Freq: Once | INTRAVENOUS | Status: AC
Start: 2021-12-09 — End: 2021-12-09
  Administered 2021-12-09: 360 mL via INTRAVENOUS

## 2021-12-09 MED ORDER — DEXTROSE 50 % IV SOLN: 0.0000 mL | INTRAVENOUS | Status: DC | PRN

## 2021-12-09 MED ORDER — LACTATED RINGERS IV BOLUS
1000.0000 mL | Freq: Once | INTRAVENOUS | Status: AC
  Administered 2021-12-09: 1000 mL via INTRAVENOUS

## 2021-12-09 MED ORDER — LACTATED RINGERS IV SOLN
INTRAVENOUS | Status: DC
Start: 2021-12-09 — End: 2021-12-10

## 2021-12-09 MED ORDER — ONDANSETRON HCL 4 MG/2ML IJ SOLN
4.0000 mg | Freq: Once | INTRAMUSCULAR | Status: AC
  Administered 2021-12-09: 4 mg via INTRAVENOUS
  Filled 2021-12-09: qty 2

## 2021-12-09 MED ORDER — INSULIN REGULAR(HUMAN) IN NACL 100-0.9 UT/100ML-% IV SOLN
INTRAVENOUS | Status: DC
  Administered 2021-12-09: 10 [IU]/h via INTRAVENOUS
  Administered 2021-12-10: 5.5 [IU]/h via INTRAVENOUS
  Administered 2021-12-10: 4 [IU]/h via INTRAVENOUS
  Administered 2021-12-10: 5.5 [IU]/h via INTRAVENOUS
  Filled 2021-12-09 (×2): qty 100

## 2021-12-09 MED ORDER — POTASSIUM CHLORIDE 10 MEQ/100ML IV SOLN
10.0000 meq | INTRAVENOUS | Status: AC
  Administered 2021-12-09 – 2021-12-10 (×2): 10 meq via INTRAVENOUS
  Filled 2021-12-09: qty 100

## 2021-12-09 MED ORDER — LACTATED RINGERS IV BOLUS: 20.0000 mL/kg | Freq: Once | INTRAVENOUS | Status: DC

## 2021-12-09 NOTE — ED Triage Notes (Signed)
Pt complains of nausea and vomiting for two days, pt is a diabetic and states that he's compliant with his meds, EMS cbg 221, ? ?Pt has a 20 g in Left hand and has received about 200 mg of fluid and 4 mg zofran ?

## 2021-12-09 NOTE — ED Provider Notes (Signed)
?[facility] DEPT ?Provider Note ? ? ?CSN: [id number] ?Arrival date & time: 12/09/21  2127 ? ?  ? ?History ? ?Chief Complaint  ?Patient presents with  ? Nausea  ?  vomiting  ? ? ?Joseph Barr is a 26 y.o. male.  With past medical history of type 1 diabetes complicated by DKA who presents to the emergency department with nausea and vomiting. ? ?Patient states that he has been feeling poorly since Saturday.  He states that he has had nausea and vomiting and abdominal pain.  He denies any recent illnesses.  He states that he has been taking his insulin as prescribed.  Denies any fevers, diarrhea, cough.  He has had decreased p.o. intake since Saturday and states that he has not "barely had anything to eat since Saturday."  He has tolerated a few liquids since Saturday. ? ? ?HPI ? ?  ? ?Home Medications ?Prior to Admission medications   ?Medication Sig Start Date End Date Taking? Authorizing Provider  ?acetaminophen (TYLENOL) 325 MG tablet Take 2 tablets (650 mg total) by mouth every 6 (six) hours as needed for mild pain (or Fever >/= 101). 08/30/21   Idamae Schuller, MD  ?Blood Glucose Monitoring Suppl (TRUE METRIX METER) w/Device KIT Use as directed 12/21/20   Camillia Herter, NP  ?glucose blood (ACCU-CHEK AVIVA) test strip 1 each by Other route 4 (four) times daily. And lancets 250.03 06/26/14   Renato Shin, MD  ?glucose blood (TRUE METRIX BLOOD GLUCOSE TEST) test strip Use as instructed 01/08/21   Camillia Herter, NP  ?guaiFENesin-dextromethorphan (ROBITUSSIN DM) 100-10 MG/5ML syrup Take 10 mLs by mouth every 4 (four) hours as needed for cough. 08/30/21   Idamae Schuller, MD  ?insulin aspart (NOVOLOG FLEXPEN) 100 UNIT/ML FlexPen Inject 12-20 Units into the skin 3 (three) times daily with meals. Sliding scale ?Patient taking differently: Inject 12-20 Units into the skin 3 (three) times daily with meals. Sliding scale : 1-10 Ratio 12/21/20   Camillia Herter, NP  ?insulin glargine (LANTUS  SOLOSTAR) 100 UNIT/ML Solostar Pen Inject 36 Units into the skin daily. ?Patient taking differently: Inject 34 Units into the skin daily. 07/05/21 01/05/22  Camillia Herter, NP  ?Insulin Pen Needle 31G X 5 MM MISC Use as directed in the morning, at noon, and at bedtime. 07/05/21   [name], NP  ?menthol-cetylpyridinium (CEPACOL) 3 MG lozenge Take 1 lozenge (3 mg total) by mouth every 4 (four) hours as needed for sore throat. 08/30/21   Idamae Schuller, MD  ?pantoprazole (PROTONIX) 40 MG tablet Take 1 tablet (40 mg total) by mouth daily. 08/31/21 09/30/21  Idamae Schuller, MD  ?TRUEplus Lancets 28G MISC Use as directed 01/08/21   Camillia Herter, NP  ?   ? ?Allergies    ?Patient has no known allergies.   ? ?Review of Systems   ?Review of Systems  ?Constitutional:  Positive for appetite change and fatigue.  ?Respiratory:  Negative for cough and shortness of breath.   ?Cardiovascular:  Negative for chest pain.  ?Gastrointestinal:  Positive for abdominal pain, nausea and vomiting. Negative for abdominal distention and diarrhea.  ?Genitourinary:  Negative for dysuria.  ?All other systems reviewed and are negative. ? ?Physical Exam ?Updated Vital Signs ?BP 107/82   Pulse (!) 131   Temp (!) 97.4 ?F (36.3 ?C) (Axillary)   Resp 16   SpO2 100%  ?Physical Exam ?Vitals and nursing note reviewed.  ?Constitutional:   ?   General:  He is in acute distress.  ?   Appearance: He is underweight. He is ill-appearing. He is not toxic-appearing or diaphoretic.  ?HENT:  ?   Head: Normocephalic and atraumatic.  ?   Nose: Nose normal.  ?   Mouth/Throat:  ?   Mouth: Mucous membranes are moist.  ?   Pharynx: Oropharynx is clear.  ?Eyes:  ?   General: No scleral icterus. ?   Extraocular Movements: Extraocular movements intact.  ?Cardiovascular:  ?   Rate and Rhythm: Regular rhythm. Tachycardia present.  ?   Pulses: Normal pulses.  ?   Heart sounds: Normal heart sounds. No murmur heard. ?Pulmonary:  ?   Effort: Pulmonary effort is normal. No  respiratory distress.  ?   Breath sounds: Normal breath sounds.  ?Abdominal:  ?   General: Abdomen is flat. Bowel sounds are decreased. There is no distension.  ?   Palpations: Abdomen is soft.  ?   Tenderness: There is generalized abdominal tenderness.  ?Musculoskeletal:     ?   General: Normal range of motion.  ?   Cervical back: Neck supple.  ?Skin: ?   General: Skin is warm and dry.  ?   Capillary Refill: Capillary refill takes less than 2 seconds.  ?   Findings: No rash.  ?Neurological:  ?   General: No focal deficit present.  ?   Mental Status: He is alert and oriented to person, place, and time. Mental status is at baseline.  ?Psychiatric:     ?   Mood and Affect: Mood normal.     ?   Behavior: Behavior normal.     ?   Thought Content: Thought content normal.     ?   Judgment: Judgment normal.  ? ? ?ED Results / Procedures / Treatments   ?Labs ?(all labs ordered are listed, but only abnormal results are displayed) ?Labs Reviewed  ?COMPREHENSIVE METABOLIC PANEL - Abnormal; Notable for the following components:  ?    Result Value  ? Sodium 130 (*)   ? Chloride 93 (*)   ? CO2 10 (*)   ? Glucose, Bld 507 (*)   ? BUN 28 (*)   ? Creatinine, Ser 1.32 (*)   ? Calcium 8.6 (*)   ? Total Bilirubin 1.7 (*)   ? Anion gap 27 (*)   ? All other components within normal limits  ?CBC - Abnormal; Notable for the following components:  ? WBC 13.8 (*)   ? All other components within normal limits  ?BLOOD GAS, VENOUS - Abnormal; Notable for the following components:  ? pH, Ven 7.15 (*)   ? pCO2, Ven 38 (*)   ? Bicarbonate 13.2 (*)   ? Acid-base deficit 14.9 (*)   ? All other components within normal limits  ?CBG MONITORING, ED - Abnormal; Notable for the following components:  ? Glucose-Capillary 576 (*)   ? All other components within normal limits  ?I-STAT CHEM 8, ED - Abnormal; Notable for the following components:  ? Sodium 127 (*)   ? Chloride 93 (*)   ? BUN 33 (*)   ? Glucose, Bld 601 (*)   ? Calcium, Ion 1.11 (*)   ? TCO2  12 (*)   ? Hemoglobin 18.0 (*)   ? HCT 53.0 (*)   ? All other components within normal limits  ?CBG MONITORING, ED - Abnormal; Notable for the following components:  ? Glucose-Capillary 560 (*)   ? All other components within normal limits  ?  LIPASE, BLOOD  ?URINALYSIS, ROUTINE W REFLEX MICROSCOPIC  ?BETA-HYDROXYBUTYRIC ACID  ?BETA-HYDROXYBUTYRIC ACID  ?BETA-HYDROXYBUTYRIC ACID  ? ?EKG ?None ? ?Radiology ?No results found. ? ?Procedures ?Marland KitchenCritical Care ?Performed by: Mickie Hillier, PA-C ?Authorized by: Mickie Hillier, PA-C  ? ?Critical care provider statement:  ?  Critical care time (minutes):  40 ?  Critical care time was exclusive of:  Separately billable procedures and treating other patients ?  Critical care was necessary to treat or prevent imminent or life-threatening deterioration of the following conditions:  Endocrine crisis ?  Critical care was time spent personally by me on the following activities:  Development of treatment plan with patient or surrogate, discussions with consultants, discussions with primary provider, evaluation of patient's response to treatment, examination of patient, interpretation of cardiac output measurements, obtaining history from patient or surrogate, review of old charts, re-evaluation of patient's condition, pulse oximetry, ordering and review of radiographic studies, ordering and review of laboratory studies and ordering and performing treatments and interventions ?  I assumed direction of critical care for this patient from another provider in my specialty: no   ?  Care discussed with: admitting provider    ? ?Medications Ordered in ED ?Medications  ?lactated ringers bolus 1,360 mL (has no administration in time range)  ?insulin regular, human (MYXREDLIN) 100 units/ 100 mL infusion (has no administration in time range)  ?lactated ringers infusion (has no administration in time range)  ?dextrose 5 % in lactated ringers infusion (has no administration in time range)   ?dextrose 50 % solution 0-50 mL (has no administration in time range)  ?potassium chloride 10 mEq in 100 mL IVPB (has no administration in time range)  ?lactated ringers bolus 1,000 mL (1,000 mLs Intravenous New Bag/Give

## 2021-12-10 ENCOUNTER — Encounter (HOSPITAL_COMMUNITY): Payer: Self-pay | Admitting: Internal Medicine

## 2021-12-10 ENCOUNTER — Other Ambulatory Visit: Payer: Self-pay

## 2021-12-10 ENCOUNTER — Observation Stay (HOSPITAL_COMMUNITY): Payer: Self-pay

## 2021-12-10 DIAGNOSIS — D72829 Elevated white blood cell count, unspecified: Secondary | ICD-10-CM | POA: Diagnosis present

## 2021-12-10 DIAGNOSIS — K219 Gastro-esophageal reflux disease without esophagitis: Secondary | ICD-10-CM | POA: Diagnosis present

## 2021-12-10 DIAGNOSIS — E860 Dehydration: Secondary | ICD-10-CM | POA: Diagnosis present

## 2021-12-10 DIAGNOSIS — E440 Moderate protein-calorie malnutrition: Secondary | ICD-10-CM | POA: Insufficient documentation

## 2021-12-10 LAB — GLUCOSE, CAPILLARY
Glucose-Capillary: 381 mg/dL — ABNORMAL HIGH (ref 70–99)
Glucose-Capillary: 358 mg/dL — ABNORMAL HIGH (ref 70–99)
Glucose-Capillary: 260 mg/dL — ABNORMAL HIGH (ref 70–99)
Glucose-Capillary: 212 mg/dL — ABNORMAL HIGH (ref 70–99)
Glucose-Capillary: 192 mg/dL — ABNORMAL HIGH (ref 70–99)
Glucose-Capillary: 198 mg/dL — ABNORMAL HIGH (ref 70–99)
Glucose-Capillary: 199 mg/dL — ABNORMAL HIGH (ref 70–99)
Glucose-Capillary: 197 mg/dL — ABNORMAL HIGH (ref 70–99)
Glucose-Capillary: 195 mg/dL — ABNORMAL HIGH (ref 70–99)
Glucose-Capillary: 213 mg/dL — ABNORMAL HIGH (ref 70–99)
Glucose-Capillary: 164 mg/dL — ABNORMAL HIGH (ref 70–99)
Glucose-Capillary: 154 mg/dL — ABNORMAL HIGH (ref 70–99)
Glucose-Capillary: 160 mg/dL — ABNORMAL HIGH (ref 70–99)
Glucose-Capillary: 210 mg/dL — ABNORMAL HIGH (ref 70–99)
Glucose-Capillary: 201 mg/dL — ABNORMAL HIGH (ref 70–99)
Glucose-Capillary: 209 mg/dL — ABNORMAL HIGH (ref 70–99)
Glucose-Capillary: 188 mg/dL — ABNORMAL HIGH (ref 70–99)
Glucose-Capillary: 213 mg/dL — ABNORMAL HIGH (ref 70–99)
Glucose-Capillary: 221 mg/dL — ABNORMAL HIGH (ref 70–99)
Glucose-Capillary: 229 mg/dL — ABNORMAL HIGH (ref 70–99)
Glucose-Capillary: 190 mg/dL — ABNORMAL HIGH (ref 70–99)
Glucose-Capillary: 136 mg/dL — ABNORMAL HIGH (ref 70–99)

## 2021-12-10 LAB — BETA-HYDROXYBUTYRIC ACID
Beta-Hydroxybutyric Acid: 4.66 mmol/L — ABNORMAL HIGH (ref 0.05–0.27)
Beta-Hydroxybutyric Acid: 2.47 mmol/L — ABNORMAL HIGH (ref 0.05–0.27)
Beta-Hydroxybutyric Acid: 0.33 mmol/L — ABNORMAL HIGH (ref 0.05–0.27)

## 2021-12-10 LAB — COMPREHENSIVE METABOLIC PANEL
ALT: 16 U/L (ref 0–44)
AST: 15 U/L (ref 15–41)
Albumin: 4.1 g/dL (ref 3.5–5.0)
Alkaline Phosphatase: 92 U/L (ref 38–126)
Anion gap: 18 — ABNORMAL HIGH (ref 5–15)
BUN: 32 mg/dL — ABNORMAL HIGH (ref 6–20)
CO2: 21 mmol/L — ABNORMAL LOW (ref 22–32)
Calcium: 9.8 mg/dL (ref 8.9–10.3)
Chloride: 96 mmol/L — ABNORMAL LOW (ref 98–111)
Creatinine, Ser: 1.14 mg/dL (ref 0.61–1.24)
GFR, Estimated: >60 mL/min (ref >60)
Glucose, Bld: 179 mg/dL — ABNORMAL HIGH (ref 70–99)
Potassium: 3.6 mmol/L (ref 3.5–5.1)
Sodium: 135 mmol/L (ref 135–145)
Total Bilirubin: 1.2 mg/dL (ref 0.3–1.2)
Total Protein: 7.9 g/dL (ref 6.5–8.1)

## 2021-12-10 LAB — BASIC METABOLIC PANEL
Anion gap: 24 — ABNORMAL HIGH (ref 5–15)
Anion gap: 16 — ABNORMAL HIGH (ref 5–15)
Anion gap: 8 (ref 5–15)
BUN: 33 mg/dL — ABNORMAL HIGH (ref 6–20)
BUN: 30 mg/dL — ABNORMAL HIGH (ref 6–20)
BUN: 22 mg/dL — ABNORMAL HIGH (ref 6–20)
CO2: 14 mmol/L — ABNORMAL LOW (ref 22–32)
CO2: 25 mmol/L (ref 22–32)
CO2: 24 mmol/L (ref 22–32)
Calcium: 9.5 mg/dL (ref 8.9–10.3)
Calcium: 9.6 mg/dL (ref 8.9–10.3)
Calcium: 8.8 mg/dL — ABNORMAL LOW (ref 8.9–10.3)
Chloride: 93 mmol/L — ABNORMAL LOW (ref 98–111)
Chloride: 95 mmol/L — ABNORMAL LOW (ref 98–111)
Chloride: 98 mmol/L (ref 98–111)
Creatinine, Ser: 1.55 mg/dL — ABNORMAL HIGH (ref 0.61–1.24)
Creatinine, Ser: 1.17 mg/dL (ref 0.61–1.24)
Creatinine, Ser: 0.92 mg/dL (ref 0.61–1.24)
GFR, Estimated: >60 mL/min (ref >60)
GFR, Estimated: >60 mL/min (ref >60)
GFR, Estimated: >60 mL/min (ref >60)
Glucose, Bld: 370 mg/dL — ABNORMAL HIGH (ref 70–99)
Glucose, Bld: 190 mg/dL — ABNORMAL HIGH (ref 70–99)
Glucose, Bld: 186 mg/dL — ABNORMAL HIGH (ref 70–99)
Potassium: 3.8 mmol/L (ref 3.5–5.1)
Potassium: 3.8 mmol/L (ref 3.5–5.1)
Potassium: 3.7 mmol/L (ref 3.5–5.1)
Sodium: 131 mmol/L — ABNORMAL LOW (ref 135–145)
Sodium: 136 mmol/L (ref 135–145)
Sodium: 130 mmol/L — ABNORMAL LOW (ref 135–145)

## 2021-12-10 LAB — CBC WITH DIFFERENTIAL/PLATELET
Abs Immature Granulocytes: 0.23 10*3/uL — ABNORMAL HIGH (ref 0.00–0.07)
Basophils Absolute: 0.1 10*3/uL (ref 0.0–0.1)
Basophils Relative: 1 %
Eosinophils Absolute: 0.2 10*3/uL (ref 0.0–0.5)
Eosinophils Relative: 1 %
HCT: 45.3 % (ref 39.0–52.0)
Hemoglobin: 16.3 g/dL (ref 13.0–17.0)
Immature Granulocytes: 1 %
Lymphocytes Relative: 12 %
Lymphs Abs: 3 10*3/uL (ref 0.7–4.0)
MCH: 30.2 pg (ref 26.0–34.0)
MCHC: 36 g/dL (ref 30.0–36.0)
MCV: 83.9 fL (ref 80.0–100.0)
Monocytes Absolute: 2.5 10*3/uL — ABNORMAL HIGH (ref 0.1–1.0)
Monocytes Relative: 10 %
Neutro Abs: 18.2 10*3/uL — ABNORMAL HIGH (ref 1.7–7.7)
Neutrophils Relative %: 75 %
Platelets: 416 10*3/uL — ABNORMAL HIGH (ref 150–400)
RBC: 5.4 MIL/uL (ref 4.22–5.81)
RDW: 11.8 % (ref 11.5–15.5)
WBC: 24.3 10*3/uL — ABNORMAL HIGH (ref 4.0–10.5)
nRBC: 0 % (ref 0.0–0.2)

## 2021-12-10 LAB — RAPID URINE DRUG SCREEN, HOSP PERFORMED
Amphetamines: NOT DETECTED
Barbiturates: NOT DETECTED
Benzodiazepines: NOT DETECTED
Cocaine: POSITIVE — AB
Opiates: NOT DETECTED
Tetrahydrocannabinol: NOT DETECTED

## 2021-12-10 LAB — URINALYSIS, ROUTINE W REFLEX MICROSCOPIC
Bacteria, UA: NONE SEEN
Bilirubin Urine: NEGATIVE
Glucose, UA: >=500 mg/dL — AB
Hgb urine dipstick: NEGATIVE
Ketones, ur: 80 mg/dL — AB
Leukocytes,Ua: NEGATIVE
Nitrite: NEGATIVE
Protein, ur: 30 mg/dL — AB
Specific Gravity, Urine: 1.022 (ref 1.005–1.030)
pH: 6 (ref 5.0–8.0)

## 2021-12-10 LAB — CBG MONITORING, ED
Glucose-Capillary: 490 mg/dL — ABNORMAL HIGH (ref 70–99)
Glucose-Capillary: 506 mg/dL (ref 70–99)

## 2021-12-10 LAB — MRSA NEXT GEN BY PCR, NASAL: MRSA by PCR Next Gen: NOT DETECTED

## 2021-12-10 LAB — MAGNESIUM
Magnesium: 2.1 mg/dL (ref 1.7–2.4)
Magnesium: 1.9 mg/dL (ref 1.7–2.4)

## 2021-12-10 LAB — CK: Total CK: 22 U/L — ABNORMAL LOW (ref 49–397)

## 2021-12-10 LAB — PHOSPHORUS: Phosphorus: 2.5 mg/dL (ref 2.5–4.6)

## 2021-12-10 MED ORDER — KCL IN DEXTROSE-NACL 10-5-0.45 MEQ/L-%-% IV SOLN
INTRAVENOUS | Status: DC
  Filled 2021-12-10 (×7): qty 1000

## 2021-12-10 MED ORDER — GLUCERNA SHAKE PO LIQD
237.0000 mL | Freq: Two times a day (BID) | ORAL | Status: DC
  Administered 2021-12-11 – 2021-12-12 (×3): 237 mL via ORAL
  Filled 2021-12-10 (×5): qty 237

## 2021-12-10 MED ORDER — LACTATED RINGERS IV BOLUS: 1000.0000 mL | Freq: Once | INTRAVENOUS | Status: AC

## 2021-12-10 MED ORDER — ONDANSETRON HCL 4 MG/2ML IJ SOLN
4.0000 mg | Freq: Four times a day (QID) | INTRAMUSCULAR | Status: DC | PRN
  Administered 2021-12-10: 4 mg via INTRAVENOUS
  Filled 2021-12-10: qty 2

## 2021-12-10 MED ORDER — ACETAMINOPHEN 325 MG PO TABS: 650.0000 mg | ORAL_TABLET | Freq: Four times a day (QID) | ORAL | Status: DC | PRN

## 2021-12-10 MED ORDER — FENTANYL CITRATE PF 50 MCG/ML IJ SOSY: 50.0000 ug | PREFILLED_SYRINGE | INTRAMUSCULAR | Status: DC | PRN

## 2021-12-10 MED ORDER — CHLORHEXIDINE GLUCONATE CLOTH 2 % EX PADS
6.0000 | MEDICATED_PAD | Freq: Every day | CUTANEOUS | Status: DC
  Administered 2021-12-10 – 2021-12-12 (×3): 6 via TOPICAL

## 2021-12-10 MED ORDER — LACTATED RINGERS IV BOLUS
1000.0000 mL | Freq: Once | INTRAVENOUS | Status: AC
  Administered 2021-12-10: 1000 mL via INTRAVENOUS

## 2021-12-10 MED ORDER — NALOXONE HCL 0.4 MG/ML IJ SOLN: 0.4000 mg | INTRAMUSCULAR | Status: DC | PRN

## 2021-12-10 MED ORDER — SODIUM CHLORIDE 0.45 % IV SOLN
INTRAVENOUS | Status: DC
  Filled 2021-12-10: qty 1000

## 2021-12-10 MED ORDER — BOOST / RESOURCE BREEZE PO LIQD CUSTOM: 1.0000 | Freq: Three times a day (TID) | ORAL | Status: DC

## 2021-12-10 MED ORDER — ACETAMINOPHEN 650 MG RE SUPP: 650.0000 mg | Freq: Four times a day (QID) | RECTAL | Status: DC | PRN

## 2021-12-10 MED ORDER — ORAL CARE MOUTH RINSE
15.0000 mL | Freq: Two times a day (BID) | OROMUCOSAL | Status: DC
  Administered 2021-12-10 – 2021-12-12 (×4): 15 mL via OROMUCOSAL

## 2021-12-10 MED ORDER — LORAZEPAM 2 MG/ML IJ SOLN
0.5000 mg | Freq: Four times a day (QID) | INTRAMUSCULAR | Status: DC | PRN
Start: 2021-12-10 — End: 2021-12-12
  Administered 2021-12-10 (×2): 0.5 mg via INTRAVENOUS
  Filled 2021-12-10 (×2): qty 1

## 2021-12-10 MED ORDER — PANTOPRAZOLE SODIUM 40 MG IV SOLR
40.0000 mg | Freq: Every day | INTRAVENOUS | Status: DC
Start: 2021-12-10 — End: 2021-12-11
  Administered 2021-12-10 (×2): 40 mg via INTRAVENOUS
  Filled 2021-12-10 (×3): qty 10

## 2021-12-10 MED ORDER — PROSOURCE PLUS PO LIQD
30.0000 mL | Freq: Three times a day (TID) | ORAL | Status: DC
  Administered 2021-12-10 – 2021-12-11 (×3): 30 mL via ORAL
  Filled 2021-12-10 (×4): qty 30

## 2021-12-10 MED ORDER — ADULT MULTIVITAMIN W/MINERALS CH
1.0000 | ORAL_TABLET | Freq: Every day | ORAL | Status: DC
  Administered 2021-12-10 – 2021-12-12 (×3): 1 via ORAL
  Filled 2021-12-10 (×3): qty 1

## 2021-12-10 NOTE — Progress Notes (Signed)
Initial Nutrition Assessment ? ?DOCUMENTATION CODES:  ? ?Non-severe (moderate) malnutrition in context of chronic illness, Underweight ? ?INTERVENTION:  ?- diet advancement as medically feasible. ? ?- will order Glucerna Shake BID, each supplement provides 220 kcal and 10 grams of protein. ? ?- will order 30 ml Prosource Plus TID, each supplement provides 100 kcal and 15 grams protein.  ? ?- will order 1 tablet multivitamin with minerals daily. ? ?- entered Carbohydrate Counting for People with Diabetes from the [employer] into AVS. ? ? ?NUTRITION DIAGNOSIS:  ? ?Moderate Malnutrition related to chronic illness (uncontrolled type 1 DM) as evidenced by moderate fat depletion, moderate muscle depletion. ? ?GOAL:  ? ?Patient will meet greater than or equal to 90% of their needs ? ?MONITOR:  ? ?Diet advancement, PO intake, Supplement acceptance, Labs, Weight trends ? ?REASON FOR ASSESSMENT:  ? ?Malnutrition Screening Tool ? ?ASSESSMENT:  ? ?26 y.o. male with medical history of type 1 DM with most recent hemoglobin A1c 14.4% in 08/2021 and GERD. He was admitted on 4/10 due to DKA after presenting from home d/t N/V x3-4 days and abdominal pain. He reported intermittent use of marijuana. ? ?Talked with RN prior to visit to patient's room. Patient laying in bed with no visitors present at the time of RD visit. Patient is on CLD and had a cup with ice and diet ginger ale on bedside table. He requested another cup of ice with diet ginger ale at the end of RD visit which was provided to him.  ? ?Patient reports that the last time he ate solid food was on 4/8. He usually eats 4-5 times/day which is a mix of snacks (such as peanut butter crackers or fruit) and meals (which consist of a protein source and vegetable and/or starch). ? ?Patient checks his CBG each time before he eats and range is 150-250 mg/dl on average.  ? ?He does count carbs but does not aim for a particular number of carbs per meal or  snack. ? ?He denies having any changes in weight or the way his clothes have been fitting recently.  ? ?Weight today is documented as 125 lb and weight on 08/27/21 was documented as 150 lb. This would indicate 25 lb weight loss (16.7% body weight) in the past 3.5 months which is significant for time frame. ? ?Patient denies nausea today and shares that abdominal pain has significantly subsided.  ? ? ?Labs reviewed; CBGs: 154-506 mg/dl since midnight, Cl: 95 mmol/l, BUN: 30 mg/dl. ? ?Medications reviewed; 40 mg IV protonix/day, 10 mEq IV KCl x2 runs 4/10. ? ?IVF; D5-1/2 NS-10 mEq IV KCl @ 125 ml/hr (510 kcal/24 hrs) ?  ? ?NUTRITION - FOCUSED PHYSICAL EXAM: ? ?Flowsheet Row Most Recent Value  ?Orbital Region Moderate depletion  ?Upper Arm Region Moderate depletion  ?Thoracic and Lumbar Region Unable to assess  ?Buccal Region Moderate depletion  ?Temple Region Moderate depletion  ?Clavicle Bone Region Moderate depletion  ?Clavicle and Acromion Bone Region Moderate depletion  ?Scapular Bone Region Moderate depletion  ?Dorsal Hand Mild depletion  ?Patellar Region Moderate depletion  ?Anterior Thigh Region Moderate depletion  ?Posterior Calf Region Moderate depletion  ?Edema (RD Assessment) None  ?Hair Reviewed  ?Eyes Reviewed  ?Mouth Reviewed  ?Skin Reviewed  ?Nails Reviewed  ? ?  ? ? ?Diet Order:   ?Diet Order   ? ?       ?  Diet clear liquid Room service appropriate? Yes; Fluid consistency: Thin  Diet  effective now       ?  ? ?  ?  ? ?  ? ? ?EDUCATION NEEDS:  ? ?Education needs have been addressed ? ?Skin:  Skin Assessment: Reviewed RN Assessment ? ?Last BM:  PTA/unknown ? ?Height:  ? ?Ht Readings from Last 1 Encounters:  ?12/10/21 6' (1.829 m)  ? ? ?Weight:  ? ?Wt Readings from Last 1 Encounters:  ?12/10/21 56.7 kg  ? ? ?BMI:  Body mass index is 16.95 kg/m?. ? ?Estimated Nutritional Needs:  ?Kcal:  1900-2200 kcal ?Protein:  95-110 grams ?Fluid:  >/= 2.2 L/day ? ? ? ? ?Trenton Gammon, MS, RD, LDN ?Registered Dietitian  II ?Inpatient Clinical Nutrition ?RD pager # and on-call/weekend pager # available in AMION  ? ?

## 2021-12-10 NOTE — Hospital Course (Addendum)
26 y.o. male with medical history significant for type 1 diabetes mellitus with most recent hemoglobin A1c 14.4% in December 2022, GERD, who is admitted to [facility] on 12/09/2021 with DKA after presenting from home to [facility] ED complaining of nausea and vomiting.  ?  ?The patient reports to 3 days intermittent nausea 3-4 daily episodes of nonbloody, nonbilious emesis, with most recent such episode of vomiting occurring in the was in the emergency department this evening.  In the context of this intermittent nausea/vomiting, he reports significant decline in oral intake of both food and water over the last few days.  He also notes mild, sharp, nonradiating epigastric discomfort, that started after the first several episodes of vomiting.  Not associate with any diarrhea, melena, hematochezia, or rash.  He also denies any recent dysuria or gross hematuria. ?  ?Denies any associated subjective fever, chills, rigors, or generalized myalgias.  No recent headache, neck stiffness, sore throat, shortness of breath, wheezing, cough. No chest pain, diaphoresis, palpitations, dizziness.  No recent alcohol consumption.  He acknowledges intermittent use of marijuana. ?  ?  ?Denies any associated acute focal neurologic deficits, including no acute focal weakness, acute focal numbness, paresthesias, facial droop, dysarthria, expressive aphasia, acute change in vision, acute change in hearing, dysphagia, vertigo. ?  ?He acknowledges a history of type 1 diabetes mellitus, and reports good compliance with his outpatient basal and short acting insulin regimen, including Lantus 34 units subcu daily as well as sliding scale NovoLog 3 times daily with meals.  Denies any recent missed doses of either his basal or short acting insulin.  Most recent hemoglobin A 1C, per chart review, noted to be 14.4% in December 2022. ?  ?4/11 remains on Endotool, denies acute symptoms. No appetite. ?IVF bolus given. Denies using cocaine, but is + on  UDS. Denies recent infection or illness. Denies missing medications. Somewhat guarded. ? ?4/12 low doses endotool, anticipate he will transition over today. Restart diet.  ?Work on [employer], polysubstance abuse education and likely dc within 24-48 hours. ? ?

## 2021-12-10 NOTE — Assessment & Plan Note (Addendum)
#)   Acute Kidney Injury:  ?- Admission Cr 1.32  (0.84 in December 2022.) ?- prerenal in nature stemming from dehydration as a consequence of hyperglyemic-associated auto-diuresis, as above. UA demonstrates 30 protein, without additional urinary casts..  ?- Continue strict I&O, avoid nephrotoxic agents.  ?- Continue IVF's, giving bolus today, give tachycardia ?- Daily BMP for now ?

## 2021-12-10 NOTE — Assessment & Plan Note (Signed)
? ?#)   GERD: documented h/o such; on Protonix as outpatient.  ? ?Plan: In setting of current n.p.o. status, will convert daily oral Protonix to IV route of administration. ? ? ?

## 2021-12-10 NOTE — Assessment & Plan Note (Addendum)
Diabetic ketoacidosis:  ?Admission BG- 576 a/w, presenting blood gas consistent with metabolic acidosis,  while CMP demonstrates anion gap metabolic acidosis with serum bicarbonate of 10 and AG of 27, and beta hydroxybutyric acid found to be elevated. Additional presenting labs notable for corrected serum sodium of 136 and serum potassium of 4.4 ?----> Currently on Endotool transitioning off; ?- H/o T1DM, A1c of 14.4% in December 2022 ?- h/o prior hospitalizations for DKA ?- home rx: Lantus 34 units daily  ?+ SSI, NovoLog 3 times daily with meals,  ?UDS +cocaine, patient denying use or exposure. ?Continue endotool, wean as tolerated,  ?Needs DM education, appreciate Ailene Ards ?

## 2021-12-10 NOTE — Discharge Instructions (Addendum)

## 2021-12-10 NOTE — Progress Notes (Signed)
?Progress Note ? ? ?Patient: Joseph Barr JOA:[id number] DOB: 1996-07-04 DOA: 12/09/2021     0 ?DOS: the patient was seen and examined on 12/10/2021 ?  ?Brief hospital course: ?26 y.o. male with medical history significant for type 1 diabetes mellitus with most recent hemoglobin A1c 14.4% in December 2022, GERD, who is admitted to [facility] on 12/09/2021 with DKA after presenting from home to [facility] ED complaining of nausea and vomiting.  ?  ?The patient reports to 3 days intermittent nausea 3-4 daily episodes of nonbloody, nonbilious emesis, with most recent such episode of vomiting occurring in the was in the emergency department this evening.  In the context of this intermittent nausea/vomiting, he reports significant decline in oral intake of both food and water over the last few days.  He also notes mild, sharp, nonradiating epigastric discomfort, that started after the first several episodes of vomiting.  Not associate with any diarrhea, melena, hematochezia, or rash.  He also denies any recent dysuria or gross hematuria. ?  ?Denies any associated subjective fever, chills, rigors, or generalized myalgias.  No recent headache, neck stiffness, sore throat, shortness of breath, wheezing, cough. No chest pain, diaphoresis, palpitations, dizziness.  No recent alcohol consumption.  He acknowledges intermittent use of marijuana. ?  ?  ?Denies any associated acute focal neurologic deficits, including no acute focal weakness, acute focal numbness, paresthesias, facial droop, dysarthria, expressive aphasia, acute change in vision, acute change in hearing, dysphagia, vertigo. ?  ?He acknowledges a history of type 1 diabetes mellitus, and reports good compliance with his outpatient basal and short acting insulin regimen, including Lantus 34 units subcu daily as well as sliding scale NovoLog 3 times daily with meals.  Denies any recent missed doses of either his basal or short acting insulin.  Most recent hemoglobin A  1C, per chart review, noted to be 14.4% in December 2022. ?  ?4/11 remains on Endotool, denies acute symptoms. No appetite. ?IVF bolus given. Denies using cocaine, but is + on UDS. Denies recent infection or illness. Denies missing medications. Somewhat guarded. ? ?Assessment and Plan: ?* DKA (diabetic ketoacidosis) (HCC) ?#) Diabetic ketoacidosis:  ?Admission BG- 576 a/w, presenting blood gas consistent with metabolic acidosis,  while CMP demonstrates anion gap metabolic acidosis with serum bicarbonate of 10 and AG of 27, and beta hydroxybutyric acid found to be elevated. Additional presenting labs notable for corrected serum sodium of 136 and serum potassium of 4.4 ?----> Currently on Endotool 3.8u/Hr ?- H/o T1DM, A1c of 14.4% in December 2022 ?- h/o prior hospitalizations for DKA ?- home rx: Lantus 34 units daily  ?+ SSI, NovoLog 3 times daily with meals,  ?UDS +cocaine, patient denying use or exposure. ?Continue endotool, wean as tolerated,  ?B-HB 2.47 ? ? ?GERD (gastroesophageal reflux disease) ? ? ?#) GERD: documented h/o such; on Protonix as outpatient.  ? ?Plan: In setting of current n.p.o. status, will convert daily oral Protonix to IV route of administration. ? ? ? ?Leukocytosis ?Chest x-ray without evidence of intrathoracic process such as pneumonia or pleural effusion. ?Urinalysis without evidence of acute urinary tract infection. ?No obvious bruises or open wounds. No other sources of infection. Does not have any lower extremity peripheral extremity swelling. ? ? ? ?Dehydration ?#) Dehydration:  ? ?Plan: Monitor strict I's and O's.  Daily weights.  Repeat BMP in the morning. IVF's as described above.  ? ? ? ? ?AKI (acute kidney injury) (HCC) ?#) Acute Kidney Injury:  ?- Admission Cr 1.32  (  0.84 in December 2022.) ?- prerenal in nature stemming from dehydration as a consequence of hyperglyemic-associated auto-diuresis, as above. UA demonstrates 30 protein, without additional urinary casts..  ?- Continue  strict I&O, avoid nephrotoxic agents.  ?- Continue IVF's, giving bolus today, give tachycardia ?- Daily BMP for now ? ? ?  ? ?Subjective: ? ? ? ?Physical Exam: ?Vitals:  ? 12/10/21 1400 12/10/21 1500 12/10/21 1533 12/10/21 1600  ?BP: 112/71     ?Pulse: (!) 121 (!) 119  (!) 118  ?Resp: 15 16  15   ?Temp:   99 ?F (37.2 ?C)   ?TempSrc:   Oral   ?SpO2: 100% 100%  100%  ?Weight:      ?Height:      ? ?General: appears to be stated age; alert, oriented ?Skin: warm, dry, no rash ?Head:  AT/[place] ?Mouth:  Oral mucosa membranes appear dry, normal dentition ?Neck: supple; trachea midline ?Heart:  RRR; did not appreciate any M/R/G ?Lungs: CTAB, did not appreciate any wheezes, rales, or rhonchi ?Abdomen: + BS; soft, ND, mild epigastric discomfort with palpation, but in the absence of any associated guarding, rigidity, or rebound tenderness. ?Vascular: 2+ pedal pulses b/l; 2+ radial pulses b/l ?Extremities: no peripheral edema, no muscle wasting ?Neuro: strength and sensation intact in upper and lower extremities b/l ?  ?Data Reviewed: ?Reviewed the lab work from today given the recommendations and put in orders as needed. ? ?Disposition: ?Status is: Observation ?The patient will require care spanning > 2 midnights and should be moved to inpatient because:  DKA management.  ? Planned Discharge Destination: Home ? ? ? ?Time spent: 35 minutes ? ?Author: ? , MD ?12/10/2021 4:28 PM ? ?For on call review [email or web address].  ?

## 2021-12-10 NOTE — Plan of Care (Signed)
Patient has not voided, but did have approximately emesis overnight. HR remains between 130-140 overnight. Still on Insulin drip. ? ? ?Problem: Education: ?Goal: Ability to describe self-care measures that may prevent or decrease complications (Diabetes Survival Skills Education) will improve ?Outcome: Progressing ?Goal: Individualized Educational Video(s) ?Outcome: Progressing ?  ?Problem: Cardiac: ?Goal: Ability to maintain an adequate cardiac output will improve ?Outcome: Progressing ?  ?Problem: Health Behavior/Discharge Planning: ?Goal: Ability to identify and utilize available resources and services will improve ?Outcome: Progressing ?Goal: Ability to manage health-related needs will improve ?Outcome: Progressing ?  ?Problem: Fluid Volume: ?Goal: Ability to achieve a balanced intake and output will improve ?Outcome: Progressing ?  ?Problem: Metabolic: ?Goal: Ability to maintain appropriate glucose levels will improve ?Outcome: Progressing ?  ?Problem: Nutritional: ?Goal: Maintenance of adequate nutrition will improve ?Outcome: Progressing ?Goal: Maintenance of adequate weight for body size and type will improve ?Outcome: Progressing ?  ?Problem: Respiratory: ?Goal: Will regain and/or maintain adequate ventilation ?Outcome: Progressing ?  ?Problem: Urinary Elimination: ?Goal: Ability to achieve and maintain adequate renal perfusion and functioning will improve ?Outcome: Progressing ?  ?

## 2021-12-10 NOTE — Assessment & Plan Note (Addendum)
#)   Dehydration:  ? ?Plan: Monitor strict I's and O's.  Daily weights.  Repeat BMP in the morning. IVF's as described above.  ? ? ? ?

## 2021-12-10 NOTE — Progress Notes (Signed)
Inpatient Diabetes Program Recommendations ? ?AACE/ADA: New Consensus Statement on Inpatient Glycemic Control (2015) ? ?Target Ranges:  Prepandial:   less than 140 mg/dL ?     Peak postprandial:   less than 180 mg/dL (1-2 hours) ?     Critically ill patients:  140 - 180 mg/dL  ? ?Lab Results  ?Component Value Date  ? GLUCAP 195 (H) 12/10/2021  ? HGBA1C 14.4 (H) 08/27/2021  ? ? ?Review of Glycemic Control ? ?Diabetes history: DM1 since age 26 ?Outpatient Diabetes medications: Lantus 36 units QHS, Novolog 1:10 CHO ratio, CF 10? "Usually takes approx 20 units at meals." ?Current orders for Inpatient glycemic control: IV insulin per EndoTool for DKA ? ?HgbA1C - pending. Last one 08/27/21 - 14.4% ?PCP - [place] - hasn't been in over 6 mo. ?Monitors blood sugars at least 3x/day ? ?Note: cocaine + ? ?Inpatient Diabetes Program Recommendations:   ? ?Continue IV insulin per EndoTool until criteria met for transitioning to SQ insulin. Give Semglee 1-2 H prior to discontinuation of drip. ? ?Semglee 18 units BID ?Novolog 0-9 units Q4H x 12H, then TID with meals and 0-5 HS ?Novolog 4 units TID with meals (will likely need titration) ? ?Spoke with pt at bedside regarding his DKA admission. Pt states he started feeling bad over the weekend, but did not miss his insulin. Goes to [facility] for diabetes management, but hasn't been "in a long time." Said his CHO ratio is 1:10 although didn't know Correction Factor (how much 1 unit of insulin brings down blood sugar) Will need to f/u with [facility] for appt. No issues in getting his meds. ? ?Will f/u with pt when transitioning off insulin drip. ? ?Discussed above with RN. ? ?Thank you. ?Lorenda Peck, RD, LDN, CDE ?Inpatient Diabetes Coordinator ?[phone]  ? ? ? ? ? ? ? ? ?

## 2021-12-10 NOTE — Assessment & Plan Note (Addendum)
Chest x-ray without evidence of intrathoracic process such as pneumonia or pleural effusion. ?Urinalysis without evidence of acute urinary tract infection. ?No obvious bruises or open wounds. No other sources of infection. Does not have any lower extremity peripheral extremity swelling. ? ? ?

## 2021-12-10 NOTE — H&P (Signed)
?History and Physical  ? ? ?PLEASE NOTE THAT [employer]AGON [employer] SOFTWARE WAS USED IN THE CONSTRUCTION OF THIS NOTE. ? ? ?Joseph Barr DOB: Jan 05, 1996 DOA: 12/09/2021 ? ?PCP: [name], NP  ?Patient coming from: home  ? ?I have personally briefly reviewed patient's old medical records in [place] ? ?Chief Complaint: Nausea and vomiting ? ?HPI: Joseph Barr is a 26 y.o. male with medical history significant for type 1 diabetes mellitus with most recent hemoglobin A1c 14.4% in December 2022, GERD, who is admitted to [facility] on 12/09/2021 with DKA after presenting from home to [facility] ED complaining of nausea and vomiting.  ? ?The patient reports to 3 days intermittent nausea 3-4 daily episodes of nonbloody, nonbilious emesis, with most recent such episode of vomiting occurring in the was in the emergency department this evening.  In the context of this intermittent nausea/vomiting, he reports significant decline in oral intake of both food and water over the last few days.  He also notes mild, sharp, nonradiating epigastric discomfort, that started after the first several episodes of vomiting.  Not associate with any diarrhea, melena, hematochezia, or rash.  He also denies any recent dysuria or gross hematuria. ? ?Denies any associated subjective fever, chills, rigors, or generalized myalgias.  No recent headache, neck stiffness, sore throat, shortness of breath, wheezing, cough. No chest pain, diaphoresis, palpitations, dizziness.  No recent alcohol consumption.  He acknowledges intermittent use of marijuana. ? ? ?Denies any associated acute focal neurologic deficits, including no acute focal weakness, acute focal numbness, paresthesias, facial droop, dysarthria, expressive aphasia, acute change in vision, acute change in hearing, dysphagia, vertigo. ? ?He acknowledges a history of type 1 diabetes mellitus, and reports good compliance with his outpatient basal and short acting  insulin regimen, including Lantus 34 units subcu daily as well as sliding scale NovoLog 3 times daily with meals.  Denies any recent missed doses of either his basal or short acting insulin.  Most recent hemoglobin A 1C, per chart review, noted to be 14.4% in December 2022. ? ? ? ?ED Course:  ?Vital signs in the ED were notable for the following: Afebrile; initial heart rate 132, subsequently trending down to 124 following interval initiation of IV fluids, as further detailed below; initial blood pressure 102/82, subsequent trended up to 121/87 following interval IV fluids; respiratory rate 16-18, oxygen saturation 99 to 100% on room air. ? ?Labs were notable for the following: Initial CBG 576; VBG showed 7.15/38; beta hydroxybutyric acid greater than 8.  CMP notable for sodium 130, which corrects to approximately 136 when taking into account concomitant hyperglycemia potassium 4.4, bicarbonate 10, anion gap 27, creatinine 1.32 relative to most recent prior serum creatinine data point of 0.84 on 08/30/2021, glucose 501, total bilirubin 1.7, otherwise, liver enzymes within normal limits.  Lipase 22.  CBC notable for white cell count 13,800.  Urinalysis showed no white blood cells, no bacteria, leukocyte esterase/nitrate negative, 80 ketones and 30 protein.  EKG showed sinus tachycardia with without evidence of acute ischemic changes. ? ?While in the ED, the following were administered: Zofran 4 mg IV x1, insulin drip, lactated ringer bolus x1360 mL followed by initiation of continuous LR at 125 cc/h; potassium chloride 20 equivalents IV over 2 hours. ? ?Subsequently, the patient was admitted for overnight observation for further evaluation management of DKA in setting of presenting nausea/vomiting, with presenting labs also notable for mild acute kidney injury as well as leukocytosis. ? ? ? ? ?  Review of Systems: As per HPI otherwise 10 point review of systems negative.  ? ?Past Medical History:  ?Diagnosis Date  ?  Diabetes mellitus   ? ? ?History reviewed. No pertinent surgical history. ? ?Social History: ? reports that he has quit smoking. His smoking use included e-cigarettes. He has quit using smokeless tobacco. He reports current drug use. Drug: Marijuana. He reports that he does not drink alcohol. ? ? ?No Known Allergies ? ?Family History  ?Problem Relation Age of Onset  ? Cancer Maternal Grandfather   ? Hypertension Mother   ? Diabetes Neg Hx   ? ? ?Family history reviewed and not pertinent  ? ? ?Prior to Admission medications   ?Medication Sig Start Date End Date Taking? Authorizing Provider  ?acetaminophen (TYLENOL) 325 MG tablet Take 2 tablets (650 mg total) by mouth every 6 (six) hours as needed for mild pain (or Fever >/= 101). 08/30/21   Idamae Schuller, MD  ?Blood Glucose Monitoring Suppl (TRUE METRIX METER) w/Device KIT Use as directed 12/21/20   Camillia Herter, NP  ?glucose blood (ACCU-CHEK AVIVA) test strip 1 each by Other route 4 (four) times daily. And lancets 250.03 06/26/14   Renato Shin, MD  ?glucose blood (TRUE METRIX BLOOD GLUCOSE TEST) test strip Use as instructed 01/08/21   Camillia Herter, NP  ?guaiFENesin-dextromethorphan (ROBITUSSIN DM) 100-10 MG/5ML syrup Take 10 mLs by mouth every 4 (four) hours as needed for cough. 08/30/21   Idamae Schuller, MD  ?insulin aspart (NOVOLOG FLEXPEN) 100 UNIT/ML FlexPen Inject 12-20 Units into the skin 3 (three) times daily with meals. Sliding scale ?Patient taking differently: Inject 12-20 Units into the skin 3 (three) times daily with meals. Sliding scale : 1-10 Ratio 12/21/20   Camillia Herter, NP  ?insulin glargine (LANTUS SOLOSTAR) 100 UNIT/ML Solostar Pen Inject 36 Units into the skin daily. ?Patient taking differently: Inject 34 Units into the skin daily. 07/05/21 01/05/22  Camillia Herter, NP  ?Insulin Pen Needle 31G X 5 MM MISC Use as directed in the morning, at noon, and at bedtime. 07/05/21   [name], NP  ?menthol-cetylpyridinium (CEPACOL) 3 MG lozenge  Take 1 lozenge (3 mg total) by mouth every 4 (four) hours as needed for sore throat. 08/30/21   Idamae Schuller, MD  ?pantoprazole (PROTONIX) 40 MG tablet Take 1 tablet (40 mg total) by mouth daily. 08/31/21 09/30/21  Idamae Schuller, MD  ?TRUEplus Lancets 28G MISC Use as directed 01/08/21   Camillia Herter, NP  ? ? ? ?Objective  ? ? ?Physical Exam: ?Vitals:  ? 12/09/21 2245 12/10/21 0000 12/10/21 0052 12/10/21 0100  ?BP: 117/87 105/72 115/80 121/87  ?Pulse: (!) 125 (!) 133 (!) 132 (!) 132  ?Resp: _0 ?Temp:   98.2 ?F (36.8 ?C)   ?TempSrc:   Oral   ?SpO2: 99% 99% 100% 100%  ?Weight:   56.4 kg   ?Height:   6' (1.829 m)   ? ? ?General: appears to be stated age; alert, oriented ?Skin: warm, dry, no rash ?Head:  AT/[place] ?Mouth:  Oral mucosa membranes appear dry, normal dentition ?Neck: supple; trachea midline ?Heart:  RRR; did not appreciate any M/R/G ?Lungs: CTAB, did not appreciate any wheezes, rales, or rhonchi ?Abdomen: + BS; soft, ND, mild epigastric discomfort with palpation, but in the absence of any associated guarding, rigidity, or rebound tenderness. ?Vascular: 2+ pedal pulses b/l; 2+ radial pulses b/l ?Extremities: no peripheral edema, no muscle wasting ?Neuro: strength and sensation  intact in upper and lower extremities b/l ? ? ? ? ? ?Labs on Admission: I have personally reviewed following labs and imaging studies ? ?CBC: ?Recent Labs  ?Lab 12/09/21 ?2216 12/09/21 ?2243  ?WBC 13.8*  --   ?HGB 15.4 18.0*  ?HCT 45.1 53.0*  ?MCV 87.9  --   ?PLT 394  --   ? ?Basic Metabolic Panel: ?Recent Labs  ?Lab 12/09/21 ?2216 12/09/21 ?2243  ?NA 130* 127*  ?K 4.4 4.7  ?CL 93* 93*  ?CO2 10*  --   ?GLUCOSE 507* 601*  ?BUN 28* 33*  ?CREATININE 1.32* 1.10  ?CALCIUM 8.6*  --   ? ?GFR: ?Estimated Creatinine Clearance: 81.2 mL/min (by C-G formula based on SCr of 1.1 mg/dL). ?Liver Function Tests: ?Recent Labs  ?Lab 12/09/21 ?2216  ?AST 18  ?ALT 18  ?ALKPHOS 91  ?BILITOT 1.7*  ?PROT 7.5  ?ALBUMIN 3.8  ? ?Recent Labs  ?Lab  12/09/21 ?2216  ?LIPASE 22  ? ?No results for input(s): AMMONIA in the last 168 hours. ?Coagulation Profile: ?No results for input(s): INR, PROTIME in the last 168 hours. ?Cardiac Enzymes: ?No results for input(s): CKTOT

## 2021-12-11 LAB — HEMOGLOBIN A1C
Hgb A1c MFr Bld: >15.5 % — ABNORMAL HIGH (ref 4.8–5.6)
Mean Plasma Glucose: >398 mg/dL

## 2021-12-11 LAB — BASIC METABOLIC PANEL
Anion gap: 7 (ref 5–15)
Anion gap: 5 (ref 5–15)
Anion gap: 5 (ref 5–15)
BUN: 14 mg/dL (ref 6–20)
BUN: 11 mg/dL (ref 6–20)
BUN: 9 mg/dL (ref 6–20)
CO2: 26 mmol/L (ref 22–32)
CO2: 27 mmol/L (ref 22–32)
CO2: 26 mmol/L (ref 22–32)
Calcium: 8.8 mg/dL — ABNORMAL LOW (ref 8.9–10.3)
Calcium: 8.8 mg/dL — ABNORMAL LOW (ref 8.9–10.3)
Calcium: 8.6 mg/dL — ABNORMAL LOW (ref 8.9–10.3)
Chloride: 101 mmol/L (ref 98–111)
Chloride: 100 mmol/L (ref 98–111)
Chloride: 101 mmol/L (ref 98–111)
Creatinine, Ser: 0.82 mg/dL (ref 0.61–1.24)
Creatinine, Ser: 0.76 mg/dL (ref 0.61–1.24)
Creatinine, Ser: 0.6 mg/dL — ABNORMAL LOW (ref 0.61–1.24)
GFR, Estimated: >60 mL/min (ref >60)
GFR, Estimated: >60 mL/min (ref >60)
GFR, Estimated: >60 mL/min (ref >60)
Glucose, Bld: 148 mg/dL — ABNORMAL HIGH (ref 70–99)
Glucose, Bld: 159 mg/dL — ABNORMAL HIGH (ref 70–99)
Glucose, Bld: 142 mg/dL — ABNORMAL HIGH (ref 70–99)
Potassium: 3.5 mmol/L (ref 3.5–5.1)
Potassium: 3.3 mmol/L — ABNORMAL LOW (ref 3.5–5.1)
Potassium: 3.4 mmol/L — ABNORMAL LOW (ref 3.5–5.1)
Sodium: 134 mmol/L — ABNORMAL LOW (ref 135–145)
Sodium: 132 mmol/L — ABNORMAL LOW (ref 135–145)
Sodium: 132 mmol/L — ABNORMAL LOW (ref 135–145)

## 2021-12-11 LAB — RENAL FUNCTION PANEL
Albumin: 3.2 g/dL — ABNORMAL LOW (ref 3.5–5.0)
Anion gap: 5 (ref 5–15)
BUN: 10 mg/dL (ref 6–20)
CO2: 26 mmol/L (ref 22–32)
Calcium: 8.5 mg/dL — ABNORMAL LOW (ref 8.9–10.3)
Chloride: 100 mmol/L (ref 98–111)
Creatinine, Ser: 0.68 mg/dL (ref 0.61–1.24)
GFR, Estimated: >60 mL/min (ref >60)
Glucose, Bld: 265 mg/dL — ABNORMAL HIGH (ref 70–99)
Phosphorus: 2.2 mg/dL — ABNORMAL LOW (ref 2.5–4.6)
Potassium: 3.7 mmol/L (ref 3.5–5.1)
Sodium: 131 mmol/L — ABNORMAL LOW (ref 135–145)

## 2021-12-11 LAB — GLUCOSE, CAPILLARY
Glucose-Capillary: 114 mg/dL — ABNORMAL HIGH (ref 70–99)
Glucose-Capillary: 124 mg/dL — ABNORMAL HIGH (ref 70–99)
Glucose-Capillary: 135 mg/dL — ABNORMAL HIGH (ref 70–99)
Glucose-Capillary: 140 mg/dL — ABNORMAL HIGH (ref 70–99)
Glucose-Capillary: 144 mg/dL — ABNORMAL HIGH (ref 70–99)
Glucose-Capillary: 149 mg/dL — ABNORMAL HIGH (ref 70–99)
Glucose-Capillary: 149 mg/dL — ABNORMAL HIGH (ref 70–99)
Glucose-Capillary: 164 mg/dL — ABNORMAL HIGH (ref 70–99)
Glucose-Capillary: 171 mg/dL — ABNORMAL HIGH (ref 70–99)
Glucose-Capillary: 175 mg/dL — ABNORMAL HIGH (ref 70–99)
Glucose-Capillary: 199 mg/dL — ABNORMAL HIGH (ref 70–99)
Glucose-Capillary: 205 mg/dL — ABNORMAL HIGH (ref 70–99)
Glucose-Capillary: 214 mg/dL — ABNORMAL HIGH (ref 70–99)
Glucose-Capillary: 232 mg/dL — ABNORMAL HIGH (ref 70–99)

## 2021-12-11 LAB — BETA-HYDROXYBUTYRIC ACID
Beta-Hydroxybutyric Acid: 0.87 mmol/L — ABNORMAL HIGH (ref 0.05–0.27)
Beta-Hydroxybutyric Acid: 0.16 mmol/L (ref 0.05–0.27)

## 2021-12-11 LAB — MAGNESIUM: Magnesium: 1.9 mg/dL (ref 1.7–2.4)

## 2021-12-11 MED ORDER — INSULIN GLARGINE-YFGN 100 UNIT/ML ~~LOC~~ SOLN
18.0000 [IU] | Freq: Two times a day (BID) | SUBCUTANEOUS | Status: AC
  Administered 2021-12-11: 18 [IU] via SUBCUTANEOUS
  Filled 2021-12-11: qty 0.18

## 2021-12-11 MED ORDER — INSULIN ASPART 100 UNIT/ML IJ SOLN
0.0000 [IU] | Freq: Every day | INTRAMUSCULAR | Status: DC
  Administered 2021-12-12: 2 [IU] via SUBCUTANEOUS

## 2021-12-11 MED ORDER — INSULIN GLARGINE-YFGN 100 UNIT/ML ~~LOC~~ SOLN
18.0000 [IU] | Freq: Two times a day (BID) | SUBCUTANEOUS | Status: DC
  Administered 2021-12-11 – 2021-12-12 (×2): 18 [IU] via SUBCUTANEOUS
  Filled 2021-12-11 (×3): qty 0.18

## 2021-12-11 MED ORDER — INSULIN GLARGINE-YFGN 100 UNIT/ML ~~LOC~~ SOLN
18.0000 [IU] | Freq: Two times a day (BID) | SUBCUTANEOUS | Status: DC
  Filled 2021-12-11 (×2): qty 0.18

## 2021-12-11 MED ORDER — INSULIN ASPART 100 UNIT/ML IJ SOLN: 0.0000 [IU] | Freq: Three times a day (TID) | INTRAMUSCULAR | Status: DC

## 2021-12-11 MED ORDER — PANTOPRAZOLE SODIUM 40 MG PO TBEC
40.0000 mg | DELAYED_RELEASE_TABLET | Freq: Every day | ORAL | Status: DC
  Administered 2021-12-11: 40 mg via ORAL
  Filled 2021-12-11: qty 1

## 2021-12-11 MED ORDER — INSULIN ASPART 100 UNIT/ML IJ SOLN: 0.0000 [IU] | INTRAMUSCULAR | Status: DC

## 2021-12-11 MED ORDER — POTASSIUM CHLORIDE 10 MEQ/100ML IV SOLN
10.0000 meq | INTRAVENOUS | Status: DC
  Administered 2021-12-11: 10 meq via INTRAVENOUS
  Filled 2021-12-11: qty 100

## 2021-12-11 MED ORDER — INSULIN ASPART 100 UNIT/ML IJ SOLN
0.0000 [IU] | INTRAMUSCULAR | Status: AC
  Administered 2021-12-11 (×2): 3 [IU] via SUBCUTANEOUS
  Administered 2021-12-11: 1 [IU] via SUBCUTANEOUS

## 2021-12-11 MED ORDER — POTASSIUM & SODIUM PHOSPHATES 280-160-250 MG PO PACK
1.0000 | PACK | Freq: Three times a day (TID) | ORAL | Status: AC
  Administered 2021-12-11 – 2021-12-12 (×3): 1 via ORAL
  Filled 2021-12-11 (×4): qty 1

## 2021-12-11 MED ORDER — INSULIN ASPART 100 UNIT/ML IJ SOLN
5.0000 [IU] | Freq: Three times a day (TID) | INTRAMUSCULAR | Status: DC
Start: 2021-12-11 — End: 2021-12-12
  Administered 2021-12-11 – 2021-12-12 (×4): 5 [IU] via SUBCUTANEOUS

## 2021-12-11 MED ORDER — INSULIN ASPART 100 UNIT/ML IJ SOLN
0.0000 [IU] | Freq: Three times a day (TID) | INTRAMUSCULAR | Status: DC
  Administered 2021-12-12: 1 [IU] via SUBCUTANEOUS

## 2021-12-11 NOTE — Progress Notes (Signed)
Patient transported to 1605 with all personal belongings including cell phone and clothing. Patient states that he has all his belongings and thanks staff for their care.  ?

## 2021-12-11 NOTE — Progress Notes (Signed)
?Progress Note ? ? ?Patient: Joseph Barr QMV:[id number] DOB: 09-23-1995 DOA: 12/09/2021     1 ?DOS: the patient was seen and examined on 12/11/2021 ?  ?Brief hospital course: ?26 y.o. male with medical history significant for type 1 diabetes mellitus with most recent hemoglobin A1c 14.4% in December 2022, GERD, who is admitted to [facility] on 12/09/2021 with DKA after presenting from home to [facility] ED complaining of nausea and vomiting.  ?  ?The patient reports to 3 days intermittent nausea 3-4 daily episodes of nonbloody, nonbilious emesis, with most recent such episode of vomiting occurring in the was in the emergency department this evening.  In the context of this intermittent nausea/vomiting, he reports significant decline in oral intake of both food and water over the last few days.  He also notes mild, sharp, nonradiating epigastric discomfort, that started after the first several episodes of vomiting.  Not associate with any diarrhea, melena, hematochezia, or rash.  He also denies any recent dysuria or gross hematuria. ?  ?Denies any associated subjective fever, chills, rigors, or generalized myalgias.  No recent headache, neck stiffness, sore throat, shortness of breath, wheezing, cough. No chest pain, diaphoresis, palpitations, dizziness.  No recent alcohol consumption.  He acknowledges intermittent use of marijuana. ?  ?  ?Denies any associated acute focal neurologic deficits, including no acute focal weakness, acute focal numbness, paresthesias, facial droop, dysarthria, expressive aphasia, acute change in vision, acute change in hearing, dysphagia, vertigo. ?  ?He acknowledges a history of type 1 diabetes mellitus, and reports good compliance with his outpatient basal and short acting insulin regimen, including Lantus 34 units subcu daily as well as sliding scale NovoLog 3 times daily with meals.  Denies any recent missed doses of either his basal or short acting insulin.  Most recent hemoglobin A  1C, per chart review, noted to be 14.4% in December 2022. ?  ?4/11 remains on Endotool, denies acute symptoms. No appetite. ?IVF bolus given. Denies using cocaine, but is + on UDS. Denies recent infection or illness. Denies missing medications. Somewhat guarded. ? ?4/12 low doses endotool, anticipate he will transition over today. Restart diet.  ?Work on [employer], polysubstance abuse education and likely dc within 24-48 hours. ? ? ?Assessment and Plan: ?* DKA (diabetic ketoacidosis) (HCC) ?Diabetic ketoacidosis:  ?Admission BG- 576 a/w, presenting blood gas consistent with metabolic acidosis,  while CMP demonstrates anion gap metabolic acidosis with serum bicarbonate of 10 and AG of 27, and beta hydroxybutyric acid found to be elevated. Additional presenting labs notable for corrected serum sodium of 136 and serum potassium of 4.4 ?----> Currently on Endotool transitioning off; ?- H/o T1DM, A1c of 14.4% in December 2022 ?- h/o prior hospitalizations for DKA ?- home rx: Lantus 34 units daily  ?+ SSI, NovoLog 3 times daily with meals,  ?UDS +cocaine, patient denying use or exposure. ?Continue endotool, wean as tolerated,  ?Needs DM education, appreciate Ailene Ards ? ?GERD (gastroesophageal reflux disease) ? ? ?#) GERD: documented h/o such; on Protonix as outpatient.  ? ?Plan: In setting of current n.p.o. status, will convert daily oral Protonix to IV route of administration. ? ? ? ?Leukocytosis ?Chest x-ray without evidence of intrathoracic process such as pneumonia or pleural effusion. ?Urinalysis without evidence of acute urinary tract infection. ?No obvious bruises or open wounds. No other sources of infection. Does not have any lower extremity peripheral extremity swelling. ? ? ? ?Dehydration ?#) Dehydration:  ? ?Plan: Monitor strict I's and O's.  Daily weights.  Repeat BMP in the morning. IVF's as described above.  ? ? ? ? ?AKI (acute kidney injury) (HCC) ?#) Acute Kidney Injury:  ?- Admission Cr 1.32   (0.84 in December 2022.) ?- prerenal in nature stemming from dehydration as a consequence of hyperglyemic-associated auto-diuresis, as above. UA demonstrates 30 protein, without additional urinary casts..  ?- Continue strict I&O, avoid nephrotoxic agents.  ?- Continue IVF's, giving bolus today, give tachycardia ?- Daily BMP for now ? ? ?  ? ?Subjective: ? ? ? ?Physical Exam: ?Vitals:  ? 12/11/21 0400 12/11/21 0408 12/11/21 0800 12/11/21 1000  ?BP: 123/85  97/62 118/69  ?Pulse: 96 98 97 96  ?Resp: 18 13 17  (!) 21  ?Temp:   98.8 ?F (37.1 ?C)   ?TempSrc:   Oral   ?SpO2: 99% 100% 98% 99%  ?Weight:  63.3 kg    ?Height:      ? ?General: appears to be stated age; alert, oriented ?Skin: warm, dry, no rash ?Head:  AT/[place] ?Mouth:  Oral mucosa membranes appear dry, normal dentition ?Neck: supple; trachea midline ?Heart:  RRR; did not appreciate any M/R/G ?Lungs: CTAB, did not appreciate any wheezes, rales, or rhonchi ?Abdomen: + BS; soft, ND, mild epigastric discomfort with palpation, but in the absence of any associated guarding, rigidity, or rebound tenderness. ?Vascular: 2+ pedal pulses b/l; 2+ radial pulses b/l ?Extremities: no peripheral edema, no muscle wasting ?Neuro: strength and sensation intact in upper and lower extremities b/l ?  ?Data Reviewed: ?Reviewed the lab work from today given the recommendations and put in orders as needed. ? ?Disposition: ?Status is: Observation ?The patient will require care spanning > 2 midnights and should be moved to inpatient because:  DKA management.  ? Planned Discharge Destination: Home ? ? ? ?Time spent: 35 minutes ? ?Author: ? , MD ?12/11/2021 11:01 AM ? ?For on call review [email or web address].  ?

## 2021-12-11 NOTE — Progress Notes (Signed)
Inpatient Diabetes Program Recommendations ? ?AACE/ADA: New Consensus Statement on Inpatient Glycemic Control (2015) ? ?Target Ranges:  Prepandial:   less than 140 mg/dL ?     Peak postprandial:   less than 180 mg/dL (1-2 hours) ?     Critically ill patients:  140 - 180 mg/dL  ? ?Lab Results  ?Component Value Date  ? GLUCAP 114 (H) 12/11/2021  ? HGBA1C 14.4 (H) 08/27/2021  ? ? ?Review of Glycemic Control ? ?Diabetes history: DM1 ?Outpatient Diabetes medications: Lantus 34 units QD, Novolog 1:10 CHO ratio ?Current orders for Inpatient glycemic control: Semglee 18 units BID, Novolog 0-9 units Q4, then TID with meals and 0-5 HS + 5 units TID ? ?Inpatient Diabetes Program Recommendations:   ? ?Semglee 18 BID ?Novolog 0-9 Q4H x 12H then TID with meals and 0-5 HS + 5 units TID. ? ?Spoke with pt at bedside, looking forward to eating.  ? ?Will continue to follow.  ? ?Thank you. ?Ailene Ards, RD, LDN, CDE ?Inpatient Diabetes Coordinator ?[phone]  ? ? ? ? ?

## 2021-12-11 NOTE — TOC Initial Note (Signed)
Transition of Care (TOC) - Initial/Assessment Note  ? ? ?Patient Details  ?Name: Joseph Barr ?MRN: [id number] ?Date of Birth: [birth year] ? ?Transition of Care (TOC) CM/SW Contact:    ?Cecille Po, RN ?Phone Number: ?12/11/2021, 11:11 AM ? ?Clinical Narrative:                 ? ?Per notes, patient [facility], but hasn't been seen in a while.   ?Called the [facility] to schedule a hospital follow up appt for patient and spoke with Tiffany.  They are booked out until May 15, and per their new policy, Elmarie Shiley will send a message to the RN, and the RN will contact patient to schedule a hospital follow up.   ? ? ? ?Expected Discharge Plan: Home/Self Care ?Barriers to Discharge: Continued Medical Work up ? ? ?Expected Discharge Plan and Services ?Expected Discharge Plan: Home/Self Care ?  ?   ?Living arrangements for the past 2 months: Apartment ?                ? Prior Living Arrangements/Services ?Living arrangements for the past 2 months: Apartment ?Lives with:: Relatives ?Patient language and need for interpreter reviewed:: Yes ?       ?Need for Family Participation in Patient Care: No (Comment) ?Care giver support system in place?: Yes (comment) ?  ?Criminal Activity/Legal Involvement Pertinent to Current Situation/Hospitalization: No - Comment as needed ? ?Activities of Daily Living ?Home Assistive Devices/Equipment: CBG Meter ?ADL Screening (condition at time of admission) ?Patient's cognitive ability adequate to safely complete daily activities?: Yes ?Is the patient deaf or have difficulty hearing?: No ?Does the patient have difficulty seeing, even when wearing glasses/contacts?: No ?Does the patient have difficulty concentrating, remembering, or making decisions?: No ?Patient able to express need for assistance with ADLs?: Yes ?Does the patient have difficulty dressing or bathing?: No ?Independently performs ADLs?: Yes (appropriate for  developmental age) ?Does the patient have difficulty walking or climbing stairs?: No ?Weakness of Legs: Both ?Weakness of Arms/Hands: Both ? ?Emotional Assessment ?  ?Orientation: : Oriented to Self, Oriented to Place, Oriented to Situation, Oriented to  Time ?Alcohol / Substance Use: Not Applicable ?Psych Involvement: No (comment) ? ?Admission diagnosis:  DKA (diabetic ketoacidosis) (HCC) [E11.10] ?Type 1 diabetes mellitus with ketoacidosis without coma (HCC) [E10.10] ?Patient Active Problem List  ? Diagnosis Date Noted  ? Dehydration 12/10/2021  ? Leukocytosis 12/10/2021  ? GERD (gastroesophageal reflux disease) 12/10/2021  ? Malnutrition of moderate degree 12/10/2021  ? DKA (diabetic ketoacidosis) (HCC) 12/09/2021  ? Influenza A 08/28/2021  ? Sepsis with acute renal failure without septic shock (HCC)   ? Pneumonia of right lower lobe due to infectious organism   ? Intractable nausea and vomiting 08/27/2021  ? Cellulitis 06/06/2021  ? Pseudohyponatremia 12/03/2020  ? Hypochloremia 12/03/2020  ? SIRS (systemic inflammatory response syndrome) (HCC) 11/19/2019  ? Renal insufficiency 11/19/2019  ? Hyperkalemia 06/11/2017  ? Tobacco abuse 06/11/2017  ? Chest pain 06/11/2017  ? Increased anion gap metabolic acidosis 06/11/2017  ? Diabetic ketoacidosis without coma associated with type 1 diabetes mellitus (HCC) 04/24/2015  ? DKA, type 1 (HCC) 04/24/2015  ? AKI (acute kidney injury) (HCC) 04/24/2015  ? Nausea with vomiting 12/06/2014  ? Uncontrolled diabetes mellitus with hyperglycemia (HCC) 01/21/2012  ? Diabetes mellitus type 1 (HCC) 01/06/2012  ? Hyperglycemia 01/06/2012  ? Ketonuria 01/06/2012  ? Headache(784.0) 01/06/2012  ? ?PCP:  Bertram Denver  W, NP ?Pharmacy:   ?[facility] - [place] [place] - [address] ?[address] ?[place] [address] ?Phone: [phone] Fax: [phone] ? ?[facility] ?[address], [address] ?[place] [address] ?Phone:  [phone] Fax: [phone] ? ? ?Readmission Risk Interventions ? ?  11/21/2019  ?  2:23 PM  ?Readmission Risk Prevention Plan  ?Post Dischage Appt Not Complete  ?Appt Comments continue to work to re-establish pt at [facility] vs alternate health clinic  ?Medication Screening Complete  ?Transportation Screening Complete  ? ? ? ?

## 2021-12-11 NOTE — Progress Notes (Addendum)
Nurse reached out to provider Fayrene Fearing, NP in reference to patient's labs and diet. Patient's diet discontinued in an effort to stabilize labs. Will continue to monitor.  ?

## 2021-12-12 ENCOUNTER — Other Ambulatory Visit: Payer: Self-pay

## 2021-12-12 LAB — GLUCOSE, CAPILLARY
Glucose-Capillary: 110 mg/dL — ABNORMAL HIGH (ref 70–99)
Glucose-Capillary: 132 mg/dL — ABNORMAL HIGH (ref 70–99)
Glucose-Capillary: 96 mg/dL (ref 70–99)

## 2021-12-12 MED ORDER — INSULIN GLARGINE 100 UNIT/ML SOLOSTAR PEN
18.0000 [IU] | PEN_INJECTOR | Freq: Two times a day (BID) | SUBCUTANEOUS | 1 refills | Status: DC
  Filled 2021-12-12 (×2): qty 12, 33d supply, fill #0
  Filled 2022-03-03: qty 12, 33d supply, fill #1

## 2021-12-12 MED ORDER — INSULIN LISPRO 100 UNIT/ML IJ SOLN
5.0000 [IU] | Freq: Three times a day (TID) | INTRAMUSCULAR | 11 refills | Status: DC
  Filled 2021-12-12 (×2): qty 10, 28d supply, fill #0
  Filled 2022-03-03: qty 10, 28d supply, fill #1

## 2021-12-12 MED ORDER — PANTOPRAZOLE SODIUM 40 MG PO TBEC
40.0000 mg | DELAYED_RELEASE_TABLET | Freq: Every day | ORAL | 0 refills | Status: DC
  Filled 2021-12-12: qty 30, 30d supply, fill #0

## 2021-12-12 MED ORDER — POTASSIUM & SODIUM PHOSPHATES 280-160-250 MG PO PACK
1.0000 | PACK | Freq: Once | ORAL | Status: AC
  Administered 2021-12-12: 1 via ORAL
  Filled 2021-12-12: qty 1

## 2021-12-12 NOTE — Discharge Summary (Addendum)
?Physician Discharge Summary ?  ?Patient: Joseph Barr MRN: [id number] DOB: 04-Dec-1995  ?Admit date:     12/09/2021  ?Discharge date: 12/12/21  ?Discharge Physician: Vanna Scotland  ? ?PCP: [name], NP  ? ?Recommendations at discharge:  ?Cocaine abuse avoidance discussed ?PCP and endocrinology established care needed ?Understands how to manage critically high Bgs at home.  ? ?Discharge Diagnoses: ?Principal Problem: ?  DKA (diabetic ketoacidosis) ([place]) ?Active Problems: ?  AKI (acute kidney injury) ([place]) ?  Dehydration ?  Leukocytosis ?  GERD (gastroesophageal reflux disease) ?  Malnutrition of moderate degree ? ?Resolved Problems: ?  * No resolved hospital problems. * ? ?Hospital Course: ?26 y.o. male with medical history significant for type 1 diabetes mellitus with most recent hemoglobin A1c 14.4% in December 2022, GERD, who is admitted to [facility] on 12/09/2021 with DKA after presenting from home to [facility] ED complaining of nausea and vomiting.  ?  ?The patient reports to 3 days intermittent nausea 3-4 daily episodes of nonbloody, nonbilious emesis, with most recent such episode of vomiting occurring in the was in the emergency department this evening.  In the context of this intermittent nausea/vomiting, he reports significant decline in oral intake of both food and water over the last few days.  He also notes mild, sharp, nonradiating epigastric discomfort, that started after the first several episodes of vomiting.  Not associate with any diarrhea, melena, hematochezia, or rash.  He also denies any recent dysuria or gross hematuria. ?  ?Denies any associated subjective fever, chills, rigors, or generalized myalgias.  No recent headache, neck stiffness, sore throat, shortness of breath, wheezing, cough. No chest pain, diaphoresis, palpitations, dizziness.  No recent alcohol consumption.  He acknowledges intermittent use of marijuana. ?  ?  ?Denies any associated acute focal neurologic deficits,  including no acute focal weakness, acute focal numbness, paresthesias, facial droop, dysarthria, expressive aphasia, acute change in vision, acute change in hearing, dysphagia, vertigo. ?  ?He acknowledges a history of type 1 diabetes mellitus, and reports good compliance with his outpatient basal and short acting insulin regimen, including Lantus 34 units subcu daily as well as sliding scale NovoLog 3 times daily with meals.  Denies any recent missed doses of either his basal or short acting insulin.  Most recent hemoglobin A 1C, per chart review, noted to be 14.4% in December 2022. ?  ?4/11 remains on Endotool, denies acute symptoms. No appetite. ?IVF bolus given. Denies using cocaine, but is + on UDS. Denies recent infection or illness. Denies missing medications. Somewhat guarded. ? ?4/12 low doses endotool, anticipate he will transition over today. Restart diet.  ?Work on [employer], polysubstance abuse education and likely dc within 24-48 hours. ? ? ?Assessment and Plan: ?* DKA (diabetic ketoacidosis) ([place]) ?Diabetic ketoacidosis:  ?Admission BG- 576 a/w, presenting blood gas consistent with metabolic acidosis,  while CMP demonstrates anion gap metabolic acidosis with serum bicarbonate of 10 and AG of 27, and beta hydroxybutyric acid found to be elevated. Additional presenting labs notable for corrected serum sodium of 136 and serum potassium of 4.4  ?- H/o T1DM, A1c of 14.4% in December 2022 ?- h/o prior hospitalizations for DKA   ?UDS +cocaine, patient denying use or exposure. ?Needs DM education, appreciate Lorenda Peck ?HOME REGIMEN: ?Semglee 18 BID ?Novolog 0-9 Q4H x 12H then TID with meals and 0-5 HS + 5 units TID. ? ?GERD (gastroesophageal reflux disease) ?PPI at home ? ?AKI (acute kidney injury) ([place]) ?- Admission Cr  1.32  (0.84 in December 2022.) ?- prerenal in nature stemming from dehydration as a consequence of hyperglyemic-associated auto-diuresis, as above. UA demonstrates 30 protein, without  additional urinary casts..  ? ?  ? ?Pain control - [place] Controlled Substance Reporting System database was reviewed. and patient was instructed, not to drive, operate heavy machinery, perform activities at heights, swimming or participation in water activities or provide baby-sitting services while on Pain, Sleep and Anxiety Medications; until their outpatient Physician has advised to do so again. Also recommended to not to take more than prescribed Pain, Sleep and Anxiety Medications.  ?Consultants:  n/a ?Procedures performed: n/a  ?Disposition: Home ?Diet recommendation:  ?Discharge Diet Orders (From admission, onward)  ? ?  Start     Ordered  ? 12/12/21 0000  Diet - low sodium heart healthy       ? 12/12/21 1227  ? ?  ?  ? ?  ? ?Cardiac and Carb modified diet ?DISCHARGE MEDICATION: ?Allergies as of 12/12/2021   ?No Known Allergies ?  ? ?  ?Medication List  ?  ? ?STOP taking these medications   ? ?acetaminophen 500 MG tablet ?Commonly known as: TYLENOL ?  ?guaiFENesin-dextromethorphan 100-10 MG/5ML syrup ?Commonly known as: ROBITUSSIN DM ?  ?menthol-cetylpyridinium 3 MG lozenge ?Commonly known as: CEPACOL ?  ? ?  ? ?TAKE these medications   ? ?B-D UF III MINI PEN NEEDLES 31G X 5 MM Misc ?Generic drug: Insulin Pen Needle ?Use as directed in the morning, at noon, and at bedtime. ?  ?glucose blood test strip ?Commonly known as: Accu-Chek Aviva ?1 each by Other route 4 (four) times daily. And lancets 250.03 ?  ?True Metrix Blood Glucose Test test strip ?Generic drug: glucose blood ?Use as instructed ?  ?Lantus SoloStar 100 UNIT/ML Solostar Pen ?Generic drug: insulin glargine ?Inject 18 Units into the skin 2 (two) times daily. ?What changed:  ?how much to take ?when to take this ?  ?NovoLOG FlexPen 100 UNIT/ML FlexPen ?Generic drug: insulin aspart ?Inject 12-20 Units into the skin 3 (three) times daily with meals. Sliding scale ?What changed:  ?how much to take ?when to take this ?additional instructions ?   ?insulin aspart 100 UNIT/ML injection ?Commonly known as: novoLOG ?Inject 5 Units into the skin 3 (three) times daily with meals. ?What changed: You were already taking a medication with the same name, and this prescription was added. Make sure you understand how and when to take each. ?  ?pantoprazole 40 MG tablet ?Commonly known as: PROTONIX ?Take 1 tablet (40 mg total) by mouth at bedtime. ?What changed: when to take this ?  ?True Metrix Meter w/Device Kit ?Use as directed ?  ?TRUEplus Lancets 28G Misc ?Use as directed ?  ? ?  ? ? Follow-up Information   ? ? [name], NP Follow up.   ?Specialty: Nurse Practitioner ?Contact information: ?[address] ?[place] [address] ?[phone] ? ? ?  ?  ? ? [facility] Follow up.   ?Specialty: Endocrinology ?Contact information: ?[address], [address] ?[place] [address] ?[phone] ? ?  ?  ? ?  ?  ? ?  ? ?Discharge Exam: ?Filed Weights  ? 12/10/21 0052 12/10/21 0500 12/11/21 0408  ?Weight: 56.4 kg 56.7 kg 63.3 kg  ? ? General: appears to be stated age; alert, oriented ?Skin: warm, dry, no rash ?Head:  AT/[place] ?Mouth:  Oral mucosa membranes appear dry, normal dentition ?Neck: supple; trachea midline ?Heart:  RRR; did not appreciate any M/R/G ?Lungs: CTAB, did not appreciate any wheezes, rales, or rhonchi ?Abdomen: + BS; soft, ND, mild epigastric discomfort with palpation, but in the absence of any associated guarding, rigidity, or rebound tenderness. ?Vascular: 2+ pedal pulses b/l; 2+ radial pulses b/l ?Extremities: no peripheral edema, no muscle wasting ?Neuro: strength and sensation intact in upper and lower extremities b/l ?  ? ?Condition at discharge: good ? ?The results of significant diagnostics from this hospitalization (including imaging, microbiology, ancillary and laboratory) are listed below for reference.  ? ?Imaging Studies: ?DG Chest Port 1 View ? ?Result Date: 12/10/2021 ?CLINICAL DATA:  DKA EXAM: PORTABLE  CHEST 1 VIEW COMPARISON:  08/27/2021 FINDINGS: The heart size and mediastinal contours are within normal limits. Both lungs are clear. The visualized skeletal structures are unremarkable. IMPRESSION: No active

## 2021-12-13 ENCOUNTER — Telehealth: Payer: Self-pay

## 2021-12-13 NOTE — Telephone Encounter (Signed)
Transition Care Management Unsuccessful Follow-up Telephone Call ?  ?Date of discharge and from where:  08/30/2021, [facility]  ?  ?Attempts:   1st Attempt ?  ?Reason for unsuccessful TCM follow-up call:  Voice mail full # [phone] unable to reach pt at this time. ?

## 2021-12-15 LAB — CULTURE, BLOOD (ROUTINE X 2)
Culture: NO GROWTH
Culture: NO GROWTH
Special Requests: ADEQUATE
Special Requests: ADEQUATE

## 2021-12-16 ENCOUNTER — Other Ambulatory Visit: Payer: Self-pay

## 2021-12-16 ENCOUNTER — Telehealth: Payer: Self-pay

## 2021-12-16 DIAGNOSIS — E1010 Type 1 diabetes mellitus with ketoacidosis without coma: Secondary | ICD-10-CM

## 2021-12-16 MED ORDER — INSULIN PEN NEEDLE 31G X 5 MM MISC
0 refills | Status: DC
  Filled 2021-12-16 – 2022-03-03 (×2): qty 100, 50d supply, fill #0

## 2021-12-16 MED ORDER — "INSULIN SYRINGE-NEEDLE U-100 31G X 15/64"" 0.3 ML MISC"
0 refills | Status: DC
  Filled 2021-12-16 – 2022-03-03 (×2): qty 100, 33d supply, fill #0

## 2021-12-16 NOTE — Telephone Encounter (Signed)
Transition Care Management Follow-up Telephone Call ?Date of discharge and from where: 12/12/2021, [facility] ?How have you been since you were released from the hospital? He said he is doing good, picked up his medications today.  ?Any questions or concerns? Yes - He said that he needs a new order for lantus pen needles. He was given a novolog vial instead of the pen, so he needs an order for syringes and needles. He stated that the pharmacy gave him some syringes and needles but he does not want to run out. ? ?Items Reviewed: ?Did the pt receive and understand the discharge instructions provided? Yes  ?Medications obtained and verified? Yes  - He said he has all medications and did not have any questions about the med regime. Request for supplies noted above. He has a glucometer ?Other? No  ?Any new allergies since your discharge? No  ?Dietary orders reviewed? Yes ?Do you have support at home? Yes  - his brother ? ?Home Care and Equipment/Supplies: ?Were home health services ordered? no ?If so, what is the name of the agency? N/a  ?Has the agency set up a time to come to the patient's home? not applicable ?Were any new equipment or medical supplies ordered?  No ?What is the name of the medical supply agency? N/a ?Were you able to get the supplies/equipment? not applicable ?Do you have any questions related to the use of the equipment or supplies? No ? ?Functional Questionnaire: (I = Independent and D = Dependent) ?ADLs: independent ? ?Follow up appointments reviewed: ? ?PCP Hospital f/u appt confirmed? Yes  Scheduled to see Georgian Co, PA - 01/01/2022   ?Specialist Hospital f/u appt confirmed?  None scheduled at this time    ?Are transportation arrangements needed? No  ?If their condition worsens, is the pt aware to call PCP or go to the Emergency Dept.? Yes ?Was the patient provided with contact information for the PCP's office or ED? Yes ?Was to pt encouraged to call back with questions or concerns?  Yes ? ?

## 2021-12-16 NOTE — Telephone Encounter (Signed)
Rx sent for 1 fill of needles and syringes. ?

## 2021-12-17 ENCOUNTER — Telehealth: Payer: Self-pay | Admitting: Nurse Practitioner

## 2021-12-17 ENCOUNTER — Telehealth: Payer: Self-pay | Admitting: *Deleted

## 2021-12-17 NOTE — Telephone Encounter (Signed)
Pt states brand of Lantus was changed.  Reports last night he did his usual 34u at HS, at 2am blood sugar was 32. This AM 185. ?States new order is for lantus 18U twice a day. Questioning if this is appropriate "If new brand of Lantus is stronger " and affected blood sugar drop last night. Also questioning if Humalog can be changed back to pens instead of vials, "I work outside all day and difficult to keep vials. Please advise.  ?

## 2021-12-17 NOTE — Telephone Encounter (Signed)
These changes were made by the hospital team. Pt has not been seen by our clinic since 2021. I am unable to make changes at this time. Pt has an appointment to see Marylene Land on 01/02/2022. ?

## 2021-12-17 NOTE — Telephone Encounter (Signed)
Call placed to patient and informed him that new prescriptions for syringes and needles were sent to his pharmacy. He was appreciative and said he would pick them up tomorrow . I explained that there are no refills with those orders and it is very important that he keep his upcoming appointment with Rose Phi CLung, PA next month and he said he understood and would be there. ?

## 2021-12-17 NOTE — Telephone Encounter (Unsigned)
Copied from CRM [id number]. Topic: General - Other ?>> Dec 17, 2021 12:21 PM Joseph Barr A wrote: ?Reason for CRM: The patient has additional questions related to their updated prescription for Rx #: [id number]  ?Insulin Pen Needle 31G X 5 MM MISC [[id number]]  ? ?The patient would like to discuss their concerns with the change in direction further with a member of clinical staff ? ?Please contact further when possible ?

## 2021-12-18 NOTE — Telephone Encounter (Signed)
Unable to make changes until we see the patient in clinic.  ?

## 2021-12-23 ENCOUNTER — Other Ambulatory Visit: Payer: Self-pay

## 2022-01-01 NOTE — Progress Notes (Deleted)
Patient ID: Joseph Barr, male   DOB: 1996-01-20, 26 y.o.   MRN: [id number]   After hospitalization 4/10-4/13/2023  Recommendations at discharge:  Cocaine abuse avoidance discussed PCP and endocrinology established care needed Understands how to manage critically high Bgs at home.    Discharge Diagnoses: Principal Problem:   DKA (diabetic ketoacidosis) (HCC) Active Problems:   AKI (acute kidney injury) (HCC)   Dehydration   Leukocytosis   GERD (gastroesophageal reflux disease)   Malnutrition of moderate degree   Resolved Problems:   * No resolved hospital problems. *   Hospital Course: 26 y.o. male with medical history significant for type 1 diabetes mellitus with most recent hemoglobin A1c 14.4% in December 2022, GERD, who is admitted to [facility] on 12/09/2021 with DKA after presenting from home to [facility] ED complaining of nausea and vomiting.    The patient reports to 3 days intermittent nausea 3-4 daily episodes of nonbloody, nonbilious emesis, with most recent such episode of vomiting occurring in the was in the emergency department this evening.  In the context of this intermittent nausea/vomiting, he reports significant decline in oral intake of both food and water over the last few days.  He also notes mild, sharp, nonradiating epigastric discomfort, that started after the first several episodes of vomiting.  Not associate with any diarrhea, melena, hematochezia, or rash.  He also denies any recent dysuria or gross hematuria.   Denies any associated subjective fever, chills, rigors, or generalized myalgias.  No recent headache, neck stiffness, sore throat, shortness of breath, wheezing, cough. No chest pain, diaphoresis, palpitations, dizziness.  No recent alcohol consumption.  He acknowledges intermittent use of marijuana.     Denies any associated acute focal neurologic deficits, including no acute focal weakness, acute focal numbness, paresthesias, facial droop,  dysarthria, expressive aphasia, acute change in vision, acute change in hearing, dysphagia, vertigo.   He acknowledges a history of type 1 diabetes mellitus, and reports good compliance with his outpatient basal and short acting insulin regimen, including Lantus 34 units subcu daily as well as sliding scale NovoLog 3 times daily with meals.  Denies any recent missed doses of either his basal or short acting insulin.  Most recent hemoglobin A 1C, per chart review, noted to be 14.4% in December 2022.   4/11 remains on Endotool, denies acute symptoms. No appetite. IVF bolus given. Denies using cocaine, but is + on UDS. Denies recent infection or illness. Denies missing medications. Somewhat guarded.   4/12 low doses endotool, anticipate he will transition over today. Restart diet.  Work on [employer], polysubstance abuse education and likely dc within 24-48 hours.     Assessment and Plan: * DKA (diabetic ketoacidosis) (HCC) Diabetic ketoacidosis:  Admission BG- 576 a/w, presenting blood gas consistent with metabolic acidosis,  while CMP demonstrates anion gap metabolic acidosis with serum bicarbonate of 10 and AG of 27, and beta hydroxybutyric acid found to be elevated. Additional presenting labs notable for corrected serum sodium of 136 and serum potassium of 4.4  - H/o T1DM, A1c of 14.4% in December 2022 - h/o prior hospitalizations for DKA   UDS +cocaine, patient denying use or exposure. Needs DM education, appreciate Ail[facility] Ards HOME REGIMEN: Semglee 18 BID Novolog 0-9 Q4H x 12H then TID with meals and 0-5 HS + 5 units TID.   GERD (gastroesophageal reflux disease) PPI at home   AKI (acute kidney injury) [facility]) - Admission Cr 1.32  (0.84 in December 2022.) - prerenal in  nature stemming from dehydration as a consequence of hyperglyemic-associated auto-diuresis, as above. UA demonstrates 30 protein, without additional urinary casts..        Pain control - [place] Controlled  Substance Reporting System database was reviewed. and patient was instructed, not to drive, operate heavy machinery, perform activities at heights, swimming or participation in water activities or provide baby-sitting services while on Pain, Sleep and Anxiety Medications; until their outpatient Physician has advised to do so again. Also recommended to not to take more than prescribed Pain, Sleep and Anxiety Medications.

## 2022-01-02 ENCOUNTER — Inpatient Hospital Stay: Payer: Medicaid Other | Admitting: Physician Assistant

## 2022-01-02 DIAGNOSIS — E1010 Type 1 diabetes mellitus with ketoacidosis without coma: Secondary | ICD-10-CM

## 2022-03-03 ENCOUNTER — Other Ambulatory Visit: Payer: Self-pay

## 2022-03-04 ENCOUNTER — Other Ambulatory Visit: Payer: Self-pay

## 2022-03-04 ENCOUNTER — Observation Stay (HOSPITAL_COMMUNITY)
Admission: EM | Admit: 2022-03-04 | Discharge: 2022-03-06 | Disposition: A | Discharge status: Home / Self Care | Payer: 59 | Attending: Internal Medicine | Admitting: Internal Medicine

## 2022-03-04 DIAGNOSIS — I959 Hypotension, unspecified: Secondary | ICD-10-CM | POA: Diagnosis not present

## 2022-03-04 DIAGNOSIS — Z91148 Patient's other noncompliance with medication regimen for other reason: Secondary | ICD-10-CM

## 2022-03-04 DIAGNOSIS — E860 Dehydration: Secondary | ICD-10-CM | POA: Diagnosis present

## 2022-03-04 DIAGNOSIS — N179 Acute kidney failure, unspecified: Secondary | ICD-10-CM | POA: Diagnosis present

## 2022-03-04 DIAGNOSIS — R531 Weakness: Secondary | ICD-10-CM | POA: Diagnosis not present

## 2022-03-04 DIAGNOSIS — E1065 Type 1 diabetes mellitus with hyperglycemia: Secondary | ICD-10-CM | POA: Diagnosis present

## 2022-03-04 DIAGNOSIS — E1010 Type 1 diabetes mellitus with ketoacidosis without coma: Secondary | ICD-10-CM | POA: Diagnosis not present

## 2022-03-04 DIAGNOSIS — Z87891 Personal history of nicotine dependence: Secondary | ICD-10-CM

## 2022-03-04 DIAGNOSIS — D75838 Other thrombocytosis: Secondary | ICD-10-CM | POA: Diagnosis present

## 2022-03-04 DIAGNOSIS — Z79899 Other long term (current) drug therapy: Secondary | ICD-10-CM

## 2022-03-04 DIAGNOSIS — Z794 Long term (current) use of insulin: Secondary | ICD-10-CM

## 2022-03-04 DIAGNOSIS — R000 Tachycardia, unspecified: Secondary | ICD-10-CM | POA: Diagnosis not present

## 2022-03-04 DIAGNOSIS — R739 Hyperglycemia, unspecified: Secondary | ICD-10-CM | POA: Diagnosis not present

## 2022-03-04 DIAGNOSIS — T383X6A Underdosing of insulin and oral hypoglycemic [antidiabetic] drugs, initial encounter: Secondary | ICD-10-CM | POA: Diagnosis present

## 2022-03-04 DIAGNOSIS — K219 Gastro-esophageal reflux disease without esophagitis: Secondary | ICD-10-CM | POA: Diagnosis present

## 2022-03-04 LAB — BASIC METABOLIC PANEL
Anion gap: >20 — ABNORMAL HIGH (ref 5–15)
BUN: 30 mg/dL — ABNORMAL HIGH (ref 6–20)
CO2: 15 mmol/L — ABNORMAL LOW (ref 22–32)
Calcium: 9.4 mg/dL (ref 8.9–10.3)
Chloride: 96 mmol/L — ABNORMAL LOW (ref 98–111)
Creatinine, Ser: 2.04 mg/dL — ABNORMAL HIGH (ref 0.61–1.24)
GFR, Estimated: 45 mL/min — ABNORMAL LOW (ref >60)
Glucose, Bld: 417 mg/dL — ABNORMAL HIGH (ref 70–99)
Potassium: 3.9 mmol/L (ref 3.5–5.1)
Sodium: 133 mmol/L — ABNORMAL LOW (ref 135–145)

## 2022-03-04 LAB — HEPATIC FUNCTION PANEL
ALT: 22 U/L (ref 0–44)
AST: 20 U/L (ref 15–41)
Albumin: 4.2 g/dL (ref 3.5–5.0)
Alkaline Phosphatase: 85 U/L (ref 38–126)
Bilirubin, Direct: <0.1 mg/dL (ref 0.0–0.2)
Total Bilirubin: 0.9 mg/dL (ref 0.3–1.2)
Total Protein: 8.1 g/dL (ref 6.5–8.1)

## 2022-03-04 LAB — BLOOD GAS, VENOUS
Acid-base deficit: 5.9 mmol/L — ABNORMAL HIGH (ref 0.0–2.0)
Bicarbonate: 19.6 mmol/L — ABNORMAL LOW (ref 20.0–28.0)
O2 Saturation: 76.8 %
Patient temperature: 37
pCO2, Ven: 38 mmHg — ABNORMAL LOW (ref 44–60)
pH, Ven: 7.32 (ref 7.25–7.43)
pO2, Ven: 42 mmHg (ref 32–45)

## 2022-03-04 LAB — CBC
HCT: 43.4 % (ref 39.0–52.0)
Hemoglobin: 15.5 g/dL (ref 13.0–17.0)
MCH: 29.1 pg (ref 26.0–34.0)
MCHC: 35.7 g/dL (ref 30.0–36.0)
MCV: 81.4 fL (ref 80.0–100.0)
Platelets: 437 10*3/uL — ABNORMAL HIGH (ref 150–400)
RBC: 5.33 MIL/uL (ref 4.22–5.81)
RDW: 12.1 % (ref 11.5–15.5)
WBC: 25.8 10*3/uL — ABNORMAL HIGH (ref 4.0–10.5)
nRBC: 0 % (ref 0.0–0.2)

## 2022-03-04 LAB — CBG MONITORING, ED
Glucose-Capillary: 423 mg/dL — ABNORMAL HIGH (ref 70–99)
Glucose-Capillary: 341 mg/dL — ABNORMAL HIGH (ref 70–99)

## 2022-03-04 LAB — MAGNESIUM: Magnesium: 2.2 mg/dL (ref 1.7–2.4)

## 2022-03-04 LAB — GLUCOSE, CAPILLARY: Glucose-Capillary: 237 mg/dL — ABNORMAL HIGH (ref 70–99)

## 2022-03-04 LAB — BETA-HYDROXYBUTYRIC ACID: Beta-Hydroxybutyric Acid: 2.49 mmol/L — ABNORMAL HIGH (ref 0.05–0.27)

## 2022-03-04 LAB — LIPASE, BLOOD: Lipase: 35 U/L (ref 11–51)

## 2022-03-04 MED ORDER — ACETAMINOPHEN 325 MG PO TABS: 650.0000 mg | ORAL_TABLET | Freq: Four times a day (QID) | ORAL | Status: DC | PRN

## 2022-03-04 MED ORDER — DEXTROSE IN LACTATED RINGERS 5 % IV SOLN: INTRAVENOUS | Status: DC

## 2022-03-04 MED ORDER — PROMETHAZINE HCL 25 MG/ML IJ SOLN
12.5000 mg | Freq: Four times a day (QID) | INTRAMUSCULAR | Status: DC | PRN
  Administered 2022-03-05: 12.5 mg via INTRAVENOUS
  Filled 2022-03-04: qty 12.5

## 2022-03-04 MED ORDER — PANTOPRAZOLE SODIUM 40 MG PO TBEC
40.0000 mg | DELAYED_RELEASE_TABLET | Freq: Every day | ORAL | Status: DC
Start: 2022-03-04 — End: 2022-03-06
  Administered 2022-03-05: 40 mg via ORAL
  Filled 2022-03-04: qty 1

## 2022-03-04 MED ORDER — DEXTROSE 50 % IV SOLN
0.0000 mL | INTRAVENOUS | Status: DC | PRN
  Administered 2022-03-05: 25 mL via INTRAVENOUS

## 2022-03-04 MED ORDER — POTASSIUM CHLORIDE 10 MEQ/100ML IV SOLN
10.0000 meq | INTRAVENOUS | Status: AC
  Administered 2022-03-04 (×2): 10 meq via INTRAVENOUS
  Filled 2022-03-04 (×2): qty 100

## 2022-03-04 MED ORDER — ONDANSETRON HCL 4 MG/2ML IJ SOLN
4.0000 mg | Freq: Four times a day (QID) | INTRAMUSCULAR | Status: DC | PRN
  Administered 2022-03-04: 4 mg via INTRAVENOUS
  Filled 2022-03-04: qty 2

## 2022-03-04 MED ORDER — INSULIN REGULAR(HUMAN) IN NACL 100-0.9 UT/100ML-% IV SOLN
INTRAVENOUS | Status: DC
  Administered 2022-03-04: 7 [IU]/h via INTRAVENOUS
  Filled 2022-03-04: qty 100

## 2022-03-04 MED ORDER — OXYCODONE HCL 5 MG PO TABS: 5.0000 mg | ORAL_TABLET | Freq: Four times a day (QID) | ORAL | Status: DC | PRN

## 2022-03-04 MED ORDER — POLYETHYLENE GLYCOL 3350 17 G PO PACK: 17.0000 g | PACK | Freq: Every day | ORAL | Status: DC | PRN

## 2022-03-04 MED ORDER — LACTATED RINGERS IV BOLUS
20.0000 mL/kg | Freq: Once | INTRAVENOUS | Status: AC
  Administered 2022-03-05: 1316 mL via INTRAVENOUS

## 2022-03-04 MED ORDER — MELATONIN 5 MG PO TABS: 5.0000 mg | ORAL_TABLET | Freq: Every evening | ORAL | Status: DC | PRN

## 2022-03-04 MED ORDER — LACTATED RINGERS IV SOLN: INTRAVENOUS | Status: DC

## 2022-03-04 MED ORDER — SODIUM CHLORIDE 0.9 % IV BOLUS
1000.0000 mL | Freq: Once | INTRAVENOUS | Status: AC
  Administered 2022-03-04: 1000 mL via INTRAVENOUS

## 2022-03-04 NOTE — ED Triage Notes (Addendum)
Out of insulin for a couple of days, called EMS earlier today with CBG 224mg /dL. Started feeling lethargic, assumed his BS was low and drank several sodas. Called EMS again, CBG >500 now; positive N/V, lethargy. Denies fever.  Received 500cc NS with EMS

## 2022-03-04 NOTE — ED Provider Notes (Signed)
Emergency Department Provider Note   I have reviewed the triage vital signs and the nursing notes.   HISTORY  Chief Complaint Hyperglycemia   HPI Joseph Barr is a 26 y.o. male with IDDM presents to the ED with hyperglycemia and decreased alertness. Patient tells me that he ran out of his insulin yesterday and has been relying on 70/30 insulin. He has associated nausea and vomiting with no abdominal or CP. No fever. He had fatigue earlier and drank several sodas thinking that hypoglycemia could have developed but now has much higher CBG. No EtOH or drugs. No falls or head injury.    Past Medical History:  Diagnosis Date   Diabetes mellitus     Review of Systems  Constitutional: No fever/chills. Positive fatigue.  Cardiovascular: Denies chest pain. Respiratory: Denies shortness of breath. Gastrointestinal: No abdominal pain. Positive nausea and vomiting.  No diarrhea.  No constipation. Musculoskeletal: Negative for back pain. Skin: Negative for rash. Neurological: Negative for headaches.  ____________________________________________   PHYSICAL EXAM:  VITAL SIGNS: ED Triage Vitals  Enc Vitals Group     BP 03/04/22 2112 112/83     Pulse Rate 03/04/22 2114 (!) 115     Resp 03/04/22 2115 (!) 24     Temp 03/04/22 2112 (!) 97.2 F (36.2 C)     Temp Source 03/04/22 2112 Oral     SpO2 03/04/22 2114 96 %     Weight 03/04/22 2113 145 lb (65.8 kg)   Constitutional: Drowsy but awakens to voice. No distress.  Eyes: Conjunctivae are normal.  Head: Atraumatic. Nose: No congestion/rhinnorhea. Mouth/Throat: Mucous membranes are dry.  Neck: No stridor.   Cardiovascular: Tachycardia. Good peripheral circulation. Grossly normal heart sounds.   Respiratory: Normal respiratory effort.  No retractions. Lungs CTAB. Gastrointestinal: Soft and nontender. No distention.  Musculoskeletal:  No gross deformities of extremities. Neurologic:  Normal speech and language. No focal  weakness/numbness.  Skin:  Skin is warm, dry and intact. No rash noted. ____________________________________________   LABS (all labs ordered are listed, but only abnormal results are displayed)  Labs Reviewed  BASIC METABOLIC PANEL - Abnormal; Notable for the following components:      Result Value   Sodium 133 (*)    Chloride 96 (*)    CO2 15 (*)    Glucose, Bld 417 (*)    BUN 30 (*)    Creatinine, Ser 2.04 (*)    GFR, Estimated 45 (*)    Anion gap >20 (*)    All other components within normal limits  CBC - Abnormal; Notable for the following components:   WBC 25.8 (*)    Platelets 437 (*)    All other components within normal limits  BLOOD GAS, VENOUS - Abnormal; Notable for the following components:   pCO2, Ven 38 (*)    Bicarbonate 19.6 (*)    Acid-base deficit 5.9 (*)    All other components within normal limits  CBG MONITORING, ED - Abnormal; Notable for the following components:   Glucose-Capillary 423 (*)    All other components within normal limits  CBG MONITORING, ED - Abnormal; Notable for the following components:   Glucose-Capillary 341 (*)    All other components within normal limits  HEPATIC FUNCTION PANEL  LIPASE, BLOOD  MAGNESIUM  URINALYSIS, ROUTINE W REFLEX MICROSCOPIC  HIV ANTIBODY (ROUTINE TESTING W REFLEX)  BASIC METABOLIC PANEL  BASIC METABOLIC PANEL  BASIC METABOLIC PANEL  BASIC METABOLIC PANEL  BETA-HYDROXYBUTYRIC ACID  BETA-HYDROXYBUTYRIC ACID  CBC WITH DIFFERENTIAL/PLATELET  COMPREHENSIVE METABOLIC PANEL  MAGNESIUM  PHOSPHORUS   ____________________________________________  EKG   EKG Interpretation  Date/Time:  Tuesday March 04 2022 21:38:05 EDT Ventricular Rate:  118 PR Interval:  116 QRS Duration: 89 QT Interval:  338 QTC Calculation: 474 R Axis:   91 Text Interpretation: Sinus tachycardia Borderline right axis deviation Nonspecific T abnormalities, inferior leads Borderline ST elevation, inferior leads Borderline prolonged  QT interval Unchanged from April 2023 tracing Confirmed by Alona Bene [phone]) on 03/04/2022 9:41:50 PM         ____________________________________________   PROCEDURES  Procedure(s) performed:   .Critical Care  Performed by: Maia Plan, MD Authorized by: Maia Plan, MD   Critical care provider statement:    Critical care time (minutes):  35   Critical care time was exclusive of:  Separately billable procedures and treating other patients and teaching time   Critical care was necessary to treat or prevent imminent or life-threatening deterioration of the following conditions:  Endocrine crisis   Critical care was time spent personally by me on the following activities:  Development of treatment plan with patient or surrogate, discussions with consultants, evaluation of patient's response to treatment, examination of patient, ordering and review of laboratory studies, ordering and review of radiographic studies, ordering and performing treatments and interventions, pulse oximetry, re-evaluation of patient's condition, review of old charts and obtaining history from patient or surrogate   I assumed direction of critical care for this patient from another provider in my specialty: no     Care discussed with: admitting provider     ____________________________________________   INITIAL IMPRESSION / ASSESSMENT AND PLAN / ED COURSE  Pertinent labs & imaging results that were available during my care of the patient were reviewed by me and considered in my medical decision making (see chart for details).   This patient is Presenting for Evaluation of AMS, which does require a range of treatment options, and is a complaint that involves a high risk of morbidity and mortality.  The Differential Diagnoses includes but is not exclusive to alcohol, illicit or prescription medications, intracranial pathology such as stroke, intracerebral hemorrhage, fever or infectious causes including  sepsis, hypoxemia, uremia, trauma, endocrine related disorders such as diabetes, hypoglycemia, thyroid-related diseases, etc.   Critical Interventions-    Medications  lactated ringers bolus 1,316 mL (has no administration in time range)  insulin regular, human (MYXREDLIN) 100 units/ 100 mL infusion (7 Units/hr Intravenous New Bag/Given 03/04/22 2250)  lactated ringers infusion (has no administration in time range)  dextrose 5 % in lactated ringers infusion (has no administration in time range)  dextrose 50 % solution 0-50 mL (has no administration in time range)  potassium chloride 10 mEq in 100 mL IVPB (10 mEq Intravenous New Bag/Given 03/04/22 2243)  ondansetron (ZOFRAN) injection 4 mg (has no administration in time range)  promethazine (PHENERGAN) 12.5 mg in sodium chloride 0.9 % 50 mL IVPB (has no administration in time range)  acetaminophen (TYLENOL) tablet 650 mg (has no administration in time range)  oxyCODONE (Oxy IR/ROXICODONE) immediate release tablet 5 mg (has no administration in time range)  polyethylene glycol (MIRALAX / GLYCOLAX) packet 17 g (has no administration in time range)  melatonin tablet 5 mg (has no administration in time range)  sodium chloride 0.9 % bolus 1,000 mL (1,000 mLs Intravenous New Bag/Given 03/04/22 2243)    Reassessment after intervention: Patient with unchanged but not worsening mental status.    I decided  to review pertinent External Data, and in summary patient's last admit was in April with DKA and similar presentation.   Clinical Laboratory Tests Ordered, included patient with evidence of DKA on labs with glucose of 417, CO2 15, anion gap greater than 20.  Creatinine showing acute kidney injury now at 2.04 with elevated BUN.  Leukocytosis to 25.8 likely hemoconcentration.  Patient without fever.   Radiologic Tests: Considered neuro imaging but patient with no outward signs of head trauma. No focal neuro deficits. Defer imaging for now.   Cardiac  Monitor Tracing which shows sinus tachycardia.   Social Determinants of Health Risk patient is a former smoker.   Consult complete with   Hospitalist, Dr. Margo Aye. Plan for admit.   Medical Decision Making: Summary:  Patient presents emergency department with fatigue, drowsiness, hyperglycemia.  He has had nausea and vomiting.  Clinically concern for DKA which is been the case with him in the past.  Ran out of his insulin yesterday and covering with 70/30.  No outward sign of head trauma or focal neurodeficit to prompt neuroimaging although considered.   Reevaluation with update and discussion with patient. Plan to admit for DKA.  Disposition: discharge  ____________________________________________  FINAL CLINICAL IMPRESSION(S) / ED DIAGNOSES  Final diagnoses:  Diabetic ketoacidosis without coma associated with type 1 diabetes mellitus (HCC)   Note:  This document was prepared using Dragon voice recognition software and may include unintentional dictation errors.  Alona Bene, MD, [facility] Emergency Medicine    Farris Geiman, Arlyss Repress, MD 03/04/22 [phone]

## 2022-03-04 NOTE — H&P (Signed)
History and Physical  Joseph Barr ACZ:[id number] DOB: May 22, 1996 DOA: 03/04/2022  Referring physician: Dr. Laverta Baltimore, [place]  PCP: [name], NP  Outpatient Specialists: None Patient coming from: Home.  Chief Complaint: Ran out of insulin 2 days ago, lethargy, nausea and vomiting.  HPI: Joseph Barr is a 26 y.o. male with medical history significant for type 1 diabetes, who presented to [facility] ED due to running out of his insulin for the past 2 days and lethargy.  EMS was activated and the patient was noted to be lethargic on presentation.  The patient initially thought his blood sugar was low and drank several sodas to compensate.  He subsequently had nausea and vomiting.  No subjective fevers.  He received 500 cc IV fluid bolus with EMS in route.  In the ED, work-up is consistent for DKA type I.  The patient was given 2.5 L IV fluid boluses, IV potassium replacement, and was started on insulin drip.  TRH, hospitalist service, was asked to admit.  ED Course: Tmax 97.2.  Only BP 96/71, pulse 134, respiration rate 19, O2 saturation 98% on room air.  Lab studies significant for serum sodium 133, chloride 96, serum bicarb 15, serum glucose 473, BUN 30, creatinine 2.04, anion gap greater than 20, GFR 45.  WBC 25.8.  Platelets 437.  Review of Systems: Review of systems as noted in the HPI. All other systems reviewed and are negative.   Past Medical History:  Diagnosis Date   Diabetes mellitus    No past surgical history on file.  Social History:  reports that he has quit smoking. His smoking use included e-cigarettes. He has quit using smokeless tobacco. He reports current drug use. Drug: Marijuana. He reports that he does not drink alcohol.   No Known Allergies  Family History  Problem Relation Age of Onset   Cancer Maternal Grandfather    Hypertension Mother    Diabetes Neg Hx       Prior to Admission medications   Medication Sig Start Date End Date Taking? Authorizing Provider   insulin glargine (LANTUS) 100 UNIT/ML Solostar Pen Inject 18 Units into the skin 2 (two) times daily. 12/12/21 04/05/22 Yes Vanna Scotland, MD  insulin lispro (HUMALOG) 100 UNIT/ML injection Inject 0.05 mLs (5 Units total) into the skin 3 (three) times daily with meals. 12/12/21  Yes Vanna Scotland, MD  Blood Glucose Monitoring Suppl (TRUE METRIX METER) w/Device KIT Use as directed 12/21/20   Camillia Herter, NP  glucose blood (ACCU-CHEK AVIVA) test strip 1 each by Other route 4 (four) times daily. And lancets 250.03 06/26/14   Renato Shin, MD  glucose blood (TRUE METRIX BLOOD GLUCOSE TEST) test strip Use as instructed 01/08/21   Camillia Herter, NP  Insulin Pen Needle 31G X 5 MM MISC Use to inject Lantus twice daily. 12/16/21   Ladell Pier, MD  Insulin Syringe-Needle U-100 31G X 15/64" 0.3 ML MISC Use to inject Humalog 3x daily. Keep upcoming appt for additional refills. 12/16/21   Ladell Pier, MD  pantoprazole (PROTONIX) 40 MG tablet Take 1 tablet (40 mg total) by mouth at bedtime. 12/12/21 01/15/22  Vanna Scotland, MD  TRUEplus Lancets 28G MISC Use as directed 01/08/21   Camillia Herter, NP    Physical Exam: BP 100/76   Pulse (!) 131   Temp (!) 97.2 F (36.2 C) (Oral)   Resp (!) 21   Wt 65.8 kg   SpO2 100%   BMI 19.67 kg/m  General: 26 y.o. year-old male well developed well nourished in no acute distress.  Alert and oriented x3. Cardiovascular: Regular rate and rhythm with no rubs or gallops.  No thyromegaly or JVD noted.  No lower extremity edema. 2/4 pulses in all 4 extremities. Respiratory: Clear to auscultation with no wheezes or rales. Good inspiratory effort. Abdomen: Soft nontender nondistended with normal bowel sounds x4 quadrants. Muskuloskeletal: No cyanosis, clubbing or edema noted bilaterally Neuro: CN II-XII intact, strength, sensation, reflexes Skin: No ulcerative lesions noted or rashes Psychiatry: Judgement and insight appear normal. Mood is appropriate for condition  and setting          Labs on Admission:  Basic Metabolic Panel: Recent Labs  Lab 03/04/22 2130  NA 133*  K 3.9  CL 96*  CO2 15*  GLUCOSE 417*  BUN 30*  CREATININE 2.04*  CALCIUM 9.4  MG 2.2   Liver Function Tests: Recent Labs  Lab 03/04/22 2130  AST 20  ALT 22  ALKPHOS 85  BILITOT 0.9  PROT 8.1  ALBUMIN 4.2   Recent Labs  Lab 03/04/22 2130  LIPASE 35   No results for input(s): "AMMONIA" in the last 168 hours. CBC: Recent Labs  Lab 03/04/22 2130  WBC 25.8*  HGB 15.5  HCT 43.4  MCV 81.4  PLT 437*   Cardiac Enzymes: No results for input(s): "CKTOTAL", "CKMB", "CKMBINDEX", "TROPONINI" in the last 168 hours.  BNP (last 3 results) No results for input(s): "BNP" in the last 8760 hours.  ProBNP (last 3 results) No results for input(s): "PROBNP" in the last 8760 hours.  CBG: Recent Labs  Lab 03/04/22 2127 03/04/22 2245  GLUCAP 423* 341*    Radiological Exams on Admission: No results found.  EKG: I independently viewed the EKG done and my findings are as followed: Sinus tachycardia rate of 118.  Nonspecific ST-T changes.  QTc 474.  Assessment/Plan Present on Admission:  DKA, type 1 ([place])  Principal Problem:   DKA, type 1 ([place])  DKA type I secondary to running out of his insulin. DKA protocol in place Continue aggressive IV fluid hydration Continue insulin drip Continue potassium replacement and BMP every 4 hours Beta-hydroxybutyrate acid every 8 hours Last hemoglobin A1c greater than 15.5 on 12/10/2018. Repeat hemoglobin A1c  High anion gap metabolic acidosis secondary to DKA type I Presented with serum bicarb of 15 with anion gap greater than 20 Venous blood gas 7.3 2/38/19. Repeat BMP every 4 hours  AKI, prerenal in the setting of severe dehydration from DKA 1 Baseline creatinine appears to be 0.6 with GFR greater than 60 Presented with creatinine of 2.04 with GFR of 45 Closely monitor urine output Avoid nephrotoxic agents,  dehydration and hypotension. Repeat renal panel in the morning  Leukocytosis, suspect reactive in the setting of DKA WBC 25.8 K on admission Afebrile Nonseptic appearing Monitor WBC and fever curve.  Thrombocytosis, reactive in the setting of DKA Platelet count 437 on admission.  GERD Resume home   Critical care time: 65 minutes.    DVT prophylaxis: SCDs  Code Status: Full code  Family Communication: None at bedside  Disposition Plan: Admitted to stepdown unit  Consults called: None.  Admission status: Inpatient status.   Status is: Inpatient The patient requires at least 2 midnights for further evaluation and treatment of present condition.   Kayleen Memos MD [facility] Pager [phone]  If 7PM-7AM, please contact night-coverage [email or web address] Password [email or web address]  03/04/2022, 10:50 PM

## 2022-03-05 DIAGNOSIS — E1010 Type 1 diabetes mellitus with ketoacidosis without coma: Secondary | ICD-10-CM | POA: Diagnosis not present

## 2022-03-05 LAB — GLUCOSE, CAPILLARY
Glucose-Capillary: 100 mg/dL — ABNORMAL HIGH (ref 70–99)
Glucose-Capillary: 106 mg/dL — ABNORMAL HIGH (ref 70–99)
Glucose-Capillary: 108 mg/dL — ABNORMAL HIGH (ref 70–99)
Glucose-Capillary: 116 mg/dL — ABNORMAL HIGH (ref 70–99)
Glucose-Capillary: 126 mg/dL — ABNORMAL HIGH (ref 70–99)
Glucose-Capillary: 129 mg/dL — ABNORMAL HIGH (ref 70–99)
Glucose-Capillary: 136 mg/dL — ABNORMAL HIGH (ref 70–99)
Glucose-Capillary: 173 mg/dL — ABNORMAL HIGH (ref 70–99)
Glucose-Capillary: 189 mg/dL — ABNORMAL HIGH (ref 70–99)
Glucose-Capillary: 48 mg/dL — ABNORMAL LOW (ref 70–99)
Glucose-Capillary: 64 mg/dL — ABNORMAL LOW (ref 70–99)
Glucose-Capillary: 65 mg/dL — ABNORMAL LOW (ref 70–99)
Glucose-Capillary: 69 mg/dL — ABNORMAL LOW (ref 70–99)
Glucose-Capillary: 74 mg/dL (ref 70–99)
Glucose-Capillary: 74 mg/dL (ref 70–99)
Glucose-Capillary: 76 mg/dL (ref 70–99)
Glucose-Capillary: 78 mg/dL (ref 70–99)
Glucose-Capillary: 80 mg/dL (ref 70–99)
Glucose-Capillary: 97 mg/dL (ref 70–99)

## 2022-03-05 LAB — COMPREHENSIVE METABOLIC PANEL
ALT: 18 U/L (ref 0–44)
AST: 14 U/L — ABNORMAL LOW (ref 15–41)
Albumin: 3.7 g/dL (ref 3.5–5.0)
Alkaline Phosphatase: 69 U/L (ref 38–126)
Anion gap: 10 (ref 5–15)
BUN: 24 mg/dL — ABNORMAL HIGH (ref 6–20)
CO2: 27 mmol/L (ref 22–32)
Calcium: 9.2 mg/dL (ref 8.9–10.3)
Chloride: 100 mmol/L (ref 98–111)
Creatinine, Ser: 1.08 mg/dL (ref 0.61–1.24)
GFR, Estimated: >60 mL/min (ref >60)
Glucose, Bld: 75 mg/dL (ref 70–99)
Potassium: 3.5 mmol/L (ref 3.5–5.1)
Sodium: 137 mmol/L (ref 135–145)
Total Bilirubin: 0.6 mg/dL (ref 0.3–1.2)
Total Protein: 6.9 g/dL (ref 6.5–8.1)

## 2022-03-05 LAB — BASIC METABOLIC PANEL
Anion gap: 11 (ref 5–15)
Anion gap: 7 (ref 5–15)
BUN: 24 mg/dL — ABNORMAL HIGH (ref 6–20)
BUN: 19 mg/dL (ref 6–20)
CO2: 26 mmol/L (ref 22–32)
CO2: 27 mmol/L (ref 22–32)
Calcium: 9 mg/dL (ref 8.9–10.3)
Calcium: 8.8 mg/dL — ABNORMAL LOW (ref 8.9–10.3)
Chloride: 100 mmol/L (ref 98–111)
Chloride: 106 mmol/L (ref 98–111)
Creatinine, Ser: 1.08 mg/dL (ref 0.61–1.24)
Creatinine, Ser: 0.97 mg/dL (ref 0.61–1.24)
GFR, Estimated: >60 mL/min (ref >60)
GFR, Estimated: >60 mL/min (ref >60)
Glucose, Bld: 73 mg/dL (ref 70–99)
Glucose, Bld: 68 mg/dL — ABNORMAL LOW (ref 70–99)
Potassium: 3.5 mmol/L (ref 3.5–5.1)
Potassium: 3.4 mmol/L — ABNORMAL LOW (ref 3.5–5.1)
Sodium: 137 mmol/L (ref 135–145)
Sodium: 140 mmol/L (ref 135–145)

## 2022-03-05 LAB — CBC WITH DIFFERENTIAL/PLATELET
Abs Immature Granulocytes: 0.1 10*3/uL — ABNORMAL HIGH (ref 0.00–0.07)
Basophils Absolute: 0.1 10*3/uL (ref 0.0–0.1)
Basophils Relative: 0 %
Eosinophils Absolute: 0 10*3/uL (ref 0.0–0.5)
Eosinophils Relative: 0 %
HCT: 38.3 % — ABNORMAL LOW (ref 39.0–52.0)
Hemoglobin: 14.1 g/dL (ref 13.0–17.0)
Immature Granulocytes: 1 %
Lymphocytes Relative: 9 %
Lymphs Abs: 1.7 10*3/uL (ref 0.7–4.0)
MCH: 29.6 pg (ref 26.0–34.0)
MCHC: 36.8 g/dL — ABNORMAL HIGH (ref 30.0–36.0)
MCV: 80.5 fL (ref 80.0–100.0)
Monocytes Absolute: 1.7 10*3/uL — ABNORMAL HIGH (ref 0.1–1.0)
Monocytes Relative: 9 %
Neutro Abs: 15.9 10*3/uL — ABNORMAL HIGH (ref 1.7–7.7)
Neutrophils Relative %: 81 %
Platelets: 336 10*3/uL (ref 150–400)
RBC: 4.76 MIL/uL (ref 4.22–5.81)
RDW: 11.9 % (ref 11.5–15.5)
WBC: 19.5 10*3/uL — ABNORMAL HIGH (ref 4.0–10.5)
nRBC: 0 % (ref 0.0–0.2)

## 2022-03-05 LAB — HIV ANTIBODY (ROUTINE TESTING W REFLEX): HIV Screen 4th Generation wRfx: NONREACTIVE

## 2022-03-05 LAB — BETA-HYDROXYBUTYRIC ACID: Beta-Hydroxybutyric Acid: 0.56 mmol/L — ABNORMAL HIGH (ref 0.05–0.27)

## 2022-03-05 LAB — PHOSPHORUS: Phosphorus: 2.6 mg/dL (ref 2.5–4.6)

## 2022-03-05 LAB — TSH: TSH: 2.573 u[IU]/mL (ref 0.350–4.500)

## 2022-03-05 LAB — MRSA NEXT GEN BY PCR, NASAL: MRSA by PCR Next Gen: NOT DETECTED

## 2022-03-05 LAB — MAGNESIUM: Magnesium: 1.9 mg/dL (ref 1.7–2.4)

## 2022-03-05 MED ORDER — DEXTROSE 50 % IV SOLN
INTRAVENOUS | Status: AC
  Administered 2022-03-05: 25 mL via INTRAVENOUS
  Filled 2022-03-05: qty 50

## 2022-03-05 MED ORDER — TRAMADOL HCL 50 MG PO TABS: 50.0000 mg | ORAL_TABLET | Freq: Four times a day (QID) | ORAL | Status: DC | PRN

## 2022-03-05 MED ORDER — SODIUM CHLORIDE 0.9 % IV SOLN: INTRAVENOUS | Status: DC

## 2022-03-05 MED ORDER — INSULIN ASPART 100 UNIT/ML IJ SOLN
3.0000 [IU] | Freq: Three times a day (TID) | INTRAMUSCULAR | Status: DC
  Administered 2022-03-06: 3 [IU] via SUBCUTANEOUS

## 2022-03-05 MED ORDER — CHLORHEXIDINE GLUCONATE CLOTH 2 % EX PADS
6.0000 | MEDICATED_PAD | Freq: Every day | CUTANEOUS | Status: DC
  Administered 2022-03-05: 6 via TOPICAL

## 2022-03-05 MED ORDER — INSULIN GLARGINE-YFGN 100 UNIT/ML ~~LOC~~ SOLN
18.0000 [IU] | Freq: Every day | SUBCUTANEOUS | Status: DC
  Administered 2022-03-06: 18 [IU] via SUBCUTANEOUS
  Filled 2022-03-05: qty 0.18

## 2022-03-05 MED ORDER — ORAL CARE MOUTH RINSE: 15.0000 mL | OROMUCOSAL | Status: DC | PRN

## 2022-03-05 MED ORDER — INSULIN ASPART 100 UNIT/ML IJ SOLN: 0.0000 [IU] | Freq: Every day | INTRAMUSCULAR | Status: DC

## 2022-03-05 MED ORDER — INSULIN GLARGINE-YFGN 100 UNIT/ML ~~LOC~~ SOLN
18.0000 [IU] | Freq: Two times a day (BID) | SUBCUTANEOUS | Status: DC
  Administered 2022-03-05: 18 [IU] via SUBCUTANEOUS
  Filled 2022-03-05 (×2): qty 0.18

## 2022-03-05 MED ORDER — SODIUM CHLORIDE 0.9 % IV BOLUS
1000.0000 mL | Freq: Once | INTRAVENOUS | Status: AC
  Administered 2022-03-05: 1000 mL via INTRAVENOUS

## 2022-03-05 MED ORDER — INSULIN ASPART 100 UNIT/ML IJ SOLN
0.0000 [IU] | Freq: Three times a day (TID) | INTRAMUSCULAR | Status: DC
  Administered 2022-03-06: 2 [IU] via SUBCUTANEOUS

## 2022-03-05 NOTE — Progress Notes (Signed)
Inpatient Diabetes Program Recommendations  AACE/ADA: New Consensus Statement on Inpatient Glycemic Control (2015)  Target Ranges:  Prepandial:   less than 140 mg/dL      Peak postprandial:   less than 180 mg/dL (1-2 hours)      Critically ill patients:  140 - 180 mg/dL   Lab Results  Component Value Date   GLUCAP 129 (H) 03/05/2022   HGBA1C >15.5 (H) 12/09/2021    Review of Glycemic Control  Diabetes history: DM1 Outpatient Diabetes medications: Lantus 18 BID, Humalog 5 units TID Current orders for Inpatient glycemic control: Semglee 18 BID, Novolog 0-9 units TID with meals and 0-5 HS + 3 units TID  HgbA1C - > 15.5%  Inpatient Diabetes Program Recommendations:    Increase Novolog to 5 units TID with meals if FBS > 180 mg/dL.  Spoke with pt at bedside. Verified insulin and doses and asked pt why he ran out of insulin. States [facility] pharmacy was closed and he didn't realize he was almost out of insulin. States he checks blood sugars (in forearm) every day and said he usually takes insulin as prescribed. When asked about his HgbA1C of > 15.5%, pt then said he didn't want to speak with me anymore. Asked me to leave his room.   This Diabetes Coordinator has seen pt regarding his uncontrolled DM1 on multiple occasions. Pt gets medicine for no charge. Pt has not been seen by [facility] since 2021.   Will need to make appt with PCP.  Thank you. Ailene Ards, RD, LDN, CDE Inpatient Diabetes Coordinator [phone]

## 2022-03-05 NOTE — TOC Initial Note (Addendum)
Transition of Care [facility]) - Initial/Assessment Note    Patient Details  Name: Joseph Barr MRN: [id number] Date of Birth: 02/04/1996  Transition of [facility]) CM/SW Contact:    Golda Acre, RN Phone Number: 03/05/2022, 7:33 AM  Clinical Narrative:                 [phone] chart reviewed.  Following for toc needs. Pt will need medication assistance for insulin and pcp. Will arrange f/u visit to the [place] and wellness clinic.  Pt has no insurance. Plan is to return home with self-care at this time. Patient has a hospital f/u appointment on [phone] at 0930 with dr Alvis Lemmings.  This added to discharge instructions. Expected Discharge Plan: Home/Self Care Barriers to Discharge: Continued Medical Work up   Patient Goals and CMS Choice Patient states their goals for this hospitalization and ongoing recovery are:: to go home CMS Medicare.gov Compare Post Acute Care list provided to:: Patient    Expected Discharge Plan and Services Expected Discharge Plan: Home/Self Care   Discharge Planning Services: CM Consult, Medication Assistance   Living arrangements for the past 2 months: Apartment                                      Prior Living Arrangements/Services Living arrangements for the past 2 months: Apartment Lives with:: Self Patient language and need for interpreter reviewed:: Yes Do you feel safe going back to the place where you live?: Yes      Need for Family Participation in Patient Care: Yes (Comment) (mother)     Criminal Activity/Legal Involvement Pertinent to Current Situation/Hospitalization: No - Comment as needed  Activities of Daily Living      Permission Sought/Granted                  Emotional Assessment Appearance:: Appears stated age     Orientation: : Oriented to Self, Oriented to Place, Oriented to  Time, Oriented to Situation Alcohol / Substance Use: Not Applicable Psych Involvement: No (comment)  Admission diagnosis:  DKA,  type 1 (HCC) [E10.10] Diabetic ketoacidosis without coma associated with type 1 diabetes mellitus (HCC) [E10.10] Patient Active Problem List   Diagnosis Date Noted   Dehydration 12/10/2021   Leukocytosis 12/10/2021   GERD (gastroesophageal reflux disease) 12/10/2021   Malnutrition of moderate degree 12/10/2021   DKA (diabetic ketoacidosis) (HCC) 12/09/2021   Influenza A 08/28/2021   Sepsis with acute renal failure without septic shock (HCC)    Pneumonia of right lower lobe due to infectious organism    Intractable nausea and vomiting 08/27/2021   Cellulitis 06/06/2021   Pseudohyponatremia 12/03/2020   Hypochloremia 12/03/2020   SIRS (systemic inflammatory response syndrome) (HCC) 11/19/2019   Renal insufficiency 11/19/2019   Hyperkalemia 06/11/2017   Tobacco abuse 06/11/2017   Chest pain 06/11/2017   Increased anion gap metabolic acidosis 06/11/2017   Diabetic ketoacidosis without coma associated with type 1 diabetes mellitus (HCC) 04/24/2015   DKA, type 1 (HCC) 04/24/2015   AKI (acute kidney injury) (HCC) 04/24/2015   Nausea with vomiting 12/06/2014   Uncontrolled diabetes mellitus with hyperglycemia (HCC) 01/21/2012   Diabetes mellitus type 1 (HCC) 01/06/2012   Hyperglycemia 01/06/2012   Ketonuria 01/06/2012   Headache(784.0) 01/06/2012   PCP:  Claiborne Rigg, NP Pharmacy:   [facility] - [place], [place] - [address] [place]  [address] Phone: [phone] Fax: [phone]  [facility] [address] [place] [address] Phone: [phone] Fax: [phone]     Social Determinants of Health (SDOH) Interventions    Readmission Risk Interventions    11/21/2019    2:23 PM  Readmission Risk Prevention Plan  Post Dischage Appt Not Complete  Appt Comments continue to work to re-establish pt at [facility] and Wellness vs alternate health clinic  Medication Screening  Complete  Transportation Screening Complete

## 2022-03-05 NOTE — Progress Notes (Signed)
Joseph Barr  WCB:[id number] DOB: [birth year] DOA: 03/04/2022 PCP: [name], NP    Brief Narrative:  26 year old with a history of DM 1 who presented to the [facility] ER 2 days after having run out of his insulin leading to significant lethargy nausea and vomiting.  The patient was concerned his blood sugar was low and therefore drank several sweetened sodas.  In the ER he was found to be in DKA.  Consultants:  None  Goals of Care:  Code Status: Full Code   DVT prophylaxis: SCDs  Interim Hx: Afebrile.  Remains tachycardic at 125-135 bpm.  Blood pressure stable.  Saturations 96% on room air. No new complaints. Feels very tired. Denies cp, sob, n/v, abdom pain.   Assessment & Plan:  DKA without coma in uncontrolled DM type I with severe hyperglycemia Due to insulin noncompliance -gap closed -wean off insulin drip today  Sinus tachycardia Continue to hydrate -TSH normal  Leukocytosis Due to DKA -no clinical evidence of an active infection  Acute kidney injury Baseline creatinine 0.6 with creatinine 2.04 time of presentation -due to DKA  GERD  Sepsis / Severe Sepsis ruled out SIRS criteria met due to DKA - organ failure (kidney injury) due to DKA - no active infection - this was not sepsis   Family Communication: no family present  Disposition: Has no insurance -needs PCP and medication assistance   Objective: Blood pressure 101/61, pulse (!) 102, temperature (!) 97.4 F (36.3 C), temperature source Oral, resp. rate 15, weight 52.4 kg, SpO2 98 %.  Intake/Output Summary (Last 24 hours) at 03/05/2022 1608 Last data filed at 03/05/2022 1606 Gross per 24 hour  Intake 3047.17 ml  Output 1050 ml  Net 1997.17 ml   Filed Weights   03/04/22 2113 03/04/22 2300  Weight: 65.8 kg 52.4 kg    Examination: General: No acute respiratory distress Lungs: Clear to auscultation bilaterally without wheezes or crackles Cardiovascular: Regular tachycardia without murmur gallop or rub  normal S1 and S2 Abdomen: Nontender, nondistended, soft, bowel sounds positive, no rebound, no ascites, no appreciable mass Extremities: No significant cyanosis, clubbing, or edema bilateral lower extremities  CBC: Recent Labs  Lab 03/04/22 2130 03/05/22 0300  WBC 25.8* 19.5*  NEUTROABS  --  15.9*  HGB 15.5 14.1  HCT 43.4 38.3*  MCV 81.4 80.5  PLT 437* 616   Basic Metabolic Panel: Recent Labs  Lab 03/04/22 2130 03/05/22 0625 03/05/22 1124  NA 133* 137  137 140  K 3.9 3.5  3.5 3.4*  CL 96* 100  100 106  CO2 15* '27  26 27  ' GLUCOSE 417* 75  73 68*  BUN 30* 24*  24* 19  CREATININE 2.04* 1.08  1.08 0.97  CALCIUM 9.4 9.2  9.0 8.8*  MG 2.2 1.9  --   PHOS  --  2.6  --    GFR: Estimated Creatinine Clearance: 84.8 mL/min (by C-G formula based on SCr of 0.97 mg/dL).  Liver Function Tests: Recent Labs  Lab 03/04/22 2130 03/05/22 0625  AST 20 14*  ALT 22 18  ALKPHOS 85 69  BILITOT 0.9 0.6  PROT 8.1 6.9  ALBUMIN 4.2 3.7   Recent Labs  Lab 03/04/22 2130  LIPASE 35    HbA1C: Hgb A1c MFr Bld  Date/Time Value Ref Range Status  12/09/2021 10:16 PM >15.5 (H) 4.8 - 5.6 % Final    Comment:    (NOTE) **Verified by repeat analysis**  Prediabetes: 5.7 - 6.4         Diabetes: >6.4         Glycemic control for adults with diabetes: <7.0   08/27/2021 02:08 AM 14.4 (H) 4.8 - 5.6 % Final    Comment:    (NOTE) Pre diabetes:          5.7%-6.4%  Diabetes:              >6.4%  Glycemic control for   <7.0% adults with diabetes     CBG: Recent Labs  Lab 03/05/22 0941 03/05/22 1050 03/05/22 1213 03/05/22 1239 03/05/22 1604  GLUCAP 97 74 48* 129* 78    Scheduled Meds:  Chlorhexidine Gluconate Cloth  6 each Topical Daily   insulin aspart  0-5 Units Subcutaneous QHS   insulin aspart  0-9 Units Subcutaneous TID WC   insulin aspart  3 Units Subcutaneous TID WC   insulin glargine-yfgn  18 Units Subcutaneous BID   pantoprazole  40 mg Oral QHS    Continuous Infusions:  sodium chloride 125 mL/hr at 03/05/22 1500   promethazine (PHENERGAN) injection (IM or IVPB) Stopped (03/05/22 0231)     LOS: 1 day   Cherene Altes, MD [facility] Office  [phone] Pager - Text Page per Shea Evans  If 7PM-7AM, please contact night-coverage per Amion 03/05/2022, 4:08 PM

## 2022-03-06 ENCOUNTER — Other Ambulatory Visit: Payer: Self-pay

## 2022-03-06 DIAGNOSIS — E1010 Type 1 diabetes mellitus with ketoacidosis without coma: Secondary | ICD-10-CM | POA: Diagnosis not present

## 2022-03-06 LAB — GLUCOSE, CAPILLARY
Glucose-Capillary: 74 mg/dL (ref 70–99)
Glucose-Capillary: 161 mg/dL — ABNORMAL HIGH (ref 70–99)
Glucose-Capillary: 49 mg/dL — ABNORMAL LOW (ref 70–99)
Glucose-Capillary: 158 mg/dL — ABNORMAL HIGH (ref 70–99)

## 2022-03-06 LAB — BASIC METABOLIC PANEL
Anion gap: 6 (ref 5–15)
BUN: 6 mg/dL (ref 6–20)
CO2: 28 mmol/L (ref 22–32)
Calcium: 8.2 mg/dL — ABNORMAL LOW (ref 8.9–10.3)
Chloride: 105 mmol/L (ref 98–111)
Creatinine, Ser: 0.74 mg/dL (ref 0.61–1.24)
GFR, Estimated: >60 mL/min (ref >60)
Glucose, Bld: 67 mg/dL — ABNORMAL LOW (ref 70–99)
Potassium: 3.2 mmol/L — ABNORMAL LOW (ref 3.5–5.1)
Sodium: 139 mmol/L (ref 135–145)

## 2022-03-06 LAB — CBC
HCT: 30.6 % — ABNORMAL LOW (ref 39.0–52.0)
Hemoglobin: 10.7 g/dL — ABNORMAL LOW (ref 13.0–17.0)
MCH: 29.2 pg (ref 26.0–34.0)
MCHC: 35 g/dL (ref 30.0–36.0)
MCV: 83.4 fL (ref 80.0–100.0)
Platelets: 275 10*3/uL (ref 150–400)
RBC: 3.67 MIL/uL — ABNORMAL LOW (ref 4.22–5.81)
RDW: 12.6 % (ref 11.5–15.5)
WBC: 10.8 10*3/uL — ABNORMAL HIGH (ref 4.0–10.5)
nRBC: 0 % (ref 0.0–0.2)

## 2022-03-06 LAB — HEMOGLOBIN A1C
Hgb A1c MFr Bld: >15.5 % — ABNORMAL HIGH (ref 4.8–5.6)
Mean Plasma Glucose: >398 mg/dL

## 2022-03-06 MED ORDER — INSULIN GLARGINE 100 UNIT/ML SOLOSTAR PEN
18.0000 [IU] | PEN_INJECTOR | Freq: Two times a day (BID) | SUBCUTANEOUS | 11 refills | Status: DC
  Filled 2022-04-14: qty 12, 33d supply, fill #0
  Filled 2022-05-26: qty 12, 33d supply, fill #1
  Filled 2022-07-07: qty 12, 30d supply, fill #1
  Filled 2022-09-02: qty 18, 50d supply, fill #2
  Filled 2022-12-01: qty 18, 50d supply, fill #3

## 2022-03-06 MED ORDER — INSULIN PEN NEEDLE 31G X 5 MM MISC
11 refills | Status: DC
  Filled 2022-05-26: qty 100, 50d supply, fill #0
  Filled 2022-07-07: qty 100, 30d supply, fill #0
  Filled 2022-09-02: qty 100, 30d supply, fill #1
  Filled 2022-12-01: qty 100, 30d supply, fill #2

## 2022-03-06 MED ORDER — TRUEPLUS LANCETS 28G MISC
11 refills | Status: DC
  Filled 2022-03-06: qty 100, 25d supply, fill #0

## 2022-03-06 MED ORDER — TRUE METRIX METER W/DEVICE KIT
PACK | 0 refills | Status: DC
  Filled 2022-03-06: qty 1, 1d supply, fill #0

## 2022-03-06 MED ORDER — GLUCOSE BLOOD VI STRP
1.0000 | ORAL_STRIP | Freq: Four times a day (QID) | 11 refills | Status: DC
  Filled 2022-03-06: qty 100, 25d supply, fill #0

## 2022-03-06 MED ORDER — "INSULIN SYRINGE-NEEDLE U-100 31G X 15/64"" 0.3 ML MISC"
11 refills | Status: DC
  Filled 2022-05-26: qty 100, 33d supply, fill #0
  Filled 2022-07-07: qty 100, 30d supply, fill #0
  Filled 2022-09-02: qty 100, 30d supply, fill #1
  Filled 2022-12-01: qty 100, 30d supply, fill #2

## 2022-03-06 MED ORDER — INSULIN LISPRO 100 UNIT/ML IJ SOLN
5.0000 [IU] | Freq: Three times a day (TID) | INTRAMUSCULAR | 11 refills | Status: DC
  Filled 2022-04-14: qty 10, 28d supply, fill #0
  Filled 2022-05-26 – 2022-07-07 (×2): qty 10, 28d supply, fill #1
  Filled 2022-09-02: qty 10, 28d supply, fill #2
  Filled 2022-12-01: qty 10, 28d supply, fill #3

## 2022-03-06 NOTE — Progress Notes (Signed)
Nursing Discharge Note   Admit Date: 03/04/2022  Discharge date: 03/06/2022   Joseph Barr is to be discharged home per MD order.  AVS completed. Reviewed with patient at bedside. Highlighted copy provided for patient to take home.  Patient able to verbalize understanding of discharge instructions. PIV removed. Patient stable upon discharge.  Patient aware that he needs to go to his Pharmacy to pick up medications that SW Curtice coordinated for him.   Allergies as of 03/06/2022   No Known Allergies      Medication List     STOP taking these medications    pantoprazole 40 MG tablet Commonly known as: PROTONIX       TAKE these medications    insulin glargine 100 UNIT/ML Solostar Pen Commonly known as: LANTUS Inject 18 Units into the skin 2 (two) times daily.   insulin lispro 100 UNIT/ML injection Commonly known as: HUMALOG Inject 0.05 mLs (5 Units total) into the skin 3 (three) times daily with meals.   Insulin Pen Needle 31G X 5 MM Misc Use to inject Lantus twice daily.   Insulin Syringe-Needle U-100 31G X 15/64" 0.3 ML Misc Use to inject Humalog 3x daily. Keep upcoming appt for additional refills.   True Metrix Blood Glucose Test test strip Generic drug: glucose blood Use as instructed What changed: Another medication with the same name was changed. Make sure you understand how and when to take each.   glucose blood test strip use as directed to test blood sugars four times a day What changed: additional instructions   True Metrix Meter w/Device Kit Use as directed   TRUEplus Lancets 28G Misc use as directed to test blood sugars four times a day What changed: additional instructions         Discharge Instructions/ Education: Discharge instructions given to patient with verbalized understanding. Discharge education completed with patient including: follow up instructions, medication list, discharge activities, and limitations if indicated. Patient instructed  to return to Emergency Department, call 911, or call MD for any changes in condition.  Patient ambulatory to lobby and discharged home home via private automobile.

## 2022-03-06 NOTE — Discharge Summary (Signed)
DISCHARGE SUMMARY  Joseph Barr  MR#: [id number]  DOB:14-Jan-1996  Date of Admission: 03/04/2022 Date of Discharge: 03/06/2022  Attending Physician:Granger Chui Hennie Duos, MD  Patient's YHC:[id number], Joseph Buff, NP  Consults: none   Disposition: D/C home   Follow-up Appts:  Follow-up Information     [place]. Go on 04/02/2022.   Why: go to appointment on [id number] at 0930am  please arrive 30 mins early to do paperwork. Contact information: [facility] [address] [phone]                Tests Needing Follow-up: -assess ongoing CBG control/compliance w/ insulin dosing   Discharge Diagnoses: DKA without coma in uncontrolled DM type I with severe hyperglycemia Acute kidney injury Sepsis ruled out / Severe Sepsis ruled out SIRS criteria met due to DKA - organ failure (kidney injury) due to DKA - no active infection - this was not sepsis   Initial presentation: 26 year old with a history of DM 1 who presented to the [facility] ER 2 days after having run out of his insulin leading to significant lethargy nausea and vomiting.  The patient was concerned his blood sugar was low and therefore drank several sweetened sodas. In the ER he was found to be in DKA.  Hospital Course:  DKA without coma in uncontrolled DM type I with severe hyperglycemia Due to insulin noncompliance - gap closed quickly - weaned off insulin drip w/o incident, w/ CBG subsequently reasonably well controlled - counseled by MD and DM Coordinator on importance of insulin compliance, and need for outpatient f/u, as well as close monitoring of CBG as outpt    Sinus tachycardia TSH normal - due to DKA and signif volume depletion - resolved w/ simple IVF resuscitation    Leukocytosis Due to DKA - no clinical evidence of an active infection   Acute kidney injury Baseline creatinine 0.6 with creatinine 2.04 time of presentation - due to DKA -  resolved w/ volume expansion    Sepsis ruled out / Severe Sepsis ruled out SIRS criteria met due to DKA - organ failure (kidney injury) due to DKA - no active infection - this was not sepsis   Allergies as of 03/06/2022   No Known Allergies      Medication List     STOP taking these medications    pantoprazole 40 MG tablet Commonly known as: PROTONIX       TAKE these medications    insulin glargine 100 UNIT/ML Solostar Pen Commonly known as: LANTUS Inject 18 Units into the skin 2 (two) times daily.   insulin lispro 100 UNIT/ML injection Commonly known as: HUMALOG Inject 0.05 mLs (5 Units total) into the skin 3 (three) times daily with meals.   Insulin Pen Needle 31G X 5 MM Misc Use to inject Lantus twice daily.   Insulin Syringe-Needle U-100 31G X 15/64" 0.3 ML Misc Use to inject Humalog 3x daily. Keep upcoming appt for additional refills.   True Metrix Blood Glucose Test test strip Generic drug: glucose blood Use as instructed What changed: Another medication with the same name was changed. Make sure you understand how and when to take each.   glucose blood test strip use as directed to test blood sugars four times a day What changed: additional instructions   True Metrix Meter w/Device Kit Use as directed   TRUEplus Lancets 28G Misc use as directed to test blood sugars four times a day  What changed: additional instructions        Day of Discharge BP 127/77 (BP Location: Right Arm)   Pulse 91   Temp 99.3 F (37.4 C) (Oral)   Resp 15   Wt 52.4 kg   SpO2 98%   BMI 15.67 kg/m   Physical Exam: General: No acute respiratory distress Lungs: Clear to auscultation bilaterally without wheezes or crackles Cardiovascular: Regular rate and rhythm without murmur gallop or rub normal S1 and S2 Abdomen: Nontender, nondistended, soft, bowel sounds positive, no rebound, no ascites, no appreciable mass Extremities: No significant cyanosis, clubbing, or edema  bilateral lower extremities  Basic Metabolic Panel: Recent Labs  Lab 03/04/22 2130 03/05/22 0625 03/05/22 1124 03/06/22 0548  NA 133* 137  137 140 139  K 3.9 3.5  3.5 3.4* 3.2*  CL 96* 100  100 106 105  CO2 15* '27  26 27 28  ' GLUCOSE 417* 75  73 68* 67*  BUN 30* 24*  24* 19 6  CREATININE 2.04* 1.08  1.08 0.97 0.74  CALCIUM 9.4 9.2  9.0 8.8* 8.2*  MG 2.2 1.9  --   --   PHOS  --  2.6  --   --    CBC: Recent Labs  Lab 03/04/22 2130 03/05/22 0300 03/06/22 0548  WBC 25.8* 19.5* 10.8*  NEUTROABS  --  15.9*  --   HGB 15.5 14.1 10.7*  HCT 43.4 38.3* 30.6*  MCV 81.4 80.5 83.4  PLT 437* 336 275    CBG: Recent Labs  Lab 03/05/22 1739 03/05/22 1758 03/05/22 1953 03/06/22 0748 03/06/22 1101  GLUCAP 64* 100* 136* 74 161*    Time spent in discharge (includes decision making & examination of pt): 35 minutes  03/06/2022, 11:24 AM   Cherene Altes, MD [facility] Office  [phone]

## 2022-03-06 NOTE — Progress Notes (Signed)
Hypoglycemic Event  CBG: 49 at 1501  Treatment: 8 oz juice/soda  Symptoms: None, just felt funny and that he knew his sugar was dropping   Follow-up CBG: Time:1603 CBG Result:158  Possible Reasons for Event: Inadequate meal intake  Comments/MD notified:Dr. Sharon Seller made aware of hypoglycemic event. Dr. Quay Burow states patient is stable for discharge at this time.

## 2022-03-06 NOTE — TOC Transition Note (Signed)
Transition of Care [facility]) - CM/SW Discharge Note   Patient Details  Name: Joseph Barr MRN: [id number] Date of Birth: July 26, 1996  Transition of Care [facility] CM/SW Contact:  Vassie Moselle, LCSW Phone Number: 03/06/2022, 12:03 PM   Clinical Narrative:    Met with pt to discuss medication costs and access. Pt states he currently does not have a job and is unable to afford the cost of medications at $34. He is agreeable to utilizing his one time free medication fill. He reports that his friend is to pick him up at 4pm today and he plans to go directly to the pharmacy to pick up his medications prior to them closing at 6pm.     Final next level of care: Home/Self Care Barriers to Discharge: Barriers Resolved   Patient Goals and CMS Choice Patient states their goals for this hospitalization and ongoing recovery are:: to go home CMS Medicare.gov Compare Post Acute Care list provided to:: Patient Choice offered to / list presented to : Patient  Discharge Placement                       Discharge Plan and Services   Discharge Planning Services: CM Consult, Medication Assistance            DME Arranged: N/A DME Agency: NA                  Social Determinants of Health (SDOH) Interventions     Readmission Risk Interventions    03/06/2022   12:02 PM 11/21/2019    2:23 PM  Readmission Risk Prevention Plan  Post Dischage Appt  Not Complete  Appt Comments  continue to work to re-establish pt at [facility] vs alternate health clinic  Medication Screening  Complete  Transportation Screening Complete Complete  PCP or Specialist Appt within 5-7 Days Complete   Home Care Screening Complete   Medication Review (RN CM) Complete

## 2022-03-07 ENCOUNTER — Other Ambulatory Visit: Payer: Self-pay

## 2022-03-10 ENCOUNTER — Telehealth: Payer: Self-pay

## 2022-03-10 NOTE — Telephone Encounter (Signed)
Transition Care Management Unsuccessful Follow-up Telephone Call  Date of discharge and from where:  03/06/2022, [facility]  Attempts:  1st Attempt  Reason for unsuccessful TCM follow-up call:  No answer/busy  # [phone]- just rings fast busy.  He has an appointment at [facility] with Dr Alvis Lemmings - 04/02/2022.

## 2022-03-11 ENCOUNTER — Telehealth: Payer: Self-pay

## 2022-03-11 NOTE — Telephone Encounter (Signed)
Transition Care Management Unsuccessful Follow-up Telephone Call  Date of discharge and from where:  03/06/2022, [facility]   Attempts:  2nd Attempt  Reason for unsuccessful TCM follow-up call:  Unable to reach patient  # [phone]- just rings fast busy.   He has an appointment at [facility] with Dr Alvis Lemmings - 04/02/2022.

## 2022-03-12 ENCOUNTER — Telehealth: Payer: Self-pay

## 2022-03-12 NOTE — Telephone Encounter (Signed)
Transition Care Management Unsuccessful Follow-up Telephone Call  Date of discharge and from where:  03/06/2022, [facility]  Attempts:  3rd Attempt  Reason for unsuccessful TCM follow-up call:  Unable to reach patient-# [phone]- just rings fast busy.   He has an appointment at [facility] with Dr Alvis Lemmings - 04/02/2022.

## 2022-04-02 ENCOUNTER — Inpatient Hospital Stay: Payer: Medicaid Other | Admitting: Family Medicine

## 2022-04-14 ENCOUNTER — Other Ambulatory Visit: Payer: Self-pay

## 2022-04-18 ENCOUNTER — Other Ambulatory Visit: Payer: Self-pay

## 2022-05-26 ENCOUNTER — Other Ambulatory Visit: Payer: Self-pay

## 2022-06-02 ENCOUNTER — Other Ambulatory Visit: Payer: Self-pay

## 2022-07-05 ENCOUNTER — Inpatient Hospital Stay (HOSPITAL_COMMUNITY)
Admission: EM | Admit: 2022-07-05 | Discharge: 2022-07-07 | DRG: 637 | Disposition: A | Discharge status: Home / Self Care | Payer: 59 | Attending: Internal Medicine | Admitting: Internal Medicine

## 2022-07-05 ENCOUNTER — Encounter (HOSPITAL_COMMUNITY): Payer: Self-pay | Admitting: Emergency Medicine

## 2022-07-05 ENCOUNTER — Other Ambulatory Visit: Payer: Self-pay

## 2022-07-05 DIAGNOSIS — E861 Hypovolemia: Secondary | ICD-10-CM | POA: Diagnosis present

## 2022-07-05 DIAGNOSIS — N179 Acute kidney failure, unspecified: Secondary | ICD-10-CM | POA: Diagnosis present

## 2022-07-05 DIAGNOSIS — R739 Hyperglycemia, unspecified: Secondary | ICD-10-CM | POA: Diagnosis not present

## 2022-07-05 DIAGNOSIS — Z87891 Personal history of nicotine dependence: Secondary | ICD-10-CM

## 2022-07-05 DIAGNOSIS — E1010 Type 1 diabetes mellitus with ketoacidosis without coma: Secondary | ICD-10-CM | POA: Diagnosis not present

## 2022-07-05 DIAGNOSIS — R42 Dizziness and giddiness: Secondary | ICD-10-CM | POA: Diagnosis not present

## 2022-07-05 DIAGNOSIS — E1110 Type 2 diabetes mellitus with ketoacidosis without coma: Secondary | ICD-10-CM | POA: Diagnosis present

## 2022-07-05 DIAGNOSIS — U071 COVID-19: Secondary | ICD-10-CM | POA: Diagnosis present

## 2022-07-05 DIAGNOSIS — R000 Tachycardia, unspecified: Secondary | ICD-10-CM | POA: Diagnosis not present

## 2022-07-05 DIAGNOSIS — Z743 Need for continuous supervision: Secondary | ICD-10-CM | POA: Diagnosis not present

## 2022-07-05 DIAGNOSIS — E875 Hyperkalemia: Secondary | ICD-10-CM | POA: Diagnosis present

## 2022-07-05 DIAGNOSIS — Z809 Family history of malignant neoplasm, unspecified: Secondary | ICD-10-CM

## 2022-07-05 DIAGNOSIS — Z794 Long term (current) use of insulin: Secondary | ICD-10-CM

## 2022-07-05 DIAGNOSIS — Z8249 Family history of ischemic heart disease and other diseases of the circulatory system: Secondary | ICD-10-CM

## 2022-07-05 DIAGNOSIS — E860 Dehydration: Secondary | ICD-10-CM | POA: Diagnosis not present

## 2022-07-05 LAB — CBG MONITORING, ED: Glucose-Capillary: 453 mg/dL — ABNORMAL HIGH (ref 70–99)

## 2022-07-05 MED ORDER — LACTATED RINGERS IV BOLUS
2000.0000 mL | Freq: Once | INTRAVENOUS | Status: AC
  Administered 2022-07-06: 2000 mL via INTRAVENOUS

## 2022-07-05 MED ORDER — ONDANSETRON HCL 4 MG/2ML IJ SOLN
4.0000 mg | Freq: Once | INTRAMUSCULAR | Status: AC
  Administered 2022-07-06: 4 mg via INTRAVENOUS
  Filled 2022-07-05: qty 2

## 2022-07-05 MED ORDER — KETOROLAC TROMETHAMINE 15 MG/ML IJ SOLN
15.0000 mg | Freq: Once | INTRAMUSCULAR | Status: AC
  Administered 2022-07-06: 15 mg via INTRAVENOUS
  Filled 2022-07-05: qty 1

## 2022-07-05 NOTE — ED Notes (Addendum)
POC CBG 453

## 2022-07-05 NOTE — ED Triage Notes (Signed)
  Patient BIB [facility] for hyperglycemia and weakness.  Patient has hx of type 1 diabetes.  Was recently exposed to family member with covid.  Running fevers at home.  N/V the last 24 hours. Tmax 100.8 at home.  Pain 9/10, throat pain.

## 2022-07-06 ENCOUNTER — Emergency Department (HOSPITAL_COMMUNITY): Payer: 59

## 2022-07-06 ENCOUNTER — Encounter (HOSPITAL_COMMUNITY): Payer: Self-pay | Admitting: Family Medicine

## 2022-07-06 DIAGNOSIS — U071 COVID-19: Secondary | ICD-10-CM | POA: Diagnosis not present

## 2022-07-06 DIAGNOSIS — E1010 Type 1 diabetes mellitus with ketoacidosis without coma: Secondary | ICD-10-CM | POA: Diagnosis not present

## 2022-07-06 DIAGNOSIS — N179 Acute kidney failure, unspecified: Secondary | ICD-10-CM

## 2022-07-06 DIAGNOSIS — E875 Hyperkalemia: Secondary | ICD-10-CM

## 2022-07-06 DIAGNOSIS — Z8249 Family history of ischemic heart disease and other diseases of the circulatory system: Secondary | ICD-10-CM | POA: Diagnosis not present

## 2022-07-06 DIAGNOSIS — Z809 Family history of malignant neoplasm, unspecified: Secondary | ICD-10-CM | POA: Diagnosis not present

## 2022-07-06 DIAGNOSIS — R509 Fever, unspecified: Secondary | ICD-10-CM | POA: Diagnosis not present

## 2022-07-06 DIAGNOSIS — E861 Hypovolemia: Secondary | ICD-10-CM | POA: Diagnosis not present

## 2022-07-06 DIAGNOSIS — Z87891 Personal history of nicotine dependence: Secondary | ICD-10-CM | POA: Diagnosis not present

## 2022-07-06 DIAGNOSIS — E1110 Type 2 diabetes mellitus with ketoacidosis without coma: Secondary | ICD-10-CM | POA: Diagnosis present

## 2022-07-06 DIAGNOSIS — R531 Weakness: Secondary | ICD-10-CM | POA: Diagnosis not present

## 2022-07-06 DIAGNOSIS — R739 Hyperglycemia, unspecified: Secondary | ICD-10-CM | POA: Diagnosis not present

## 2022-07-06 DIAGNOSIS — Z794 Long term (current) use of insulin: Secondary | ICD-10-CM | POA: Diagnosis not present

## 2022-07-06 DIAGNOSIS — R059 Cough, unspecified: Secondary | ICD-10-CM | POA: Diagnosis not present

## 2022-07-06 LAB — CBC WITH DIFFERENTIAL/PLATELET
Abs Immature Granulocytes: 0.11 10*3/uL — ABNORMAL HIGH (ref 0.00–0.07)
Basophils Absolute: 0 10*3/uL (ref 0.0–0.1)
Basophils Relative: 0 %
Eosinophils Absolute: 0 10*3/uL (ref 0.0–0.5)
Eosinophils Relative: 0 %
HCT: 45 % (ref 39.0–52.0)
Hemoglobin: 14.8 g/dL (ref 13.0–17.0)
Immature Granulocytes: 1 %
Lymphocytes Relative: 11 %
Lymphs Abs: 0.9 10*3/uL (ref 0.7–4.0)
MCH: 29.4 pg (ref 26.0–34.0)
MCHC: 32.9 g/dL (ref 30.0–36.0)
MCV: 89.5 fL (ref 80.0–100.0)
Monocytes Absolute: 1.3 10*3/uL — ABNORMAL HIGH (ref 0.1–1.0)
Monocytes Relative: 15 %
Neutro Abs: 6.2 10*3/uL (ref 1.7–7.7)
Neutrophils Relative %: 73 %
Platelets: 269 10*3/uL (ref 150–400)
RBC: 5.03 MIL/uL (ref 4.22–5.81)
RDW: 12.5 % (ref 11.5–15.5)
WBC: 8.6 10*3/uL (ref 4.0–10.5)
nRBC: 0 % (ref 0.0–0.2)

## 2022-07-06 LAB — URINALYSIS, ROUTINE W REFLEX MICROSCOPIC
Bacteria, UA: NONE SEEN
Bilirubin Urine: NEGATIVE
Glucose, UA: >=500 mg/dL — AB
Hgb urine dipstick: NEGATIVE
Ketones, ur: 80 mg/dL — AB
Leukocytes,Ua: NEGATIVE
Nitrite: NEGATIVE
Protein, ur: 30 mg/dL — AB
Specific Gravity, Urine: 1.021 (ref 1.005–1.030)
pH: 5 (ref 5.0–8.0)

## 2022-07-06 LAB — CBG MONITORING, ED
Glucose-Capillary: 392 mg/dL — ABNORMAL HIGH (ref 70–99)
Glucose-Capillary: 421 mg/dL — ABNORMAL HIGH (ref 70–99)
Glucose-Capillary: 340 mg/dL — ABNORMAL HIGH (ref 70–99)
Glucose-Capillary: 282 mg/dL — ABNORMAL HIGH (ref 70–99)
Glucose-Capillary: 199 mg/dL — ABNORMAL HIGH (ref 70–99)
Glucose-Capillary: 219 mg/dL — ABNORMAL HIGH (ref 70–99)
Glucose-Capillary: 198 mg/dL — ABNORMAL HIGH (ref 70–99)
Glucose-Capillary: 180 mg/dL — ABNORMAL HIGH (ref 70–99)
Glucose-Capillary: 160 mg/dL — ABNORMAL HIGH (ref 70–99)
Glucose-Capillary: 163 mg/dL — ABNORMAL HIGH (ref 70–99)
Glucose-Capillary: 177 mg/dL — ABNORMAL HIGH (ref 70–99)
Glucose-Capillary: 145 mg/dL — ABNORMAL HIGH (ref 70–99)
Glucose-Capillary: 171 mg/dL — ABNORMAL HIGH (ref 70–99)
Glucose-Capillary: 162 mg/dL — ABNORMAL HIGH (ref 70–99)
Glucose-Capillary: 174 mg/dL — ABNORMAL HIGH (ref 70–99)

## 2022-07-06 LAB — COMPREHENSIVE METABOLIC PANEL
ALT: 21 U/L (ref 0–44)
AST: 31 U/L (ref 15–41)
Albumin: 4.5 g/dL (ref 3.5–5.0)
Alkaline Phosphatase: 73 U/L (ref 38–126)
Anion gap: 27 — ABNORMAL HIGH (ref 5–15)
BUN: 25 mg/dL — ABNORMAL HIGH (ref 6–20)
CO2: 8 mmol/L — ABNORMAL LOW (ref 22–32)
Calcium: 9 mg/dL (ref 8.9–10.3)
Chloride: 100 mmol/L (ref 98–111)
Creatinine, Ser: 1.81 mg/dL — ABNORMAL HIGH (ref 0.61–1.24)
GFR, Estimated: 52 mL/min — ABNORMAL LOW (ref >60)
Glucose, Bld: 481 mg/dL — ABNORMAL HIGH (ref 70–99)
Potassium: 6.2 mmol/L — ABNORMAL HIGH (ref 3.5–5.1)
Sodium: 135 mmol/L (ref 135–145)
Total Bilirubin: 1.5 mg/dL — ABNORMAL HIGH (ref 0.3–1.2)
Total Protein: 8.4 g/dL — ABNORMAL HIGH (ref 6.5–8.1)

## 2022-07-06 LAB — BASIC METABOLIC PANEL
Anion gap: 18 — ABNORMAL HIGH (ref 5–15)
Anion gap: 12 (ref 5–15)
Anion gap: 8 (ref 5–15)
Anion gap: 10 (ref 5–15)
BUN: 23 mg/dL — ABNORMAL HIGH (ref 6–20)
BUN: 21 mg/dL — ABNORMAL HIGH (ref 6–20)
BUN: 18 mg/dL (ref 6–20)
BUN: 16 mg/dL (ref 6–20)
CO2: 13 mmol/L — ABNORMAL LOW (ref 22–32)
CO2: 18 mmol/L — ABNORMAL LOW (ref 22–32)
CO2: 21 mmol/L — ABNORMAL LOW (ref 22–32)
CO2: 23 mmol/L (ref 22–32)
Calcium: 8.7 mg/dL — ABNORMAL LOW (ref 8.9–10.3)
Calcium: 8.6 mg/dL — ABNORMAL LOW (ref 8.9–10.3)
Calcium: 8.4 mg/dL — ABNORMAL LOW (ref 8.9–10.3)
Calcium: 8.8 mg/dL — ABNORMAL LOW (ref 8.9–10.3)
Chloride: 108 mmol/L (ref 98–111)
Chloride: 106 mmol/L (ref 98–111)
Chloride: 106 mmol/L (ref 98–111)
Chloride: 105 mmol/L (ref 98–111)
Creatinine, Ser: 1.58 mg/dL — ABNORMAL HIGH (ref 0.61–1.24)
Creatinine, Ser: 1.3 mg/dL — ABNORMAL HIGH (ref 0.61–1.24)
Creatinine, Ser: 0.93 mg/dL (ref 0.61–1.24)
Creatinine, Ser: 0.97 mg/dL (ref 0.61–1.24)
GFR, Estimated: >60 mL/min (ref >60)
GFR, Estimated: >60 mL/min (ref >60)
GFR, Estimated: >60 mL/min (ref >60)
GFR, Estimated: >60 mL/min (ref >60)
Glucose, Bld: 264 mg/dL — ABNORMAL HIGH (ref 70–99)
Glucose, Bld: 194 mg/dL — ABNORMAL HIGH (ref 70–99)
Glucose, Bld: 171 mg/dL — ABNORMAL HIGH (ref 70–99)
Glucose, Bld: 163 mg/dL — ABNORMAL HIGH (ref 70–99)
Potassium: 4.5 mmol/L (ref 3.5–5.1)
Potassium: 4.6 mmol/L (ref 3.5–5.1)
Potassium: 4.4 mmol/L (ref 3.5–5.1)
Potassium: 4 mmol/L (ref 3.5–5.1)
Sodium: 139 mmol/L (ref 135–145)
Sodium: 136 mmol/L (ref 135–145)
Sodium: 135 mmol/L (ref 135–145)
Sodium: 138 mmol/L (ref 135–145)

## 2022-07-06 LAB — BLOOD GAS, VENOUS
Acid-base deficit: 19.7 mmol/L — ABNORMAL HIGH (ref 0.0–2.0)
Bicarbonate: 8.1 mmol/L — ABNORMAL LOW (ref 20.0–28.0)
O2 Saturation: 93.8 %
Patient temperature: 37
pCO2, Ven: 25 mmHg — ABNORMAL LOW (ref 44–60)
pH, Ven: 7.12 — CL (ref 7.25–7.43)
pO2, Ven: 65 mmHg — ABNORMAL HIGH (ref 32–45)

## 2022-07-06 LAB — BETA-HYDROXYBUTYRIC ACID
Beta-Hydroxybutyric Acid: 7.2 mmol/L — ABNORMAL HIGH (ref 0.05–0.27)
Beta-Hydroxybutyric Acid: 7.8 mmol/L — ABNORMAL HIGH (ref 0.05–0.27)
Beta-Hydroxybutyric Acid: 3.96 mmol/L — ABNORMAL HIGH (ref 0.05–0.27)

## 2022-07-06 LAB — PROCALCITONIN: Procalcitonin: 1.69 ng/mL

## 2022-07-06 LAB — GLUCOSE, CAPILLARY: Glucose-Capillary: 82 mg/dL (ref 70–99)

## 2022-07-06 LAB — RESP PANEL BY RT-PCR (FLU A&B, COVID) ARPGX2
Influenza A by PCR: NEGATIVE
Influenza B by PCR: NEGATIVE
SARS Coronavirus 2 by RT PCR: POSITIVE — AB

## 2022-07-06 LAB — LIPASE, BLOOD: Lipase: 22 U/L (ref 11–51)

## 2022-07-06 MED ORDER — ACETAMINOPHEN 325 MG PO TABS: 650.0000 mg | ORAL_TABLET | Freq: Four times a day (QID) | ORAL | Status: DC | PRN

## 2022-07-06 MED ORDER — INSULIN ASPART 100 UNIT/ML IJ SOLN
4.0000 [IU] | Freq: Three times a day (TID) | INTRAMUSCULAR | Status: DC
  Administered 2022-07-07: 4 [IU] via SUBCUTANEOUS
  Filled 2022-07-06: qty 0.04

## 2022-07-06 MED ORDER — HYDROCOD POLI-CHLORPHE POLI ER 10-8 MG/5ML PO SUER: 5.0000 mL | Freq: Two times a day (BID) | ORAL | Status: DC | PRN

## 2022-07-06 MED ORDER — LACTATED RINGERS IV SOLN: INTRAVENOUS | Status: DC

## 2022-07-06 MED ORDER — INSULIN GLARGINE-YFGN 100 UNIT/ML ~~LOC~~ SOLN
18.0000 [IU] | Freq: Two times a day (BID) | SUBCUTANEOUS | Status: DC
  Administered 2022-07-06 – 2022-07-07 (×3): 18 [IU] via SUBCUTANEOUS
  Filled 2022-07-06 (×5): qty 0.18

## 2022-07-06 MED ORDER — DEXTROSE 50 % IV SOLN: 0.0000 mL | INTRAVENOUS | Status: DC | PRN

## 2022-07-06 MED ORDER — DEXTROSE IN LACTATED RINGERS 5 % IV SOLN: INTRAVENOUS | Status: DC

## 2022-07-06 MED ORDER — GUAIFENESIN-DM 100-10 MG/5ML PO SYRP: 10.0000 mL | ORAL_SOLUTION | ORAL | Status: DC | PRN

## 2022-07-06 MED ORDER — ENOXAPARIN SODIUM 40 MG/0.4ML IJ SOSY
40.0000 mg | PREFILLED_SYRINGE | INTRAMUSCULAR | Status: DC
  Administered 2022-07-06 – 2022-07-07 (×2): 40 mg via SUBCUTANEOUS
  Filled 2022-07-06 (×2): qty 0.4

## 2022-07-06 MED ORDER — INSULIN ASPART 100 UNIT/ML IJ SOLN
0.0000 [IU] | Freq: Every day | INTRAMUSCULAR | Status: DC
  Filled 2022-07-06: qty 0.05

## 2022-07-06 MED ORDER — OXYCODONE HCL 5 MG PO TABS: 5.0000 mg | ORAL_TABLET | ORAL | Status: DC | PRN

## 2022-07-06 MED ORDER — INSULIN REGULAR(HUMAN) IN NACL 100-0.9 UT/100ML-% IV SOLN
INTRAVENOUS | Status: DC
  Administered 2022-07-06: 7 [IU]/h via INTRAVENOUS

## 2022-07-06 MED ORDER — INSULIN ASPART 100 UNIT/ML IJ SOLN
0.0000 [IU] | Freq: Three times a day (TID) | INTRAMUSCULAR | Status: DC
  Administered 2022-07-06 – 2022-07-07 (×3): 3 [IU] via SUBCUTANEOUS
  Filled 2022-07-06: qty 0.15

## 2022-07-06 MED ORDER — INSULIN REGULAR(HUMAN) IN NACL 100-0.9 UT/100ML-% IV SOLN
INTRAVENOUS | Status: DC
  Administered 2022-07-06: 8 [IU]/h via INTRAVENOUS
  Filled 2022-07-06: qty 100

## 2022-07-06 NOTE — H&P (Signed)
History and Physical    Joseph Barr TKW:[id number] DOB: 1996/06/26 DOA: 07/05/2022  PCP: [name], NP   Patient coming from: Home   Chief Complaint: Fever, cough, fatigue, N/V, sore throat, hyperglycemia   HPI: Joseph Barr is a 26 y.o. male with medical history significant for poorly controlled type 1 diabetes mellitus who presents to the emergency department with fever, cough, fatigue, nausea, vomiting, sore throat, and hyperglycemia after recent exposure to COVID-19.  Patient reports he began to feel generally ill on 07/04/2022 with fever, chills, general malaise, sore throat, nausea, and vomiting.  He denies missing any doses of insulin.  He denies chest pain.  He has had a mild cough but no significant shortness of breath.  He denies abdominal pain and states that he has not vomited tonight.  ED Course: Upon arrival to the ED, patient is found to be afebrile, saturating well on room air, and tachycardic with stable blood pressure.  He is found to be hyperglycemic with bicarbonate of 8, anion gap 27, potassium 6.2, creatinine 1.81, ketonuria, and positive COVID-19 PCR.  He was given 2 L of LR, Zofran, Toradol, and started on IV insulin infusion in the ED.  Review of Systems:  All other systems reviewed and apart from HPI, are negative.  Past Medical History:  Diagnosis Date   Diabetes mellitus     History reviewed. No pertinent surgical history.  Social History:   reports that he has quit smoking. His smoking use included e-cigarettes. He has quit using smokeless tobacco. He reports current drug use. Drug: Marijuana. He reports that he does not drink alcohol.  No Known Allergies  Family History  Problem Relation Age of Onset   Cancer Maternal Grandfather    Hypertension Mother    Diabetes Neg Hx      Prior to Admission medications   Medication Sig Start Date End Date Taking? Authorizing Provider  acetaminophen (TYLENOL) 325 MG tablet Take 650 mg by mouth every  6 (six) hours as needed for mild pain.   Yes [provider]  insulin glargine (LANTUS) 100 UNIT/ML Solostar Pen Inject 18 Units into the skin 2 (two) times daily. 03/06/22  Yes Cherene Altes, MD  insulin lispro (HUMALOG) 100 UNIT/ML injection Inject 0.05 mLs (5 Units total) into the skin 3 (three) times daily with meals. 03/06/22  Yes Cherene Altes, MD  Blood Glucose Monitoring Suppl (TRUE METRIX METER) w/Device KIT Use as directed 03/06/22   Joette Catching T, MD  glucose blood test strip use as directed to test blood sugars four times a day 03/06/22   Cherene Altes, MD  glucose blood (TRUE METRIX BLOOD GLUCOSE TEST) test strip Use as instructed 01/08/21   Camillia Herter, NP  Insulin Pen Needle 31G X 5 MM MISC Use to inject Lantus twice daily. 03/06/22   Cherene Altes, MD  Insulin Syringe-Needle U-100 31G X 15/64" 0.3 ML MISC Use to inject Humalog 3x daily. Keep upcoming appt for additional refills. 03/06/22   Cherene Altes, MD  TRUEplus Lancets 28G MISC use as directed to test blood sugars four times a day 03/06/22   Cherene Altes, MD    Physical Exam: Vitals:   07/05/22 2334 07/05/22 2335 07/05/22 2339 07/05/22 2345  BP:  126/84  (!) 132/90  Pulse:  (!) 128  (!) 127  Resp:  (!) 22  18  Temp:  98 F (36.7 C)    TempSrc:  Oral  SpO2:  100% 100% 100%  Weight: 68 kg     Height: 6' (1.829 m)       Constitutional: NAD, no pallor or diaphoresis  Eyes: PERTLA, lids and conjunctivae normal ENMT: Mucous membranes are dry. Posterior pharynx clear of any exudate or lesions.   Neck: supple, no masses  Respiratory:  no wheezing, no crackles. No accessory muscle use.  Cardiovascular: S1 & S2 heard, regular rate and rhythm. No extremity edema.   Abdomen: No distension, no tenderness, soft. Bowel sounds active.  Musculoskeletal: no clubbing / cyanosis. No joint deformity upper and lower extremities.   Skin: no significant rashes, lesions, ulcers. Warm, dry,  well-perfused. Neurologic: CN 2-12 grossly intact. Moving all extremities. Alert and oriented.  Psychiatric: Calm. Cooperative.    Labs and Imaging on Admission: I have personally reviewed following labs and imaging studies  CBC: Recent Labs  Lab 07/06/22 0025  WBC 8.6  NEUTROABS 6.2  HGB 14.8  HCT 45.0  MCV 89.5  PLT 161   Basic Metabolic Panel: Recent Labs  Lab 07/06/22 0025  NA 135  K 6.2*  CL 100  CO2 8*  GLUCOSE 481*  BUN 25*  CREATININE 1.81*  CALCIUM 9.0   GFR: Estimated Creatinine Clearance: 59 mL/min (A) (by C-G formula based on SCr of 1.81 mg/dL (H)). Liver Function Tests: Recent Labs  Lab 07/06/22 0025  AST 31  ALT 21  ALKPHOS 73  BILITOT 1.5*  PROT 8.4*  ALBUMIN 4.5   Recent Labs  Lab 07/06/22 0025  LIPASE 22   No results for input(s): "AMMONIA" in the last 168 hours. Coagulation Profile: No results for input(s): "INR", "PROTIME" in the last 168 hours. Cardiac Enzymes: No results for input(s): "CKTOTAL", "CKMB", "CKMBINDEX", "TROPONINI" in the last 168 hours. BNP (last 3 results) No results for input(s): "PROBNP" in the last 8760 hours. HbA1C: No results for input(s): "HGBA1C" in the last 72 hours. CBG: Recent Labs  Lab 07/05/22 2337 07/06/22 0120 07/06/22 0129 07/06/22 0223  GLUCAP 453* 392* 421* 340*   Lipid Profile: No results for input(s): "CHOL", "HDL", "LDLCALC", "TRIG", "CHOLHDL", "LDLDIRECT" in the last 72 hours. Thyroid Function Tests: No results for input(s): "TSH", "T4TOTAL", "FREET4", "T3FREE", "THYROIDAB" in the last 72 hours. Anemia Panel: No results for input(s): "VITAMINB12", "FOLATE", "FERRITIN", "TIBC", "IRON", "RETICCTPCT" in the last 72 hours. Urine analysis:    Component Value Date/Time   COLORURINE STRAW (A) 07/06/2022 0113   APPEARANCEUR CLEAR 07/06/2022 0113   LABSPEC 1.021 07/06/2022 0113   PHURINE 5.0 07/06/2022 0113   GLUCOSEU >=500 (A) 07/06/2022 0113   HGBUR NEGATIVE 07/06/2022 0113    BILIRUBINUR NEGATIVE 07/06/2022 0113   BILIRUBINUR Negative 08/01/2016 0950   KETONESUR 80 (A) 07/06/2022 0113   PROTEINUR 30 (A) 07/06/2022 0113   UROBILINOGEN 0.2 08/01/2016 0950   UROBILINOGEN 0.2 04/24/2015 1305   NITRITE NEGATIVE 07/06/2022 0113   LEUKOCYTESUR NEGATIVE 07/06/2022 0113   Sepsis Labs: _0 (procalcitonin:4,lacticidven:4) ) Recent Results (from the past 240 hour(s))  Resp Panel by RT-PCR (Flu A&B, Covid) Anterior Nasal Swab     Status: Abnormal   Collection Time: 07/05/22 11:56 PM   Specimen: Anterior Nasal Swab  Result Value Ref Range Status   SARS Coronavirus 2 by RT PCR POSITIVE (A) NEGATIVE Final    Comment: (NOTE) SARS-CoV-2 target nucleic acids are DETECTED.  The SARS-CoV-2 RNA is generally detectable in upper respiratory specimens during the acute phase of infection. Positive results are indicative of the presence of the identified virus, but  do not rule out bacterial infection or co-infection with other pathogens not detected by the test. Clinical correlation with patient history and other diagnostic information is necessary to determine patient infection status. The expected result is Negative.  Fact Sheet for Patients: EntrepreneurPulse.com.au  Fact Sheet for Healthcare Providers: IncredibleEmployment.be  This test is not yet approved or cleared by the [place] FDA and  has been authorized for detection and/or diagnosis of SARS-CoV-2 by FDA under an Emergency Use Authorization (EUA).  This EUA will remain in effect (meaning this test can be used) for the duration of  the COVID-19 declaration under Section 564(b)(1) of the A ct, 21 U.S.C. section 360bbb-3(b)(1), unless the authorization is terminated or revoked sooner.     Influenza A by PCR NEGATIVE NEGATIVE Final   Influenza B by PCR NEGATIVE NEGATIVE Final    Comment: (NOTE) The Xpert Xpress SARS-CoV-2/FLU/RSV plus assay is intended as an  aid in the diagnosis of influenza from Nasopharyngeal swab specimens and should not be used as a sole basis for treatment. Nasal washings and aspirates are unacceptable for Xpert Xpress SARS-CoV-2/FLU/RSV testing.  Fact Sheet for Patients: EntrepreneurPulse.com.au  Fact Sheet for Healthcare Providers: IncredibleEmployment.be  This test is not yet approved or cleared by the [place] FDA and has been authorized for detection and/or diagnosis of SARS-CoV-2 by FDA under an Emergency Use Authorization (EUA). This EUA will remain in effect (meaning this test can be used) for the duration of the COVID-19 declaration under Section 564(b)(1) of the Act, 21 U.S.C. section 360bbb-3(b)(1), unless the authorization is terminated or revoked.  Performed at [facility], [place] [address], [place], [place] [address]      Radiological Exams on Admission: DG Chest Port 1 View  Result Date: 07/06/2022 CLINICAL DATA:  Cough, fever, hyperglycemia and weakness. Recent COVID exposure via family member. EXAM: PORTABLE CHEST 1 VIEW COMPARISON:  Portable chest 12/10/2021 FINDINGS: The heart size and mediastinal contours are within normal limits. Both lungs are clear. The visualized skeletal structures are unremarkable. IMPRESSION: No acute radiographic chest findings.  Stable chest. Electronically Signed   By: Telford Nab M.D.   On: 07/06/2022 00:14    EKG: Independently reviewed. Sinus tachycardia, rate 117.   Assessment/Plan   1. DKA  - A1c was >15.5% in July 2023  - Fluid-resuscitated and started on IV insulin infusion in ED  - Continue IVF hydration and IV insulin infusion with frequent CBGs and serial chem panels until DKA resolves   2. Hyperkalemia; AKI  - SCr is 1.81 on admission, up from baseline of 0.7 in setting of DKA and hypovolemia  - Potassium is 6.2 in setting of insulin-deficiency and AKI  - Continue cardiac monitoring,  continue IVF hydration and IV insulin infusion, follow-serial chem panels   3. COVID-19 infection  - Sxs began 07/04/22  - On room air in ED  - Continue supportive care and isolation     DVT prophylaxis: Lovenox  Code Status: Full  Level of Care: Level of care: Stepdown Family Communication: none present  Disposition Plan:  Patient is from: home  Anticipated d/c is to: home  Anticipated d/c date is: 07/08/22  Patient currently: pending glycemic-control, transition to sq insulin  Consults called: none  Admission status: Inpatient     Vianne Bulls, MD [facility]  07/06/2022, 2:28 AM

## 2022-07-06 NOTE — ED Provider Notes (Signed)
[facility] DEPT Provider Note   CSN: [id number] Arrival date & time: 07/05/22  2324     History  Chief Complaint  Patient presents with   Hyperglycemia    Joseph Barr is a 26 y.o. male.  26 yo M with a chief complaints of not feeling well.  About 48 hours of cough fever nausea and vomiting.  Patient found out that he had close contact with someone that was COVID-positive.  Is concerned he has the same.  He is a type I diabetic and has had some trouble controlling his blood sugars.  Mild diffuse abdominal discomfort.   Hyperglycemia      Home Medications Prior to Admission medications   Medication Sig Start Date End Date Taking? Authorizing Provider  acetaminophen (TYLENOL) 325 MG tablet Take 650 mg by mouth every 6 (six) hours as needed for mild pain.   Yes [provider]  insulin glargine (LANTUS) 100 UNIT/ML Solostar Pen Inject 18 Units into the skin 2 (two) times daily. 03/06/22  Yes Cherene Altes, MD  insulin lispro (HUMALOG) 100 UNIT/ML injection Inject 0.05 mLs (5 Units total) into the skin 3 (three) times daily with meals. 03/06/22  Yes Cherene Altes, MD  Blood Glucose Monitoring Suppl (TRUE METRIX METER) w/Device KIT Use as directed 03/06/22   Joette Catching T, MD  glucose blood test strip use as directed to test blood sugars four times a day 03/06/22   Cherene Altes, MD  glucose blood (TRUE METRIX BLOOD GLUCOSE TEST) test strip Use as instructed 01/08/21   Camillia Herter, NP  Insulin Pen Needle 31G X 5 MM MISC Use to inject Lantus twice daily. 03/06/22   Cherene Altes, MD  Insulin Syringe-Needle U-100 31G X 15/64" 0.3 ML MISC Use to inject Humalog 3x daily. Keep upcoming appt for additional refills. 03/06/22   Cherene Altes, MD  TRUEplus Lancets 28G MISC use as directed to test blood sugars four times a day 03/06/22   Cherene Altes, MD      Allergies    Patient has no known allergies.    Review of Systems    Review of Systems  Physical Exam Updated Vital Signs BP 102/72   Pulse (!) 120   Temp 98 F (36.7 C) (Oral)   Resp 14   Ht 6' (1.829 m)   Wt 68 kg   SpO2 97%   BMI 20.34 kg/m  Physical Exam Vitals and nursing note reviewed.  Constitutional:      Appearance: He is well-developed.  HENT:     Head: Normocephalic and atraumatic.  Eyes:     Pupils: Pupils are equal, round, and reactive to light.  Neck:     Vascular: No JVD.  Cardiovascular:     Rate and Rhythm: Regular rhythm. Tachycardia present.     Heart sounds: No murmur heard.    No friction rub. No gallop.  Pulmonary:     Effort: No respiratory distress.     Breath sounds: No wheezing.     Comments: Mild tachypnea Abdominal:     General: There is no distension.     Tenderness: There is no abdominal tenderness. There is no guarding or rebound.     Comments: Benign abdominal exam  Musculoskeletal:        General: Normal range of motion.     Cervical back: Normal range of motion and neck supple.  Skin:    Coloration: Skin is not pale.  Findings: No rash.  Neurological:     Mental Status: He is alert and oriented to person, place, and time.  Psychiatric:        Behavior: Behavior normal.     ED Results / Procedures / Treatments   Labs (all labs ordered are listed, but only abnormal results are displayed) Labs Reviewed  RESP PANEL BY RT-PCR (FLU A&B, COVID) ARPGX2 - Abnormal; Notable for the following components:      Result Value   SARS Coronavirus 2 by RT PCR POSITIVE (*)    All other components within normal limits  CBC WITH DIFFERENTIAL/PLATELET - Abnormal; Notable for the following components:   Monocytes Absolute 1.3 (*)    Abs Immature Granulocytes 0.11 (*)    All other components within normal limits  COMPREHENSIVE METABOLIC PANEL - Abnormal; Notable for the following components:   Potassium 6.2 (*)    CO2 8 (*)    Glucose, Bld 481 (*)    BUN 25 (*)    Creatinine, Ser 1.81 (*)    Total  Protein 8.4 (*)    Total Bilirubin 1.5 (*)    GFR, Estimated 52 (*)    Anion gap 27 (*)    All other components within normal limits  BETA-HYDROXYBUTYRIC ACID - Abnormal; Notable for the following components:   Beta-Hydroxybutyric Acid 7.20 (*)    All other components within normal limits  URINALYSIS, ROUTINE W REFLEX MICROSCOPIC - Abnormal; Notable for the following components:   Color, Urine STRAW (*)    Glucose, UA >=500 (*)    Ketones, ur 80 (*)    Protein, ur 30 (*)    All other components within normal limits  BLOOD GAS, VENOUS - Abnormal; Notable for the following components:   pH, Ven 7.12 (*)    pCO2, Ven 25 (*)    pO2, Ven 65 (*)    Bicarbonate 8.1 (*)    Acid-base deficit 19.7 (*)    All other components within normal limits  CBG MONITORING, ED - Abnormal; Notable for the following components:   Glucose-Capillary 453 (*)    All other components within normal limits  CBG MONITORING, ED - Abnormal; Notable for the following components:   Glucose-Capillary 421 (*)    All other components within normal limits  CBG MONITORING, ED - Abnormal; Notable for the following components:   Glucose-Capillary 392 (*)    All other components within normal limits  CBG MONITORING, ED - Abnormal; Notable for the following components:   Glucose-Capillary 340 (*)    All other components within normal limits  LIPASE, BLOOD  BASIC METABOLIC PANEL  BASIC METABOLIC PANEL  BASIC METABOLIC PANEL  BASIC METABOLIC PANEL  BASIC METABOLIC PANEL  BETA-HYDROXYBUTYRIC ACID  BETA-HYDROXYBUTYRIC ACID  BETA-HYDROXYBUTYRIC ACID  PROCALCITONIN    EKG None  Radiology DG Chest Port 1 View  Result Date: 07/06/2022 CLINICAL DATA:  Cough, fever, hyperglycemia and weakness. Recent COVID exposure via family member. EXAM: PORTABLE CHEST 1 VIEW COMPARISON:  Portable chest 12/10/2021 FINDINGS: The heart size and mediastinal contours are within normal limits. Both lungs are clear. The visualized skeletal  structures are unremarkable. IMPRESSION: No acute radiographic chest findings.  Stable chest. Electronically Signed   By: Telford Nab M.D.   On: 07/06/2022 00:14    Procedures .Critical Care  Performed by: Deno Etienne, DO Authorized by: Deno Etienne, DO   Critical care provider statement:    Critical care time (minutes):  35   Critical care time was exclusive  of:  Separately billable procedures and treating other patients   Critical care was time spent personally by me on the following activities:  Development of treatment plan with patient or surrogate, discussions with consultants, evaluation of patient's response to treatment, examination of patient, ordering and review of laboratory studies, ordering and review of radiographic studies, ordering and performing treatments and interventions, pulse oximetry, re-evaluation of patient's condition and review of old charts   Care discussed with: admitting provider       Medications Ordered in ED Medications  enoxaparin (LOVENOX) injection 40 mg (has no administration in time range)  insulin regular, human (MYXREDLIN) 100 units/ 100 mL infusion (has no administration in time range)  lactated ringers infusion (has no administration in time range)  dextrose 5 % in lactated ringers infusion (has no administration in time range)  dextrose 50 % solution 0-50 mL (has no administration in time range)  guaiFENesin-dextromethorphan (ROBITUSSIN DM) 100-10 MG/5ML syrup 10 mL (has no administration in time range)  chlorpheniramine-HYDROcodone (TUSSIONEX) 10-8 MG/5ML suspension 5 mL (has no administration in time range)  acetaminophen (TYLENOL) tablet 650 mg (has no administration in time range)  oxyCODONE (Oxy IR/ROXICODONE) immediate release tablet 5 mg (has no administration in time range)  lactated ringers bolus 2,000 mL (0 mLs Intravenous Stopped 07/06/22 0124)  ketorolac (TORADOL) 15 MG/ML injection 15 mg (15 mg Intravenous Given 07/06/22 0009)   ondansetron (ZOFRAN) injection 4 mg (4 mg Intravenous Given 07/06/22 0009)    ED Course/ Medical Decision Making/ A&P                           Medical Decision Making Amount and/or Complexity of Data Reviewed Labs: ordered. Radiology: ordered.  Risk Prescription drug management. Decision regarding hospitalization.   26 yo M with a chief complaints of cough congestion fever nausea and vomiting.  This has been going on for a couple days now.  Reportedly was in close contact with someone who was recently COVID-positive.  Concern for possible DKA based on initial presentation.  Will give 2 L of IV fluids check blood work.  Send off COVID testing.  Chest x-ray independently interpreted by me without focal infiltrate or pneumothorax.  Patient without anemia no leukocytosis.  Patient's metabolic panel is concerning for diabetic ketoacidosis with bicarb of 8 and anion gap of 27.  Has a mild bump in his renal function.  Potassium is high at 6.2.  Will start on an insulin infusion Joseph the DKA order set.  Send off for VBG.   The patients results and plan were reviewed and discussed.   Any x-rays performed were independently reviewed by myself.   Differential diagnosis were considered with the presenting HPI.  Medications  enoxaparin (LOVENOX) injection 40 mg (has no administration in time range)  insulin regular, human (MYXREDLIN) 100 units/ 100 mL infusion (has no administration in time range)  lactated ringers infusion (has no administration in time range)  dextrose 5 % in lactated ringers infusion (has no administration in time range)  dextrose 50 % solution 0-50 mL (has no administration in time range)  guaiFENesin-dextromethorphan (ROBITUSSIN DM) 100-10 MG/5ML syrup 10 mL (has no administration in time range)  chlorpheniramine-HYDROcodone (TUSSIONEX) 10-8 MG/5ML suspension 5 mL (has no administration in time range)  acetaminophen (TYLENOL) tablet 650 mg (has no administration in time  range)  oxyCODONE (Oxy IR/ROXICODONE) immediate release tablet 5 mg (has no administration in time range)  lactated ringers bolus 2,000 mL (  0 mLs Intravenous Stopped 07/06/22 0124)  ketorolac (TORADOL) 15 MG/ML injection 15 mg (15 mg Intravenous Given 07/06/22 0009)  ondansetron (ZOFRAN) injection 4 mg (4 mg Intravenous Given 07/06/22 0009)    Vitals:   07/05/22 2335 07/05/22 2339 07/05/22 2345 07/06/22 0230  BP: 126/84  (!) 132/90 102/72  Pulse: (!) 128  (!) 127 (!) 120  Resp: (!) _0 Temp: 98 F (36.7 C)     TempSrc: Oral     SpO2: 100% 100% 100% 97%  Weight:      Height:        Final diagnoses:  Diabetic ketoacidosis without coma associated with type 1 diabetes mellitus ([place])    Admission/ observation were discussed with the admitting physician, patient and/or family and they are comfortable with the plan.           Final Clinical Impression(s) / ED Diagnoses Final diagnoses:  Diabetic ketoacidosis without coma associated with type 1 diabetes mellitus [facility])    Rx / DC Orders ED Discharge Orders     None         Deno Etienne, DO 07/06/22 8502

## 2022-07-06 NOTE — Progress Notes (Signed)
[facility] PLAN OF CARE NOTE Patient: Joseph Barr ZGY:[id number]   PCP: [name], NP DOB: [birth year]   DOA: 07/05/2022   DOS: 07/06/2022    Patient was admitted by my colleague earlier on 07/06/2022. I have reviewed the H&P as well as assessment and plan and agree with the same. Important changes in the plan are listed below.  Plan of care: Principal Problem:   Diabetic ketoacidosis without coma associated with type 1 diabetes mellitus ([place]) Active Problems:   AKI (acute kidney injury) ([place])   Hyperkalemia   COVID-19 virus infection   DKA (diabetic ketoacidosis) ([place]) Continue with IV fluids but Patient with poor p.o. intake better We will monitor closely overnight to ensure that the COVID infection which probably triggered the DKA remains under control. Currently no indication to initiate therapy and the patient does not want to initiate therapy as well for COVID-19 infection. Anion gap closed and therefore IV insulin therapy completed. Transfer to telemetry due to persistent tachycardia.  Level of care: Telemetry  Author: Berle Mull, MD [facility] 07/06/2022 6:28 PM   If 7PM-7AM, please contact night-coverage at [email or web address]

## 2022-07-06 NOTE — Inpatient Diabetes Management (Signed)
Inpatient Diabetes Program Recommendations  AACE/ADA: New Consensus Statement on Inpatient Glycemic Control (2015)  Target Ranges:  Prepandial:   less than 140 mg/dL      Peak postprandial:   less than 180 mg/dL (1-2 hours)      Critically ill patients:  140 - 180 mg/dL   Lab Results  Component Value Date   GLUCAP 180 (H) 07/06/2022   HGBA1C >15.5 (H) 03/05/2022    Latest Reference Range & Units 07/06/22 00:25  Sodium 135 - 145 mmol/L 135  Potassium 3.5 - 5.1 mmol/L 6.2 (H)  Chloride 98 - 111 mmol/L 100  CO2 22 - 32 mmol/L 8 (L)  Glucose 70 - 99 mg/dL 481 (H)  BUN 6 - 20 mg/dL 25 (H)  Creatinine 0.61 - 1.24 mg/dL 1.81 (H)  Calcium 8.9 - 10.3 mg/dL 9.0  Anion gap 5 - 15  27 (H)  (H): Data is abnormally high (L): Data is abnormally low  Diabetes history: DM1 Outpatient Diabetes medications: Lantus 18 BID, Humalog 5 units TID Current orders for Inpatient glycemic control: IV insulin  Inpatient Diabetes Program Recommendations:   Patient well known to DM inpatient team. Patient seen by DM coordinator 03/05/22 during previous admission (patient had ran out of insulin). When patient meets criteria for transition from IV insulin, please consider: -Semglee 18 units bid -Novolog 4 units tid meal coverage when eating 50% meal or greater -Novolog 0-9 units q 4 hrs if NPO, then tid + hs 0-5 units correction when eating  Thank you, Bethena Roys E. Betsabe Iglesia, RN, MSN, CDE  Diabetes Coordinator Inpatient Glycemic Control Team Team Pager [phone] (8am-5pm) 07/06/2022 8:28 AM

## 2022-07-07 ENCOUNTER — Other Ambulatory Visit: Payer: Self-pay

## 2022-07-07 DIAGNOSIS — E1010 Type 1 diabetes mellitus with ketoacidosis without coma: Secondary | ICD-10-CM | POA: Diagnosis not present

## 2022-07-07 LAB — CBC WITH DIFFERENTIAL/PLATELET
Abs Immature Granulocytes: 0.01 10*3/uL (ref 0.00–0.07)
Basophils Absolute: 0 10*3/uL (ref 0.0–0.1)
Basophils Relative: 1 %
Eosinophils Absolute: 0 10*3/uL (ref 0.0–0.5)
Eosinophils Relative: 0 %
HCT: 33.6 % — ABNORMAL LOW (ref 39.0–52.0)
Hemoglobin: 11.9 g/dL — ABNORMAL LOW (ref 13.0–17.0)
Immature Granulocytes: 0 %
Lymphocytes Relative: 41 %
Lymphs Abs: 1.9 10*3/uL (ref 0.7–4.0)
MCH: 30.1 pg (ref 26.0–34.0)
MCHC: 35.4 g/dL (ref 30.0–36.0)
MCV: 84.8 fL (ref 80.0–100.0)
Monocytes Absolute: 0.5 10*3/uL (ref 0.1–1.0)
Monocytes Relative: 10 %
Neutro Abs: 2.2 10*3/uL (ref 1.7–7.7)
Neutrophils Relative %: 48 %
Platelets: 207 10*3/uL (ref 150–400)
RBC: 3.96 MIL/uL — ABNORMAL LOW (ref 4.22–5.81)
RDW: 12.7 % (ref 11.5–15.5)
WBC: 4.6 10*3/uL (ref 4.0–10.5)
nRBC: 0 % (ref 0.0–0.2)

## 2022-07-07 LAB — BASIC METABOLIC PANEL
Anion gap: 10 (ref 5–15)
BUN: 10 mg/dL (ref 6–20)
CO2: 25 mmol/L (ref 22–32)
Calcium: 8.6 mg/dL — ABNORMAL LOW (ref 8.9–10.3)
Chloride: 103 mmol/L (ref 98–111)
Creatinine, Ser: 0.66 mg/dL (ref 0.61–1.24)
GFR, Estimated: >60 mL/min (ref >60)
Glucose, Bld: 55 mg/dL — ABNORMAL LOW (ref 70–99)
Potassium: 3.3 mmol/L — ABNORMAL LOW (ref 3.5–5.1)
Sodium: 138 mmol/L (ref 135–145)

## 2022-07-07 LAB — GLUCOSE, CAPILLARY
Glucose-Capillary: 48 mg/dL — ABNORMAL LOW (ref 70–99)
Glucose-Capillary: 50 mg/dL — ABNORMAL LOW (ref 70–99)
Glucose-Capillary: 136 mg/dL — ABNORMAL HIGH (ref 70–99)
Glucose-Capillary: 174 mg/dL — ABNORMAL HIGH (ref 70–99)

## 2022-07-07 LAB — C-REACTIVE PROTEIN: CRP: 3 mg/dL — ABNORMAL HIGH (ref <1.0)

## 2022-07-07 LAB — D-DIMER, QUANTITATIVE: D-Dimer, Quant: <0.27 ug/mL-FEU (ref 0.00–0.50)

## 2022-07-07 MED ORDER — POTASSIUM CHLORIDE CRYS ER 20 MEQ PO TBCR
40.0000 meq | EXTENDED_RELEASE_TABLET | ORAL | Status: AC
  Administered 2022-07-07 (×2): 40 meq via ORAL
  Filled 2022-07-07 (×2): qty 2

## 2022-07-07 NOTE — Progress Notes (Addendum)
AM CBG was 50. Standing hypoglycemia order implemented by this RN, and 1 cup orange juice was given with patient's breakfast. Repeat CBG was 136. AM insulin was not given per protocol. This RN will continue to carefully monitor patient's CBGs.

## 2022-07-07 NOTE — Progress Notes (Signed)
  Transition of Care [facility]) Screening Note   Patient Details  Name: Joseph Barr Date of Birth: [birth year]   Transition of Care [facility]) CM/SW Contact:    Vassie Moselle, [place] Phone Number: 07/07/2022, 12:15 PM    Transition of Care Department [facility]) has reviewed patient and no TOC needs have been identified at this time. We will continue to monitor patient advancement through interdisciplinary progression rounds. If new patient transition needs arise, please place a TOC consult.

## 2022-07-07 NOTE — Inpatient Diabetes Management (Signed)
Inpatient Diabetes Program Recommendations  AACE/ADA: New Consensus Statement on Inpatient Glycemic Control (2015)  Target Ranges:  Prepandial:   less than 140 mg/dL      Peak postprandial:   less than 180 mg/dL (1-2 hours)      Critically ill patients:  140 - 180 mg/dL   Lab Results  Component Value Date   GLUCAP 136 (H) 07/07/2022   HGBA1C >15.5 (H) 03/05/2022    Review of Glycemic Control  Diabetes history: DM1 Outpatient Diabetes medications: Lantus 18 BID, Humalog 5 TID Current orders for Inpatient glycemic control: Semglee 18 BID, Novolog 0-15 TID with meals and 0-5 HS + 4 units TID  HgbA1C - > 15.5%  Inpatient Diabetes Program Recommendations:    Consider decreasing Semglee to 15 units BID Decrease Novolog to 0-9 units TID with meals and 0-5 HS Please update HgbA1C - last one 03/05/22  Will continue to follow glycemic control trends.  Thank you. Lorenda Peck, RD, LDN, [place] Inpatient Diabetes Coordinator [phone]

## 2022-07-08 ENCOUNTER — Telehealth: Payer: Self-pay

## 2022-07-08 NOTE — Telephone Encounter (Signed)
Transition Care Management Follow-up Telephone Call Date of discharge and from where: [facility] 07/07/2022 How have you been since you were released from the hospital? better Any questions or concerns? No  Items Reviewed: Did the pt receive and understand the discharge instructions provided? Yes  Medications obtained and verified? Yes  Other? No  Any new allergies since your discharge? No  Dietary orders reviewed? Yes Do you have support at home? Yes   Home Care and Equipment/Supplies: Were home health services ordered? not applicable If so, what is the name of the agency? N/a  Has the agency set up a time to come to the patient's home? not applicable Were any new equipment or medical supplies ordered?  No What is the name of the medical supply agency? N/a Were you able to get the supplies/equipment? not applicable Do you have any questions related to the use of the equipment or supplies? No  Functional Questionnaire: (I = Independent and D = Dependent) ADLs: I  Bathing/Dressing- I  Meal Prep- I  Eating- I  Maintaining continence- I  Transferring/Ambulation- I  Managing Meds- I  Follow up appointments reviewed:  PCP Hospital f/u appt confirmed? No   no available appts. Will send message to staff to schedule [facility] Hospital f/u appt confirmed? No   Are transportation arrangements needed? No  If their condition worsens, is the pt aware to call PCP or go to the Emergency Dept.? Yes Was the patient provided with contact information for the PCP's office or ED? Yes Was to pt encouraged to call back with questions or concerns? Yes  Juanda Crumble, LPN [place] Direct Dial [phone]

## 2022-07-10 NOTE — Discharge Summary (Signed)
Physician Discharge Summary   Patient: Joseph Barr MRN: [id number] DOB: 1996/05/01  Admit date:     07/05/2022  Discharge date: 07/07/2022  Discharge Physician: Berle Mull  PCP: [name], NP  Recommendations at discharge:  Follow up with PCP as recommended   Follow-up Information     [name], NP. Schedule an appointment as soon as possible for a visit in 1 week(s).   Specialty: Nurse Practitioner Contact information: [place] [address] [phone]                Discharge Diagnoses: Principal Problem:   Diabetic ketoacidosis without coma associated with type 1 diabetes mellitus ([place]) Active Problems:   AKI (acute kidney injury) ([place])   Hyperkalemia   COVID-19 virus infection   DKA (diabetic ketoacidosis) ([place])  Assessment and Plan  1. DKA  A1c was >15.5% in July 2023  Treated with IV Fluid and started on IV insulin infusion DKA resolved Switched to home regimen. Blood sugars were reasonably controlled on home regimen. Conitnue on discharge.    2. Hyperkalemia; AKI  SCr is 1.81 on admission, up from baseline of 0.7 in setting of DKA and hypovolemia  Potassium is 6.2 in setting of insulin-deficiency and AKI  Resolved    3. COVID-19 infection  Sxs began 07/04/22  On room air in ED  Continue supportive care and isolation    Pt decided he would not take the medicine for now.   Consultants:  none  Procedures performed:  non  DISCHARGE MEDICATION: Allergies as of 07/07/2022   No Known Allergies      Medication List     TAKE these medications    Basaglar KwikPen 100 UNIT/ML Inject 18 Units into the skin 2 (two) times daily.   BD Veo Insulin Syringe U/F 31G X 15/64" 0.3 ML Misc Generic drug: Insulin Syringe-Needle U-100 Use to inject Humalog 3x daily. Keep upcoming appt for additional refills.   HumaLOG 100 UNIT/ML injection Generic drug: insulin lispro Inject 0.05 mLs (5 Units total) into the skin 3  (three) times daily with meals.   TechLite Pen Needles 31G X 5 MM Misc Generic drug: Insulin Pen Needle Use to inject Lantus twice daily.   True Metrix Blood Glucose Test test strip Generic drug: glucose blood Use as instructed   True Metrix Blood Glucose Test test strip Generic drug: glucose blood use as directed to test blood sugars four times a day   True Metrix Meter w/Device Kit Use as directed   TRUEplus Lancets 28G Misc use as directed to test blood sugars four times a day   Tylenol 325 MG tablet Generic drug: acetaminophen Take 650 mg by mouth every 6 (six) hours as needed for mild pain.       Disposition: Home Diet recommendation: Carb modified diet  Discharge Exam: Vitals:   07/07/22 0800 07/07/22 0947 07/07/22 1100 07/07/22 1200  BP:  112/72    Pulse:  91    Resp: _0 Temp:  98.8 F (37.1 C)    TempSrc:  Oral    SpO2:  99%    Weight:      Height:       General: Appear in no distress; no visible Abnormal Neck Mass Or lumps, Conjunctiva normal Cardiovascular: S1 and S2 Present, no Murmur, Respiratory: good respiratory effort, Bilateral Air entry present and CTA, no Crackles, no wheezes Abdomen: Bowel Sound present, Non tender  Extremities: no Pedal edema  Neurology: alert and oriented to time, place, and person  [facility] Weights   07/05/22 2334  Weight: 68 kg   Condition at discharge: stable  The results of significant diagnostics from this hospitalization (including imaging, microbiology, ancillary and laboratory) are listed below for reference.   Imaging Studies: DG Chest Port 1 View  Result Date: 07/06/2022 CLINICAL DATA:  Cough, fever, hyperglycemia and weakness. Recent COVID exposure via family member. EXAM: PORTABLE CHEST 1 VIEW COMPARISON:  Portable chest 12/10/2021 FINDINGS: The heart size and mediastinal contours are within normal limits. Both lungs are clear. The visualized skeletal structures are unremarkable. IMPRESSION: No acute  radiographic chest findings.  Stable chest. Electronically Signed   By: Telford Nab M.D.   On: 07/06/2022 00:14    Microbiology: Results for orders placed or performed during the hospital encounter of 07/05/22  Resp Panel by RT-PCR (Flu A&B, Covid) Anterior Nasal Swab     Status: Abnormal   Collection Time: 07/05/22 11:56 PM   Specimen: Anterior Nasal Swab  Result Value Ref Range Status   SARS Coronavirus 2 by RT PCR POSITIVE (A) NEGATIVE Final    Comment: (NOTE) SARS-CoV-2 target nucleic acids are DETECTED.  The SARS-CoV-2 RNA is generally detectable in upper respiratory specimens during the acute phase of infection. Positive results are indicative of the presence of the identified virus, but do not rule out bacterial infection or co-infection with other pathogens not detected by the test. Clinical correlation with patient history and other diagnostic information is necessary to determine patient infection status. The expected result is Negative.  Fact Sheet for Patients: EntrepreneurPulse.com.au  Fact Sheet for Healthcare Providers: IncredibleEmployment.be  This test is not yet approved or cleared by the [place] FDA and  has been authorized for detection and/or diagnosis of SARS-CoV-2 by FDA under an Emergency Use Authorization (EUA).  This EUA will remain in effect (meaning this test can be used) for the duration of  the COVID-19 declaration under Section 564(b)(1) of the A ct, 21 U.S.C. section 360bbb-3(b)(1), unless the authorization is terminated or revoked sooner.     Influenza A by PCR NEGATIVE NEGATIVE Final   Influenza B by PCR NEGATIVE NEGATIVE Final    Comment: (NOTE) The Xpert Xpress SARS-CoV-2/FLU/RSV plus assay is intended as an aid in the diagnosis of influenza from Nasopharyngeal swab specimens and should not be used as a sole basis for treatment. Nasal washings and aspirates are unacceptable for Xpert Xpress  SARS-CoV-2/FLU/RSV testing.  Fact Sheet for Patients: EntrepreneurPulse.com.au  Fact Sheet for Healthcare Providers: IncredibleEmployment.be  This test is not yet approved or cleared by the [place] FDA and has been authorized for detection and/or diagnosis of SARS-CoV-2 by FDA under an Emergency Use Authorization (EUA). This EUA will remain in effect (meaning this test can be used) for the duration of the COVID-19 declaration under Section 564(b)(1) of the Act, 21 U.S.C. section 360bbb-3(b)(1), unless the authorization is terminated or revoked.  Performed at [facility], [place] [address] [place], [place] [address]    Labs: CBC: Recent Labs  Lab 07/06/22 0025 07/07/22 0519  WBC 8.6 4.6  NEUTROABS 6.2 2.2  HGB 14.8 11.9*  HCT 45.0 33.6*  MCV 89.5 84.8  PLT 269 509   Basic Metabolic Panel: Recent Labs  Lab 07/06/22 0330 07/06/22 0705 07/06/22 0850 07/06/22 1546 07/07/22 0519  NA 139 136 135 138 138  K 4.5 4.6 4.4 4.0 3.3*  CL 108 106 106 105 103  CO2 13* 18* 21*  23 25  GLUCOSE 264* 194* 171* 163* 55*  BUN 23* 21* _0 CREATININE 1.58* 1.30* 0.93 0.97 0.66  CALCIUM 8.7* 8.6* 8.4* 8.8* 8.6*   Liver Function Tests: Recent Labs  Lab 07/06/22 0025  AST 31  ALT 21  ALKPHOS 73  BILITOT 1.5*  PROT 8.4*  ALBUMIN 4.5   CBG: Recent Labs  Lab 07/06/22 2208 07/07/22 0739 07/07/22 0741 07/07/22 0830 07/07/22 1218  GLUCAP 82 48* 50* 136* 174*    Discharge time spent: greater than 30 minutes.  Signed: Berle Mull, MD [facility] 07/07/2022

## 2022-09-02 ENCOUNTER — Other Ambulatory Visit: Payer: Self-pay

## 2022-09-03 ENCOUNTER — Inpatient Hospital Stay: Payer: 59 | Admitting: Nurse Practitioner

## 2022-09-24 ENCOUNTER — Inpatient Hospital Stay: Payer: 59 | Admitting: Nurse Practitioner

## 2022-12-01 ENCOUNTER — Other Ambulatory Visit: Payer: Self-pay

## 2022-12-03 ENCOUNTER — Other Ambulatory Visit: Payer: Self-pay

## 2022-12-18 ENCOUNTER — Encounter (HOSPITAL_COMMUNITY): Payer: Self-pay

## 2022-12-18 ENCOUNTER — Emergency Department (HOSPITAL_COMMUNITY)
Admission: EM | Admit: 2022-12-18 | Discharge: 2022-12-18 | Disposition: A | Discharge status: Home / Self Care | Payer: Medicaid Other | Attending: Emergency Medicine | Admitting: Emergency Medicine

## 2022-12-18 ENCOUNTER — Emergency Department (HOSPITAL_COMMUNITY): Payer: Medicaid Other

## 2022-12-18 ENCOUNTER — Other Ambulatory Visit: Payer: Self-pay

## 2022-12-18 DIAGNOSIS — E1065 Type 1 diabetes mellitus with hyperglycemia: Secondary | ICD-10-CM | POA: Diagnosis not present

## 2022-12-18 DIAGNOSIS — R739 Hyperglycemia, unspecified: Secondary | ICD-10-CM

## 2022-12-18 DIAGNOSIS — R0602 Shortness of breath: Secondary | ICD-10-CM | POA: Insufficient documentation

## 2022-12-18 DIAGNOSIS — E1165 Type 2 diabetes mellitus with hyperglycemia: Secondary | ICD-10-CM | POA: Diagnosis not present

## 2022-12-18 DIAGNOSIS — N2889 Other specified disorders of kidney and ureter: Secondary | ICD-10-CM | POA: Diagnosis not present

## 2022-12-18 DIAGNOSIS — R519 Headache, unspecified: Secondary | ICD-10-CM | POA: Diagnosis not present

## 2022-12-18 DIAGNOSIS — M542 Cervicalgia: Secondary | ICD-10-CM | POA: Insufficient documentation

## 2022-12-18 DIAGNOSIS — Y92410 Unspecified street and highway as the place of occurrence of the external cause: Secondary | ICD-10-CM | POA: Insufficient documentation

## 2022-12-18 DIAGNOSIS — R079 Chest pain, unspecified: Secondary | ICD-10-CM | POA: Diagnosis not present

## 2022-12-18 DIAGNOSIS — T1490XA Injury, unspecified, initial encounter: Secondary | ICD-10-CM | POA: Diagnosis not present

## 2022-12-18 DIAGNOSIS — R0789 Other chest pain: Secondary | ICD-10-CM | POA: Diagnosis not present

## 2022-12-18 LAB — CBC WITH DIFFERENTIAL/PLATELET
Abs Immature Granulocytes: 0.02 10*3/uL (ref 0.00–0.07)
Basophils Absolute: 0 10*3/uL (ref 0.0–0.1)
Basophils Relative: 1 %
Eosinophils Absolute: 0.1 10*3/uL (ref 0.0–0.5)
Eosinophils Relative: 2 %
HCT: 31.1 % — ABNORMAL LOW (ref 39.0–52.0)
Hemoglobin: 10.9 g/dL — ABNORMAL LOW (ref 13.0–17.0)
Immature Granulocytes: 0 %
Lymphocytes Relative: 36 %
Lymphs Abs: 1.8 10*3/uL (ref 0.7–4.0)
MCH: 29.4 pg (ref 26.0–34.0)
MCHC: 35 g/dL (ref 30.0–36.0)
MCV: 83.8 fL (ref 80.0–100.0)
Monocytes Absolute: 0.4 10*3/uL (ref 0.1–1.0)
Monocytes Relative: 8 %
Neutro Abs: 2.6 10*3/uL (ref 1.7–7.7)
Neutrophils Relative %: 53 %
Platelets: 244 10*3/uL (ref 150–400)
RBC: 3.71 MIL/uL — ABNORMAL LOW (ref 4.22–5.81)
RDW: 11.7 % (ref 11.5–15.5)
WBC: 4.9 10*3/uL (ref 4.0–10.5)
nRBC: 0 % (ref 0.0–0.2)

## 2022-12-18 LAB — BASIC METABOLIC PANEL
Anion gap: 8 (ref 5–15)
BUN: 25 mg/dL — ABNORMAL HIGH (ref 6–20)
CO2: 24 mmol/L (ref 22–32)
Calcium: 8.4 mg/dL — ABNORMAL LOW (ref 8.9–10.3)
Chloride: 91 mmol/L — ABNORMAL LOW (ref 98–111)
Creatinine, Ser: 1.12 mg/dL (ref 0.61–1.24)
GFR, Estimated: >60 mL/min (ref >60)
Glucose, Bld: 731 mg/dL (ref 70–99)
Potassium: 4.6 mmol/L (ref 3.5–5.1)
Sodium: 123 mmol/L — ABNORMAL LOW (ref 135–145)

## 2022-12-18 LAB — TROPONIN I (HIGH SENSITIVITY)
Troponin I (High Sensitivity): <2 ng/L (ref <18)
Troponin I (High Sensitivity): <2 ng/L (ref <18)

## 2022-12-18 LAB — LIPASE, BLOOD: Lipase: 30 U/L (ref 11–51)

## 2022-12-18 LAB — CBG MONITORING, ED: Glucose-Capillary: 462 mg/dL — ABNORMAL HIGH (ref 70–99)

## 2022-12-18 MED ORDER — ACETAMINOPHEN 500 MG PO TABS
1000.0000 mg | ORAL_TABLET | Freq: Once | ORAL | Status: AC
  Administered 2022-12-18: 1000 mg via ORAL
  Filled 2022-12-18: qty 2

## 2022-12-18 MED ORDER — OXYCODONE HCL 5 MG PO TABS
5.0000 mg | ORAL_TABLET | Freq: Once | ORAL | Status: AC
  Administered 2022-12-18: 5 mg via ORAL
  Filled 2022-12-18: qty 1

## 2022-12-18 MED ORDER — LACTATED RINGERS IV BOLUS
2000.0000 mL | Freq: Once | INTRAVENOUS | Status: AC
  Administered 2022-12-18: 2000 mL via INTRAVENOUS

## 2022-12-18 MED ORDER — IOHEXOL 300 MG/ML  SOLN
100.0000 mL | Freq: Once | INTRAMUSCULAR | Status: AC | PRN
  Administered 2022-12-18: 100 mL via INTRAVENOUS

## 2022-12-18 MED ORDER — INSULIN ASPART PROT & ASPART (70-30 MIX) 100 UNIT/ML ~~LOC~~ SUSP
10.0000 [IU] | Freq: Once | SUBCUTANEOUS | Status: AC
  Administered 2022-12-18: 10 [IU] via SUBCUTANEOUS
  Filled 2022-12-18: qty 10
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
## 2023-01-15 ENCOUNTER — Other Ambulatory Visit: Payer: Self-pay

## 2023-01-15 ENCOUNTER — Emergency Department (HOSPITAL_COMMUNITY): Payer: Medicaid Other

## 2023-01-15 ENCOUNTER — Encounter (HOSPITAL_COMMUNITY): Payer: Self-pay

## 2023-01-15 ENCOUNTER — Inpatient Hospital Stay (HOSPITAL_COMMUNITY)
Admission: EM | Admit: 2023-01-15 | Discharge: 2023-01-17 | DRG: 638 | Disposition: A | Discharge status: Home / Self Care | Payer: Medicaid Other | Attending: Internal Medicine | Admitting: Internal Medicine

## 2023-01-15 DIAGNOSIS — E1010 Type 1 diabetes mellitus with ketoacidosis without coma: Principal | ICD-10-CM | POA: Diagnosis present

## 2023-01-15 DIAGNOSIS — R636 Underweight: Secondary | ICD-10-CM | POA: Diagnosis not present

## 2023-01-15 DIAGNOSIS — Z1152 Encounter for screening for COVID-19: Secondary | ICD-10-CM | POA: Diagnosis not present

## 2023-01-15 DIAGNOSIS — E1110 Type 2 diabetes mellitus with ketoacidosis without coma: Secondary | ICD-10-CM | POA: Diagnosis present

## 2023-01-15 DIAGNOSIS — K861 Other chronic pancreatitis: Secondary | ICD-10-CM | POA: Diagnosis present

## 2023-01-15 DIAGNOSIS — N179 Acute kidney failure, unspecified: Secondary | ICD-10-CM | POA: Diagnosis not present

## 2023-01-15 DIAGNOSIS — K859 Acute pancreatitis without necrosis or infection, unspecified: Secondary | ICD-10-CM

## 2023-01-15 DIAGNOSIS — Z794 Long term (current) use of insulin: Secondary | ICD-10-CM

## 2023-01-15 DIAGNOSIS — F419 Anxiety disorder, unspecified: Secondary | ICD-10-CM | POA: Diagnosis not present

## 2023-01-15 DIAGNOSIS — Z681 Body mass index (BMI) 19 or less, adult: Secondary | ICD-10-CM

## 2023-01-15 DIAGNOSIS — Z720 Tobacco use: Secondary | ICD-10-CM | POA: Diagnosis present

## 2023-01-15 DIAGNOSIS — R1111 Vomiting without nausea: Secondary | ICD-10-CM | POA: Diagnosis not present

## 2023-01-15 DIAGNOSIS — R1110 Vomiting, unspecified: Secondary | ICD-10-CM | POA: Diagnosis present

## 2023-01-15 DIAGNOSIS — E876 Hypokalemia: Secondary | ICD-10-CM | POA: Diagnosis present

## 2023-01-15 DIAGNOSIS — R079 Chest pain, unspecified: Secondary | ICD-10-CM | POA: Diagnosis not present

## 2023-01-15 DIAGNOSIS — R0902 Hypoxemia: Secondary | ICD-10-CM | POA: Diagnosis not present

## 2023-01-15 DIAGNOSIS — R000 Tachycardia, unspecified: Secondary | ICD-10-CM | POA: Diagnosis not present

## 2023-01-15 DIAGNOSIS — E860 Dehydration: Secondary | ICD-10-CM | POA: Diagnosis not present

## 2023-01-15 DIAGNOSIS — Z8249 Family history of ischemic heart disease and other diseases of the circulatory system: Secondary | ICD-10-CM | POA: Diagnosis not present

## 2023-01-15 DIAGNOSIS — R110 Nausea: Secondary | ICD-10-CM | POA: Diagnosis not present

## 2023-01-15 DIAGNOSIS — R112 Nausea with vomiting, unspecified: Secondary | ICD-10-CM | POA: Diagnosis not present

## 2023-01-15 LAB — BETA-HYDROXYBUTYRIC ACID: Beta-Hydroxybutyric Acid: >8 mmol/L — ABNORMAL HIGH (ref 0.05–0.27)

## 2023-01-15 LAB — URINALYSIS, ROUTINE W REFLEX MICROSCOPIC
Bilirubin Urine: NEGATIVE
Glucose, UA: >=500 mg/dL — AB
Hgb urine dipstick: NEGATIVE
Ketones, ur: 80 mg/dL — AB
Leukocytes,Ua: NEGATIVE
Nitrite: NEGATIVE
Protein, ur: 100 mg/dL — AB
Specific Gravity, Urine: 1.025 (ref 1.005–1.030)
pH: 5 (ref 5.0–8.0)

## 2023-01-15 LAB — COMPREHENSIVE METABOLIC PANEL
ALT: 32 U/L (ref 0–44)
AST: 28 U/L (ref 15–41)
Albumin: 3.9 g/dL (ref 3.5–5.0)
Alkaline Phosphatase: 88 U/L (ref 38–126)
Anion gap: 17 — ABNORMAL HIGH (ref 5–15)
BUN: 19 mg/dL (ref 6–20)
CO2: 22 mmol/L (ref 22–32)
Calcium: 9.3 mg/dL (ref 8.9–10.3)
Chloride: 100 mmol/L (ref 98–111)
Creatinine, Ser: 1.53 mg/dL — ABNORMAL HIGH (ref 0.61–1.24)
GFR, Estimated: >60 mL/min (ref >60)
Glucose, Bld: 267 mg/dL — ABNORMAL HIGH (ref 70–99)
Potassium: 4.8 mmol/L (ref 3.5–5.1)
Sodium: 139 mmol/L (ref 135–145)
Total Bilirubin: 1.1 mg/dL (ref 0.3–1.2)
Total Protein: 7.2 g/dL (ref 6.5–8.1)

## 2023-01-15 LAB — CBC
HCT: 41.8 % (ref 39.0–52.0)
HCT: 40.7 % (ref 39.0–52.0)
Hemoglobin: 14 g/dL (ref 13.0–17.0)
Hemoglobin: 13.3 g/dL (ref 13.0–17.0)
MCH: 29.5 pg (ref 26.0–34.0)
MCH: 29.2 pg (ref 26.0–34.0)
MCHC: 33.5 g/dL (ref 30.0–36.0)
MCHC: 32.7 g/dL (ref 30.0–36.0)
MCV: 88.2 fL (ref 80.0–100.0)
MCV: 89.3 fL (ref 80.0–100.0)
Platelets: 396 10*3/uL (ref 150–400)
Platelets: 361 10*3/uL (ref 150–400)
RBC: 4.74 MIL/uL (ref 4.22–5.81)
RBC: 4.56 MIL/uL (ref 4.22–5.81)
RDW: 12.4 % (ref 11.5–15.5)
RDW: 12.5 % (ref 11.5–15.5)
WBC: 11.3 10*3/uL — ABNORMAL HIGH (ref 4.0–10.5)
WBC: 11.7 10*3/uL — ABNORMAL HIGH (ref 4.0–10.5)
nRBC: 0 % (ref 0.0–0.2)
nRBC: 0 % (ref 0.0–0.2)

## 2023-01-15 LAB — I-STAT VENOUS BLOOD GAS, ED
Acid-base deficit: 4 mmol/L — ABNORMAL HIGH (ref 0.0–2.0)
Bicarbonate: 23.1 mmol/L (ref 20.0–28.0)
Calcium, Ion: 1.22 mmol/L (ref 1.15–1.40)
HCT: 44 % (ref 39.0–52.0)
Hemoglobin: 15 g/dL (ref 13.0–17.0)
O2 Saturation: 41 %
Potassium: 5.3 mmol/L — ABNORMAL HIGH (ref 3.5–5.1)
Sodium: 136 mmol/L (ref 135–145)
TCO2: 24 mmol/L (ref 22–32)
pCO2, Ven: 46.7 mmHg (ref 44–60)
pH, Ven: 7.302 (ref 7.25–7.43)
pO2, Ven: 26 mmHg — CL (ref 32–45)

## 2023-01-15 LAB — CBG MONITORING, ED
Glucose-Capillary: 239 mg/dL — ABNORMAL HIGH (ref 70–99)
Glucose-Capillary: 474 mg/dL — ABNORMAL HIGH (ref 70–99)
Glucose-Capillary: 508 mg/dL (ref 70–99)
Glucose-Capillary: 384 mg/dL — ABNORMAL HIGH (ref 70–99)
Glucose-Capillary: 252 mg/dL — ABNORMAL HIGH (ref 70–99)

## 2023-01-15 LAB — ETHANOL: Alcohol, Ethyl (B): <10 mg/dL (ref <10)

## 2023-01-15 LAB — GLUCOSE, CAPILLARY
Glucose-Capillary: 144 mg/dL — ABNORMAL HIGH (ref 70–99)
Glucose-Capillary: 220 mg/dL — ABNORMAL HIGH (ref 70–99)
Glucose-Capillary: 221 mg/dL — ABNORMAL HIGH (ref 70–99)

## 2023-01-15 LAB — RAPID URINE DRUG SCREEN, HOSP PERFORMED
Amphetamines: NOT DETECTED
Barbiturates: NOT DETECTED
Benzodiazepines: NOT DETECTED
Cocaine: NOT DETECTED
Opiates: NOT DETECTED
Tetrahydrocannabinol: POSITIVE — AB

## 2023-01-15 LAB — OSMOLALITY: Osmolality: 347 mOsm/kg (ref 275–295)

## 2023-01-15 LAB — BASIC METABOLIC PANEL
Anion gap: 20 — ABNORMAL HIGH (ref 5–15)
BUN: 20 mg/dL (ref 6–20)
CO2: 20 mmol/L — ABNORMAL LOW (ref 22–32)
Calcium: 9.6 mg/dL (ref 8.9–10.3)
Chloride: 102 mmol/L (ref 98–111)
Creatinine, Ser: 1.84 mg/dL — ABNORMAL HIGH (ref 0.61–1.24)
GFR, Estimated: 51 mL/min — ABNORMAL LOW (ref >60)
Glucose, Bld: 390 mg/dL — ABNORMAL HIGH (ref 70–99)
Potassium: 4.1 mmol/L (ref 3.5–5.1)
Sodium: 142 mmol/L (ref 135–145)

## 2023-01-15 LAB — TROPONIN I (HIGH SENSITIVITY)
Troponin I (High Sensitivity): 9 ng/L (ref <18)
Troponin I (High Sensitivity): 5 ng/L (ref <18)

## 2023-01-15 LAB — MAGNESIUM: Magnesium: 2 mg/dL (ref 1.7–2.4)

## 2023-01-15 LAB — LIPASE, BLOOD: Lipase: 419 U/L — ABNORMAL HIGH (ref 11–51)

## 2023-01-15 MED ORDER — HYDROCODONE-ACETAMINOPHEN 5-325 MG PO TABS
1.0000 | ORAL_TABLET | Freq: Once | ORAL | Status: AC
  Administered 2023-01-15: 1 via ORAL
  Filled 2023-01-15: qty 1

## 2023-01-15 MED ORDER — ONDANSETRON 4 MG PO TBDP
8.0000 mg | ORAL_TABLET | Freq: Once | ORAL | Status: AC
  Administered 2023-01-15: 8 mg via ORAL
  Filled 2023-01-15: qty 2

## 2023-01-15 MED ORDER — IOHEXOL 350 MG/ML SOLN
75.0000 mL | Freq: Once | INTRAVENOUS | Status: AC | PRN
  Administered 2023-01-15: 75 mL via INTRAVENOUS

## 2023-01-15 MED ORDER — DEXTROSE 50 % IV SOLN: 0.0000 mL | INTRAVENOUS | Status: DC | PRN

## 2023-01-15 MED ORDER — ONDANSETRON HCL 4 MG PO TABS: 4.0000 mg | ORAL_TABLET | Freq: Four times a day (QID) | ORAL | Status: DC | PRN

## 2023-01-15 MED ORDER — ENOXAPARIN SODIUM 40 MG/0.4ML IJ SOSY
40.0000 mg | PREFILLED_SYRINGE | INTRAMUSCULAR | Status: DC
  Administered 2023-01-15 – 2023-01-16 (×2): 40 mg via SUBCUTANEOUS
  Filled 2023-01-15 (×2): qty 0.4

## 2023-01-15 MED ORDER — BISACODYL 5 MG PO TBEC: 5.0000 mg | DELAYED_RELEASE_TABLET | Freq: Every day | ORAL | Status: DC | PRN

## 2023-01-15 MED ORDER — ACETAMINOPHEN 650 MG RE SUPP: 650.0000 mg | Freq: Four times a day (QID) | RECTAL | Status: DC | PRN

## 2023-01-15 MED ORDER — DEXTROSE IN LACTATED RINGERS 5 % IV SOLN: INTRAVENOUS | Status: AC

## 2023-01-15 MED ORDER — LACTATED RINGERS IV SOLN: INTRAVENOUS | Status: DC

## 2023-01-15 MED ORDER — INSULIN REGULAR(HUMAN) IN NACL 100-0.9 UT/100ML-% IV SOLN
INTRAVENOUS | Status: AC
  Administered 2023-01-15: 8 [IU]/h via INTRAVENOUS
  Filled 2023-01-15: qty 100

## 2023-01-15 MED ORDER — SODIUM CHLORIDE 0.9 % IV SOLN: INTRAVENOUS | Status: DC

## 2023-01-15 MED ORDER — LACTATED RINGERS IV BOLUS
1000.0000 mL | INTRAVENOUS | Status: AC
  Administered 2023-01-15 (×2): 1000 mL via INTRAVENOUS

## 2023-01-15 MED ORDER — ACETAMINOPHEN 325 MG PO TABS: 650.0000 mg | ORAL_TABLET | Freq: Four times a day (QID) | ORAL | Status: DC | PRN

## 2023-01-15 MED ORDER — ONDANSETRON HCL 4 MG/2ML IJ SOLN
4.0000 mg | Freq: Four times a day (QID) | INTRAMUSCULAR | Status: DC | PRN
  Administered 2023-01-15: 4 mg via INTRAVENOUS
  Filled 2023-01-15: qty 2

## 2023-01-15 NOTE — ED Notes (Signed)
Pt was seen in the floor after the bathroom light was checked since it was on. He was seen laying on the floor & after woken up he said I was "vomiting blood" & that was the last he remembers before this RN woke him. He also said "I did not fall." The toilet was flushed & this RN was unable to assess what he reported. By the time this RN came back with a w/c to assist him up he had gotten out of the floor by himself. PA in triage was informed & was pulled back in triage for re-eval.

## 2023-01-15 NOTE — ED Notes (Signed)
Called report to Ashley RN.

## 2023-01-15 NOTE — ED Provider Triage Note (Signed)
Emergency Medicine Provider Triage Evaluation Note  Joseph Barr , a 27 y.o. male  was evaluated in triage.  Pt complains of chest pain, nausea, and vomiting. Symptoms began Tuesday with chest pain non localized throughout that has been persistent since. Vomiting began soon after. Endorses mild epigastric abdominal pain as well. Blood sugars have been high. Is taking his home insulin. Denies cardiac history or history of similar symptoms previously. Denies shortness of breath.   Review of Systems  Positive:  Negative:   Physical Exam  BP 109/86 (BP Location: Left Arm)   Pulse (!) 131   Temp 98.1 F (36.7 C) (Oral)   Resp 18   Ht 6' (1.829 m)   Wt 68 kg   SpO2 98%   BMI 20.33 kg/m  Gen:   Awake, no distress   Resp:  Normal effort  MSK:   Moves extremities without difficulty  Other:  Epigastric TTP, chest TTP  Medical Decision Making  Medically screening exam initiated at 2:01 PM.  Appropriate orders placed.  Joseph Barr was informed that the remainder of the evaluation will be completed by another provider, this initial triage assessment does not replace that evaluation, and the importance of remaining in the ED until their evaluation is complete.     Silva Bandy, PA-C 01/15/23 1404

## 2023-01-15 NOTE — ED Provider Notes (Signed)
[facility] AT [facility] Provider Note   CSN: [id number] Arrival date & time: 01/15/23  1326     History  Chief Complaint  Patient presents with   Emesis    Joseph MALECKI is a 27 y.o. male.  With a history of type 1 diabetes who presents to the ED for evaluation of nausea, vomiting, chest pain.  Symptoms began on Tuesday and have progressively gotten worse.  States it feels similar to when he has been in DKA in the past, however is worse this time.  Blood sugar at home is typically between 100 and 200.  Since symptoms began and has been approximately 300.  He has been taking his insulin as prescribed.  He denies abdominal pain or diarrhea.  Denies fevers or chills.  Denies hematemesis.  He has been unable to eat since Tuesday as well.  He does report some central chest pain which is worse when vomiting.   Emesis      Home Medications Prior to Admission medications   Medication Sig Start Date End Date Taking? Authorizing Provider  acetaminophen (TYLENOL) 325 MG tablet Take 650 mg by mouth every 6 (six) hours as needed for mild pain.    [provider]  Blood Glucose Monitoring Suppl (TRUE METRIX METER) w/Device KIT Use as directed 03/06/22   Lonia Blood, MD  glucose blood (TRUE METRIX BLOOD GLUCOSE TEST) test strip Use as instructed 01/08/21   Rema Fendt, NP  glucose blood test strip use as directed to test blood sugars four times a day 03/06/22   Lonia Blood, MD  insulin glargine (LANTUS) 100 UNIT/ML Solostar Pen Inject 18 Units into the skin 2 (two) times daily. 03/06/22   Lonia Blood, MD  insulin lispro (HUMALOG) 100 UNIT/ML injection Inject 0.05 mLs (5 Units total) into the skin 3 (three) times daily with meals. 03/06/22   Lonia Blood, MD  Insulin Pen Needle 31G X 5 MM MISC Use to inject Lantus twice daily. 03/06/22   Lonia Blood, MD  Insulin Syringe-Needle U-100 31G X 15/64" 0.3 ML MISC Use to inject Humalog  3x daily. Keep upcoming appt for additional refills. 03/06/22   Lonia Blood, MD  TRUEplus Lancets 28G MISC use as directed to test blood sugars four times a day 03/06/22   Lonia Blood, MD      Allergies    Patient has no known allergies.    Review of Systems   Review of Systems  Cardiovascular:  Positive for chest pain.  Gastrointestinal:  Positive for nausea and vomiting.  All other systems reviewed and are negative.   Physical Exam Updated Vital Signs BP (!) 130/99   Pulse (!) 132   Temp 98 F (36.7 C) (Oral)   Resp (!) 25   Ht 6' (1.829 m)   Wt 68 kg   SpO2 100%   BMI 20.33 kg/m  Physical Exam Vitals and nursing note reviewed.  Constitutional:      General: He is in acute distress (Mild).     Appearance: He is well-developed. He is not ill-appearing, toxic-appearing or diaphoretic.  HENT:     Head: Normocephalic and atraumatic.  Eyes:     Conjunctiva/sclera: Conjunctivae normal.  Cardiovascular:     Rate and Rhythm: Normal rate and regular rhythm.     Heart sounds: No murmur heard. Pulmonary:     Effort: Pulmonary effort is normal. No respiratory distress.  Breath sounds: Normal breath sounds. No wheezing, rhonchi or rales.  Abdominal:     Palpations: Abdomen is soft.     Tenderness: There is no abdominal tenderness. There is no guarding.  Musculoskeletal:        General: No swelling.     Cervical back: Neck supple.  Skin:    General: Skin is warm and dry.     Capillary Refill: Capillary refill takes less than 2 seconds.  Neurological:     General: No focal deficit present.     Mental Status: He is alert and oriented to person, place, and time.  Psychiatric:        Mood and Affect: Mood normal.        Behavior: Behavior normal.     ED Results / Procedures / Treatments   Labs (all labs ordered are listed, but only abnormal results are displayed) Labs Reviewed  LIPASE, BLOOD - Abnormal; Notable for the following components:      Result  Value   Lipase 419 (*)    All other components within normal limits  COMPREHENSIVE METABOLIC PANEL - Abnormal; Notable for the following components:   Glucose, Bld 267 (*)    Creatinine, Ser 1.53 (*)    Anion gap 17 (*)    All other components within normal limits  CBC - Abnormal; Notable for the following components:   WBC 11.3 (*)    All other components within normal limits  URINALYSIS, ROUTINE W REFLEX MICROSCOPIC - Abnormal; Notable for the following components:   Glucose, UA >=500 (*)    Ketones, ur 80 (*)    Protein, ur 100 (*)    Bacteria, UA RARE (*)    All other components within normal limits  BETA-HYDROXYBUTYRIC ACID - Abnormal; Notable for the following components:   Beta-Hydroxybutyric Acid >8.00 (*)    All other components within normal limits  OSMOLALITY - Abnormal; Notable for the following components:   Osmolality 347 (*)    All other components within normal limits  CBG MONITORING, ED - Abnormal; Notable for the following components:   Glucose-Capillary 239 (*)    All other components within normal limits  I-STAT VENOUS BLOOD GAS, ED - Abnormal; Notable for the following components:   pO2, Ven 26 (*)    Acid-base deficit 4.0 (*)    Potassium 5.3 (*)    All other components within normal limits  CBG MONITORING, ED - Abnormal; Notable for the following components:   Glucose-Capillary 508 (*)    All other components within normal limits  CBG MONITORING, ED - Abnormal; Notable for the following components:   Glucose-Capillary 474 (*)    All other components within normal limits  CBG MONITORING, ED - Abnormal; Notable for the following components:   Glucose-Capillary 384 (*)    All other components within normal limits  ETHANOL  RAPID URINE DRUG SCREEN, HOSP PERFORMED  CBC  CREATININE, SERUM  MAGNESIUM  MAGNESIUM  BASIC METABOLIC PANEL  BASIC METABOLIC PANEL  BASIC METABOLIC PANEL  CBC  TROPONIN I (HIGH SENSITIVITY)  TROPONIN I (HIGH SENSITIVITY)     EKG None  Radiology CT ABDOMEN PELVIS W CONTRAST  Result Date: 01/15/2023 CLINICAL DATA:  Nausea vomiting EXAM: CT ABDOMEN AND PELVIS WITH CONTRAST TECHNIQUE: Multidetector CT imaging of the abdomen and pelvis was performed using the standard protocol following bolus administration of intravenous contrast. RADIATION DOSE REDUCTION: This exam was performed according to the departmental dose-optimization program which includes automated exposure control, adjustment of  the mA and/or kV according to patient size and/or use of iterative reconstruction technique. CONTRAST:  75mL OMNIPAQUE IOHEXOL 350 MG/ML SOLN COMPARISON:  CT 12/18/2022 FINDINGS: Lower chest: No acute abnormality. Hepatobiliary: No focal liver abnormality is seen. No gallstones, gallbladder wall thickening, or biliary dilatation. Pancreas: Unremarkable. No pancreatic ductal dilatation or surrounding inflammatory changes. Spleen: Normal in size without focal abnormality. Adrenals/Urinary Tract: Adrenal glands are unremarkable. Kidneys are normal, without renal calculi, focal lesion, or hydronephrosis. Bladder is unremarkable. Stomach/Bowel: Stomach is within normal limits. No evidence of bowel wall thickening, distention, or inflammatory changes. Vascular/Lymphatic: No significant vascular findings are present. No enlarged abdominal or pelvic lymph nodes. Reproductive: Prostate is unremarkable. Other: No abdominal wall hernia or abnormality. No abdominopelvic ascites. Musculoskeletal: No acute or significant osseous findings. IMPRESSION: No CT evidence for acute abnormality in the abdomen or pelvis. Electronically Signed   By: Jasmine Pang M.D.   On: 01/15/2023 17:46   DG Chest 2 View  Result Date: 01/15/2023 CLINICAL DATA:  Chest pain EXAM: CHEST - 2 VIEW COMPARISON:  Chest radiograph dated July 06, 2022 FINDINGS: The heart size and mediastinal contours are within normal limits. Both lungs are clear. The visualized skeletal structures  are unremarkable. IMPRESSION: No active cardiopulmonary disease. Electronically Signed   By: Larose Hires D.O.   On: 01/15/2023 14:57    Procedures Procedures    Medications Ordered in ED Medications  insulin regular, human (MYXREDLIN) 100 units/ 100 mL infusion (4 Units/hr Intravenous Rate/Dose Change 01/15/23 2009)  lactated ringers infusion (has no administration in time range)  dextrose 5 % in lactated ringers infusion (has no administration in time range)  dextrose 50 % solution 0-50 mL (has no administration in time range)  0.9 %  sodium chloride infusion (has no administration in time range)  enoxaparin (LOVENOX) injection 40 mg (has no administration in time range)  ondansetron (ZOFRAN) tablet 4 mg (has no administration in time range)    Or  ondansetron (ZOFRAN) injection 4 mg (has no administration in time range)  bisacodyl (DULCOLAX) EC tablet 5 mg (has no administration in time range)  acetaminophen (TYLENOL) tablet 650 mg (has no administration in time range)    Or  acetaminophen (TYLENOL) suppository 650 mg (has no administration in time range)  ondansetron (ZOFRAN-ODT) disintegrating tablet 8 mg (8 mg Oral Given 01/15/23 1815)  HYDROcodone-acetaminophen (NORCO/VICODIN) 5-325 MG per tablet 1 tablet (1 tablet Oral Given 01/15/23 1814)  iohexol (OMNIPAQUE) 350 MG/ML injection 75 mL (75 mLs Intravenous Contrast Given 01/15/23 1713)  lactated ringers bolus 1,000 mL (1,000 mLs Intravenous New Bag/Given 01/15/23 2013)    ED Course/ Medical Decision Making/ A&P Clinical Course as of 01/15/23 2032  Thu Jan 15, 2023  1945 Spoke with hospitalist Dr. Gasper Sells who will admit [AS]    Clinical Course User Index [AS] Lula Olszewski Edsel Petrin, PA-C                             Medical Decision Making Amount and/or Complexity of Data Reviewed Labs: ordered.  Risk Prescription drug management. Decision regarding hospitalization.  This patient presents to the ED for concern of nausea,  vomiting, this involves an extensive number of treatment options, and is a complaint that carries with it a high risk of complications and morbidity.  The emergent differential diagnosis for vomiting includes, but is not limited to ACS/MI, DKA, Ischemic bowel, Meningitis, Sepsis, Acute gastric dilation, Adrenal insufficiency, Appendicitis,  Bowel obstruction/ileus,  Carbon monoxide poisoning, Cholecystitis, Electrolyte abnormalities, Elevated ICP, Gastric outlet obstruction, Pancreatitis, Ruptured viscus, Biliary colic, Cannabinoid hyperemesis syndrome, Gastritis, Gastroenteritis, Gastroparesis,  Narcotic withdrawal, Peptic ulcer disease, and UTI   Co morbidities that complicate the patient evaluation   type 1 diabetes  My initial workup includes labs, imaging, symptom control, DKA treatment  Additional history obtained from: Nursing notes from this visit.  I ordered, reviewed and interpreted labs which include: CBC, CMP, troponin, VBG, beta hydroxybutyrate, urinalysis, osmolality, lipase.  Lipase elevated to 419.  Creatinine elevated to 1.53, initial hyperglycemia of 270 with an anion gap of 17.  Mild leukocytosis of 11.3.  Urinalysis with ketones.  Beta hydroxybutyrate greater than 8.  Troponin negative.  I ordered imaging studies including chest x-ray, CT abdomen pelvis. I independently visualized and interpreted imaging which showed reassuring chest x-ray and CT abdomen pelvis I agree with the radiologist interpretation  Cardiac Monitoring:  The patient was maintained on a cardiac monitor.  I personally viewed and interpreted the cardiac monitored which showed an underlying rhythm of: NSR  Consultations Obtained:  I requested consultation with the hospitalist Dr. Gasper Sells,  and discussed lab and imaging findings as well as pertinent plan - they recommend: Admission  Afebrile, initially tachycardic which improved after treatment.  Otherwise hemodynamically stable.  27 year old male  presenting to the ED for evaluation of nausea and vomiting.  States it feels like when he has been in DKA in the past, however this is worse than normal.  He is also complaining of some chest pain.  Believe this is likely secondary to his continued emesis.  EKG without ischemic changes.  Chest x-ray normal.  Troponin normal.  Low suspicion for cardiac causes.  His lab workup was consistent with DKA.  He is mildly acidotic at 7.30.  Beta hydroxybutyrate greater than 8.  Anion gap elevated.  He does have ketonuria.  His lipase is also elevated.  He denies alcohol use and his CT of his abdomen pelvis is reassuring as well.  This may be secondary to his hyperglycemic status.  Patient was bolused with 2 L of LR and started on an insulin drip per DKA protocol.  He was admitted to hospitalist for continued management.  Stable at the time of admission.  Note: Portions of this report may have been transcribed using voice recognition software. Every effort was made to ensure accuracy; however, inadvertent computerized transcription errors may still be present.        Final Clinical Impression(s) / ED Diagnoses Final diagnoses:  Diabetic ketoacidosis without coma associated with type 1 diabetes mellitus (HCC)  Acute pancreatitis without infection or necrosis, unspecified pancreatitis type    Rx / DC Orders ED Discharge Orders     None         Mora Bellman 01/15/23 2033    Alvira Monday, MD 01/16/23 1356

## 2023-01-15 NOTE — Plan of Care (Signed)

## 2023-01-15 NOTE — ED Triage Notes (Signed)
Pt arrived via [facility] from home for c/o N/Vx2d. Per EMS, pt told them his blood glucose has been running high. Per EMS, pt initial bp 108/78, hr 140. EMS gave NS 800 mL and zofran 4 mg IV.

## 2023-01-16 DIAGNOSIS — N179 Acute kidney failure, unspecified: Secondary | ICD-10-CM

## 2023-01-16 DIAGNOSIS — K859 Acute pancreatitis without necrosis or infection, unspecified: Secondary | ICD-10-CM

## 2023-01-16 DIAGNOSIS — E860 Dehydration: Secondary | ICD-10-CM

## 2023-01-16 DIAGNOSIS — E1010 Type 1 diabetes mellitus with ketoacidosis without coma: Secondary | ICD-10-CM

## 2023-01-16 LAB — MAGNESIUM
Magnesium: 1.8 mg/dL (ref 1.7–2.4)
Magnesium: 1.6 mg/dL — ABNORMAL LOW (ref 1.7–2.4)

## 2023-01-16 LAB — CBC
HCT: 35.7 % — ABNORMAL LOW (ref 39.0–52.0)
Hemoglobin: 11.9 g/dL — ABNORMAL LOW (ref 13.0–17.0)
MCH: 29 pg (ref 26.0–34.0)
MCHC: 33.3 g/dL (ref 30.0–36.0)
MCV: 86.9 fL (ref 80.0–100.0)
Platelets: 309 10*3/uL (ref 150–400)
RBC: 4.11 MIL/uL — ABNORMAL LOW (ref 4.22–5.81)
RDW: 12.8 % (ref 11.5–15.5)
WBC: 10.6 10*3/uL — ABNORMAL HIGH (ref 4.0–10.5)
nRBC: 0 % (ref 0.0–0.2)

## 2023-01-16 LAB — BASIC METABOLIC PANEL
Anion gap: 15 (ref 5–15)
BUN: 15 mg/dL (ref 6–20)
CO2: 24 mmol/L (ref 22–32)
Calcium: 9.2 mg/dL (ref 8.9–10.3)
Chloride: 104 mmol/L (ref 98–111)
Creatinine, Ser: 1.31 mg/dL — ABNORMAL HIGH (ref 0.61–1.24)
GFR, Estimated: >60 mL/min (ref >60)
Glucose, Bld: 177 mg/dL — ABNORMAL HIGH (ref 70–99)
Potassium: 3.7 mmol/L (ref 3.5–5.1)
Sodium: 143 mmol/L (ref 135–145)

## 2023-01-16 LAB — GLUCOSE, CAPILLARY
Glucose-Capillary: 133 mg/dL — ABNORMAL HIGH (ref 70–99)
Glucose-Capillary: 134 mg/dL — ABNORMAL HIGH (ref 70–99)
Glucose-Capillary: 145 mg/dL — ABNORMAL HIGH (ref 70–99)
Glucose-Capillary: 147 mg/dL — ABNORMAL HIGH (ref 70–99)
Glucose-Capillary: 152 mg/dL — ABNORMAL HIGH (ref 70–99)
Glucose-Capillary: 160 mg/dL — ABNORMAL HIGH (ref 70–99)
Glucose-Capillary: 174 mg/dL — ABNORMAL HIGH (ref 70–99)
Glucose-Capillary: 184 mg/dL — ABNORMAL HIGH (ref 70–99)
Glucose-Capillary: 344 mg/dL — ABNORMAL HIGH (ref 70–99)

## 2023-01-16 LAB — BETA-HYDROXYBUTYRIC ACID: Beta-Hydroxybutyric Acid: 2.91 mmol/L — ABNORMAL HIGH (ref 0.05–0.27)

## 2023-01-16 LAB — LIPASE, BLOOD: Lipase: 86 U/L — ABNORMAL HIGH (ref 11–51)

## 2023-01-16 MED ORDER — INSULIN GLARGINE-YFGN 100 UNIT/ML ~~LOC~~ SOLN
10.0000 [IU] | Freq: Two times a day (BID) | SUBCUTANEOUS | Status: DC
  Administered 2023-01-16: 10 [IU] via SUBCUTANEOUS
  Filled 2023-01-16 (×2): qty 0.1

## 2023-01-16 MED ORDER — INSULIN ASPART 100 UNIT/ML IJ SOLN: 0.0000 [IU] | Freq: Three times a day (TID) | INTRAMUSCULAR | Status: DC

## 2023-01-16 MED ORDER — LACTATED RINGERS IV SOLN: INTRAVENOUS | Status: DC

## 2023-01-16 MED ORDER — INSULIN ASPART 100 UNIT/ML IJ SOLN
0.0000 [IU] | Freq: Every day | INTRAMUSCULAR | Status: DC
  Administered 2023-01-16: 4 [IU] via SUBCUTANEOUS

## 2023-01-16 MED ORDER — INSULIN ASPART 100 UNIT/ML IJ SOLN
0.0000 [IU] | Freq: Three times a day (TID) | INTRAMUSCULAR | Status: DC
  Administered 2023-01-16: 2 [IU] via SUBCUTANEOUS
  Administered 2023-01-16: 1 [IU] via SUBCUTANEOUS
  Administered 2023-01-16: 2 [IU] via SUBCUTANEOUS

## 2023-01-16 MED ORDER — INSULIN ASPART 100 UNIT/ML IJ SOLN
4.0000 [IU] | Freq: Three times a day (TID) | INTRAMUSCULAR | Status: DC
  Administered 2023-01-16 (×2): 4 [IU] via SUBCUTANEOUS

## 2023-01-16 MED ORDER — INSULIN GLARGINE-YFGN 100 UNIT/ML ~~LOC~~ SOLN
14.0000 [IU] | Freq: Two times a day (BID) | SUBCUTANEOUS | Status: DC
  Administered 2023-01-16: 14 [IU] via SUBCUTANEOUS
  Filled 2023-01-16 (×2): qty 0.14

## 2023-01-16 NOTE — Progress Notes (Signed)
[facility] night cross cover note:   Regarding this patient who was admitted for DKA and started on insulin drip at the time of admission, I was notified by the patient's RN that the patient's anion gap has closed, most recently was noted to be 15.  Most recent CBG noted to be in the 150's, and the patient is tolerating p.o. at this time.  Consequently will initiate the transition from IV insulin to subcutaneous insulin order set at this time.  Patient's home basal insulin dose is noted to be Lantus 18 units subcu bid.  Will initiate slightly more than half of this dose at this present time.  Correspondingly, I have ordered Lantus 10 units SQ bid, first dose now.  Following initiation of this first dose of subcutaneous insulin, will continue insulin drip for another 2 hours during which time will continue existing D5 LR at 125 cc/h in order to prevent hypoglycemia given concomitant continuation of insulin drip over this timeframe.  I have modified the D5 LR order to reflect a stop Time of 630 AM, corresponding with anticipated time of discontinuation of insulin drip.    I have also ordered CBG to be checked at the time of discontinuation of the insulin drip, with associated orders for corrective sliding scale insulin based upon this CBG value.  Subsequent to that, I have initiated ensuing CBG monitoring in the form of QAC/HS CBG checks with associated sliding scale insulin coverage.      Newton Pigg, DO Hospitalist

## 2023-01-16 NOTE — H&P (Addendum)
History and Physical    Patient: Joseph Barr:[id number] DOB: May 17, 1996 DOA: 01/15/2023 DOS: the patient was seen and examined on 01/16/2023 PCP: [name] Rigg, NP  Patient coming from: Home  Chief Complaint:  Chief Complaint  Patient presents with   Emesis   HPI: DEMITRIUS Barr is a 27 y.o. male with medical history significant of type 1 diabetes mellitus who presents with nausea, vomiting and blood sugar of greater than 400 at home.  Patient has had chest pain and nausea this week.  His blood sugars however were fine until last night.  He denies any fevers or cough or signs of infection.  He was taking his insulin twice a day as prescribed.  He is not sure what brought on the DKA this time.  His chest hurts worse after vomiting. His evaluation in the emergency department revealed that he had an increased ion gap metabolic acidosis consistent with DKA.  He was started on an insulin drip in the emergency department and will be admitted to progressive care bed.  His last hospitalization for DKA was 6 months ago.    Review of Systems: As mentioned in the history of present illness. All other systems reviewed and are negative. Past Medical History:  Diagnosis Date   Diabetes mellitus    History reviewed. No pertinent surgical history. Social History:  reports that he has quit smoking. His smoking use included e-cigarettes. He has quit using smokeless tobacco. He reports current drug use. Drug: Marijuana. He reports that he does not drink alcohol. He works in [employer].  No Known Allergies  Family History  Problem Relation Age of Onset   Cancer Maternal Grandfather    Hypertension Mother    Diabetes Neg Hx     Prior to Admission medications   Medication Sig Start Date End Date Taking? Authorizing Provider  acetaminophen (TYLENOL) 500 MG tablet Take 500 mg by mouth every 6 (six) hours as needed for headache.   Yes [provider]  insulin glargine (LANTUS) 100  UNIT/ML Solostar Pen Inject 18 Units into the skin 2 (two) times daily. 03/06/22  Yes Lonia Blood, MD  insulin lispro (HUMALOG) 100 UNIT/ML injection Inject 0.05 mLs (5 Units total) into the skin 3 (three) times daily with meals. 03/06/22  Yes Lonia Blood, MD  Blood Glucose Monitoring Suppl (TRUE METRIX METER) w/Device KIT Use as directed 03/06/22   Lonia Blood, MD  glucose blood (TRUE METRIX BLOOD GLUCOSE TEST) test strip Use as instructed 01/08/21   Rema Fendt, NP  glucose blood test strip use as directed to test blood sugars four times a day 03/06/22   Lonia Blood, MD  Insulin Pen Needle 31G X 5 MM MISC Use to inject Lantus twice daily. 03/06/22   Lonia Blood, MD  Insulin Syringe-Needle U-100 31G X 15/64" 0.3 ML MISC Use to inject Humalog 3x daily. Keep upcoming appt for additional refills. 03/06/22   Lonia Blood, MD  TRUEplus Lancets 28G MISC use as directed to test blood sugars four times a day 03/06/22   Lonia Blood, MD    Physical Exam: Vitals:   01/15/23 2145 01/15/23 2200 01/15/23 2303 01/16/23 0253  BP: 111/79 132/85 106/66 123/80  Pulse: (!) 127 (!) 125 (!) 123 (!) 114  Resp: 15  15 16   Temp: 98.7 F (37.1 C)  99.1 F (37.3 C) 98.7 F (37.1 C)  TempSrc: Oral  Oral Oral  SpO2: 100% 99% 97%  96%  Weight: 60.1 kg     Height: 6' (1.829 m)      Physical Exam:  General: No acute distress, well developed, thin HEENT: Normocephalic, atraumatic, PERRL Cardiovascular: Normal rhythm, tachy. Distal pulses intact. Pulmonary: Normal pulmonary effort, normal breath sounds Gastrointestinal: Nondistended abdomen, soft, non-tender, normoactive bowel sounds, no organomegaly Musculoskeletal:Normal ROM, no lower ext edema Lymphadenopathy: No cervical LAD. Skin: Skin is warm and dry. Neuro: No focal deficits noted, AAOx3. PSYCH: Attentive and cooperative   Data Reviewed:  Results for orders placed or performed during the hospital encounter of  01/15/23 (from the past 24 hour(s))  CBG monitoring, ED     Status: Abnormal   Collection Time: 01/15/23  1:34 PM  Result Value Ref Range   Glucose-Capillary 239 (H) 70 - 99 mg/dL  Lipase, blood     Status: Abnormal   Collection Time: 01/15/23  1:36 PM  Result Value Ref Range   Lipase 419 (H) 11 - 51 U/L  Comprehensive metabolic panel     Status: Abnormal   Collection Time: 01/15/23  1:36 PM  Result Value Ref Range   Sodium 139 135 - 145 mmol/L   Potassium 4.8 3.5 - 5.1 mmol/L   Chloride 100 98 - 111 mmol/L   CO2 22 22 - 32 mmol/L   Glucose, Bld 267 (H) 70 - 99 mg/dL   BUN 19 6 - 20 mg/dL   Creatinine, Ser 1.61 (H) 0.61 - 1.24 mg/dL   Calcium 9.3 8.9 - 09.6 mg/dL   Total Protein 7.2 6.5 - 8.1 g/dL   Albumin 3.9 3.5 - 5.0 g/dL   AST 28 15 - 41 U/L   ALT 32 0 - 44 U/L   Alkaline Phosphatase 88 38 - 126 U/L   Total Bilirubin 1.1 0.3 - 1.2 mg/dL   GFR, Estimated >04 >54 mL/min   Anion gap 17 (H) 5 - 15  CBC     Status: Abnormal   Collection Time: 01/15/23  1:36 PM  Result Value Ref Range   WBC 11.3 (H) 4.0 - 10.5 K/uL   RBC 4.74 4.22 - 5.81 MIL/uL   Hemoglobin 14.0 13.0 - 17.0 g/dL   HCT 09.8 11.9 - 14.7 %   MCV 88.2 80.0 - 100.0 fL   MCH 29.5 26.0 - 34.0 pg   MCHC 33.5 30.0 - 36.0 g/dL   RDW 82.9 56.2 - 13.0 %   Platelets 396 150 - 400 K/uL   nRBC 0.0 0.0 - 0.2 %  Urinalysis, Routine w reflex microscopic -Urine, Clean Catch     Status: Abnormal   Collection Time: 01/15/23  1:45 PM  Result Value Ref Range   Color, Urine YELLOW YELLOW   APPearance CLEAR CLEAR   Specific Gravity, Urine 1.025 1.005 - 1.030   pH 5.0 5.0 - 8.0   Glucose, UA >=500 (A) NEGATIVE mg/dL   Hgb urine dipstick NEGATIVE NEGATIVE   Bilirubin Urine NEGATIVE NEGATIVE   Ketones, ur 80 (A) NEGATIVE mg/dL   Protein, ur 865 (A) NEGATIVE mg/dL   Nitrite NEGATIVE NEGATIVE   Leukocytes,Ua NEGATIVE NEGATIVE   RBC / HPF 0-5 0 - 5 RBC/hpf   WBC, UA 0-5 0 - 5 WBC/hpf   Bacteria, UA RARE (A) NONE SEEN    Squamous Epithelial / HPF 0-5 0 - 5 /HPF   Mucus PRESENT    Hyaline Casts, UA PRESENT   I-Stat venous blood gas, ([facility], [facility], [facility])     Status: Abnormal   Collection Time:  01/15/23  4:23 PM  Result Value Ref Range   pH, Ven 7.302 7.25 - 7.43   pCO2, Ven 46.7 44 - 60 mmHg   pO2, Ven 26 (LL) 32 - 45 mmHg   Bicarbonate 23.1 20.0 - 28.0 mmol/L   TCO2 24 22 - 32 mmol/L   O2 Saturation 41 %   Acid-base deficit 4.0 (H) 0.0 - 2.0 mmol/L   Sodium 136 135 - 145 mmol/L   Potassium 5.3 (H) 3.5 - 5.1 mmol/L   Calcium, Ion 1.22 1.15 - 1.40 mmol/L   HCT 44.0 39.0 - 52.0 %   Hemoglobin 15.0 13.0 - 17.0 g/dL   Sample type VENOUS    Comment NOTIFIED PHYSICIAN   Beta-hydroxybutyric acid     Status: Abnormal   Collection Time: 01/15/23  4:29 PM  Result Value Ref Range   Beta-Hydroxybutyric Acid >8.00 (H) 0.05 - 0.27 mmol/L  Osmolality     Status: Abnormal   Collection Time: 01/15/23  6:32 PM  Result Value Ref Range   Osmolality 347 (HH) 275 - 295 mOsm/kg  Troponin I (High Sensitivity)     Status: None   Collection Time: 01/15/23  6:32 PM  Result Value Ref Range   Troponin I (High Sensitivity) 9 <18 ng/L  Ethanol     Status: None   Collection Time: 01/15/23  6:32 PM  Result Value Ref Range   Alcohol, Ethyl (B) <10 <10 mg/dL  CBG monitoring, ED     Status: Abnormal   Collection Time: 01/15/23  6:51 PM  Result Value Ref Range   Glucose-Capillary 508 (HH) 70 - 99 mg/dL   Comment 1 Notify RN    Comment 2 Document in Chart   CBG monitoring, ED     Status: Abnormal   Collection Time: 01/15/23  7:26 PM  Result Value Ref Range   Glucose-Capillary 474 (H) 70 - 99 mg/dL  CBG monitoring, ED     Status: Abnormal   Collection Time: 01/15/23  8:03 PM  Result Value Ref Range   Glucose-Capillary 384 (H) 70 - 99 mg/dL  Rapid urine drug screen (hospital performed)     Status: Abnormal   Collection Time: 01/15/23  8:18 PM  Result Value Ref Range   Opiates NONE DETECTED NONE DETECTED   Cocaine NONE  DETECTED NONE DETECTED   Benzodiazepines NONE DETECTED NONE DETECTED   Amphetamines NONE DETECTED NONE DETECTED   Tetrahydrocannabinol POSITIVE (A) NONE DETECTED   Barbiturates NONE DETECTED NONE DETECTED  CBC     Status: Abnormal   Collection Time: 01/15/23  8:27 PM  Result Value Ref Range   WBC 11.7 (H) 4.0 - 10.5 K/uL   RBC 4.56 4.22 - 5.81 MIL/uL   Hemoglobin 13.3 13.0 - 17.0 g/dL   HCT 16.1 09.6 - 04.5 %   MCV 89.3 80.0 - 100.0 fL   MCH 29.2 26.0 - 34.0 pg   MCHC 32.7 30.0 - 36.0 g/dL   RDW 40.9 81.1 - 91.4 %   Platelets 361 150 - 400 K/uL   nRBC 0.0 0.0 - 0.2 %  Basic metabolic panel     Status: Abnormal   Collection Time: 01/15/23  8:27 PM  Result Value Ref Range   Sodium 142 135 - 145 mmol/L   Potassium 4.1 3.5 - 5.1 mmol/L   Chloride 102 98 - 111 mmol/L   CO2 20 (L) 22 - 32 mmol/L   Glucose, Bld 390 (H) 70 - 99 mg/dL   BUN 20 6 -  20 mg/dL   Creatinine, Ser 2.95 (H) 0.61 - 1.24 mg/dL   Calcium 9.6 8.9 - 62.1 mg/dL   GFR, Estimated 51 (L) >60 mL/min   Anion gap 20 (H) 5 - 15  Troponin I (High Sensitivity)     Status: None   Collection Time: 01/15/23  8:27 PM  Result Value Ref Range   Troponin I (High Sensitivity) 5 <18 ng/L  Magnesium     Status: None   Collection Time: 01/15/23  8:27 PM  Result Value Ref Range   Magnesium 2.0 1.7 - 2.4 mg/dL  CBG monitoring, ED     Status: Abnormal   Collection Time: 01/15/23  9:13 PM  Result Value Ref Range   Glucose-Capillary 252 (H) 70 - 99 mg/dL   Comment 1 Document in Chart   Glucose, capillary     Status: Abnormal   Collection Time: 01/15/23  9:51 PM  Result Value Ref Range   Glucose-Capillary 221 (H) 70 - 99 mg/dL   Comment 1 Notify RN    Comment 2 Document in Chart   Glucose, capillary     Status: Abnormal   Collection Time: 01/15/23 10:52 PM  Result Value Ref Range   Glucose-Capillary 220 (H) 70 - 99 mg/dL  Glucose, capillary     Status: Abnormal   Collection Time: 01/15/23 11:53 PM  Result Value Ref Range    Glucose-Capillary 144 (H) 70 - 99 mg/dL   Comment 1 Notify RN    Comment 2 Document in Chart   Glucose, capillary     Status: Abnormal   Collection Time: 01/16/23 12:50 AM  Result Value Ref Range   Glucose-Capillary 184 (H) 70 - 99 mg/dL   Comment 1 Notify RN    Comment 2 Document in Chart   Basic metabolic panel     Status: Abnormal   Collection Time: 01/16/23  1:20 AM  Result Value Ref Range   Sodium 143 135 - 145 mmol/L   Potassium 3.7 3.5 - 5.1 mmol/L   Chloride 104 98 - 111 mmol/L   CO2 24 22 - 32 mmol/L   Glucose, Bld 177 (H) 70 - 99 mg/dL   BUN 15 6 - 20 mg/dL   Creatinine, Ser 3.08 (H) 0.61 - 1.24 mg/dL   Calcium 9.2 8.9 - 65.7 mg/dL   GFR, Estimated >84 >69 mL/min   Anion gap 15 5 - 15  Glucose, capillary     Status: Abnormal   Collection Time: 01/16/23  1:51 AM  Result Value Ref Range   Glucose-Capillary 145 (H) 70 - 99 mg/dL   Comment 1 Notify RN    Comment 2 Document in Chart   Glucose, capillary     Status: Abnormal   Collection Time: 01/16/23  2:53 AM  Result Value Ref Range   Glucose-Capillary 152 (H) 70 - 99 mg/dL   Comment 1 Notify RN    Comment 2 Document in Chart   Glucose, capillary     Status: Abnormal   Collection Time: 01/16/23  4:03 AM  Result Value Ref Range   Glucose-Capillary 147 (H) 70 - 99 mg/dL   Comment 1 Notify RN    Comment 2 Document in Chart      Assessment and Plan: DKA, initial anion gap = 20 - Insulin drip, electrolyte management and fluids per protocol - His home insulin dose is Lantus 18 units bid plus mealtime humalog sliding scale - The precipitating cause of this episode is not clear at the moment.  He does not have evidence of infection.  2. AKI -aggressive IV fluid replacement.   Advance Care Planning:   Code Status: Full Code he names his mother as his surrogate decision maker and wants to be full code.  Consults: none  Family Communication: none  Severity of Illness: The appropriate patient status for this  patient is INPATIENT. Inpatient status is judged to be reasonable and necessary in order to provide the required intensity of service to ensure the patient's safety. The patient's presenting symptoms, physical exam findings, and initial radiographic and laboratory data in the context of their chronic comorbidities is felt to place them at high risk for further clinical deterioration. Furthermore, it is not anticipated that the patient will be medically stable for discharge from the hospital within 2 midnights of admission.   * I certify that at the point of admission it is my clinical judgment that the patient will require inpatient hospital care spanning beyond 2 midnights from the point of admission due to high intensity of service, high risk for further deterioration and high frequency of surveillance required.*  Author: Buena Irish, MD 01/16/2023 4:35 AM  For on call review [email or web address].

## 2023-01-16 NOTE — Progress Notes (Signed)
  Transition of Care [facility]) Screening Note   Patient Details  Name: Joseph Barr Date of Birth: 1996-01-04   Transition of Care [facility]) CM/SW Contact:    Harriet Masson, RN Phone Number: 01/16/2023, 8:17 AM    Transition of Care Department [facility]) has reviewed patient and no TOC needs have been identified at this time. We will continue to monitor patient advancement through interdisciplinary progression rounds. If new patient transition needs arise, please place a TOC consult.

## 2023-01-16 NOTE — Progress Notes (Addendum)
[facility]                                                                              Joseph Barr, is a 27 y.o. male, DOB - 1995-09-02, WUJ:[id number] Admit date - 01/15/2023    Outpatient Primary MD for the patient is Claiborne Rigg, NP  LOS - 1  days  Chief Complaint  Patient presents with   Emesis       Brief summary   Patient is a 27 year old male with type 1 diabetes mellitus, presented with nausea vomiting and blood sugars of greater than 400 at home.  Patient also reported having symptoms of anxiety, chest pain, nausea and vomiting this week.  No fevers cough or any signs of infection.  He was taking his insulin as prescribed.  He is not sure what brought on the DKA at this time.  He reported that his chest hurt after the vomiting episode. In ED, patient was found to be in DKA, started on insulin drip, IV fluids and admitted for further workup.   Assessment & Plan    Principal Problem:   Diabetic ketoacidosis without coma associated with type 1 diabetes mellitus (HCC) -Presented with DKA, elevated anion gap 17, glucose 267, BHB > 8  -He was placed on IV insulin drip, aggressive IV fluid hydration. -BHB improved to 2.9 this morning -Transitioned to subcu insulin -Increased Semglee to 14 units twice daily, add NovoLog 4 units 3 times daily AC, sliding scale insulin -Follow hemoglobin A1c, diabetic coordinator consulted  Active problems Elevated lipase ?  Pancreatitis -Recheck lipase, currently no abdominal pain, CT abdomen did not show acute pancreatitis -Lipase on admission 419    AKI (acute kidney injury) (HCC), dehydration -Likely due to #1, nausea vomiting -Baseline creatinine 1.1 on 12/18/2022 -Presented with creatinine of 1.53, trended up to 1.8, placed on aggressive IV fluid hydration -Improving 1.3 today, continue IV fluids  Underweight Estimated body mass index is 17.97 kg/m as calculated from the following:   Height as of this  encounter: 6' (1.829 m).   Weight as of this encounter: 60.1 kg.  Code Status: Full code DVT Prophylaxis:  enoxaparin (LOVENOX) injection 40 mg Start: 01/15/23 2200   Level of Care: Level of care: Progressive Family Communication: Updated patient Disposition Plan:      Remains inpatient appropriate: Prefers to go home tomorrow morning if no acute issues   Procedures:  None  Consultants:   None  Antimicrobials: None    Medications  enoxaparin (LOVENOX) injection  40 mg Subcutaneous Q24H   insulin aspart  0-5 Units Subcutaneous QHS   insulin aspart  0-9 Units Subcutaneous TID WC   insulin aspart  4 Units Subcutaneous TID WC   insulin glargine-yfgn  14 Units Subcutaneous BID      Subjective:   Joseph Barr was seen and examined today.  Feeling somewhat better, was transitioned to subcu insulin today.  Currently no chest pain.  Patient denies dizziness, shortness of breath, abdominal pain, N/V/D/C.  Objective:   Vitals:   01/15/23 2303 01/16/23 0253 01/16/23 0500 01/16/23 0809  BP: 106/66 123/80 123/80 133/83  Pulse: Marland Kitchen)  123 (!) 114 (!) 109 (!) 110  Resp: 15 16  14   Temp: 99.1 F (37.3 C) 98.7 F (37.1 C)  98.4 F (36.9 C)  TempSrc: Oral Oral  Oral  SpO2: 97% 96% 96% 98%  Weight:      Height:        Intake/Output Summary (Last 24 hours) at 01/16/2023 1046 Last data filed at 01/16/2023 0900 Gross per 24 hour  Intake 3121.01 ml  Output 1050 ml  Net 2071.01 ml     Wt Readings from Last 3 Encounters:  01/15/23 60.1 kg  07/05/22 68 kg  03/04/22 52.4 kg     Exam General: Alert and oriented x 3, NAD Cardiovascular: S1 S2 auscultated,  RRR Respiratory: Clear to auscultation bilaterally Gastrointestinal: Soft, nontender, nondistended, + bowel sounds Ext: no pedal edema bilaterally Neuro: no new deficits Psych: Normal affect     Data Reviewed:  I have personally reviewed following labs    CBC Lab Results  Component Value Date   WBC 10.6 (H)  01/16/2023   RBC 4.11 (L) 01/16/2023   HGB 11.9 (L) 01/16/2023   HCT 35.7 (L) 01/16/2023   MCV 86.9 01/16/2023   MCH 29.0 01/16/2023   PLT 309 01/16/2023   MCHC 33.3 01/16/2023   RDW 12.8 01/16/2023   LYMPHSABS 1.8 12/18/2022   MONOABS 0.4 12/18/2022   EOSABS 0.1 12/18/2022   BASOSABS 0.0 12/18/2022     Last metabolic panel Lab Results  Component Value Date   NA 143 01/16/2023   K 3.7 01/16/2023   CL 104 01/16/2023   CO2 24 01/16/2023   BUN 15 01/16/2023   CREATININE 1.31 (H) 01/16/2023   GLUCOSE 177 (H) 01/16/2023   GFRNONAA >60 01/16/2023   GFRAA 141 12/01/2019   CALCIUM 9.2 01/16/2023   PHOS 2.6 03/05/2022   PROT 7.2 01/15/2023   ALBUMIN 3.9 01/15/2023   LABGLOB 2.6 12/01/2019   AGRATIO 1.8 12/01/2019   BILITOT 1.1 01/15/2023   ALKPHOS 88 01/15/2023   AST 28 01/15/2023   ALT 32 01/15/2023   ANIONGAP 15 01/16/2023    CBG (last 3)  Recent Labs    01/16/23 0403 01/16/23 0604 01/16/23 0635  GLUCAP 147* 133* 134*      Coagulation Profile: No results for input(s): "INR", "PROTIME" in the last 168 hours.   Radiology Studies: I have personally reviewed the imaging studies  CT ABDOMEN PELVIS W CONTRAST  Result Date: 01/15/2023 CLINICAL DATA:  Nausea vomiting EXAM: CT ABDOMEN AND PELVIS WITH CONTRAST TECHNIQUE: Multidetector CT imaging of the abdomen and pelvis was performed using the standard protocol following bolus administration of intravenous contrast. RADIATION DOSE REDUCTION: This exam was performed according to the departmental dose-optimization program which includes automated exposure control, adjustment of the mA and/or kV according to patient size and/or use of iterative reconstruction technique. CONTRAST:  75mL OMNIPAQUE IOHEXOL 350 MG/ML SOLN COMPARISON:  CT 12/18/2022 FINDINGS: Lower chest: No acute abnormality. Hepatobiliary: No focal liver abnormality is seen. No gallstones, gallbladder wall thickening, or biliary dilatation. Pancreas: Unremarkable.  No pancreatic ductal dilatation or surrounding inflammatory changes. Spleen: Normal in size without focal abnormality. Adrenals/Urinary Tract: Adrenal glands are unremarkable. Kidneys are normal, without renal calculi, focal lesion, or hydronephrosis. Bladder is unremarkable. Stomach/Bowel: Stomach is within normal limits. No evidence of bowel wall thickening, distention, or inflammatory changes. Vascular/Lymphatic: No significant vascular findings are present. No enlarged abdominal or pelvic lymph nodes. Reproductive: Prostate is unremarkable. Other: No abdominal wall hernia or abnormality. No abdominopelvic ascites.  Musculoskeletal: No acute or significant osseous findings. IMPRESSION: No CT evidence for acute abnormality in the abdomen or pelvis. Electronically Signed   By: Jasmine Pang M.D.   On: 01/15/2023 17:46   DG Chest 2 View  Result Date: 01/15/2023 CLINICAL DATA:  Chest pain EXAM: CHEST - 2 VIEW COMPARISON:  Chest radiograph dated July 06, 2022 FINDINGS: The heart size and mediastinal contours are within normal limits. Both lungs are clear. The visualized skeletal structures are unremarkable. IMPRESSION: No active cardiopulmonary disease. Electronically Signed   By: Larose Hires D.O.   On: 01/15/2023 14:57       Negin Hegg M.D. [facility] 01/16/2023, 10:46 AM  Available via Epic secure chat 7am-7pm After 7 pm, please refer to night coverage provider listed on amion.

## 2023-01-16 NOTE — Inpatient Diabetes Management (Addendum)
Inpatient Diabetes Program Recommendations  AACE/ADA: New Consensus Statement on Inpatient Glycemic Control (2015)  Target Ranges:  Prepandial:   less than 140 mg/dL      Peak postprandial:   less than 180 mg/dL (1-2 hours)      Critically ill patients:  140 - 180 mg/dL   Lab Results  Component Value Date   GLUCAP 134 (H) 01/16/2023   HGBA1C >15.5 (H) 03/05/2022    Latest Reference Range & Units 01/16/23 01:20  Sodium 135 - 145 mmol/L 143  Potassium 3.5 - 5.1 mmol/L 3.7  Chloride 98 - 111 mmol/L 104  CO2 22 - 32 mmol/L 24  Glucose 70 - 99 mg/dL 409 (H)  BUN 6 - 20 mg/dL 15  Creatinine 8.11 - 9.14 mg/dL 7.82 (H)  Calcium 8.9 - 10.3 mg/dL 9.2  Anion gap 5 - 15  15  (H): Data is abnormally high  Diabetes history: DM1 Outpatient Diabetes medications: Lantus 18 BID, Humalog 5 TID Current orders for Inpatient glycemic control: Semglee 14 units bid, Novolog 0-9 units tid  Pending A1c  Inpatient Diabetes Program Recommendations:   Please consider: -Novolog 4 units tid meal coverage if eats >50% meals -Add Novolog 0-5 units hs  DM coordinators have reviewed diabetes management on prior admissions in the past. One inpatient admission in the past 6 months. Will follow while hospitalized.  Spoke with patient @ bedside. Patient states he continued to take his insulin as prescribed and also took Humalog correction for elevated CBGs, but CBGs continued to elevate.  Thank you, Billy Fischer. Jonh Mcqueary, RN, MSN, CDE  Diabetes Coordinator Inpatient Glycemic Control Team Team Pager [phone] (8am-5pm) 01/16/2023 8:37 AM

## 2023-01-16 NOTE — Progress Notes (Signed)
Mobility Specialist: Progress Note   01/16/23 1211  Mobility  Activity Ambulated independently in hallway  Level of Assistance Modified independent, requires aide device or extra time  Assistive Device Other (Comment) (IV pole)  Distance Ambulated (ft) 400 ft  Activity Response Tolerated well  Mobility Referral Yes  $Mobility charge 1 Mobility  Mobility Specialist Start Time (ACUTE ONLY) 1202  Mobility Specialist Stop Time (ACUTE ONLY) 1213  Mobility Specialist Time Calculation (min) (ACUTE ONLY) 11 min   Pre-Mobility: 107 HR, 139/92 (104) BP, 99% SpO2 During Mobility: 122 HR Post-Mobility: 104 HR, 131/93 (104) BP, 99% SpO2  Received pt in bed having no complaints and agreeable to mobility. Pt was asymptomatic throughout ambulation and returned to room w/o fault. Left in bed w/ call bell in reach and all needs met.  Eddrick Dilone Mobility Specialist Please contact via SecureChat or Rehab office at [phone]

## 2023-01-17 ENCOUNTER — Other Ambulatory Visit (HOSPITAL_COMMUNITY): Payer: Self-pay

## 2023-01-17 DIAGNOSIS — E860 Dehydration: Secondary | ICD-10-CM | POA: Diagnosis not present

## 2023-01-17 DIAGNOSIS — K859 Acute pancreatitis without necrosis or infection, unspecified: Secondary | ICD-10-CM | POA: Diagnosis not present

## 2023-01-17 DIAGNOSIS — Z720 Tobacco use: Secondary | ICD-10-CM

## 2023-01-17 DIAGNOSIS — N179 Acute kidney failure, unspecified: Secondary | ICD-10-CM | POA: Diagnosis not present

## 2023-01-17 DIAGNOSIS — E1010 Type 1 diabetes mellitus with ketoacidosis without coma: Secondary | ICD-10-CM | POA: Diagnosis not present

## 2023-01-17 LAB — BASIC METABOLIC PANEL
Anion gap: 9 (ref 5–15)
BUN: 5 mg/dL — ABNORMAL LOW (ref 6–20)
CO2: 26 mmol/L (ref 22–32)
Calcium: 8.3 mg/dL — ABNORMAL LOW (ref 8.9–10.3)
Chloride: 101 mmol/L (ref 98–111)
Creatinine, Ser: 0.87 mg/dL (ref 0.61–1.24)
GFR, Estimated: >60 mL/min (ref >60)
Glucose, Bld: 172 mg/dL — ABNORMAL HIGH (ref 70–99)
Potassium: 3.2 mmol/L — ABNORMAL LOW (ref 3.5–5.1)
Sodium: 136 mmol/L (ref 135–145)

## 2023-01-17 LAB — GLUCOSE, CAPILLARY: Glucose-Capillary: 88 mg/dL (ref 70–99)

## 2023-01-17 LAB — MAGNESIUM: Magnesium: 1.5 mg/dL — ABNORMAL LOW (ref 1.7–2.4)

## 2023-01-17 MED ORDER — MAGNESIUM OXIDE -MG SUPPLEMENT 400 (240 MG) MG PO TABS
400.0000 mg | ORAL_TABLET | Freq: Two times a day (BID) | ORAL | 0 refills | Status: DC
  Filled 2023-01-17: qty 30, 15d supply, fill #0

## 2023-01-17 MED ORDER — INSULIN GLARGINE-YFGN 100 UNIT/ML ~~LOC~~ SOLN
18.0000 [IU] | Freq: Two times a day (BID) | SUBCUTANEOUS | Status: DC
  Administered 2023-01-17: 18 [IU] via SUBCUTANEOUS
  Filled 2023-01-17 (×2): qty 0.18

## 2023-01-17 MED ORDER — INSULIN ASPART 100 UNIT/ML IJ SOLN
5.0000 [IU] | Freq: Three times a day (TID) | INTRAMUSCULAR | Status: DC
  Administered 2023-01-17: 5 [IU] via SUBCUTANEOUS

## 2023-01-17 MED ORDER — ONDANSETRON HCL 4 MG PO TABS
4.0000 mg | ORAL_TABLET | Freq: Four times a day (QID) | ORAL | 0 refills | Status: DC | PRN
  Filled 2023-01-17: qty 30, 8d supply, fill #0

## 2023-01-17 MED ORDER — POTASSIUM CHLORIDE CRYS ER 20 MEQ PO TBCR
40.0000 meq | EXTENDED_RELEASE_TABLET | Freq: Once | ORAL | Status: AC
  Administered 2023-01-17: 40 meq via ORAL
  Filled 2023-01-17: qty 2

## 2023-01-17 MED ORDER — INSULIN GLARGINE-YFGN 100 UNIT/ML ~~LOC~~ SOLN
18.0000 [IU] | Freq: Two times a day (BID) | SUBCUTANEOUS | Status: DC
  Filled 2023-01-17 (×2): qty 0.18

## 2023-01-17 MED ORDER — MAGNESIUM OXIDE -MG SUPPLEMENT 400 (240 MG) MG PO TABS: 400.0000 mg | ORAL_TABLET | Freq: Two times a day (BID) | ORAL | Status: DC
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
## 2023-01-19 ENCOUNTER — Telehealth: Payer: Self-pay

## 2023-01-19 ENCOUNTER — Other Ambulatory Visit (HOSPITAL_COMMUNITY): Payer: Self-pay

## 2023-01-19 NOTE — Transitions of Care (Post Inpatient/ED Visit) (Signed)
   01/19/2023  Name: IGNAC ECKSTEIN MRN: [id number] DOB: September 28, 1995  Today's TOC FU Call Status: Today's TOC FU Call Status:: Unsuccessul Call (1st Attempt) Unsuccessful Call (1st Attempt) Date: 01/19/23  Attempted to reach the patient regarding the most recent Inpatient/ED visit.  Follow Up Plan: Additional outreach attempts will be made to reach the patient to complete the Transitions of Care (Post Inpatient/ED visit) call.   Signature  Robyne Peers, RN

## 2023-01-20 ENCOUNTER — Telehealth: Payer: Self-pay

## 2023-01-20 LAB — HEMOGLOBIN A1C
Hgb A1c MFr Bld: >15.5 % — ABNORMAL HIGH (ref 4.8–5.6)
Mean Plasma Glucose: >398 mg/dL

## 2023-01-20 NOTE — Transitions of Care (Post Inpatient/ED Visit) (Signed)
   01/20/2023  Name: Joseph Barr MRN: [id number] DOB: 10-07-1995  Today's TOC FU Call Status: Today's TOC FU Call Status:: Unsuccessful Call (2nd Attempt) Unsuccessful Call (1st Attempt) Date: 01/19/23 Unsuccessful Call (2nd Attempt) Date: 01/20/23  Attempted to reach the patient regarding the most recent Inpatient/ED visit.  Follow Up Plan: Additional outreach attempts will be made to reach the patient to complete the Transitions of Care (Post Inpatient/ED visit) call.   Signature  Robyne Peers, RN

## 2023-01-21 ENCOUNTER — Telehealth: Payer: Self-pay

## 2023-01-21 NOTE — Transitions of Care (Post Inpatient/ED Visit) (Signed)
   01/21/2023  Name: Joseph Barr MRN: [id number] DOB: September 18, 1995  Today's TOC FU Call Status: Today's TOC FU Call Status:: Unsuccessful Call (3rd Attempt) Unsuccessful Call (1st Attempt) Date: 01/19/23 Unsuccessful Call (2nd Attempt) Date: 01/20/23 Unsuccessful Call (3rd Attempt) Date: 01/21/23  Attempted to reach the patient regarding the most recent Inpatient/ED visit.  Follow Up Plan: No further outreach attempts will be made at this time. We have been unable to contact the patient.  Letter sent to patient requesting he contact [facility] to schedule a follow up appointment as we have not been able to reach him   Signature Robyne Peers, RN

## 2023-01-27 ENCOUNTER — Other Ambulatory Visit (HOSPITAL_COMMUNITY): Payer: Self-pay

## 2023-03-07 ENCOUNTER — Emergency Department (HOSPITAL_BASED_OUTPATIENT_CLINIC_OR_DEPARTMENT_OTHER): Payer: Medicaid Other

## 2023-03-07 ENCOUNTER — Emergency Department (HOSPITAL_BASED_OUTPATIENT_CLINIC_OR_DEPARTMENT_OTHER): Payer: Medicaid Other | Admitting: Radiology

## 2023-03-07 ENCOUNTER — Encounter (HOSPITAL_BASED_OUTPATIENT_CLINIC_OR_DEPARTMENT_OTHER): Payer: Self-pay

## 2023-03-07 ENCOUNTER — Inpatient Hospital Stay (HOSPITAL_BASED_OUTPATIENT_CLINIC_OR_DEPARTMENT_OTHER)
Admission: EM | Admit: 2023-03-07 | Discharge: 2023-03-09 | DRG: 638 | Disposition: A | Discharge status: Home / Self Care | Payer: Medicaid Other | Attending: Internal Medicine | Admitting: Internal Medicine

## 2023-03-07 DIAGNOSIS — Z794 Long term (current) use of insulin: Secondary | ICD-10-CM | POA: Diagnosis not present

## 2023-03-07 DIAGNOSIS — Z79899 Other long term (current) drug therapy: Secondary | ICD-10-CM

## 2023-03-07 DIAGNOSIS — K229 Disease of esophagus, unspecified: Secondary | ICD-10-CM | POA: Diagnosis present

## 2023-03-07 DIAGNOSIS — Z91148 Patient's other noncompliance with medication regimen for other reason: Secondary | ICD-10-CM | POA: Diagnosis not present

## 2023-03-07 DIAGNOSIS — N179 Acute kidney failure, unspecified: Secondary | ICD-10-CM | POA: Diagnosis present

## 2023-03-07 DIAGNOSIS — D72829 Elevated white blood cell count, unspecified: Secondary | ICD-10-CM | POA: Diagnosis present

## 2023-03-07 DIAGNOSIS — R112 Nausea with vomiting, unspecified: Secondary | ICD-10-CM

## 2023-03-07 DIAGNOSIS — E1010 Type 1 diabetes mellitus with ketoacidosis without coma: Principal | ICD-10-CM | POA: Diagnosis present

## 2023-03-07 DIAGNOSIS — Z8701 Personal history of pneumonia (recurrent): Secondary | ICD-10-CM | POA: Diagnosis not present

## 2023-03-07 DIAGNOSIS — N3289 Other specified disorders of bladder: Secondary | ICD-10-CM | POA: Diagnosis present

## 2023-03-07 DIAGNOSIS — Z87891 Personal history of nicotine dependence: Secondary | ICD-10-CM

## 2023-03-07 DIAGNOSIS — Z8249 Family history of ischemic heart disease and other diseases of the circulatory system: Secondary | ICD-10-CM | POA: Diagnosis not present

## 2023-03-07 DIAGNOSIS — R7989 Other specified abnormal findings of blood chemistry: Secondary | ICD-10-CM | POA: Diagnosis present

## 2023-03-07 DIAGNOSIS — E860 Dehydration: Secondary | ICD-10-CM | POA: Diagnosis present

## 2023-03-07 DIAGNOSIS — R000 Tachycardia, unspecified: Secondary | ICD-10-CM | POA: Diagnosis present

## 2023-03-07 LAB — I-STAT VENOUS BLOOD GAS, ED
Acid-base deficit: 8 mmol/L — ABNORMAL HIGH (ref 0.0–2.0)
Bicarbonate: 18.7 mmol/L — ABNORMAL LOW (ref 20.0–28.0)
Calcium, Ion: 1.2 mmol/L (ref 1.15–1.40)
HCT: 51 % (ref 39.0–52.0)
Hemoglobin: 17.3 g/dL — ABNORMAL HIGH (ref 13.0–17.0)
O2 Saturation: 55 %
Patient temperature: 97.2
Potassium: 5.1 mmol/L (ref 3.5–5.1)
Sodium: 132 mmol/L — ABNORMAL LOW (ref 135–145)
TCO2: 20 mmol/L — ABNORMAL LOW (ref 22–32)
pCO2, Ven: 41.1 mmHg — ABNORMAL LOW (ref 44–60)
pH, Ven: 7.262 (ref 7.25–7.43)
pO2, Ven: 32 mmHg (ref 32–45)

## 2023-03-07 LAB — GLUCOSE, CAPILLARY
Glucose-Capillary: 186 mg/dL — ABNORMAL HIGH (ref 70–99)
Glucose-Capillary: 189 mg/dL — ABNORMAL HIGH (ref 70–99)
Glucose-Capillary: 212 mg/dL — ABNORMAL HIGH (ref 70–99)
Glucose-Capillary: 223 mg/dL — ABNORMAL HIGH (ref 70–99)
Glucose-Capillary: 226 mg/dL — ABNORMAL HIGH (ref 70–99)

## 2023-03-07 LAB — COMPREHENSIVE METABOLIC PANEL
ALT: 25 U/L (ref 0–44)
AST: 16 U/L (ref 15–41)
Albumin: 5.5 g/dL — ABNORMAL HIGH (ref 3.5–5.0)
Alkaline Phosphatase: 86 U/L (ref 38–126)
Anion gap: 33 — ABNORMAL HIGH (ref 5–15)
BUN: 45 mg/dL — ABNORMAL HIGH (ref 6–20)
CO2: 15 mmol/L — ABNORMAL LOW (ref 22–32)
Calcium: 11.5 mg/dL — ABNORMAL HIGH (ref 8.9–10.3)
Chloride: 88 mmol/L — ABNORMAL LOW (ref 98–111)
Creatinine, Ser: 2.31 mg/dL — ABNORMAL HIGH (ref 0.61–1.24)
GFR, Estimated: 38 mL/min — ABNORMAL LOW (ref >60)
Glucose, Bld: 482 mg/dL — ABNORMAL HIGH (ref 70–99)
Potassium: 4.3 mmol/L (ref 3.5–5.1)
Sodium: 136 mmol/L (ref 135–145)
Total Bilirubin: 0.6 mg/dL (ref 0.3–1.2)
Total Protein: 9.5 g/dL — ABNORMAL HIGH (ref 6.5–8.1)

## 2023-03-07 LAB — LACTIC ACID, PLASMA
Lactic Acid, Venous: 2.3 mmol/L (ref 0.5–1.9)
Lactic Acid, Venous: 1.4 mmol/L (ref 0.5–1.9)

## 2023-03-07 LAB — MRSA NEXT GEN BY PCR, NASAL: MRSA by PCR Next Gen: NOT DETECTED

## 2023-03-07 LAB — CBC WITH DIFFERENTIAL/PLATELET
Abs Immature Granulocytes: 0.12 10*3/uL — ABNORMAL HIGH (ref 0.00–0.07)
Basophils Absolute: 0.1 10*3/uL (ref 0.0–0.1)
Basophils Relative: 0 %
Eosinophils Absolute: 0.1 10*3/uL (ref 0.0–0.5)
Eosinophils Relative: 1 %
HCT: 50.4 % (ref 39.0–52.0)
Hemoglobin: 17.7 g/dL — ABNORMAL HIGH (ref 13.0–17.0)
Immature Granulocytes: 1 %
Lymphocytes Relative: 9 %
Lymphs Abs: 1.9 10*3/uL (ref 0.7–4.0)
MCH: 29.5 pg (ref 26.0–34.0)
MCHC: 35.1 g/dL (ref 30.0–36.0)
MCV: 83.9 fL (ref 80.0–100.0)
Monocytes Absolute: 2 10*3/uL — ABNORMAL HIGH (ref 0.1–1.0)
Monocytes Relative: 9 %
Neutro Abs: 17.6 10*3/uL — ABNORMAL HIGH (ref 1.7–7.7)
Neutrophils Relative %: 80 %
Platelets: 425 10*3/uL — ABNORMAL HIGH (ref 150–400)
RBC: 6.01 MIL/uL — ABNORMAL HIGH (ref 4.22–5.81)
RDW: 13.2 % (ref 11.5–15.5)
WBC: 21.8 10*3/uL — ABNORMAL HIGH (ref 4.0–10.5)
nRBC: 0 % (ref 0.0–0.2)

## 2023-03-07 LAB — BETA-HYDROXYBUTYRIC ACID
Beta-Hydroxybutyric Acid: >8 mmol/L — ABNORMAL HIGH (ref 0.05–0.27)
Beta-Hydroxybutyric Acid: 5.84 mmol/L — ABNORMAL HIGH (ref 0.05–0.27)

## 2023-03-07 LAB — BASIC METABOLIC PANEL
Anion gap: 16 — ABNORMAL HIGH (ref 5–15)
BUN: 34 mg/dL — ABNORMAL HIGH (ref 6–20)
CO2: 20 mmol/L — ABNORMAL LOW (ref 22–32)
Calcium: 9.3 mg/dL (ref 8.9–10.3)
Chloride: 103 mmol/L (ref 98–111)
Creatinine, Ser: 1.46 mg/dL — ABNORMAL HIGH (ref 0.61–1.24)
GFR, Estimated: >60 mL/min (ref >60)
Glucose, Bld: 238 mg/dL — ABNORMAL HIGH (ref 70–99)
Potassium: 4.2 mmol/L (ref 3.5–5.1)
Sodium: 139 mmol/L (ref 135–145)

## 2023-03-07 LAB — LIPASE, BLOOD: Lipase: 17 U/L (ref 11–51)

## 2023-03-07 LAB — CBG MONITORING, ED
Glucose-Capillary: 463 mg/dL — ABNORMAL HIGH (ref 70–99)
Glucose-Capillary: 313 mg/dL — ABNORMAL HIGH (ref 70–99)

## 2023-03-07 LAB — MAGNESIUM: Magnesium: 2.5 mg/dL — ABNORMAL HIGH (ref 1.7–2.4)

## 2023-03-07 MED ORDER — CHLORHEXIDINE GLUCONATE CLOTH 2 % EX PADS: 6.0000 | MEDICATED_PAD | Freq: Every day | CUTANEOUS | Status: DC

## 2023-03-07 MED ORDER — ORAL CARE MOUTH RINSE: 15.0000 mL | OROMUCOSAL | Status: DC | PRN

## 2023-03-07 MED ORDER — POLYETHYLENE GLYCOL 3350 17 G PO PACK: 17.0000 g | PACK | Freq: Every day | ORAL | Status: DC | PRN

## 2023-03-07 MED ORDER — MELATONIN 5 MG PO TABS: 5.0000 mg | ORAL_TABLET | Freq: Every evening | ORAL | Status: DC | PRN

## 2023-03-07 MED ORDER — POTASSIUM CHLORIDE 10 MEQ/100ML IV SOLN
10.0000 meq | INTRAVENOUS | Status: AC
  Administered 2023-03-07 (×2): 10 meq via INTRAVENOUS
  Filled 2023-03-07 (×2): qty 100

## 2023-03-07 MED ORDER — DEXTROSE 50 % IV SOLN: 0.0000 mL | INTRAVENOUS | Status: DC | PRN

## 2023-03-07 MED ORDER — METOCLOPRAMIDE HCL 5 MG/ML IJ SOLN
10.0000 mg | Freq: Once | INTRAMUSCULAR | Status: AC
  Administered 2023-03-07: 10 mg via INTRAVENOUS
  Filled 2023-03-07: qty 2

## 2023-03-07 MED ORDER — LACTATED RINGERS IV BOLUS
2000.0000 mL | INTRAVENOUS | Status: AC
  Administered 2023-03-07 (×2): 2000 mL via INTRAVENOUS

## 2023-03-07 MED ORDER — PANTOPRAZOLE SODIUM 40 MG IV SOLR
40.0000 mg | Freq: Two times a day (BID) | INTRAVENOUS | Status: DC
  Administered 2023-03-07 – 2023-03-08 (×2): 40 mg via INTRAVENOUS
  Filled 2023-03-07 (×2): qty 10

## 2023-03-07 MED ORDER — ONDANSETRON HCL 4 MG/2ML IJ SOLN
4.0000 mg | Freq: Once | INTRAMUSCULAR | Status: AC
  Administered 2023-03-07: 4 mg via INTRAVENOUS
  Filled 2023-03-07: qty 2

## 2023-03-07 MED ORDER — ENOXAPARIN SODIUM 40 MG/0.4ML IJ SOSY
40.0000 mg | PREFILLED_SYRINGE | INTRAMUSCULAR | Status: DC
  Administered 2023-03-07 – 2023-03-08 (×2): 40 mg via SUBCUTANEOUS
  Filled 2023-03-07 (×2): qty 0.4

## 2023-03-07 MED ORDER — DEXTROSE IN LACTATED RINGERS 5 % IV SOLN: INTRAVENOUS | Status: DC

## 2023-03-07 MED ORDER — DIPHENHYDRAMINE HCL 50 MG/ML IJ SOLN
25.0000 mg | Freq: Once | INTRAMUSCULAR | Status: AC
  Administered 2023-03-07: 25 mg via INTRAVENOUS
  Filled 2023-03-07: qty 1

## 2023-03-07 MED ORDER — INSULIN REGULAR(HUMAN) IN NACL 100-0.9 UT/100ML-% IV SOLN
INTRAVENOUS | Status: DC
  Administered 2023-03-07: 7 [IU]/h via INTRAVENOUS
  Filled 2023-03-07: qty 100

## 2023-03-07 MED ORDER — PROCHLORPERAZINE EDISYLATE 10 MG/2ML IJ SOLN
5.0000 mg | Freq: Four times a day (QID) | INTRAMUSCULAR | Status: DC | PRN
  Administered 2023-03-07 – 2023-03-08 (×2): 5 mg via INTRAVENOUS
  Filled 2023-03-07 (×2): qty 2

## 2023-03-07 MED ORDER — LACTATED RINGERS IV SOLN: INTRAVENOUS | Status: DC

## 2023-03-07 MED ORDER — ACETAMINOPHEN 325 MG PO TABS: 650.0000 mg | ORAL_TABLET | Freq: Four times a day (QID) | ORAL | Status: DC | PRN

## 2023-03-07 NOTE — H&P (Addendum)
History and Physical  Joseph Barr:[id number] DOB: 05/17/1996 DOA: 03/07/2023  Referring physician: Accepted by Dr. [place], [facility], Hospitalist service.  PCP: Claiborne Rigg, NP  Outpatient Specialists: None Patient coming from: Home through [facility]  Chief Complaint: DKA type 1  HPI: Joseph Barr is a 27 y.o. male with medical history significant for poorly controlled type 1 diabetes with multiple hospitalizations, who initially presented to [facility] ED with complaints of nausea vomiting generalized abdominal pain for the past 2 days.  No reported subjective fevers or chills.  States he did not run out of his insulin.  In the ED, vital signs notable for hypotension responsive to IV fluid resuscitation, tachycardia, with lab studies remarkable for leukocytosis 21.8, hyperglycemia, anion gap metabolic acidosis with high anion gap of 33, elevated creatinine, elevated lactic acid.  Chest x-ray was unrevealing.  Noncontrast CT abdomen and pelvis revealed findings suggestive of bladder outlet obstruction due to marked urinary bladder distention.  Also findings suggestive of possible severe reflux esophagitis, endoscopic examination as clinically warranted should be considered.  The patient was promptly started on DKA type I protocol in the ED.  [facility], hospitalist service, was asked to admit.  The patient was accepted and admitted by Dr. [place], [name]  Transferred to [name] stepdown unit as inpatient status.  ED Course: Temperature 98.5.  BP 116/67, pulse 131, respiration rate 17, saturation 98% on room air.  Lab studies notable for serum sodium 132, potassium 5.1, serum bicarb 15, serum glucose 482, BUN 45, creatinine 2.31, anion gap 33, calcium 11.5, magnesium 2.5, GFR 38.  Review of Systems: Review of systems as noted in the HPI. All other systems reviewed and are negative.   Past Medical History:  Diagnosis Date   Diabetes mellitus    History reviewed. No pertinent  surgical history.  Social History:  reports that he has quit smoking. His smoking use included e-cigarettes. He has quit using smokeless tobacco. He reports current drug use. Drug: Marijuana. He reports that he does not drink alcohol.   No Known Allergies  Family History  Problem Relation Age of Onset   Cancer Maternal Grandfather    Hypertension Mother    Diabetes Neg Hx       Prior to Admission medications   Medication Sig Start Date End Date Taking? Authorizing Provider  acetaminophen (TYLENOL) 500 MG tablet Take 500 mg by mouth every 6 (six) hours as needed for headache.    [provider]  Blood Glucose Monitoring Suppl (TRUE METRIX METER) w/Device KIT Use as directed 03/06/22   Lonia Blood, MD  glucose blood (TRUE METRIX BLOOD GLUCOSE TEST) test strip Use as instructed 01/08/21   Rema Fendt, NP  glucose blood test strip use as directed to test blood sugars four times a day 03/06/22   Lonia Blood, MD  insulin glargine (LANTUS) 100 UNIT/ML Solostar Pen Inject 18 Units into the skin 2 (two) times daily. 03/06/22   Lonia Blood, MD  insulin lispro (HUMALOG) 100 UNIT/ML injection Inject 0.05 mLs (5 Units total) into the skin 3 (three) times daily with meals. 03/06/22   Lonia Blood, MD  Insulin Pen Needle 31G X 5 MM MISC Use to inject Lantus twice daily. 03/06/22   Lonia Blood, MD  Insulin Syringe-Needle U-100 31G X 15/64" 0.3 ML MISC Use to inject Humalog 3x daily. Keep upcoming appt for additional refills. 03/06/22   Lonia Blood, MD  magnesium oxide (MAG-OX) 400 (  240 Mg) MG tablet Take 1 tablet (400 mg total) by mouth 2 (two) times daily. 01/17/23   Rai, Ripudeep K, MD  ondansetron (ZOFRAN) 4 MG tablet Take 1 tablet (4 mg total) by mouth every 6 (six) hours as needed for nausea. 01/17/23   Rai, Delene Ruffini, MD  TRUEplus Lancets 28G MISC use as directed to test blood sugars four times a day 03/06/22   Lonia Blood, MD    Physical Exam: BP  121/79   Pulse (!) 120   Temp (!) 97.2 F (36.2 C) (Oral)   Resp 18   Ht 6' (1.829 m)   Wt 68 kg   SpO2 100%   BMI 20.34 kg/m   General: 27 y.o. year-old male well developed well nourished in no acute distress.  Alert and oriented x3. Cardiovascular: Regular rate and rhythm with no rubs or gallops.  No thyromegaly or JVD noted.  No lower extremity edema. 2/4 pulses in all 4 extremities. Respiratory: Clear to auscultation with no wheezes or rales. Good inspiratory effort. Abdomen: Soft nontender nondistended with normal bowel sounds x4 quadrants. Muskuloskeletal: No cyanosis, clubbing or edema noted bilaterally Neuro: CN II-XII intact, strength, sensation, reflexes Skin: No ulcerative lesions noted or rashes Psychiatry: Judgement and insight appear normal. Mood is appropriate for condition and setting          Labs on Admission:  Basic Metabolic Panel: Recent Labs  Lab 03/07/23 1605 03/07/23 1621  NA 136 132*  K 4.3 5.1  CL 88*  --   CO2 15*  --   GLUCOSE 482*  --   BUN 45*  --   CREATININE 2.31*  --   CALCIUM 11.5*  --   MG 2.5*  --    Liver Function Tests: Recent Labs  Lab 03/07/23 1605  AST 16  ALT 25  ALKPHOS 86  BILITOT 0.6  PROT 9.5*  ALBUMIN 5.5*   Recent Labs  Lab 03/07/23 1605  LIPASE 17   No results for input(s): "AMMONIA" in the last 168 hours. CBC: Recent Labs  Lab 03/07/23 1605 03/07/23 1621  WBC 21.8*  --   NEUTROABS 17.6*  --   HGB 17.7* 17.3*  HCT 50.4 51.0  MCV 83.9  --   PLT 425*  --    Cardiac Enzymes: No results for input(s): "CKTOTAL", "CKMB", "CKMBINDEX", "TROPONINI" in the last 168 hours.  BNP (last 3 results) No results for input(s): "BNP" in the last 8760 hours.  ProBNP (last 3 results) No results for input(s): "PROBNP" in the last 8760 hours.  CBG: Recent Labs  Lab 03/07/23 1608 03/07/23 1754  GLUCAP 463* 313*    Radiological Exams on Admission: CT ABDOMEN PELVIS WO CONTRAST  Result Date:  03/07/2023 CLINICAL DATA:  Abdominal pain, nausea, vomiting EXAM: CT ABDOMEN AND PELVIS WITHOUT CONTRAST TECHNIQUE: Multidetector CT imaging of the abdomen and pelvis was performed following the standard protocol without IV contrast. RADIATION DOSE REDUCTION: This exam was performed according to the departmental dose-optimization program which includes automated exposure control, adjustment of the mA and/or kV according to patient size and/or use of iterative reconstruction technique. COMPARISON:  01/15/2023 FINDINGS: Lower chest: Visualized lower lung fields are clear. Left hemidiaphragm is elevated. Hepatobiliary: No focal abnormalities are seen in liver. Gallbladder is distended. There is no wall thickening in gallbladder. There is no fluid around the gallbladder. There is no dilation of bile ducts. Pancreas: No focal abnormalities are seen. Spleen: Unremarkable. Adrenals/Urinary Tract: Adrenals are unremarkable. There is no  hydronephrosis. There are no renal or ureteral stones. Urinary bladder is markedly distended. Bladder measures 19 cm in superior-inferior length. Upper margin of the bladder is extending to the level of umbilicus. There is no wall thickening in the bladder. Stomach/Bowel: There is small hiatal hernia. There is marked wall thickening in the lower thoracic esophagus. This may be due to severe reflux esophagitis or some other inflammatory or neoplastic process. There is no significant distention of stomach. Small bowel loops are unremarkable. Appendix is not seen. There is no pericecal inflammation. There is no significant wall thickening in colon. There is no pericolic stranding. Vascular/Lymphatic: Vascular structures are unremarkable. There are few scattered subcentimeter nodes in the mesentery. Reproductive: Unremarkable. Other: There is no ascites or pneumoperitoneum. Musculoskeletal: No acute findings are seen. IMPRESSION: There is no evidence of intestinal obstruction or pneumoperitoneum.  There is no hydronephrosis. There is marked distention of urinary bladder. Possibility of bladder outlet obstruction is not excluded. There is marked wall thickening in the visualized lower thoracic esophagus suggesting possible severe reflux esophagitis. Endoscopic examination as clinically warranted should be considered. Electronically Signed   By: Ernie Avena M.D.   On: 03/07/2023 18:42   DG Chest Port 1 View  Result Date: 03/07/2023 CLINICAL DATA:  Sirs, sepsis EXAM: PORTABLE CHEST 1 VIEW COMPARISON:  01/15/2023 FINDINGS: The heart size and mediastinal contours are within normal limits. Both lungs are clear. The visualized skeletal structures are unremarkable. IMPRESSION: No active disease. Electronically Signed   By: Charlett Nose M.D.   On: 03/07/2023 17:14    EKG: I independently viewed the EKG done and my findings are as followed: None available at the time of this visit.  Assessment/Plan Present on Admission:  Diabetic ketoacidosis associated with type 1 diabetes mellitus (HCC)  Principal Problem:   Diabetic ketoacidosis associated with type 1 diabetes mellitus (HCC)  DKA type I Unclear trigger DKA type I protocol in place Transition to subcu insulin when indicated. BMP every 4 hours x 2 Beta-hydroxybutyrate acid every 4 hours x 2 Diabetes coordinator Continue IV fluid hydration Last hemoglobin A1c greater than 15.5 in May 2024. IV antiemetics as needed Chest x-ray nonacute. UA is pending Noncontrast CT abdomen and pelvis revealed findings suggestive of bladder outlet obstruction due to marked urinary bladder distention.  Also findings suggestive of possible severe reflux esophagitis, endoscopic examination as clinically warranted should be considered.  AKI in the setting of dehydration from hyperglycemia and DKA Presented with creatinine of 2.31 and GFR 38 Normal renal function at baseline Avoid nephrotoxic agents, dehydration and hypotension Monitor urine  output Continue IV fluid hydration.  High anion gap metabolic acidosis in the setting of DKA Presented with serum bicarb of 13 and anion gap of 33 Continue to treat underlying conditions.  Marked urinary bladder distention suggestive of acute urinary retention, seen on CT scan Obtain bladder scan In and out Foley cath if needed  Possible severe reflux esophagitis seen on CT scan IV Protonix 40 mg twice daily Consider GI consult in the morning No evidence of perforation on CT scan  Leukocytosis, suspect reactive in the setting of DKA Presented with WBC of 21.8 K. Nonseptic appearing.  Afebrile.  No evidence of active infective process at this time. Follow UA Hold off antibiotics for now and trend WBC. Repeat CBC in the morning  Hypercalcemia, suspect from dehydration in the setting of DKA and hyperglycemia. Presented with calcium 11.5 Continue IV fluid hydration Repeat chemistry panel.   Time: 75 minutes.  DVT prophylaxis: Subcu Lovenox daily  Code Status: Full code  Family Communication: Patient's girlfriend at bedside  Disposition Plan: Admitted to stepdown unit  Consults called: Diabetes coordinator  Admission status: Inpatient status   Status is: Inpatient The patient requires at least 2 midnights for further evaluation and treatment of present condition.   Darlin Drop MD [facility] Pager [phone]  If 7PM-7AM, please contact night-coverage [email or web address] Password [email or web address]  03/07/2023, 8:06 PM

## 2023-03-07 NOTE — ED Notes (Signed)
Attempt to call report.  Floor RN unavailable.  States will call back  [facility] at bedside.

## 2023-03-07 NOTE — ED Triage Notes (Signed)
Pt has had emesis since yesterday morning. Pt has been unable to keep down food/drink.

## 2023-03-07 NOTE — ED Provider Notes (Signed)
[place] EMERGENCY DEPARTMENT AT [facility] Provider Note   CSN: [id number] Arrival date & time: 03/07/23  1502     History  Chief Complaint  Patient presents with   Emesis    Joseph Barr is a 27 y.o. male.  Patient with history of insulin-dependent diabetes, frequent admissions for DKA, history of pneumonia --presents to the emergency department for evaluation of vomiting ongoing over the past couple of days.  Reports generalized abdominal pain.  No documented fevers.  He states that he has been compliant with his insulin and has not run out.         Home Medications Prior to Admission medications   Medication Sig Start Date End Date Taking? Authorizing Provider  acetaminophen (TYLENOL) 500 MG tablet Take 500 mg by mouth every 6 (six) hours as needed for headache.    [provider]  Blood Glucose Monitoring Suppl (TRUE METRIX METER) w/Device KIT Use as directed 03/06/22   Lonia Blood, MD  glucose blood (TRUE METRIX BLOOD GLUCOSE TEST) test strip Use as instructed 01/08/21   Rema Fendt, NP  glucose blood test strip use as directed to test blood sugars four times a day 03/06/22   Lonia Blood, MD  insulin glargine (LANTUS) 100 UNIT/ML Solostar Pen Inject 18 Units into the skin 2 (two) times daily. 03/06/22   Lonia Blood, MD  insulin lispro (HUMALOG) 100 UNIT/ML injection Inject 0.05 mLs (5 Units total) into the skin 3 (three) times daily with meals. 03/06/22   Lonia Blood, MD  Insulin Pen Needle 31G X 5 MM MISC Use to inject Lantus twice daily. 03/06/22   Lonia Blood, MD  Insulin Syringe-Needle U-100 31G X 15/64" 0.3 ML MISC Use to inject Humalog 3x daily. Keep upcoming appt for additional refills. 03/06/22   Lonia Blood, MD  magnesium oxide (MAG-OX) 400 (240 Mg) MG tablet Take 1 tablet (400 mg total) by mouth 2 (two) times daily. 01/17/23   Rai, Ripudeep K, MD  ondansetron (ZOFRAN) 4 MG tablet Take 1 tablet (4 mg total) by  mouth every 6 (six) hours as needed for nausea. 01/17/23   Rai, Delene Ruffini, MD  TRUEplus Lancets 28G MISC use as directed to test blood sugars four times a day 03/06/22   Lonia Blood, MD      Allergies    Patient has no known allergies.    Review of Systems   Review of Systems  Physical Exam Updated Vital Signs BP (!) 76/45 (BP Location: Right Arm)   Pulse (!) 120   Temp (!) 97.2 F (36.2 C) (Oral)   Resp (!) 22   Ht 6' (1.829 m)   Wt 68 kg   SpO2 100%   BMI 20.34 kg/m  Physical Exam Vitals and nursing note reviewed.  Constitutional:      General: He is not in acute distress.    Appearance: He is well-developed. He is ill-appearing.  HENT:     Head: Normocephalic and atraumatic.     Mouth/Throat:     Mouth: Mucous membranes are dry.     Comments: Dry mucous membranes Eyes:     General:        Right eye: No discharge.        Left eye: No discharge.     Conjunctiva/sclera: Conjunctivae normal.  Cardiovascular:     Rate and Rhythm: Regular rhythm. Tachycardia present.     Heart sounds: Normal heart sounds.  Comments: Tachycardia noted Pulmonary:     Effort: Pulmonary effort is normal.     Breath sounds: Normal breath sounds.     Comments: No Kussmaul respirations Abdominal:     Palpations: Abdomen is soft.     Tenderness: There is abdominal tenderness. There is no guarding or rebound.     Comments: Generalized tenderness to palpation  Musculoskeletal:     Cervical back: Normal range of motion and neck supple.  Skin:    General: Skin is warm and dry.     Findings: Lesion present.     Comments: Several scattered lesions, especially on face  Neurological:     Mental Status: He is alert.     ED Results / Procedures / Treatments   Labs (all labs ordered are listed, but only abnormal results are displayed) Labs Reviewed  CBC WITH DIFFERENTIAL/PLATELET - Abnormal; Notable for the following components:      Result Value   WBC 21.8 (*)    RBC 6.01 (*)     Hemoglobin 17.7 (*)    Platelets 425 (*)    Neutro Abs 17.6 (*)    Monocytes Absolute 2.0 (*)    Abs Immature Granulocytes 0.12 (*)    All other components within normal limits  COMPREHENSIVE METABOLIC PANEL - Abnormal; Notable for the following components:   Chloride 88 (*)    CO2 15 (*)    Glucose, Bld 482 (*)    BUN 45 (*)    Creatinine, Ser 2.31 (*)    Calcium 11.5 (*)    Total Protein 9.5 (*)    Albumin 5.5 (*)    GFR, Estimated 38 (*)    Anion gap 33 (*)    All other components within normal limits  MAGNESIUM - Abnormal; Notable for the following components:   Magnesium 2.5 (*)    All other components within normal limits  LACTIC ACID, PLASMA - Abnormal; Notable for the following components:   Lactic Acid, Venous 2.3 (*)    All other components within normal limits  CBG MONITORING, ED - Abnormal; Notable for the following components:   Glucose-Capillary 463 (*)    All other components within normal limits  CBG MONITORING, ED - Abnormal; Notable for the following components:   Glucose-Capillary 313 (*)    All other components within normal limits  I-STAT VENOUS BLOOD GAS, ED - Abnormal; Notable for the following components:   pCO2, Ven 41.1 (*)    Bicarbonate 18.7 (*)    TCO2 20 (*)    Acid-base deficit 8.0 (*)    Sodium 132 (*)    Hemoglobin 17.3 (*)    All other components within normal limits  CULTURE, BLOOD (ROUTINE X 2)  CULTURE, BLOOD (ROUTINE X 2)  LIPASE, BLOOD  URINALYSIS, ROUTINE W REFLEX MICROSCOPIC  BETA-HYDROXYBUTYRIC ACID  LACTIC ACID, PLASMA    EKG None  Radiology DG Chest Port 1 View  Result Date: 03/07/2023 CLINICAL DATA:  Sirs, sepsis EXAM: PORTABLE CHEST 1 VIEW COMPARISON:  01/15/2023 FINDINGS: The heart size and mediastinal contours are within normal limits. Both lungs are clear. The visualized skeletal structures are unremarkable. IMPRESSION: No active disease. Electronically Signed   By: Charlett Nose M.D.   On: 03/07/2023 17:14     Procedures Procedures    Medications Ordered in ED Medications  insulin regular, human (MYXREDLIN) 100 units/ 100 mL infusion (7 Units/hr Intravenous New Bag/Given 03/07/23 1758)  lactated ringers infusion ( Intravenous New Bag/Given 03/07/23 1758)  dextrose 5 %  in lactated ringers infusion (has no administration in time range)  dextrose 50 % solution 0-50 mL (has no administration in time range)  potassium chloride 10 mEq in 100 mL IVPB (10 mEq Intravenous New Bag/Given 03/07/23 1750)  lactated ringers bolus 2,000 mL (2,000 mLs Intravenous New Bag/Given 03/07/23 1658)  ondansetron (ZOFRAN) injection 4 mg (4 mg Intravenous Given 03/07/23 1619)  metoCLOPramide (REGLAN) injection 10 mg (10 mg Intravenous Given 03/07/23 1802)  diphenhydrAMINE (BENADRYL) injection 25 mg (25 mg Intravenous Given 03/07/23 1802)    ED Course/ Medical Decision Making/ A&P    Patient seen and examined. History obtained directly from patient and also reviewed previous history as patient does not contribute a lot to his current history.  Labs/EKG: Ordered hyperglycemia/DKA as well as sepsis evaluation  Imaging: Ordered chest x-ray.  Medications/Fluids: Ordered: 2 L lactated Ringer's, Zofran.  Most recent vital signs reviewed and are as follows: BP (!) 76/45 (BP Location: Right Arm)   Pulse (!) 120   Temp (!) 97.2 F (36.2 C) (Oral)   Resp (!) 22   Ht 6' (1.829 m)   Wt 68 kg   SpO2 100%   BMI 20.34 kg/m   Initial impression: Nausea and vomiting, SIRS criteria, evaluation for diabetic related emergency versus abdominal etiology versus sepsis.  5:51 PM Reassessment performed. Patient appears stable.  Still tachycardic.  Labs personally reviewed and interpreted including: CBC with elevated white blood cell count at 21.8, hemoglobin elevated at 17.7 suggesting hemoconcentration; CMP with blood sugar 482, bicarb 15, anion gap elevated at 33, BUN/creatinine 45/2.31, normal potassium; VBG pH was normal at 7.26 with  bicarb 18.7, potassium 5.1; lactate minimally elevated at 2.3; blood cultures pending, magnesium 2.5.  Imaging personally visualized and interpreted including: Chest x-ray, agree no pneumonia.  Reviewed pertinent lab work and imaging with patient at bedside. Questions answered.   Most current vital signs reviewed and are as follows: BP 109/83 (BP Location: Right Arm)   Pulse (!) 121   Temp (!) 97.2 F (36.2 C) (Oral)   Resp 15   Ht 6' (1.829 m)   Wt 68 kg   SpO2 100%   BMI 20.34 kg/m   Plan: Will start IV insulin due to concern for diabetic ketoacidosis.  Patient with additional episodes of vomiting, Reglan and Benadryl ordered.  He will be admitted for DKA.  Considered administration of IV antibiotics, CT of the abdomen pelvis.  No focal pain on reexam.  Will discuss with hospitalist service.  6:04 PM discussed with Dr. [place] of [facility] hospitalist.  He accepts patient for admission.  Agrees to hold antibiotics at this time.  Would asked that we obtain CT imaging of the abdomen pelvis to evaluate for any issues contributing of the patient's DKA.  This has been ordered.  Reviewed pertinent lab work and imaging with patient at bedside. Questions answered.   Most current vital signs reviewed and are as follows: BP 121/79   Pulse (!) 120   Temp (!) 97.2 F (36.2 C) (Oral)   Resp 18   Ht 6' (1.829 m)   Wt 68 kg   SpO2 100%   BMI 20.34 kg/m   Plan: Admit to hospital.   CRITICAL CARE Performed by: Renne Crigler PA-C Total critical care time: 40 minutes Critical care time was exclusive of separately billable procedures and treating other patients. Critical care was necessary to treat or prevent imminent or life-threatening deterioration. Critical care was time spent personally by me on the following activities:  development of treatment plan with patient and/or surrogate as well as nursing, discussions with consultants, evaluation of patient's response to treatment, examination  of patient, obtaining history from patient or surrogate, ordering and performing treatments and interventions, ordering and review of laboratory studies, ordering and review of radiographic studies, pulse oximetry and re-evaluation of patient's condition.                            Medical Decision Making Amount and/or Complexity of Data Reviewed Labs: ordered. Radiology: ordered.  Risk Prescription drug management. Decision regarding hospitalization.   Admit for DKA and acute kidney injury, persistent nausea and vomiting and worsening dehydration.        Final Clinical Impression(s) / ED Diagnoses Final diagnoses:  Diabetic ketoacidosis without coma associated with type 1 diabetes mellitus (HCC)  AKI (acute kidney injury) (HCC)  Nausea and vomiting, unspecified vomiting type    Rx / DC Orders ED Discharge Orders     None         Renne Crigler, PA-C 03/07/23 1806    Virgina Norfolk, DO 03/07/23 1818

## 2023-03-07 NOTE — Progress Notes (Signed)
Plan of Care Note for accepted transfer   Patient: MARGUES EXUME MRN: [id number]   DOA: 03/07/2023  Facility requesting transfer: [facility] Requesting Provider: Rhea Bleacher, PA Reason for transfer: DKA Facility course:   GRADEY KNIERIM is a 27 year old male with past medical history significant for type 1 diabetes mellitus that is poorly controlled with multiple hospitalizations who presented to [facility] ED with nausea, vomiting and abdominal pain.  Workup in the ED with tachycardia, initial blood pressure of 76/45 responsive to IV fluid resuscitation.  WBC count 21.8, glucose 482 with an anion gap of 33.  Creatinine 2.31.  Lactic acid 2.3.  Chest x-ray unrevealing.  Blood cultures obtained, urinalysis ordered.  Discussed with EDP to obtain CT abdomen/pelvis to ensure no other pathology present.  Started on insulin drip due to DKA and acute renal failure.   Plan of care: The patient is accepted for admission to [facility] unit, at [facility]..    Author: Alvira Philips [place], DO 03/07/2023  Check [email or web address] for on-call coverage.  Nursing staff, Please call [facility] Admits & Consults System-Wide number on Amion as soon as patient's arrival, so appropriate admitting provider can evaluate the pt.

## 2023-03-07 NOTE — ED Notes (Signed)
Patient transported to CT with RN on monitor.

## 2023-03-07 NOTE — ED Notes (Signed)
CRITICAL VALUE STICKER  CRITICAL VALUE:Lactate 2.3  RECEIVER (on-site recipient of call):Carmon Ginsberg, RN  DATE & TIME NOTIFIED:   MESSENGER (representative from lab):  MD NOTIFIED: Dr. Lockie Mola  TIME OF NOTIFICATION:1700  RESPONSE:

## 2023-03-07 NOTE — ED Notes (Signed)
[facility] called -- bed assignment ready; waiting for transport

## 2023-03-07 NOTE — ED Notes (Signed)
Unable to only obtain one blood culture after three sticks

## 2023-03-08 ENCOUNTER — Other Ambulatory Visit: Payer: Self-pay

## 2023-03-08 DIAGNOSIS — E1010 Type 1 diabetes mellitus with ketoacidosis without coma: Secondary | ICD-10-CM | POA: Diagnosis not present

## 2023-03-08 LAB — BASIC METABOLIC PANEL
Anion gap: 11 (ref 5–15)
Anion gap: 9 (ref 5–15)
Anion gap: 9 (ref 5–15)
BUN: 20 mg/dL (ref 6–20)
BUN: 25 mg/dL — ABNORMAL HIGH (ref 6–20)
BUN: 28 mg/dL — ABNORMAL HIGH (ref 6–20)
CO2: 23 mmol/L (ref 22–32)
CO2: 26 mmol/L (ref 22–32)
CO2: 26 mmol/L (ref 22–32)
Calcium: 9 mg/dL (ref 8.9–10.3)
Calcium: 9 mg/dL (ref 8.9–10.3)
Calcium: 9.2 mg/dL (ref 8.9–10.3)
Chloride: 105 mmol/L (ref 98–111)
Chloride: 106 mmol/L (ref 98–111)
Chloride: 106 mmol/L (ref 98–111)
Creatinine, Ser: 1.11 mg/dL (ref 0.61–1.24)
Creatinine, Ser: 1.16 mg/dL (ref 0.61–1.24)
Creatinine, Ser: 1.34 mg/dL — ABNORMAL HIGH (ref 0.61–1.24)
GFR, Estimated: >60 mL/min (ref >60)
GFR, Estimated: >60 mL/min (ref >60)
GFR, Estimated: >60 mL/min (ref >60)
Glucose, Bld: 108 mg/dL — ABNORMAL HIGH (ref 70–99)
Glucose, Bld: 141 mg/dL — ABNORMAL HIGH (ref 70–99)
Glucose, Bld: 178 mg/dL — ABNORMAL HIGH (ref 70–99)
Potassium: 3.4 mmol/L — ABNORMAL LOW (ref 3.5–5.1)
Potassium: 3.5 mmol/L (ref 3.5–5.1)
Potassium: 3.6 mmol/L (ref 3.5–5.1)
Sodium: 139 mmol/L (ref 135–145)
Sodium: 141 mmol/L (ref 135–145)
Sodium: 141 mmol/L (ref 135–145)

## 2023-03-08 LAB — CBC WITH DIFFERENTIAL/PLATELET
Abs Immature Granulocytes: 0.08 10*3/uL — ABNORMAL HIGH (ref 0.00–0.07)
Basophils Absolute: 0.1 10*3/uL (ref 0.0–0.1)
Basophils Relative: 0 %
Eosinophils Absolute: 0.1 10*3/uL (ref 0.0–0.5)
Eosinophils Relative: 1 %
HCT: 41.9 % (ref 39.0–52.0)
Hemoglobin: 14.2 g/dL (ref 13.0–17.0)
Immature Granulocytes: 0 %
Lymphocytes Relative: 9 %
Lymphs Abs: 1.6 10*3/uL (ref 0.7–4.0)
MCH: 29 pg (ref 26.0–34.0)
MCHC: 33.9 g/dL (ref 30.0–36.0)
MCV: 85.5 fL (ref 80.0–100.0)
Monocytes Absolute: 1.8 10*3/uL — ABNORMAL HIGH (ref 0.1–1.0)
Monocytes Relative: 9 %
Neutro Abs: 15.1 10*3/uL — ABNORMAL HIGH (ref 1.7–7.7)
Neutrophils Relative %: 81 %
Platelets: 315 10*3/uL (ref 150–400)
RBC: 4.9 MIL/uL (ref 4.22–5.81)
RDW: 13 % (ref 11.5–15.5)
WBC: 18.8 10*3/uL — ABNORMAL HIGH (ref 4.0–10.5)
nRBC: 0 % (ref 0.0–0.2)

## 2023-03-08 LAB — GLUCOSE, CAPILLARY
Glucose-Capillary: 100 mg/dL — ABNORMAL HIGH (ref 70–99)
Glucose-Capillary: 102 mg/dL — ABNORMAL HIGH (ref 70–99)
Glucose-Capillary: 103 mg/dL — ABNORMAL HIGH (ref 70–99)
Glucose-Capillary: 103 mg/dL — ABNORMAL HIGH (ref 70–99)
Glucose-Capillary: 108 mg/dL — ABNORMAL HIGH (ref 70–99)
Glucose-Capillary: 109 mg/dL — ABNORMAL HIGH (ref 70–99)
Glucose-Capillary: 110 mg/dL — ABNORMAL HIGH (ref 70–99)
Glucose-Capillary: 112 mg/dL — ABNORMAL HIGH (ref 70–99)
Glucose-Capillary: 113 mg/dL — ABNORMAL HIGH (ref 70–99)
Glucose-Capillary: 135 mg/dL — ABNORMAL HIGH (ref 70–99)
Glucose-Capillary: 136 mg/dL — ABNORMAL HIGH (ref 70–99)
Glucose-Capillary: 138 mg/dL — ABNORMAL HIGH (ref 70–99)
Glucose-Capillary: 168 mg/dL — ABNORMAL HIGH (ref 70–99)
Glucose-Capillary: 173 mg/dL — ABNORMAL HIGH (ref 70–99)
Glucose-Capillary: 72 mg/dL (ref 70–99)

## 2023-03-08 LAB — CULTURE, BLOOD (ROUTINE X 2): Special Requests: ADEQUATE

## 2023-03-08 LAB — CBC
HCT: 37.6 % — ABNORMAL LOW (ref 39.0–52.0)
Hemoglobin: 13.3 g/dL (ref 13.0–17.0)
MCH: 29.8 pg (ref 26.0–34.0)
MCHC: 35.4 g/dL (ref 30.0–36.0)
MCV: 84.1 fL (ref 80.0–100.0)
Platelets: 313 10*3/uL (ref 150–400)
RBC: 4.47 MIL/uL (ref 4.22–5.81)
RDW: 13 % (ref 11.5–15.5)
WBC: 15 10*3/uL — ABNORMAL HIGH (ref 4.0–10.5)
nRBC: 0 % (ref 0.0–0.2)

## 2023-03-08 LAB — HIV ANTIBODY (ROUTINE TESTING W REFLEX): HIV Screen 4th Generation wRfx: NONREACTIVE

## 2023-03-08 LAB — MAGNESIUM
Magnesium: 1.9 mg/dL (ref 1.7–2.4)
Magnesium: 1.9 mg/dL (ref 1.7–2.4)

## 2023-03-08 LAB — BETA-HYDROXYBUTYRIC ACID
Beta-Hydroxybutyric Acid: 3.21 mmol/L — ABNORMAL HIGH (ref 0.05–0.27)
Beta-Hydroxybutyric Acid: 1.55 mmol/L — ABNORMAL HIGH (ref 0.05–0.27)

## 2023-03-08 LAB — PHOSPHORUS
Phosphorus: 1.8 mg/dL — ABNORMAL LOW (ref 2.5–4.6)
Phosphorus: 2.6 mg/dL (ref 2.5–4.6)

## 2023-03-08 MED ORDER — INSULIN GLARGINE-YFGN 100 UNIT/ML ~~LOC~~ SOLN
10.0000 [IU] | Freq: Two times a day (BID) | SUBCUTANEOUS | Status: DC
  Administered 2023-03-08 – 2023-03-09 (×3): 10 [IU] via SUBCUTANEOUS
  Filled 2023-03-08 (×4): qty 0.1

## 2023-03-08 MED ORDER — PANTOPRAZOLE SODIUM 40 MG PO TBEC
40.0000 mg | DELAYED_RELEASE_TABLET | Freq: Two times a day (BID) | ORAL | Status: DC
  Administered 2023-03-08 – 2023-03-09 (×2): 40 mg via ORAL
  Filled 2023-03-08 (×2): qty 1

## 2023-03-08 MED ORDER — INSULIN ASPART 100 UNIT/ML IJ SOLN
0.0000 [IU] | Freq: Three times a day (TID) | INTRAMUSCULAR | Status: DC
  Administered 2023-03-09: 2 [IU] via SUBCUTANEOUS

## 2023-03-08 MED ORDER — INSULIN ASPART 100 UNIT/ML IJ SOLN: 0.0000 [IU] | Freq: Every day | INTRAMUSCULAR | Status: DC

## 2023-03-08 MED ORDER — INSULIN ASPART 100 UNIT/ML IJ SOLN
3.0000 [IU] | Freq: Three times a day (TID) | INTRAMUSCULAR | Status: DC
  Administered 2023-03-09: 3 [IU] via SUBCUTANEOUS

## 2023-03-08 MED ORDER — POTASSIUM CHLORIDE CRYS ER 20 MEQ PO TBCR
40.0000 meq | EXTENDED_RELEASE_TABLET | Freq: Once | ORAL | Status: AC
  Administered 2023-03-08: 40 meq via ORAL
  Filled 2023-03-08: qty 2

## 2023-03-08 NOTE — Progress Notes (Signed)
PROGRESS NOTE    Joseph Barr  ZOX:[id number] DOB: [birth year] DOA: 03/07/2023 PCP: Claiborne Rigg, NP    Brief Narrative:   Joseph Barr is a 27 y.o. male with past medical history significant for type 1 diabetes mellitus that is poorly controlled with multiple hospitalizations who presented to [facility] on 7/6 with nausea, vomiting and abdominal pain.  Patient reports he has been taking his insulin.  Denies fevers, no chills.  Denies any recent sick contacts.  Unclear etiology to his recurrent symptoms; but highly suspect medication noncompliance.  In the ED, temperature 97.2 F, HR 120, RR 22, BP 76/45, SpO2 100% on room air.  VBG with pH 7.262.  Sodium 136, potassium 4.3, chloride 88, CO2 15, glucose 482, BUN 45, creatinine 2.31.  Lipase 17.  AST 16, ALT 25, total bilirubin 0.6.  Lactic acid 2.3.  WBC 21.8, hemoglobin 17.7, platelet count 425.  MRSA PCR negative.  Beta hydroxybutyrate acid greater than 8.00.  Chest x-ray with no active cardiopulmonary disease process.  CT abdomen/pelvis with no evidence of intestinal obstruction/pneumoperitoneum, no hydronephrosis, distention of urinary bladder, thickening lower thoracic esophagus.  Assessment & Plan:   Diabetic ketoacidosis Type 1 diabetes mellitus Patient presenting to ED with nausea, vomiting, abdominal pain.  Glucose 42, anion gap 33, beta hydroxybutyrate acid greater than 8.  Patient reports compliance with his home insulin regimen, although multiple hospitalizations and elevated hemoglobin A1c would favor otherwise.  Outpatient regimen includes Lantus 18 units subcutaneously twice daily, Humalog 5 units 3 times daily before meals and sliding scale coverage.  Most recent hemoglobin A1c greater than 15.5 on 01/16/2023.  Received IV fluid bolus and started on insulin drip. -- Transition insulin drip to Semglee 10 u D[place]oll BID -- Novolog 3 u TID AC -- Sensitive SSI for coverage -- CBG before every meal/at bedtime -- Diabetic  educator consulted  Acute renal failure: Improving Creatinine elevated 2.31 on admission, likely secondary to prerenal azotemia in the setting of severe dehydration secondary to DKA as above. -- Cr 2.31>1.20 -- Repeat BMP in a.m.  Esophageal wall thickening Incidental finding of thickening of the lower thoracic esophagus on CT abdomen/pelvis.  Denies dysphagia symptoms. --Recommend outpatient follow-up with gastroenterology for consideration of EGD  Leukocytosis WBC count 21.8 on admission, likely reactive versus hemoconcentration in the setting of dehydration/DKA as above. -- WBC 21.8>15.0 -- repeat CBC in the am     DVT prophylaxis: enoxaparin (LOVENOX) injection 40 mg Start: 03/07/23 2200    Code Status: Full Code Family Communication: Updated family present at bedside this morning  Disposition Plan:  Level of care: Stepdown Status is: Inpatient Remains inpatient appropriate because: Transitioning from insulin drip this morning, needs to ensure stability on subcutaneous insulin, anticipate discharge home likely tomorrow    Consultants:  None  Procedures:  None  Antimicrobials:  None   Subjective: Patient seen examined bedside, resting calmly.  Lying in bed.  Family present at bedside.  Nausea, abdominal pain now resolved.  Glucose improved and will transition insulin drip to subcutaneous insulin today.  Patient requesting refill of his home insulin.  No other questions or concerns at this time.  Denies headache, no dizziness, no chest pain, no palpitations, no shortness of breath, no abdominal pain, no fever/chills/night sweats, no nausea/vomiting/diarrhea, no focal weakness, no fatigue, no cough/congestion, no paresthesias.  Updated RN, no concerns this morning or overnight.  Objective: Vitals:   03/08/23 0500 03/08/23 0600 03/08/23 0732 03/08/23 0745  BP: 119/69 134/80  Pulse: (!) 123 (!) 123  (!) 118  Resp: 15 13  14   Temp:   98.2 F (36.8 C)   TempSrc:    Oral   SpO2: 96% 97%  97%  Weight:      Height:        Intake/Output Summary (Last 24 hours) at 03/08/2023 1107 Last data filed at 03/08/2023 0745 Gross per 24 hour  Intake 1756.31 ml  Output 1075 ml  Net 681.31 ml   Filed Weights   03/07/23 1549  Weight: 68 kg    Examination:  Physical Exam: GEN: NAD, alert and oriented x 3, thin, chronically ill in appearance, appears older than stated age HEENT: NCAT, PERRL, EOMI, sclera clear, MMM PULM: CTAB w/o wheezes/crackles, normal respiratory effort, on room air CV: RRR w/o M/G/R GI: abd soft, NTND, NABS, no R/G/M MSK: no peripheral edema, muscle strength globally intact 5/5 bilateral upper/lower extremities NEURO: CN II-XII intact, no focal deficits, sensation to light touch intact PSYCH: normal mood/affect Integumentary: dry/intact, no rashes or wounds    Data Reviewed: I have personally reviewed following labs and imaging studies  CBC: Recent Labs  Lab 03/07/23 1605 03/07/23 1621 03/08/23 0016 03/08/23 0253  WBC 21.8*  --  18.8* 15.0*  NEUTROABS 17.6*  --  15.1*  --   HGB 17.7* 17.3* 14.2 13.3  HCT 50.4 51.0 41.9 37.6*  MCV 83.9  --  85.5 84.1  PLT 425*  --  315 313   Basic Metabolic Panel: Recent Labs  Lab 03/07/23 1605 03/07/23 1621 03/07/23 2030 03/08/23 0007 03/08/23 0253 03/08/23 0804  NA 136 132* 139 139 141 141  K 4.3 5.1 4.2 3.6 3.5 3.4*  CL 88*  --  103 105 106 106  CO2 15*  --  20* 23 26 26   GLUCOSE 482*  --  238* 178* 141* 108*  BUN 45*  --  34* 28* 25* 20  CREATININE 2.31*  --  1.46* 1.34* 1.16 1.11  CALCIUM 11.5*  --  9.3 9.0 9.2 9.0  MG 2.5*  --   --   --  1.9 1.9  PHOS  --   --   --   --  1.8* 2.6   GFR: Estimated Creatinine Clearance: 95.3 mL/min (by C-G formula based on SCr of 1.11 mg/dL). Liver Function Tests: Recent Labs  Lab 03/07/23 1605  AST 16  ALT 25  ALKPHOS 86  BILITOT 0.6  PROT 9.5*  ALBUMIN 5.5*   Recent Labs  Lab 03/07/23 1605  LIPASE 17   No results for  input(s): "AMMONIA" in the last 168 hours. Coagulation Profile: No results for input(s): "INR", "PROTIME" in the last 168 hours. Cardiac Enzymes: No results for input(s): "CKTOTAL", "CKMB", "CKMBINDEX", "TROPONINI" in the last 168 hours. BNP (last 3 results) No results for input(s): "PROBNP" in the last 8760 hours. HbA1C: No results for input(s): "HGBA1C" in the last 72 hours. CBG: Recent Labs  Lab 03/08/23 0629 03/08/23 0730 03/08/23 0836 03/08/23 0957 03/08/23 1105  GLUCAP 108* 109* 103* 112* 102*   Lipid Profile: No results for input(s): "CHOL", "HDL", "LDLCALC", "TRIG", "CHOLHDL", "LDLDIRECT" in the last 72 hours. Thyroid Function Tests: No results for input(s): "TSH", "T4TOTAL", "FREET4", "T3FREE", "THYROIDAB" in the last 72 hours. Anemia Panel: No results for input(s): "VITAMINB12", "FOLATE", "FERRITIN", "TIBC", "IRON", "RETICCTPCT" in the last 72 hours. Sepsis Labs: Recent Labs  Lab 03/07/23 1625 03/07/23 1949  LATICACIDVEN 2.3* 1.4    Recent Results (from the past 240 hour(s))  Blood culture (routine x 2)     Status: None (Preliminary result)   Collection Time: 03/07/23  4:33 PM   Specimen: BLOOD RIGHT ARM  Result Value Ref Range Status   Specimen Description   Final    BLOOD RIGHT ARM Performed at [facility], [address] [place], [place] [address]    Special Requests   Final    BOTTLES DRAWN AEROBIC AND ANAEROBIC Blood Culture adequate volume Performed at [facility], [address], [place], [place] [address]    Culture   Final    NO GROWTH < 12 HOURS Performed at [facility], [address] [place], [place] [address]    Report Status PENDING  Incomplete  Blood culture (routine x 2)     Status: None (Preliminary result)   Collection Time: 03/07/23  7:49 PM   Specimen: BLOOD LEFT ARM  Result Value Ref Range Status   Specimen Description   Final    BLOOD LEFT ARM Performed at [facility], [address], [place], [place] [address]    Special Requests   Final    BOTTLES DRAWN AEROBIC AND ANAEROBIC Blood Culture adequate volume Performed at [facility], [address] [place], [place] [address]    Culture   Final    NO GROWTH < 12 HOURS Performed at [facility], [address] [place], [place] [address]    Report Status PENDING  Incomplete  MRSA Next Gen by PCR, Nasal     Status: None   Collection Time: 03/07/23 10:39 PM   Specimen: Nasal Mucosa; Nasal Swab  Result Value Ref Range Status   MRSA by PCR Next Gen NOT DETECTED NOT DETECTED Final    Comment: (NOTE) The GeneXpert MRSA Assay (FDA approved for NASAL specimens only), is one component of a comprehensive MRSA colonization surveillance program. It is not intended to diagnose MRSA infection nor to guide or monitor treatment for MRSA infections. Test performance is not FDA approved in patients less than 2 years old. Performed at [facility], [address] [place], [place] [address]          Radiology Studies: CT ABDOMEN PELVIS WO CONTRAST  Result Date: 03/07/2023 CLINICAL DATA:  Abdominal pain, nausea, vomiting EXAM: CT ABDOMEN AND PELVIS WITHOUT CONTRAST TECHNIQUE: Multidetector CT imaging of the abdomen and pelvis was performed following the standard protocol without IV contrast. RADIATION DOSE REDUCTION: This exam was performed according to the departmental dose-optimization program which includes automated exposure control, adjustment of the mA and/or kV according to patient size and/or use of iterative reconstruction technique. COMPARISON:  01/15/2023 FINDINGS: Lower chest: Visualized lower lung fields are clear. Left hemidiaphragm is elevated. Hepatobiliary: No focal abnormalities are seen in liver. Gallbladder is distended. There is no wall thickening in gallbladder. There is no fluid around the gallbladder. There is no dilation of bile ducts. Pancreas: No  focal abnormalities are seen. Spleen: Unremarkable. Adrenals/Urinary Tract: Adrenals are unremarkable. There is no hydronephrosis. There are no renal or ureteral stones. Urinary bladder is markedly distended. Bladder measures 19 cm in superior-inferior length. Upper margin of the bladder is extending to the level of umbilicus. There is no wall thickening in the bladder. Stomach/Bowel: There is small hiatal hernia. There is marked wall thickening in the lower thoracic esophagus. This may be due to severe reflux esophagitis or some other inflammatory or neoplastic process. There is no significant distention of stomach. Small bowel  loops are unremarkable. Appendix is not seen. There is no pericecal inflammation. There is no significant wall thickening in colon. There is no pericolic stranding. Vascular/Lymphatic: Vascular structures are unremarkable. There are few scattered subcentimeter nodes in the mesentery. Reproductive: Unremarkable. Other: There is no ascites or pneumoperitoneum. Musculoskeletal: No acute findings are seen. IMPRESSION: There is no evidence of intestinal obstruction or pneumoperitoneum. There is no hydronephrosis. There is marked distention of urinary bladder. Possibility of bladder outlet obstruction is not excluded. There is marked wall thickening in the visualized lower thoracic esophagus suggesting possible severe reflux esophagitis. Endoscopic examination as clinically warranted should be considered. Electronically Signed   By: Ernie Avena M.D.   On: 03/07/2023 18:42   DG Chest Port 1 View  Result Date: 03/07/2023 CLINICAL DATA:  Sirs, sepsis EXAM: PORTABLE CHEST 1 VIEW COMPARISON:  01/15/2023 FINDINGS: The heart size and mediastinal contours are within normal limits. Both lungs are clear. The visualized skeletal structures are unremarkable. IMPRESSION: No active disease. Electronically Signed   By: Charlett Nose M.D.   On: 03/07/2023 17:14        Scheduled Meds:   Chlorhexidine Gluconate Cloth  6 each Topical Daily   enoxaparin (LOVENOX) injection  40 mg Subcutaneous Q24H   insulin aspart  0-5 Units Subcutaneous QHS   insulin aspart  0-9 Units Subcutaneous TID WC   insulin aspart  3 Units Subcutaneous TID WC   insulin glargine-yfgn  10 Units Subcutaneous BID   pantoprazole  40 mg Oral BID   potassium chloride  40 mEq Oral Once   Continuous Infusions:  dextrose 5% lactated ringers 125 mL/hr at 03/08/23 0307   insulin 0.5 Units/hr (03/08/23 0630)   lactated ringers Stopped (03/07/23 1949)     LOS: 1 day    Time spent: 53 minutes spent on chart review, discussion with nursing staff, consultants, updating family and interview/physical exam; more than 50% of that time was spent in counseling and/or coordination of care.    Alvira Philips [place], DO [facility] Available via Epic secure chat 7am-7pm After these hours, please refer to coverage provider listed on [email or web address] 03/08/2023, 11:07 AM

## 2023-03-08 NOTE — Inpatient Diabetes Management (Signed)
Inpatient Diabetes Program Recommendations  AACE/ADA: New Consensus Statement on Inpatient Glycemic Control (2015)  Target Ranges:  Prepandial:   less than 140 mg/dL      Peak postprandial:   less than 180 mg/dL (1-2 hours)      Critically ill patients:  140 - 180 mg/dL   Lab Results  Component Value Date   GLUCAP 100 (H) 03/08/2023   HGBA1C >15.5 (H) 01/16/2023    Review of Glycemic Control  Latest Reference Range & Units 03/08/23 08:36 03/08/23 09:57 03/08/23 11:05 03/08/23 12:04  Glucose-Capillary 70 - 99 mg/dL 235 (H) 573 (H) 220 (H) 100 (H)   Diabetes history: Type 1 DM Outpatient Diabetes medications:  Lantus 18 units bid Humalog 5 units tid with meals Current orders for Inpatient glycemic control:  Novolog 0-9 units tid with meals and HS Semglee 10 units bid Novolog 3 units tid with meals  Inpatient Diabetes Program Recommendations:     Note admit with DKA.  Referral received.  Agree with transition insulin doses.  Will see patient tomorrow 03/09/23.  A1C in May was extremely high.   Thanks,  Beryl Meager, RN, BC-ADM Inpatient Diabetes Coordinator Pager [phone]  (8a-5p)

## 2023-03-09 ENCOUNTER — Other Ambulatory Visit: Payer: Self-pay

## 2023-03-09 DIAGNOSIS — E1010 Type 1 diabetes mellitus with ketoacidosis without coma: Secondary | ICD-10-CM | POA: Diagnosis not present

## 2023-03-09 LAB — BASIC METABOLIC PANEL
Anion gap: 8 (ref 5–15)
BUN: 9 mg/dL (ref 6–20)
CO2: 27 mmol/L (ref 22–32)
Calcium: 8.6 mg/dL — ABNORMAL LOW (ref 8.9–10.3)
Chloride: 100 mmol/L (ref 98–111)
Creatinine, Ser: 0.73 mg/dL (ref 0.61–1.24)
GFR, Estimated: >60 mL/min (ref >60)
Glucose, Bld: 159 mg/dL — ABNORMAL HIGH (ref 70–99)
Potassium: 3.3 mmol/L — ABNORMAL LOW (ref 3.5–5.1)
Sodium: 135 mmol/L (ref 135–145)

## 2023-03-09 LAB — CBC
HCT: 33.1 % — ABNORMAL LOW (ref 39.0–52.0)
Hemoglobin: 11.3 g/dL — ABNORMAL LOW (ref 13.0–17.0)
MCH: 29.6 pg (ref 26.0–34.0)
MCHC: 34.1 g/dL (ref 30.0–36.0)
MCV: 86.6 fL (ref 80.0–100.0)
Platelets: 226 10*3/uL (ref 150–400)
RBC: 3.82 MIL/uL — ABNORMAL LOW (ref 4.22–5.81)
RDW: 13 % (ref 11.5–15.5)
WBC: 10.4 10*3/uL (ref 4.0–10.5)
nRBC: 0 % (ref 0.0–0.2)

## 2023-03-09 LAB — URINALYSIS, ROUTINE W REFLEX MICROSCOPIC
Bacteria, UA: NONE SEEN
Bilirubin Urine: NEGATIVE
Glucose, UA: >=500 mg/dL — AB
Hgb urine dipstick: NEGATIVE
Ketones, ur: 20 mg/dL — AB
Leukocytes,Ua: NEGATIVE
Nitrite: NEGATIVE
Protein, ur: 100 mg/dL — AB
Specific Gravity, Urine: 1.016 (ref 1.005–1.030)
pH: 6 (ref 5.0–8.0)

## 2023-03-09 LAB — GLUCOSE, CAPILLARY: Glucose-Capillary: 165 mg/dL — ABNORMAL HIGH (ref 70–99)

## 2023-03-09 LAB — MAGNESIUM: Magnesium: 1.5 mg/dL — ABNORMAL LOW (ref 1.7–2.4)

## 2023-03-09 MED ORDER — GLUCOSE BLOOD VI STRP
ORAL_STRIP | 0 refills | Status: DC
  Filled 2023-03-09: qty 100, 25d supply, fill #0

## 2023-03-09 MED ORDER — MAGNESIUM SULFATE 4 GM/100ML IV SOLN
4.0000 g | Freq: Once | INTRAVENOUS | Status: AC
  Administered 2023-03-09: 4 g via INTRAVENOUS
  Filled 2023-03-09: qty 100

## 2023-03-09 MED ORDER — INSULIN PEN NEEDLE 31G X 5 MM MISC
11 refills | Status: DC
Start: 2023-03-09 — End: 2023-09-04
  Filled 2023-03-09: qty 100, 34d supply, fill #0
  Filled 2023-04-10 (×2): qty 100, 34d supply, fill #1
  Filled 2023-05-04 – 2023-05-06 (×2): qty 100, 34d supply, fill #2
  Filled 2023-06-02 – 2023-06-24 (×2): qty 100, 34d supply, fill #3
  Filled 2023-07-24: qty 100, 50d supply, fill #4
  Filled 2023-08-27 – 2023-09-01 (×2): qty 100, 50d supply, fill #5

## 2023-03-09 MED ORDER — POTASSIUM CHLORIDE CRYS ER 20 MEQ PO TBCR
30.0000 meq | EXTENDED_RELEASE_TABLET | ORAL | Status: DC
  Administered 2023-03-09: 30 meq via ORAL
  Filled 2023-03-09: qty 1

## 2023-03-09 MED ORDER — ACCU-CHEK GUIDE VI STRP
ORAL_STRIP | 0 refills | Status: DC
  Filled 2023-03-09: qty 100, 25d supply, fill #0

## 2023-03-09 MED ORDER — PANTOPRAZOLE SODIUM 40 MG PO TBEC
40.0000 mg | DELAYED_RELEASE_TABLET | Freq: Two times a day (BID) | ORAL | 2 refills | Status: DC
  Filled 2023-03-09: qty 60, 30d supply, fill #0

## 2023-03-09 MED ORDER — INSULIN GLARGINE 100 UNIT/ML SOLOSTAR PEN
18.0000 [IU] | PEN_INJECTOR | Freq: Two times a day (BID) | SUBCUTANEOUS | 2 refills | Status: DC
Start: 2023-03-09 — End: 2023-08-21
  Filled 2023-03-09: qty 15, 42d supply, fill #0
  Filled 2023-04-01 – 2023-04-10 (×3): qty 15, 42d supply, fill #1
  Filled 2023-05-25: qty 15, 42d supply, fill #2

## 2023-03-09 MED ORDER — ACCU-CHEK SOFTCLIX LANCETS MISC
0 refills | Status: DC
  Filled 2023-03-09: qty 100, 25d supply, fill #0

## 2023-03-09 MED ORDER — INSULIN LISPRO 100 UNIT/ML IJ SOLN
5.0000 [IU] | Freq: Three times a day (TID) | INTRAMUSCULAR | 3 refills | Status: DC
  Filled 2023-03-09: qty 10, 28d supply, fill #0
  Filled 2023-04-01 – 2023-04-10 (×3): qty 10, 28d supply, fill #1

## 2023-03-09 MED ORDER — "INSULIN SYRINGE-NEEDLE U-100 31G X 15/64"" 0.3 ML MISC"
11 refills | Status: DC
  Filled 2023-03-09: qty 100, 33d supply, fill #0
  Filled 2023-04-10: qty 100, 33d supply, fill #1

## 2023-03-09 MED ORDER — BLOOD GLUCOSE METER KIT
PACK | 0 refills | Status: DC
  Filled 2023-03-09: qty 1, 30d supply, fill #0

## 2023-03-09 NOTE — Discharge Summary (Signed)
Physician Discharge Summary  [name]:[id number] DOB: 1995-12-05 DOA: 03/07/2023  PCP: Claiborne Rigg, NP  Admit date: 03/07/2023 Discharge date: 03/09/2023  Admitted From: Home Disposition: Home  Recommendations for Outpatient Follow-up:  Follow up with PCP in 1-2 weeks Filled home Lantus 18 units subcutaneously twice daily, NovoLog 5 units 3 times daily AC Patient inquiring into Dexcom device, will defer to PCP Recommend outpatient referral to GI for esophageal wall thickening noted on CT abdomen/pelvis for consideration of EGD  Home Health: No Equipment/Devices: None  Discharge Condition: Stable  CODE STATUS: Full code Diet recommendation: Consistent carbohydrate diet  History of present illness:  Joseph Barr is a 27 y.o. male with past medical history significant for type 1 diabetes mellitus that is poorly controlled with multiple hospitalizations who presented to [facility] on 7/6 with nausea, vomiting and abdominal pain.  Patient reports he has been taking his insulin.  Denies fevers, no chills.  Denies any recent sick contacts.  Unclear etiology to his recurrent symptoms; but highly suspect medication noncompliance.   In the ED, temperature 97.2 F, HR 120, RR 22, BP 76/45, SpO2 100% on room air.  VBG with pH 7.262.  Sodium 136, potassium 4.3, chloride 88, CO2 15, glucose 482, BUN 45, creatinine 2.31.  Lipase 17.  AST 16, ALT 25, total bilirubin 0.6.  Lactic acid 2.3.  WBC 21.8, hemoglobin 17.7, platelet count 425.  MRSA PCR negative.  Beta hydroxybutyrate acid greater than 8.00.  Chest x-ray with no active cardiopulmonary disease process.  CT abdomen/pelvis with no evidence of intestinal obstruction/pneumoperitoneum, no hydronephrosis, distention of urinary bladder, thickening lower thoracic esophagus.  Hospital course:  Diabetic ketoacidosis Type 1 diabetes mellitus Patient presenting to ED with nausea, vomiting, abdominal pain.  Glucose 42, anion gap  33, beta hydroxybutyrate acid greater than 8.  Patient reports compliance with his home insulin regimen, although multiple hospitalizations and elevated hemoglobin A1c would favor otherwise.  Outpatient regimen includes Lantus 18 units subcutaneously twice daily, Humalog 5 units 3 times daily before meals and sliding scale coverage.  Most recent hemoglobin A1c greater than 15.5 on 01/16/2023.  Received IV fluid bolus and started on insulin drip.  Patient's glucose improved with closure of anion gap and patient was transition back to subcutaneous insulin.  Refilled home Lantus and Humalog.  Patient inquiring into Dexcom device, will defer to PCP visit.  Recommend outpatient follow-up PCP 1 week.   Acute renal failure: Resolved Creatinine elevated 2.31 on admission, likely secondary to prerenal azotemia in the setting of severe dehydration secondary to DKA as above.  Creatinine improved to 0.73 at time of discharge.   Esophageal wall thickening Incidental finding of thickening of the lower thoracic esophagus on CT abdomen/pelvis.  Denies dysphagia symptoms. Recommend outpatient follow-up with gastroenterology for consideration of EGD   Leukocytosis WBC count 21.8 on admission, likely reactive versus hemoconcentration in the setting of dehydration/DKA as above.  WBC improved to 10.4 at time of discharge.   Discharge Diagnoses:  Principal Problem:   Diabetic ketoacidosis associated with type 1 diabetes mellitus [facility])    Discharge Instructions  Discharge Instructions     Call MD for:  difficulty breathing, headache or visual disturbances   Complete by: As directed    Call MD for:  extreme fatigue   Complete by: As directed    Call MD for:  persistant dizziness or light-headedness   Complete by: As directed    Call MD for:  persistant nausea and vomiting  Complete by: As directed    Call MD for:  severe uncontrolled pain   Complete by: As directed    Call MD for:  temperature >100.4    Complete by: As directed    Diet - low sodium heart healthy   Complete by: As directed    Increase activity slowly   Complete by: As directed       Allergies as of 03/09/2023   No Known Allergies      Medication List     STOP taking these medications    magnesium oxide 400 (240 Mg) MG tablet Commonly known as: MAG-OX       TAKE these medications    acetaminophen 500 MG tablet Commonly known as: TYLENOL Take 500 mg by mouth every 6 (six) hours as needed for headache.   blood glucose meter kit and supplies Use up to four times daily as directed. (FOR ICD-10 E10.9, E11.9).   insulin glargine 100 UNIT/ML Solostar Pen Commonly known as: LANTUS Inject 18 Units into the skin 2 (two) times daily.   insulin lispro 100 UNIT/ML injection Commonly known as: HUMALOG Inject 0.05 mLs (5 Units total) into the skin 3 (three) times daily with meals.   Insulin Pen Needle 31G X 5 MM Misc Use to inject Lantus twice daily.   Insulin Syringe-Needle U-100 31G X 15/64" 0.3 ML Misc Use to inject Humalog 3x daily. Keep upcoming appt for additional refills.   ondansetron 4 MG tablet Commonly known as: ZOFRAN Take 1 tablet (4 mg total) by mouth every 6 (six) hours as needed for nausea.   pantoprazole 40 MG tablet Commonly known as: PROTONIX Take 1 tablet (40 mg total) by mouth 2 (two) times daily.   True Metrix Blood Glucose Test test strip Generic drug: glucose blood Use as instructed   True Metrix Blood Glucose Test test strip Generic drug: glucose blood use as directed to test blood sugars four times a day   True Metrix Meter w/Device Kit Use as directed   TRUEplus Lancets 28G Misc use as directed to test blood sugars four times a day        Follow-up Information     Claiborne Rigg, NP. Schedule an appointment as soon as possible for a visit in 1 week(s).   Specialty: Nurse Practitioner Contact information: [address] [place]  [address] [phone]                No Known Allergies  Consultations: None   Procedures/Studies: CT ABDOMEN PELVIS WO CONTRAST  Result Date: 03/07/2023 CLINICAL DATA:  Abdominal pain, nausea, vomiting EXAM: CT ABDOMEN AND PELVIS WITHOUT CONTRAST TECHNIQUE: Multidetector CT imaging of the abdomen and pelvis was performed following the standard protocol without IV contrast. RADIATION DOSE REDUCTION: This exam was performed according to the departmental dose-optimization program which includes automated exposure control, adjustment of the mA and/or kV according to patient size and/or use of iterative reconstruction technique. COMPARISON:  01/15/2023 FINDINGS: Lower chest: Visualized lower lung fields are clear. Left hemidiaphragm is elevated. Hepatobiliary: No focal abnormalities are seen in liver. Gallbladder is distended. There is no wall thickening in gallbladder. There is no fluid around the gallbladder. There is no dilation of bile ducts. Pancreas: No focal abnormalities are seen. Spleen: Unremarkable. Adrenals/Urinary Tract: Adrenals are unremarkable. There is no hydronephrosis. There are no renal or ureteral stones. Urinary bladder is markedly distended. Bladder measures 19 cm in superior-inferior length. Upper margin of the bladder is extending to the  level of umbilicus. There is no wall thickening in the bladder. Stomach/Bowel: There is small hiatal hernia. There is marked wall thickening in the lower thoracic esophagus. This may be due to severe reflux esophagitis or some other inflammatory or neoplastic process. There is no significant distention of stomach. Small bowel loops are unremarkable. Appendix is not seen. There is no pericecal inflammation. There is no significant wall thickening in colon. There is no pericolic stranding. Vascular/Lymphatic: Vascular structures are unremarkable. There are few scattered subcentimeter nodes in the mesentery. Reproductive: Unremarkable. Other: There  is no ascites or pneumoperitoneum. Musculoskeletal: No acute findings are seen. IMPRESSION: There is no evidence of intestinal obstruction or pneumoperitoneum. There is no hydronephrosis. There is marked distention of urinary bladder. Possibility of bladder outlet obstruction is not excluded. There is marked wall thickening in the visualized lower thoracic esophagus suggesting possible severe reflux esophagitis. Endoscopic examination as clinically warranted should be considered. Electronically Signed   By: Ernie Avena M.D.   On: 03/07/2023 18:42   DG Chest Port 1 View  Result Date: 03/07/2023 CLINICAL DATA:  Sirs, sepsis EXAM: PORTABLE CHEST 1 VIEW COMPARISON:  01/15/2023 FINDINGS: The heart size and mediastinal contours are within normal limits. Both lungs are clear. The visualized skeletal structures are unremarkable. IMPRESSION: No active disease. Electronically Signed   By: Charlett Nose M.D.   On: 03/07/2023 17:14     Subjective: Patient seen examined bedside, resting calmly.  Lying in bed.  Family present.  Overall feels much improved and back to his typical baseline.  Patient requesting refill of his home insulin and a new glucometer.  No other questions or concerns at this time.  Denies headache, no dizziness, no chest pain, no palpitations, no shortness of breath, no abdominal pain, no fever/chills/night sweats, no nausea/vomiting/diarrhea, no focal weakness, no fatigue, no paresthesias.  No acute events overnight per nursing staff.  Discharge Exam: Vitals:   03/09/23 0107 03/09/23 0519  BP: 128/84 130/77  Pulse: 93 89  Resp: 18 17  Temp: 99.2 F (37.3 C) 98.9 F (37.2 C)  SpO2: 99% 98%   Vitals:   03/08/23 1833 03/08/23 2153 03/09/23 0107 03/09/23 0519  BP: 133/88 122/74 128/84 130/77  Pulse: 100 97 93 89  Resp: 17 18 18 17   Temp: 98.4 F (36.9 C) 98.3 F (36.8 C) 99.2 F (37.3 C) 98.9 F (37.2 C)  TempSrc: Oral  Oral Oral  SpO2: 97% 99% 99% 98%  Weight:       Height:        Physical Exam: GEN: NAD, alert and oriented x 3, thin/chronically ill in appearance, appears older than stated age HEENT: NCAT, PERRL, EOMI, sclera clear, MMM PULM: CTAB w/o wheezes/crackles, normal respiratory effort, on room air CV: RRR w/o M/G/R GI: abd soft, NTND, NABS, no R/G/M MSK: no peripheral edema, muscle strength globally intact 5/5 bilateral upper/lower extremities NEURO: CN II-XII intact, no focal deficits, sensation to light touch intact PSYCH: normal mood/affect Integumentary: dry/intact, no rashes or wounds    The results of significant diagnostics from this hospitalization (including imaging, microbiology, ancillary and laboratory) are listed below for reference.     Microbiology: Recent Results (from the past 240 hour(s))  Blood culture (routine x 2)     Status: None (Preliminary result)   Collection Time: 03/07/23  4:33 PM   Specimen: BLOOD RIGHT ARM  Result Value Ref Range Status   Specimen Description   Final    BLOOD RIGHT ARM Performed at [facility], [address], [place], [place] [address]    Special Requests   Final    BOTTLES DRAWN AEROBIC AND ANAEROBIC Blood Culture adequate volume Performed at [facility], [address] [place], [place] [address]    Culture   Final    NO GROWTH 2 DAYS Performed at [facility], [address] [place], [place] [address]    Report Status PENDING  Incomplete  Blood culture (routine x 2)     Status: None (Preliminary result)   Collection Time: 03/07/23  7:49 PM   Specimen: BLOOD LEFT ARM  Result Value Ref Range Status   Specimen Description   Final    BLOOD LEFT ARM Performed at [facility], [address] [place], [place] [address]    Special Requests   Final    BOTTLES DRAWN AEROBIC AND ANAEROBIC Blood Culture adequate volume Performed at [facility], [address], [place], [place] [address]    Culture    Final    NO GROWTH 2 DAYS Performed at [facility], [address], [place], [place] [address]    Report Status PENDING  Incomplete  MRSA Next Gen by PCR, Nasal     Status: None   Collection Time: 03/07/23 10:39 PM   Specimen: Nasal Mucosa; Nasal Swab  Result Value Ref Range Status   MRSA by PCR Next Gen NOT DETECTED NOT DETECTED Final    Comment: (NOTE) The GeneXpert MRSA Assay (FDA approved for NASAL specimens only), is one component of a comprehensive MRSA colonization surveillance program. It is not intended to diagnose MRSA infection nor to guide or monitor treatment for MRSA infections. Test performance is not FDA approved in patients less than 57 years old. Performed at [facility], [address] [place], [place] [address]      Labs: BNP (last 3 results) No results for input(s): "BNP" in the last 8760 hours. Basic Metabolic Panel: Recent Labs  Lab 03/07/23 1605 03/07/23 1621 03/07/23 2030 03/08/23 0007 03/08/23 0253 03/08/23 0804 03/09/23 0313  NA 136   < > 139 139 141 141 135  K 4.3   < > 4.2 3.6 3.5 3.4* 3.3*  CL 88*  --  103 105 106 106 100  CO2 15*  --  20* 23 26 26 27   GLUCOSE 482*  --  238* 178* 141* 108* 159*  BUN 45*  --  34* 28* 25* 20 9  CREATININE 2.31*  --  1.46* 1.34* 1.16 1.11 0.73  CALCIUM 11.5*  --  9.3 9.0 9.2 9.0 8.6*  MG 2.5*  --   --   --  1.9 1.9 1.5*  PHOS  --   --   --   --  1.8* 2.6  --    < > = values in this interval not displayed.   Liver Function Tests: Recent Labs  Lab 03/07/23 1605  AST 16  ALT 25  ALKPHOS 86  BILITOT 0.6  PROT 9.5*  ALBUMIN 5.5*   Recent Labs  Lab 03/07/23 1605  LIPASE 17   No results for input(s): "AMMONIA" in the last 168 hours. CBC: Recent Labs  Lab 03/07/23 1605 03/07/23 1621 03/08/23 0016 03/08/23 0253 03/09/23 0313  WBC 21.8*  --  18.8* 15.0* 10.4  NEUTROABS 17.6*  --  15.1*  --   --   HGB 17.7* 17.3* 14.2 13.3 11.3*  HCT 50.4 51.0 41.9 37.6* 33.1*  MCV  83.9  --  85.5 84.1 86.6  PLT 425*  --  315 313 226   Cardiac Enzymes: No results for input(s): "CKTOTAL", "CKMB", "CKMBINDEX", "TROPONINI" in the last 168 hours. BNP: Invalid input(s): "POCBNP" CBG: Recent Labs  Lab 03/08/23 1204 03/08/23 1316 03/08/23 1724 03/08/23 2159 03/09/23 0745  GLUCAP 100* 110* 72 168* 165*   D-Dimer No results for input(s): "DDIMER" in the last 72 hours. Hgb A1c No results for input(s): "HGBA1C" in the last 72 hours. Lipid Profile No results for input(s): "CHOL", "HDL", "LDLCALC", "TRIG", "CHOLHDL", "LDLDIRECT" in the last 72 hours. Thyroid function studies No results for input(s): "TSH", "T4TOTAL", "T3FREE", "THYROIDAB" in the last 72 hours.  Invalid input(s): "FREET3" Anemia work up No results for input(s): "VITAMINB12", "FOLATE", "FERRITIN", "TIBC", "IRON", "RETICCTPCT" in the last 72 hours. Urinalysis    Component Value Date/Time   COLORURINE YELLOW 03/08/2023 0006   APPEARANCEUR TURBID (A) 03/08/2023 0006   LABSPEC 1.016 03/08/2023 0006   PHURINE 6.0 03/08/2023 0006   GLUCOSEU >=500 (A) 03/08/2023 0006   HGBUR NEGATIVE 03/08/2023 0006   BILIRUBINUR NEGATIVE 03/08/2023 0006   BILIRUBINUR Negative 08/01/2016 0950   KETONESUR 20 (A) 03/08/2023 0006   PROTEINUR 100 (A) 03/08/2023 0006   UROBILINOGEN 0.2 08/01/2016 0950   UROBILINOGEN 0.2 04/24/2015 1305   NITRITE NEGATIVE 03/08/2023 0006   LEUKOCYTESUR NEGATIVE 03/08/2023 0006   Sepsis Labs Recent Labs  Lab 03/07/23 1605 03/08/23 0016 03/08/23 0253 03/09/23 0313  WBC 21.8* 18.8* 15.0* 10.4   Microbiology Recent Results (from the past 240 hour(s))  Blood culture (routine x 2)     Status: None (Preliminary result)   Collection Time: 03/07/23  4:33 PM   Specimen: BLOOD RIGHT ARM  Result Value Ref Range Status   Specimen Description   Final    BLOOD RIGHT ARM Performed at [facility], [address], [place], [place] [address]    Special Requests   Final     BOTTLES DRAWN AEROBIC AND ANAEROBIC Blood Culture adequate volume Performed at [facility], [address] [place], [place] [address]    Culture   Final    NO GROWTH 2 DAYS Performed at [facility], [address], [place], [place] [address]    Report Status PENDING  Incomplete  Blood culture (routine x 2)     Status: None (Preliminary result)   Collection Time: 03/07/23  7:49 PM   Specimen: BLOOD LEFT ARM  Result Value Ref Range Status   Specimen Description   Final    BLOOD LEFT ARM Performed at [facility], [address] [place], [place] [address]    Special Requests   Final    BOTTLES DRAWN AEROBIC AND ANAEROBIC Blood Culture adequate volume Performed at [facility], [address], [place], [place] [address]    Culture   Final    NO GROWTH 2 DAYS Performed at [facility], [address] [place], [place] [address]    Report Status PENDING  Incomplete  MRSA Next Gen by PCR, Nasal     Status: None   Collection Time: 03/07/23 10:39 PM   Specimen: Nasal Mucosa; Nasal Swab  Result Value Ref Range Status   MRSA by PCR Next Gen NOT DETECTED NOT DETECTED Final    Comment: (NOTE) The GeneXpert MRSA Assay (FDA approved for NASAL specimens only), is one component of a comprehensive MRSA colonization surveillance program. It is not intended to diagnose MRSA infection nor to guide or monitor  treatment for MRSA infections. Test performance is not FDA approved in patients less than 19 years old. Performed at [facility], [address], [place], [place] [address]      Time coordinating discharge: Over 30 minutes  SIGNED:   Alvira Philips [place], DO  [facility] 03/09/2023, 10:32 AM

## 2023-03-09 NOTE — TOC CM/SW Note (Signed)
Transition of Care [facility]) - Inpatient Brief Assessment   Patient Details  Name: Joseph Barr MRN: [id number] Date of Birth: March 25, 1996  [facility] CM/SW Contact:    Amada Jupiter, LCSW Phone Number: 03/09/2023, 9:28 AM   Clinical Narrative:  Met with pt and confirming he is still being followed at [facility] for PCP.  Address and phone #s accurate still.  Encouraged pt to set up appt with PCP as hospital follow up and to connect with [facility] case manager.  Pt receptive.  Anticipate dc today.  Transition of Care Asessment: Insurance and Status: Insurance coverage has been reviewed Patient has primary care physician: Yes Home environment has been reviewed: home - yes Prior level of function:: independent Prior/Current Home Services: No current home services Social Determinants of Health Reivew: SDOH reviewed no interventions necessary Readmission risk has been reviewed: Yes Transition of care needs: no transition of care needs at this time

## 2023-03-09 NOTE — Plan of Care (Signed)
  Problem: Education: Goal: Knowledge of General Education information will improve Description: Including pain rating scale, medication(s)/side effects and non-pharmacologic comfort measures Outcome: Progressing   Problem: Activity: Goal: Risk for activity intolerance will decrease Outcome: Progressing   Problem: Nutrition: Goal: Adequate nutrition will be maintained Outcome: Progressing   Problem: Coping: Goal: Level of anxiety will decrease Outcome: Progressing   

## 2023-03-10 ENCOUNTER — Telehealth: Payer: Self-pay

## 2023-03-10 LAB — HEMOGLOBIN A1C
Hgb A1c MFr Bld: >15.5 % — ABNORMAL HIGH (ref 4.8–5.6)
Mean Plasma Glucose: >398 mg/dL

## 2023-03-10 LAB — CULTURE, BLOOD (ROUTINE X 2)
Culture: NO GROWTH
Special Requests: ADEQUATE

## 2023-03-10 NOTE — Transitions of Care (Post Inpatient/ED Visit) (Signed)
   03/10/2023  Name: Joseph Barr MRN: [id number] DOB: February 05, 1996  Today's TOC FU Call Status: Today's TOC FU Call Status:: Unsuccessul Call (1st Attempt) Unsuccessful Call (1st Attempt) Date: 03/02/23  Attempted to reach the patient regarding the most recent Inpatient/ED visit.  Follow Up Plan: Additional outreach attempts will be made to reach the patient to complete the Transitions of Care (Post Inpatient/ED visit) call.   Signature  Robyne Peers, RN

## 2023-03-11 ENCOUNTER — Telehealth: Payer: Self-pay

## 2023-03-11 LAB — CULTURE, BLOOD (ROUTINE X 2)

## 2023-03-11 NOTE — Telephone Encounter (Signed)
Joseph Barr-  just an FYI.   He has an appointment with you next week, 03/17/2023 and he wants to discuss obtaining a CGM.   He also reports that he sweats profusely when he eats, no matter what he eats and he wants to talk about this too.

## 2023-03-11 NOTE — Transitions of Care (Post Inpatient/ED Visit) (Signed)
03/11/2023  Name: Joseph Barr MRN: [id number] DOB: [birth year]  Today's TOC FU Call Status: Today's TOC FU Call Status:: Successful TOC FU Call Competed Unsuccessful Call (1st Attempt) Date: 03/10/23 [facility] FU Call Complete Date: 03/11/23  Transition Care Management Follow-up Telephone Call Date of Discharge: 03/09/23 Discharge Facility: [facility]) Type of Discharge: Inpatient Admission Primary Inpatient Discharge Diagnosis:: DKA How have you been since you were released from the hospital?: Better Any questions or concerns?: Yes Patient Questions/Concerns:: he would like a CGM.  he also said that he sweats profusely when he eats anything, it does not matter what he eats.  He said he will discuss this with provider at upcoming appt. Patient Questions/Concerns Addressed: Other: (this request is documented in the appointment notes)  Items Reviewed: Did you receive and understand the discharge instructions provided?: Yes Medications obtained,verified, and reconciled?: No Medications Not Reviewed Reasons:: Other: (He said he has all medications as well as a glucometer and he did not have any questions about the med regime and he did not need to review the med list.) Any new allergies since your discharge?: No Dietary orders reviewed?: Yes Type of Diet Ordered:: heart healthy diabetic.  He stated that he counts his carbs Do you have support at home?: Yes People in Home: parent(s) Name of Support/Comfort Primary Source: he lives with his mother  Medications Reviewed Today: Medications Reviewed Today     Reviewed by Thomasene Mohair CPhT (Pharmacy Technician) on 03/07/23 at 2139  Med List Status: Complete   Medication Order Taking? Sig Documenting Provider Last Dose Status Informant  acetaminophen (TYLENOL) 500 MG tablet [id number] Yes Take 500 mg by mouth every 6 (six) hours as needed for headache. [provider] Past Week Active Self  Blood Glucose Monitoring Suppl  (TRUE METRIX METER) w/Device KIT [id number]  Use as directed Lonia Blood, MD  Active Self  glucose blood (TRUE METRIX BLOOD GLUCOSE TEST) test strip [id number]  Use as instructed Rema Fendt, NP  Active Self  glucose blood test strip [id number]  use as directed to test blood sugars four times a day Lonia Blood, MD  Active Self  insulin glargine (LANTUS) 100 UNIT/ML Solostar Pen [id number] Yes Inject 18 Units into the skin 2 (two) times daily. Lonia Blood, MD 03/07/2023 Active Self  insulin lispro (HUMALOG) 100 UNIT/ML injection [id number] Yes Inject 0.05 mLs (5 Units total) into the skin 3 (three) times daily with meals. Lonia Blood, MD 03/07/2023 Active Self  Insulin Pen Needle 31G X 5 MM MISC [id number]  Use to inject Lantus twice daily. Lonia Blood, MD  Active Self  Insulin Syringe-Needle U-100 31G X 15/64" 0.3 ML MISC [id number]  Use to inject Humalog 3x daily. Keep upcoming appt for additional refills. Lonia Blood, MD  Active Self  magnesium oxide (MAG-OX) 400 (240 Mg) MG tablet [id number] No Take 1 tablet (400 mg total) by mouth 2 (two) times daily.  Patient not taking: Reported on 03/07/2023   Cathren Harsh, MD Not Taking Active   ondansetron (ZOFRAN) 4 MG tablet [id number] Yes Take 1 tablet (4 mg total) by mouth every 6 (six) hours as needed for nausea. Cathren Harsh, MD unknown Active   TRUEplus Lancets 28G MISC [id number]  use as directed to test blood sugars four times a day Lonia Blood, MD  Active Self            Home Care and Equipment/Supplies: Were Home Health Services  Ordered?: No Any new equipment or medical supplies ordered?: Yes Name of Medical supply agency?: he received a new glucometer when he left the hospital Were you able to get the equipment/medical supplies?: Yes Do you have any questions related to the use of the equipment/supplies?: No  Functional Questionnaire: Do you need assistance with bathing/showering or dressing?:  No Do you need assistance with meal preparation?: No Do you need assistance with eating?: No Do you have difficulty maintaining continence: No Do you need assistance with getting out of bed/getting out of a chair/moving?: No Do you have difficulty managing or taking your medications?: No  Follow up appointments reviewed: PCP Follow-up appointment confirmed?: Yes Date of PCP follow-up appointment?: 03/17/23 Follow-up Provider: Ricky Stabs, NP- I provided him with the clinic address and phone number [facility] Follow-up appointment confirmed?: NA Do you need transportation to your follow-up appointment?: No Do you understand care options if your condition(s) worsen?: Yes-patient verbalized understanding    SIGNATURE  Robyne Peers, RN

## 2023-03-11 NOTE — Telephone Encounter (Signed)
Noted  

## 2023-03-12 LAB — CULTURE, BLOOD (ROUTINE X 2): Culture: NO GROWTH

## 2023-03-17 ENCOUNTER — Ambulatory Visit: Payer: Medicaid Other | Admitting: Family

## 2023-03-17 ENCOUNTER — Encounter: Payer: Self-pay | Admitting: Family

## 2023-03-17 ENCOUNTER — Other Ambulatory Visit: Payer: Self-pay

## 2023-03-17 VITALS — BP 118/78 | HR 105 | Temp 97.3°F | Ht 72.0 in | Wt 151.6 lb

## 2023-03-17 DIAGNOSIS — D72829 Elevated white blood cell count, unspecified: Secondary | ICD-10-CM

## 2023-03-17 DIAGNOSIS — N179 Acute kidney failure, unspecified: Secondary | ICD-10-CM | POA: Diagnosis not present

## 2023-03-17 DIAGNOSIS — E1010 Type 1 diabetes mellitus with ketoacidosis without coma: Secondary | ICD-10-CM | POA: Diagnosis not present

## 2023-03-17 DIAGNOSIS — E1065 Type 1 diabetes mellitus with hyperglycemia: Secondary | ICD-10-CM

## 2023-03-17 DIAGNOSIS — K2289 Other specified disease of esophagus: Secondary | ICD-10-CM

## 2023-03-17 DIAGNOSIS — Z794 Long term (current) use of insulin: Secondary | ICD-10-CM

## 2023-03-17 DIAGNOSIS — Z09 Encounter for follow-up examination after completed treatment for conditions other than malignant neoplasm: Secondary | ICD-10-CM

## 2023-03-17 MED ORDER — DEXCOM G6 TRANSMITTER MISC
1.0000 | Freq: Three times a day (TID) | 3 refills | Status: DC
Start: 2023-03-17 — End: 2023-04-10
  Filled 2023-03-17: qty 1, 90d supply, fill #0

## 2023-03-17 MED ORDER — DEXCOM G6 SENSOR MISC
1.0000 | Freq: Three times a day (TID) | 2 refills | Status: DC
Start: 2023-03-17 — End: 2023-04-10
  Filled 2023-03-17: qty 3, 30d supply, fill #0

## 2023-03-17 MED ORDER — DEXCOM G6 RECEIVER DEVI
1.0000 | Freq: Three times a day (TID) | 2 refills | Status: DC
Start: 2023-03-17 — End: 2023-04-10
  Filled 2023-03-17: qty 1, 90d supply, fill #0

## 2023-03-17 NOTE — Progress Notes (Signed)
TRANSITION OF CARE VISIT   Date of Admission: 03/07/2023   Date of Discharge: 03/09/2023  Transitions of Care Call: 03/11/2023  Discharged from: [facility]   Discharge Diagnosis:  Principal Problem:   Diabetic ketoacidosis associated with type 1 diabetes mellitus (HCC)   Summary of Admission per DO note: Recommendations for Outpatient Follow-up:  Follow up with PCP in 1-2 weeks Filled home Lantus 18 units subcutaneously twice daily, NovoLog 5 units 3 times daily AC Patient inquiring into Dexcom device, will defer to PCP Recommend outpatient referral to GI for esophageal wall thickening noted on CT abdomen/pelvis for consideration of EGD   Hospital course:   Diabetic ketoacidosis Type 1 diabetes mellitus Patient presenting to ED with nausea, vomiting, abdominal pain.  Glucose 42, anion gap 33, beta hydroxybutyrate acid greater than 8.  Patient reports compliance with his home insulin regimen, although multiple hospitalizations and elevated hemoglobin A1c would favor otherwise.  Outpatient regimen includes Lantus 18 units subcutaneously twice daily, Humalog 5 units 3 times daily before meals and sliding scale coverage.  Most recent hemoglobin A1c greater than 15.5 on 01/16/2023.  Received IV fluid bolus and started on insulin drip.  Patient's glucose improved with closure of anion gap and patient was transition back to subcutaneous insulin.  Refilled home Lantus and Humalog.  Patient inquiring into Dexcom device, will defer to PCP visit.  Recommend outpatient follow-up PCP 1 week.   Acute renal failure: Resolved Creatinine elevated 2.31 on admission, likely secondary to prerenal azotemia in the setting of severe dehydration secondary to DKA as above.  Creatinine improved to 0.73 at time of discharge.   Esophageal wall thickening Incidental finding of thickening of the lower thoracic esophagus on CT abdomen/pelvis.  Denies dysphagia  symptoms. Recommend outpatient follow-up with gastroenterology for consideration of EGD   Leukocytosis WBC count 21.8 on admission, likely reactive versus hemoconcentration in the setting of dehydration/DKA as above.  WBC improved to 10.4 at time of discharge.    Today's visit 03/17/2023: Feeling improved since hospital discharge. Taking Lantus as prescribed. 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 Home blood sugars varies from 90's to 300's. He is trying to monitor what he eats. He exercises outside of his normal routine. He denies red flag symptoms associated with diabetes. Requests Dexcom monitoring system for blood sugars. Denies symptoms associated with esophageal wall thickening. No further issues/concerns for discussion today.    Patient/Caregiver self-reported problems/concerns: see above  MEDICATIONS  Medication Reconciliation conducted with patient/caregiver? (Yes/ No): Yes   New medications prescribed/discontinued upon discharge? (Yes/No): Yes  Barriers identified related to medications: No   LABS  Lab Reviewed (Yes/No/NA): Yes  PHYSICAL EXAM:  Physical Exam HENT:     Head: Normocephalic and atraumatic.     Nose: Nose normal.     Mouth/Throat:     Mouth: Mucous membranes are moist.     Pharynx: Oropharynx is clear.  Eyes:     Extraocular Movements: Extraocular movements intact.     Conjunctiva/sclera: Conjunctivae normal.     Pupils: Pupils are equal, round, and reactive to light.  Cardiovascular:     Rate and Rhythm: Tachycardia present.     Pulses: Normal pulses.     Heart sounds: Normal heart sounds.  Pulmonary:     Effort: Pulmonary effort is normal.     Breath sounds: Normal breath sounds.  Musculoskeletal:        General:  Normal range of motion.     Cervical back: Normal range of motion and neck supple.  Neurological:     General: No focal deficit present.      Mental Status: He is alert and oriented to person, place, and time.  Psychiatric:        Mood and Affect: Mood normal.        Behavior: Behavior normal.     ASSESSMENT AND PLAN: 1. Hospital discharge follow-up - Reviewed hospital course, current medications, ensured proper follow-up in place, and addressed concerns.   2. Type 1 diabetes mellitus with hyperglycemia (HCC) 3. Diabetic ketoacidosis without coma associated with type 1 diabetes mellitus (HCC) - Hemoglobin A1c 15.5% on 03/09/2023. - Continue Lantus and Humalog as prescribed. No refills needed as of present.  - Dexcom G6 prescribed per patient request.  - Discussed the importance of healthy eating habits, low-carbohydrate diet, low-sugar diet, regular aerobic exercise (at least 150 minutes a week as tolerated) and medication compliance to achieve or maintain control of diabetes. - Follow-up with primary provider in 1 week or sooner if needed.  - Continuous Glucose Receiver (DEXCOM G6 RECEIVER) DEVI; Use as directed 4 (four) times daily -  before meals and at bedtime.  Dispense: 1 each; Refill: 2 - Continuous Glucose Sensor (DEXCOM G6 SENSOR) MISC; Use as directed 4 (four) times daily -  before meals and at bedtime.  Dispense: 3 each; Refill: 2 - Continuous Glucose Transmitter (DEXCOM G6 TRANSMITTER) MISC; Use as directed 4 (four) times daily -  before meals and at bedtime.  Dispense: 1 each; Refill: 3  4. Acute renal failure, unspecified acute renal failure type [facility]) - Resolved prior to hospital discharge.   5. Esophageal thickening - Referral to Gastroenterology for further evaluation/management.  - Ambulatory referral to Gastroenterology  6. Leukocytosis, unspecified type - Improved prior to hospital discharge.    PATIENT EDUCATION PROVIDED: See AVS   FOLLOW-UP (Include any further testing or referrals):  - Referral to Gastroenterology.  - Follow-up with primary provider as scheduled.   Patient was given clear  instructions to go to Emergency Department or return to medical center if symptoms don't improve, worsen, or new problems develop.The patient verbalized understanding.

## 2023-03-17 NOTE — Progress Notes (Signed)
Pt wants to switch from insulin syringes to a pen.  Pt wants to get a dexcon.  Pt wants to know about insulin rates.

## 2023-03-19 ENCOUNTER — Other Ambulatory Visit: Payer: Self-pay

## 2023-03-24 ENCOUNTER — Other Ambulatory Visit: Payer: Self-pay

## 2023-04-01 ENCOUNTER — Other Ambulatory Visit: Payer: Self-pay

## 2023-04-01 ENCOUNTER — Telehealth: Payer: Self-pay | Admitting: Nurse Practitioner

## 2023-04-01 NOTE — Telephone Encounter (Signed)
Called pt and left vm to call office back to schedule appt requested via MyChart. 

## 2023-04-07 ENCOUNTER — Other Ambulatory Visit: Payer: Self-pay

## 2023-04-08 ENCOUNTER — Inpatient Hospital Stay: Payer: Medicaid Other | Admitting: Nurse Practitioner

## 2023-04-10 ENCOUNTER — Other Ambulatory Visit: Payer: Self-pay

## 2023-04-10 ENCOUNTER — Ambulatory Visit: Payer: Medicaid Other | Attending: Nurse Practitioner | Admitting: Nurse Practitioner

## 2023-04-10 VITALS — BP 113/76 | HR 72 | Ht 72.0 in | Wt 150.8 lb

## 2023-04-10 DIAGNOSIS — D649 Anemia, unspecified: Secondary | ICD-10-CM | POA: Diagnosis not present

## 2023-04-10 DIAGNOSIS — E1065 Type 1 diabetes mellitus with hyperglycemia: Secondary | ICD-10-CM | POA: Diagnosis not present

## 2023-04-10 MED ORDER — DEXCOM G6 SENSOR MISC
1.0000 | Freq: Three times a day (TID) | 2 refills | Status: DC
Start: 2023-04-10 — End: 2023-06-23
  Filled 2023-04-10: qty 3, 30d supply, fill #0
  Filled 2023-04-21 – 2023-05-05 (×3): qty 3, 30d supply, fill #1
  Filled 2023-05-25 – 2023-05-28 (×2): qty 3, 30d supply, fill #2

## 2023-04-10 MED ORDER — FREESTYLE LIBRE 3 SENSOR MISC
6 refills | Status: DC
Start: 2023-04-10 — End: 2023-08-21
  Filled 2023-04-10: qty 2, fill #0

## 2023-04-10 MED ORDER — DEXCOM G6 TRANSMITTER MISC
3 refills | Status: AC
Start: 2023-04-10 — End: ?
  Filled 2023-04-10: qty 1, 90d supply, fill #0
  Filled 2023-05-04 – 2023-06-23 (×2): qty 1, 90d supply, fill #1

## 2023-04-10 MED ORDER — INSULIN LISPRO (1 UNIT DIAL) 100 UNIT/ML (KWIKPEN)
5.0000 [IU] | PEN_INJECTOR | Freq: Three times a day (TID) | SUBCUTANEOUS | 11 refills | Status: DC
Start: 2023-04-10 — End: 2024-06-03
  Filled 2023-04-10: qty 15, 100d supply, fill #0
  Filled 2023-04-10: qty 15, 30d supply, fill #0
  Filled 2023-05-06: qty 15, 30d supply, fill #1
  Filled 2023-06-24: qty 15, 30d supply, fill #2
  Filled 2023-07-24: qty 15, 30d supply, fill #3
  Filled 2023-08-24: qty 15, 30d supply, fill #4
  Filled 2023-10-08: qty 15, 30d supply, fill #5
  Filled 2023-11-13: qty 15, 30d supply, fill #6
  Filled 2023-12-09: qty 15, 30d supply, fill #7
  Filled 2024-01-12: qty 15, 30d supply, fill #8
  Filled 2024-03-01: qty 15, 30d supply, fill #9
  Filled 2024-04-07: qty 15, 30d supply, fill #10

## 2023-04-10 MED ORDER — DEXCOM G6 RECEIVER DEVI
2 refills | Status: DC
Start: 2023-04-10 — End: 2023-09-04
  Filled 2023-04-10: qty 1, 90d supply, fill #0

## 2023-04-10 MED ORDER — FREESTYLE LIBRE 3 READER DEVI
0 refills | Status: DC
Start: 2023-04-10 — End: 2023-08-21
  Filled 2023-04-10: qty 1, fill #0

## 2023-04-10 MED ORDER — INSULIN LISPRO (1 UNIT DIAL) 100 UNIT/ML (KWIKPEN)
5.0000 [IU] | PEN_INJECTOR | Freq: Three times a day (TID) | SUBCUTANEOUS | 11 refills | Status: DC
Start: 2023-04-10 — End: 2023-04-10
  Filled 2023-04-10: qty 15, 100d supply, fill #0

## 2023-04-10 NOTE — Progress Notes (Signed)
Assessment & Plan:  [name] was seen today for medical management of chronic issues.  Diagnoses and all orders for this visit:  Type 1 diabetes mellitus with hyperglycemia (HCC) -     Urine Albumin/Creatinine with ratio (send out) [LAB689] -     Ambulatory referral to Ophthalmology -     Discontinue: insulin lispro (HUMALOG KWIKPEN) 100 UNIT/ML KwikPen; Inject 5 Units into the skin 3 (three) times daily after meals. -     Continuous Glucose Sensor (FREESTYLE LIBRE 3 SENSOR) MISC; Check blood glucose levels 6 times per day E11.65 -     Continuous Glucose Receiver (FREESTYLE LIBRE 3 READER) DEVI; Check blood glucose levels 6 times per day E11.65 -     Continuous Glucose Receiver (DEXCOM G6 RECEIVER) DEVI; Check blood glucose levels 6 times per day E11.65 -     Continuous Glucose Sensor (DEXCOM G6 SENSOR) MISC; 1 each by Other route 4 (four) times daily -  before meals and at bedtime. Check blood glucose levels 6 times per day E11.65 -     Continuous Glucose Transmitter (DEXCOM G6 TRANSMITTER) MISC; Check blood glucose levels 6 times per day E11.65 -     insulin lispro (HUMALOG KWIKPEN) 100 UNIT/ML KwikPen; Inject 5 Units into the skin 3 (three) times daily after meals. For blood sugars 0-150 give 0 units of insulin, 151-200 give 2 units of insulin, 201-250 give 4 units, 251-300 give 6 units, 301-350 give 8 units, 351-400 give 10 units,> 400 give 12 units and call M.D. Discussed hypoglycemia protocol. -     Ambulatory referral to Endocrinology -     CMP14+EGFR  Anemia, unspecified type -     CBC with Differential    Patient has been counseled on age-appropriate routine health concerns for screening and prevention. These are reviewed and up-to-date. Referrals have been placed accordingly. Immunizations are up-to-date or declined.    Subjective:   Chief Complaint  Patient presents with   Medical Management of Chronic Issues   HPI Joseph Barr 27 y.o. male presents to office today for  request of dexcom or freestyle libre. He has a history fo T1DM.  METER averages are as follows:  7 day 332 14 day 264  30 day 258 90 day 249  He is currently administering 18 units of lantus BID and humalog 5 units TID with sliding scale. Reports dietary nonadherence but trying to do better. Had been unable to refer to endocrinology in the past but now that he has medicaid we will place referral. Also placed referral for ophthalmology. He has has 2 hospitalizations for DKA since May of this year.  Lab Results  Component Value Date   HGBA1C >15.5 (H) 03/09/2023      Review of Systems  Constitutional:  Negative for fever, malaise/fatigue and weight loss.  HENT: Negative.  Negative for nosebleeds.   Eyes: Negative.  Negative for blurred vision, double vision and photophobia.  Respiratory: Negative.  Negative for cough and shortness of breath.   Cardiovascular: Negative.  Negative for chest pain, palpitations and leg swelling.  Gastrointestinal: Negative.  Negative for heartburn, nausea and vomiting.  Musculoskeletal: Negative.  Negative for myalgias.  Neurological: Negative.  Negative for dizziness, focal weakness, seizures and headaches.  Psychiatric/Behavioral: Negative.  Negative for suicidal ideas.     Past Medical History:  Diagnosis Date   Diabetes mellitus     No past surgical history on file.  Family History  Problem Relation Age of Onset   Cancer  Maternal Grandfather    Hypertension Mother    Diabetes Neg Hx     Social History Reviewed with no changes to be made today.   Outpatient Medications Prior to Visit  Medication Sig Dispense Refill   Accu-Chek Softclix Lancets lancets Use up to four times daily as directed. 100 each 0   acetaminophen (TYLENOL) 500 MG tablet Take 500 mg by mouth every 6 (six) hours as needed for headache.     blood glucose meter kit and supplies Use up to four times daily as directed. (FOR ICD-10 E10.9, E11.9). 1 each 0   Blood Glucose  Monitoring Suppl (TRUE METRIX METER) w/Device KIT Use as directed 1 kit 0   glucose blood (TRUE METRIX BLOOD GLUCOSE TEST) test strip Use as instructed 100 each 12   glucose blood test strip use as directed to test blood sugars four times a day 100 each 11   glucose blood test strip Use up to four times daily as directed. 100 strip 0   insulin glargine (LANTUS) 100 UNIT/ML Solostar Pen Inject 18 Units into the skin 2 (two) times daily. 15 mL 2   Insulin Pen Needle 31G X 5 MM MISC Use to inject Lantus twice daily. 100 each 11   Insulin Syringe-Needle U-100 31G X 15/64" 0.3 ML MISC Use to inject Humalog  3 times a day.  Keep upcoming appt for additional refills. 100 each 11   TRUEplus Lancets 28G MISC use as directed to test blood sugars four times a day 100 each 11   insulin lispro (HUMALOG) 100 UNIT/ML injection Inject 0.05 mLs (5 Units total) into the skin 3 (three) times daily with meals. 10 mL 3   ondansetron (ZOFRAN) 4 MG tablet Take 1 tablet (4 mg total) by mouth every 6 (six) hours as needed for nausea. (Patient not taking: Reported on 03/17/2023) 30 tablet 0   pantoprazole (PROTONIX) 40 MG tablet Take 1 tablet (40 mg total) by mouth 2 (two) times daily. (Patient not taking: Reported on 04/10/2023) 60 tablet 2   Continuous Glucose Receiver (DEXCOM G6 RECEIVER) DEVI Use as directed 4 (four) times daily -  before meals and at bedtime. (Patient not taking: Reported on 04/10/2023) 1 each 2   Continuous Glucose Sensor (DEXCOM G6 SENSOR) MISC Use as directed 4 (four) times daily -  before meals and at bedtime. (Patient not taking: Reported on 04/10/2023) 3 each 2   Continuous Glucose Transmitter (DEXCOM G6 TRANSMITTER) MISC Use as directed 4 (four) times daily -  before meals and at bedtime. (Patient not taking: Reported on 04/10/2023) 1 each 3   No facility-administered medications prior to visit.    No Known Allergies     Objective:    BP 113/76 (BP Location: Left Arm, Patient Position: Sitting, Cuff  Size: Normal)   Pulse 72   Ht 6' (1.829 m)   Wt 150 lb 12.8 oz (68.4 kg)   SpO2 98%   BMI 20.45 kg/m  Wt Readings from Last 3 Encounters:  04/10/23 150 lb 12.8 oz (68.4 kg)  03/17/23 151 lb 9.6 oz (68.8 kg)  03/07/23 150 lb (68 kg)    Physical Exam Vitals and nursing note reviewed.  Constitutional:      Appearance: He is well-developed.  HENT:     Head: Normocephalic and atraumatic.  Cardiovascular:     Rate and Rhythm: Normal rate and regular rhythm.     Heart sounds: Normal heart sounds. No murmur heard.    No friction  rub. No gallop.  Pulmonary:     Effort: Pulmonary effort is normal. No tachypnea or respiratory distress.     Breath sounds: Normal breath sounds. No decreased breath sounds, wheezing, rhonchi or rales.  Chest:     Chest wall: No tenderness.  Abdominal:     General: Bowel sounds are normal.     Palpations: Abdomen is soft.  Musculoskeletal:        General: Normal range of motion.     Cervical back: Normal range of motion.  Skin:    General: Skin is warm and dry.  Neurological:     Mental Status: He is alert and oriented to person, place, and time.     Coordination: Coordination normal.  Psychiatric:        Behavior: Behavior normal. Behavior is cooperative.        Thought Content: Thought content normal.        Judgment: Judgment normal.          Patient has been counseled extensively about nutrition and exercise as well as the importance of adherence with medications and regular follow-up. The patient was given clear instructions to go to ER or return to medical center if symptoms don't improve, worsen or new problems develop. The patient verbalized understanding.   Follow-up: Return for f/u in mid october unless seen by endo first. .   Claiborne Rigg, FNP-BC [facility] [place], [place] [phone]   04/10/2023, 2:18 PM

## 2023-04-13 ENCOUNTER — Other Ambulatory Visit: Payer: Self-pay

## 2023-04-15 ENCOUNTER — Other Ambulatory Visit: Payer: Self-pay

## 2023-04-16 ENCOUNTER — Other Ambulatory Visit: Payer: Self-pay | Admitting: Nurse Practitioner

## 2023-04-16 DIAGNOSIS — R809 Proteinuria, unspecified: Secondary | ICD-10-CM

## 2023-04-16 MED ORDER — LISINOPRIL 2.5 MG PO TABS
2.5000 mg | ORAL_TABLET | Freq: Every day | ORAL | 3 refills | Status: AC
Start: 2023-04-16 — End: ?
  Filled 2023-04-16: qty 90, 90d supply, fill #0
  Filled 2023-07-24: qty 90, 90d supply, fill #1

## 2023-04-17 ENCOUNTER — Other Ambulatory Visit: Payer: Self-pay

## 2023-04-21 ENCOUNTER — Other Ambulatory Visit: Payer: Self-pay | Admitting: Nurse Practitioner

## 2023-04-21 ENCOUNTER — Other Ambulatory Visit: Payer: Self-pay

## 2023-04-21 ENCOUNTER — Encounter: Payer: Self-pay | Admitting: Nurse Practitioner

## 2023-04-21 DIAGNOSIS — E1010 Type 1 diabetes mellitus with ketoacidosis without coma: Secondary | ICD-10-CM

## 2023-04-21 DIAGNOSIS — E1042 Type 1 diabetes mellitus with diabetic polyneuropathy: Secondary | ICD-10-CM

## 2023-04-21 MED ORDER — GABAPENTIN 100 MG PO CAPS
100.0000 mg | ORAL_CAPSULE | Freq: Three times a day (TID) | ORAL | 3 refills | Status: AC
Start: 2023-04-21 — End: ?
  Filled 2023-04-21: qty 90, 30d supply, fill #0
  Filled 2023-05-25: qty 90, 30d supply, fill #1
  Filled 2023-07-24: qty 90, 30d supply, fill #2

## 2023-04-23 ENCOUNTER — Other Ambulatory Visit: Payer: Self-pay

## 2023-04-29 ENCOUNTER — Other Ambulatory Visit: Payer: Self-pay

## 2023-05-05 ENCOUNTER — Other Ambulatory Visit: Payer: Self-pay

## 2023-05-07 ENCOUNTER — Other Ambulatory Visit: Payer: Self-pay

## 2023-05-08 ENCOUNTER — Other Ambulatory Visit: Payer: Self-pay

## 2023-05-25 ENCOUNTER — Other Ambulatory Visit: Payer: Self-pay

## 2023-06-15 ENCOUNTER — Other Ambulatory Visit: Payer: Self-pay

## 2023-06-22 ENCOUNTER — Ambulatory Visit: Payer: Medicaid Other | Admitting: Nurse Practitioner

## 2023-06-23 ENCOUNTER — Other Ambulatory Visit: Payer: Self-pay | Admitting: Nurse Practitioner

## 2023-06-23 ENCOUNTER — Other Ambulatory Visit: Payer: Self-pay

## 2023-06-23 DIAGNOSIS — E1065 Type 1 diabetes mellitus with hyperglycemia: Secondary | ICD-10-CM

## 2023-06-24 ENCOUNTER — Other Ambulatory Visit: Payer: Self-pay

## 2023-06-25 ENCOUNTER — Other Ambulatory Visit: Payer: Self-pay

## 2023-06-25 MED ORDER — DEXCOM G6 SENSOR MISC
1.0000 | Freq: Three times a day (TID) | 2 refills | Status: DC
Start: 2023-06-25 — End: 2023-08-21
  Filled 2023-06-25: qty 3, 30d supply, fill #0
  Filled 2023-07-24: qty 3, 30d supply, fill #1

## 2023-06-26 ENCOUNTER — Other Ambulatory Visit: Payer: Self-pay

## 2023-07-15 IMAGING — US US EXTREM LOW*L* LIMITED
1 series · 15 of 21 positions shown · non-contrast
Comparison: None.

CLINICAL DATA: Left leg pain for 2 days, findings suggestive of
cellulitis

EXAM:
ULTRASOUND LEFT LOWER EXTREMITY LIMITED
TECHNIQUE: Ultrasound examination of the lower extremity soft tissues was
performed in the area of clinical concern.

[Series 1: us extrem low bilat ltd mc & wl · 21 acquisitions, 15 frames shown]
[im 1/21]
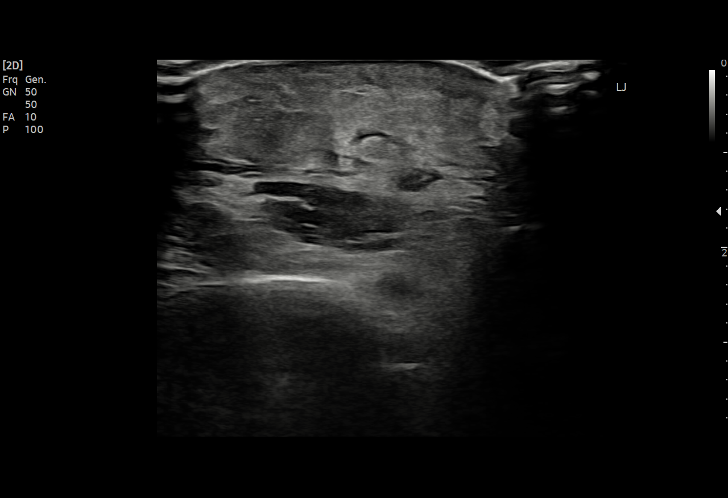
[im 3/21]
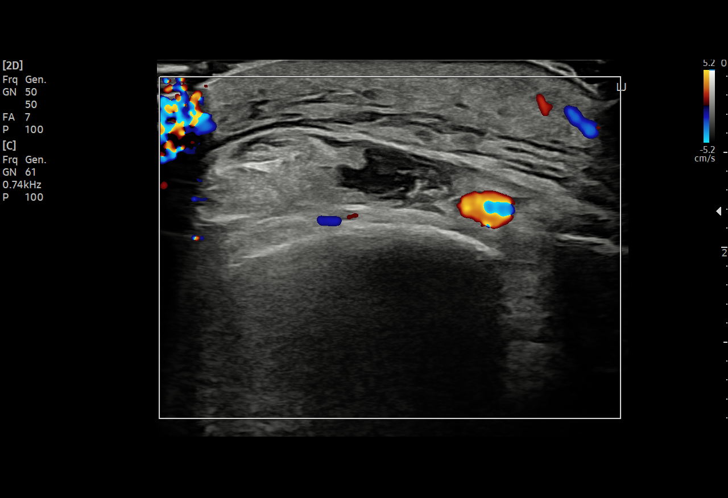
[im 4/21]
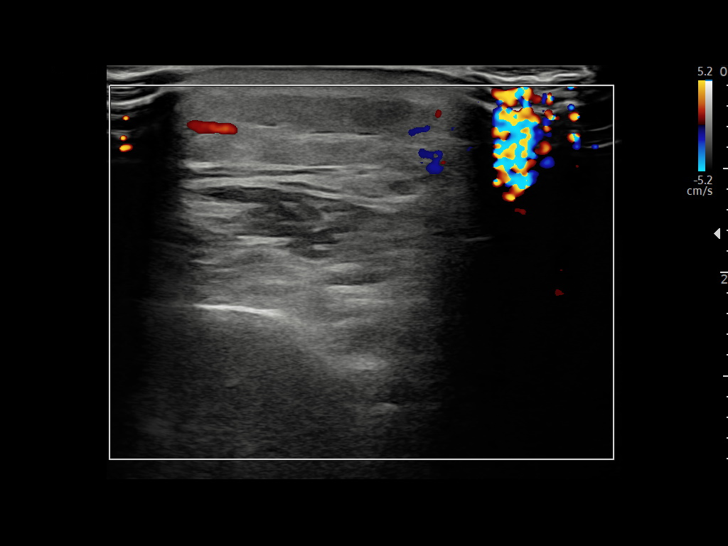
[im 5/21]
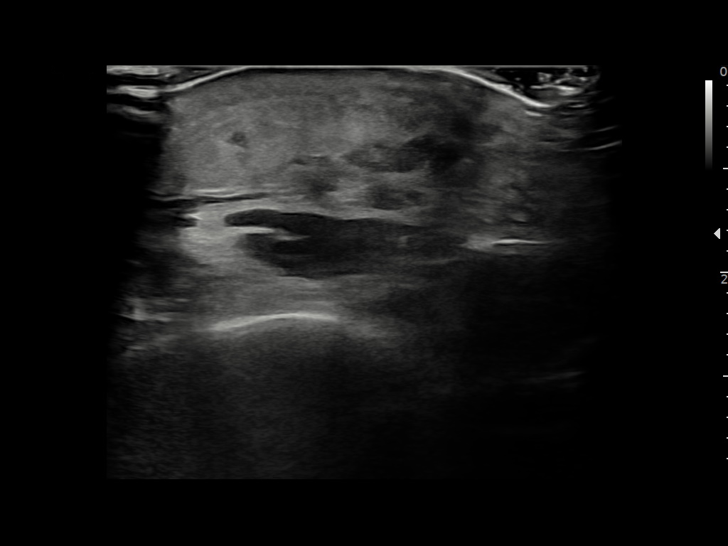
[im 7/21]
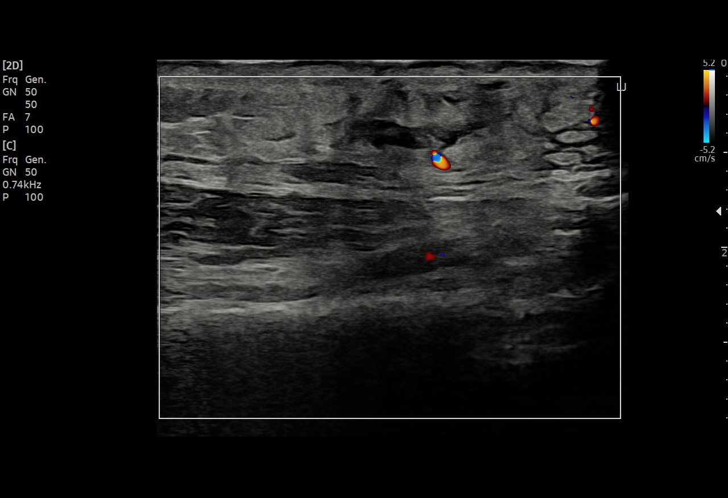
[im 8/21]
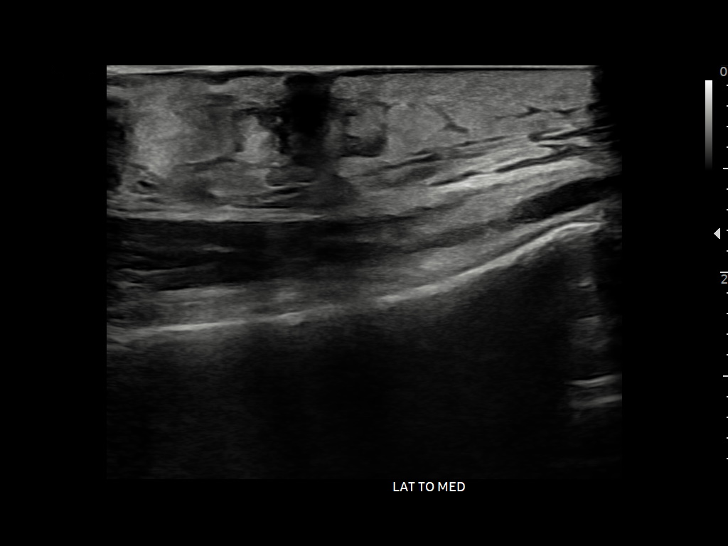
[im 10/21]
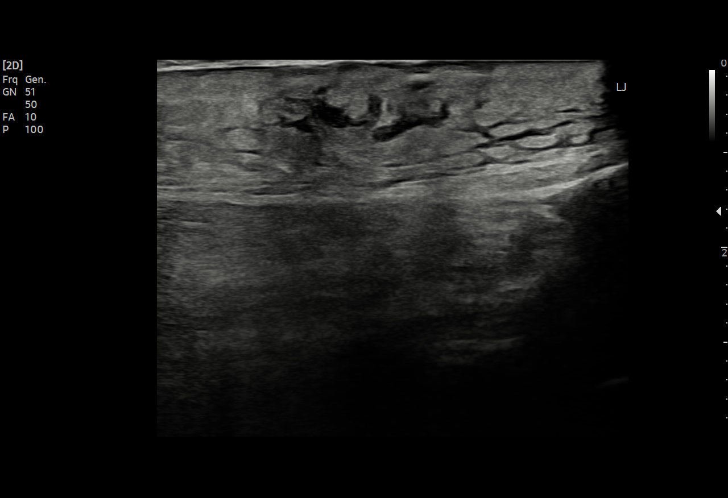
[im 11/21]
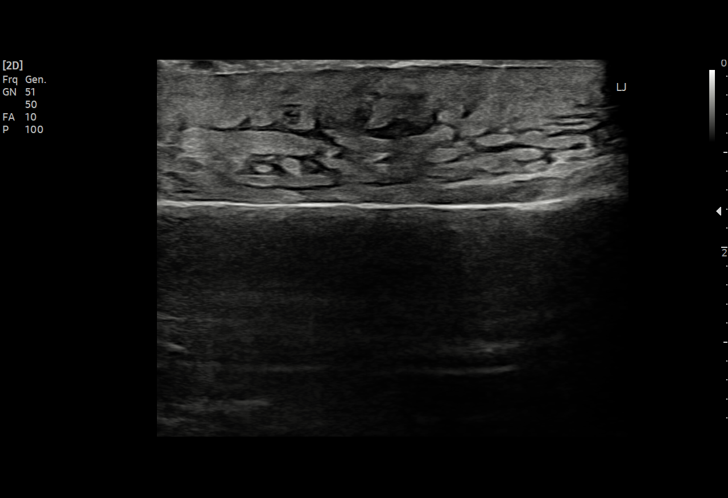
[im 12/21]
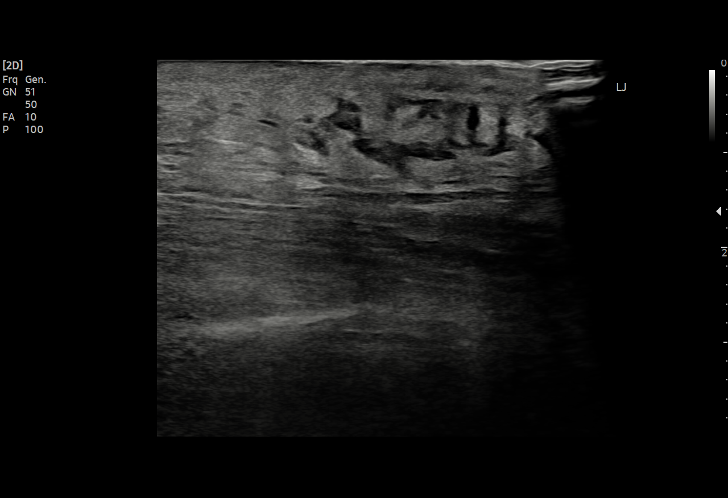
[im 14/21]
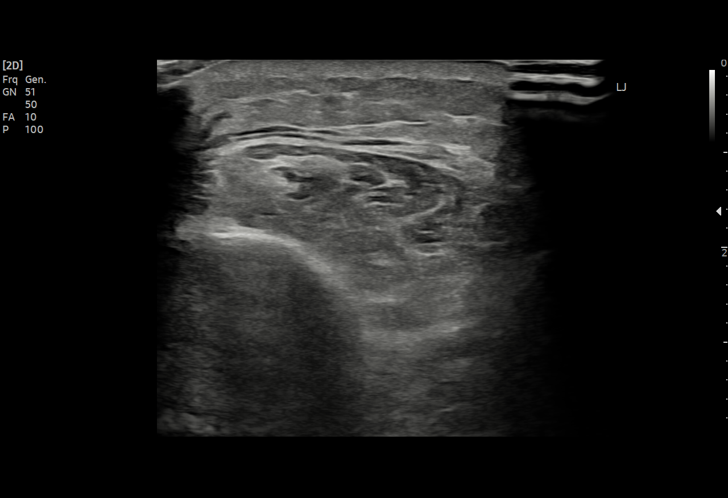
[im 15/21]
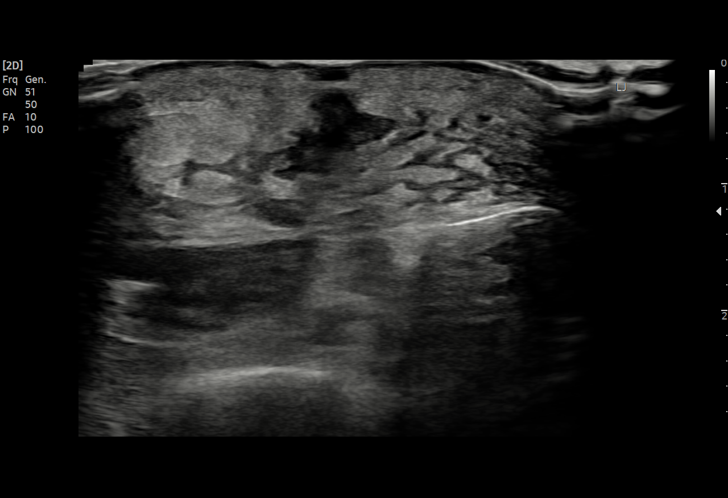
[im 17/21]
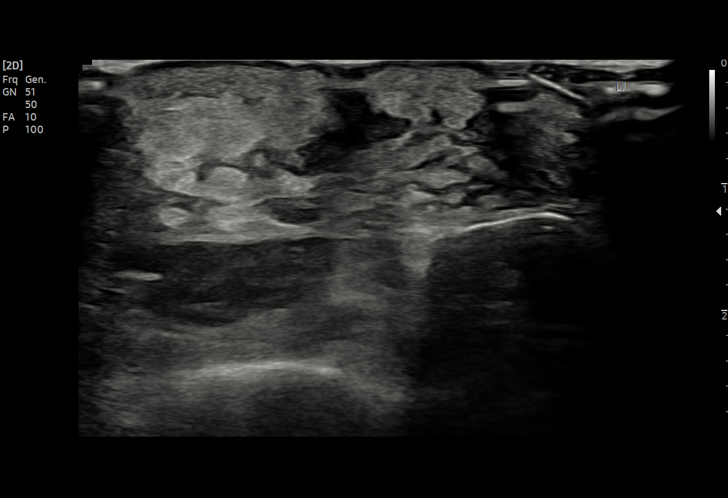
[im 18/21]
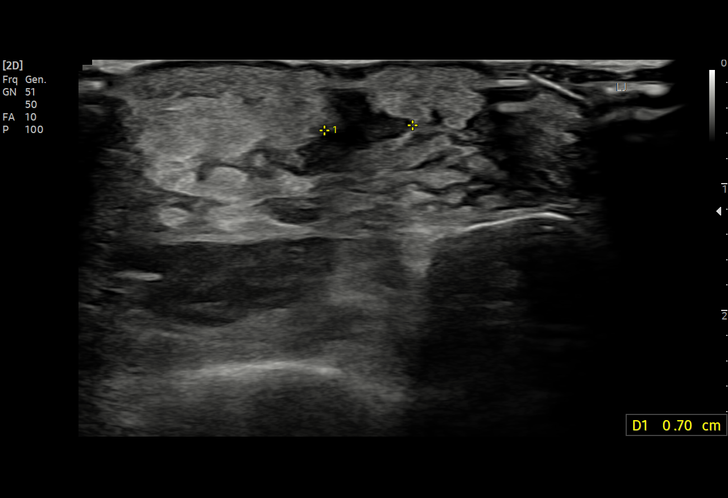
[im 19/21]
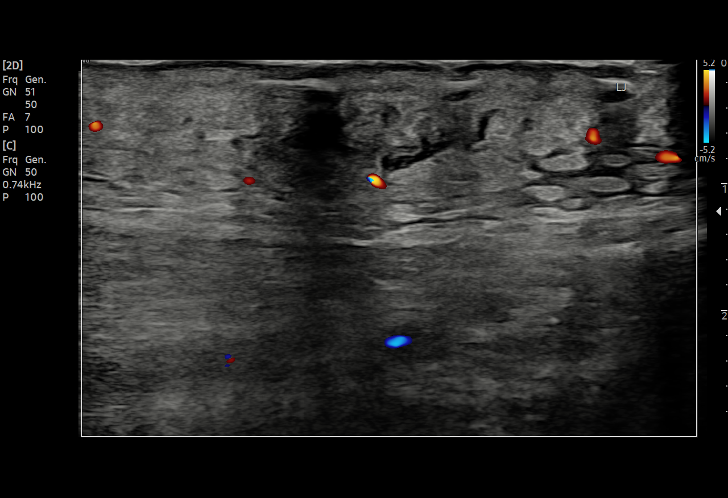
[im 21/21]
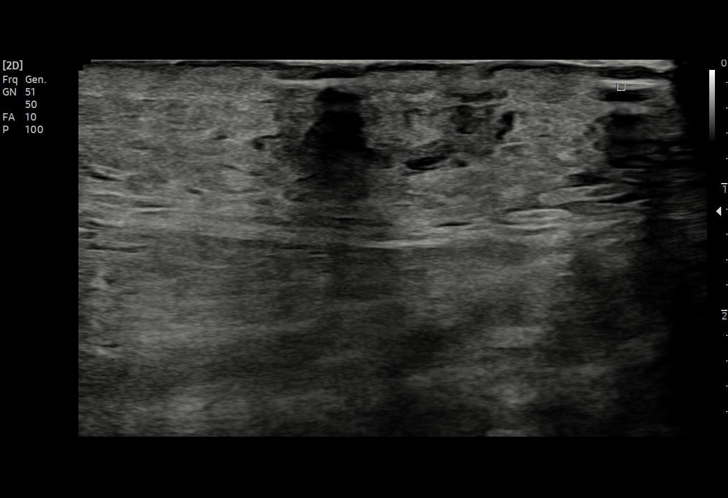

[15 of 21 positions shown; findings below may reference images not displayed]

FINDINGS: Scanning in the area of clinical concern shows significant
subcutaneous edema as well as a more focal fluid collection
measuring 5 x 5 x 7 mm in dimension. This may represent a small
subcutaneous abscess.
IMPRESSION: Subcutaneous edema consistent with the given clinical history of
cellulitis. A focal 5 x 7 mm area of fluid is noted which may
represent a tiny subcutaneous abscess.

## 2023-07-24 ENCOUNTER — Other Ambulatory Visit: Payer: Self-pay

## 2023-07-27 ENCOUNTER — Other Ambulatory Visit: Payer: Self-pay

## 2023-07-28 ENCOUNTER — Other Ambulatory Visit: Payer: Self-pay

## 2023-08-07 ENCOUNTER — Other Ambulatory Visit: Payer: Self-pay

## 2023-08-07 DIAGNOSIS — E1010 Type 1 diabetes mellitus with ketoacidosis without coma: Secondary | ICD-10-CM

## 2023-08-14 ENCOUNTER — Other Ambulatory Visit: Payer: Self-pay

## 2023-08-21 ENCOUNTER — Encounter: Payer: Self-pay | Admitting: "Endocrinology

## 2023-08-21 ENCOUNTER — Ambulatory Visit (INDEPENDENT_AMBULATORY_CARE_PROVIDER_SITE_OTHER): Payer: Self-pay | Admitting: "Endocrinology

## 2023-08-21 ENCOUNTER — Other Ambulatory Visit: Payer: Self-pay

## 2023-08-21 VITALS — BP 122/78 | HR 110 | Ht 72.0 in | Wt 141.0 lb

## 2023-08-21 DIAGNOSIS — E1065 Type 1 diabetes mellitus with hyperglycemia: Secondary | ICD-10-CM

## 2023-08-21 DIAGNOSIS — E1010 Type 1 diabetes mellitus with ketoacidosis without coma: Secondary | ICD-10-CM

## 2023-08-21 MED ORDER — BAQSIMI ONE PACK 3 MG/DOSE NA POWD
1.0000 | NASAL | 3 refills | Status: AC | PRN
  Filled 2023-08-21: qty 2, 30d supply, fill #0

## 2023-08-21 MED ORDER — INSULIN GLARGINE 100 UNIT/ML SOLOSTAR PEN
18.0000 [IU] | PEN_INJECTOR | Freq: Two times a day (BID) | SUBCUTANEOUS | 5 refills | Status: DC
  Filled 2023-08-21: qty 12, 33d supply, fill #0
  Filled 2023-10-08: qty 12, 33d supply, fill #1
  Filled 2024-01-12: qty 9, 25d supply, fill #2
  Filled 2024-04-07: qty 9, 25d supply, fill #3
  Filled 2024-05-01: qty 9, 25d supply, fill #4
  Filled 2024-05-30: qty 9, 25d supply, fill #5
  Filled 2024-07-06: qty 9, 25d supply, fill #6
  Filled 2024-08-02: qty 9, 25d supply, fill #7

## 2023-08-21 MED ORDER — DEXCOM G7 SENSOR MISC
1.0000 | 1 refills | Status: DC
  Filled 2023-08-21 (×2): qty 3, 30d supply, fill #0
  Filled 2023-09-15: qty 3, 30d supply, fill #1
  Filled 2023-10-14 (×2): qty 3, 30d supply, fill #2
  Filled 2023-11-13: qty 3, 30d supply, fill #3
  Filled 2023-12-09: qty 3, 30d supply, fill #4
  Filled 2024-01-12 (×2): qty 3, 30d supply, fill #5

## 2023-08-21 NOTE — Patient Instructions (Signed)
Lantus 18 units bid  Humalog 4 units for break fast, 8 - 10 units for lunch and 8 -10 for supper + SS 1:25>150 (usually takes 8-12 units/day)

## 2023-08-21 NOTE — Progress Notes (Signed)
Outpatient Endocrinology Note Joseph Fern [place], MD  08/21/23   Joseph Barr 17-Nov-1995 [id number]  Referring Provider: Claiborne Rigg, NP Primary Care Provider: Claiborne Rigg, NP Reason for consultation: Subjective   Assessment & Plan  Joseph Barr was seen today for establish care.  Diagnoses and all orders for this visit:  Uncontrolled type 1 diabetes mellitus with hyperglycemia (HCC) -     Ambulatory referral to diabetic education  Type 1 diabetes mellitus with ketoacidosis without coma (HCC) -     insulin glargine (LANTUS) 100 UNIT/ML Solostar Pen; Inject 18 Units into the skin 2 (two) times daily.  Other orders -     Glucagon (BAQSIMI ONE PACK) 3 MG/DOSE POWD; Place 1 Device into the nose as needed (Low blood sugar with impaired consciousness). -     Continuous Glucose Sensor (DEXCOM G7 SENSOR) MISC; 1 Device by Does not apply route continuous.    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, No results found for: "GFR" Hba1c goal less than 7, current Hba1c is  Lab Results  Component Value Date   HGBA1C 11.1 (H) 08/14/2023   Will recommend the following: Lantus 18 units bid  Humalog 4 units for break fast, 8 - 10 units for lunch and 8 -10 for supper + SS 1:25>150 (usually takes 8-12 units/day) Interested in pump, ordered DM education  No known contraindications/side effects to any of above medications Glucagon Baqsimi discussed and prescribed with refills on 08/21/23    -Last LD and Tg are as follows: Lab Results  Component Value Date   LDLCALC 104 (H) 08/14/2023    Lab Results  Component Value Date   TRIG 157 (H) 08/14/2023   -not on statin  -Follow low fat diet and exercise   -Blood pressure goal <140/90 - Microalbumin/creatinine goal is < 30 -Last MA/Cr is as follows: Lab Results  Component Value Date   MICROALBUR 2.2 08/14/2023   -on ACE/ARB lisinopril 2.5 mg/day  -diet changes including salt restriction -limit eating outside -counseled BP  targets per standards of diabetes care -uncontrolled blood pressure can lead to retinopathy, nephropathy and cardiovascular and atherosclerotic heart disease  Reviewed and counseled on: -A1C target -Blood sugar targets -Complications of uncontrolled diabetes  -Checking blood sugar before meals and bedtime and bring log next visit -All medications with mechanism of action and side effects -Hypoglycemia management: rule of 15's, Glucagon Emergency Kit and medical alert ID -low-carb low-fat plate-method diet -At least 20 minutes of physical activity per day -Annual dilated retinal eye exam and foot exam -compliance and follow up needs -follow up as scheduled or earlier if problem gets worse  Call if blood sugar is less than 70 or consistently above 250    Take a 15 gm snack of carbohydrate at bedtime before you go to sleep if your blood sugar is less than 100.    If you are going to fast after midnight for a test or procedure, ask your physician for instructions on how to reduce/decrease your insulin dose.    Call if blood sugar is less than 70 or consistently above 250  -Treating a low sugar by rule of 15  (15 gms of sugar every 15 min until sugar is more than 70) If you feel your sugar is low, test your sugar to be sure If your sugar is low (less than 70), then take 15 grams of a fast acting Carbohydrate (3-4 glucose tablets or glucose gel or 4 ounces of juice or regular soda) Recheck  your sugar 15 min after treating low to make sure it is more than 70 If sugar is still less than 70, treat again with 15 grams of carbohydrate          Don't drive the hour of hypoglycemia  If unconscious/unable to eat or drink by mouth, use glucagon injection or nasal spray baqsimi and call 911. Can repeat again in 15 min if still unconscious.  Return in about 2 weeks (around 09/04/2023).   I have reviewed current medications, nurse's notes, allergies, vital signs, past medical and surgical history,  family medical history, and social history for this encounter. Counseled patient on symptoms, examination findings, lab findings, imaging results, treatment decisions and monitoring and prognosis. The patient understood the recommendations and agrees with the treatment plan. All questions regarding treatment plan were fully answered.  Joseph Trenton, MD  08/21/23    History of Present Illness Joseph Barr is a 27 y.o. year old male who presents for evaluation of Type I diabetes mellitus.  Joseph Barr was first diagnosed at age 27.  Diabetes education +  Home diabetes regimen: Lantus 18 units bid  Humalog 5 units + SS 1:25>150 (usually takes 8-12 units/day)  COMPLICATIONS -  MI/Stroke -  retinopathy +  neuropathy -  nephropathy  SYMPTOMS REVIEWED - Polyuria + Weight loss - Blurred vision  BLOOD SUGAR DATA  CGM interpretation: At today's visit, we reviewed her CGM downloads. The full report is scanned in the media. Reviewing the CGM trends, BG are elevated all across the day with some lows around 9 am.   Physical Exam  BP 122/78 (BP Location: Left Arm, Patient Position: Sitting, Cuff Size: Normal)   Pulse (!) 110   Ht 6' (1.829 m)   Wt 141 lb (64 kg)   SpO2 98%   BMI 19.12 kg/m    Constitutional: well developed, well nourished Head: normocephalic, atraumatic Eyes: sclera anicteric, no redness Neck: supple Lungs: normal respiratory effort Neurology: alert and oriented Skin: dry, no appreciable rashes Musculoskeletal: no appreciable defects Psychiatric: normal mood and affect Diabetic Foot Exam - Simple   No data filed      Current Medications Patient's Medications  New Prescriptions   CONTINUOUS GLUCOSE SENSOR (DEXCOM G7 SENSOR) MISC    1 Device by Does not apply route continuous.   GLUCAGON (BAQSIMI ONE PACK) 3 MG/DOSE POWD    Place 1 Device into the nose as needed (Low blood sugar with impaired consciousness).  Previous Medications   ACCU-CHEK SOFTCLIX  LANCETS LANCETS    Use up to four times daily as directed.   ACETAMINOPHEN (TYLENOL) 500 MG TABLET    Take 500 mg by mouth every 6 (six) hours as needed for headache.   BLOOD GLUCOSE METER KIT AND SUPPLIES    Use up to four times daily as directed. (FOR ICD-10 E10.9, E11.9).   BLOOD GLUCOSE MONITORING SUPPL (TRUE METRIX METER) W/DEVICE KIT    Use as directed   CONTINUOUS GLUCOSE RECEIVER (DEXCOM G6 RECEIVER) DEVI    Check blood glucose levels 6 times per day E11.65   CONTINUOUS GLUCOSE TRANSMITTER (DEXCOM G6 TRANSMITTER) MISC    Check blood glucose levels 6 times per day E11.65   GABAPENTIN (NEURONTIN) 100 MG CAPSULE    Take 1 capsule (100 mg total) by mouth 3 (three) times daily.   GLUCOSE BLOOD (TRUE METRIX BLOOD GLUCOSE TEST) TEST STRIP    Use as instructed   GLUCOSE BLOOD TEST STRIP    use as  directed to test blood sugars four times a day   GLUCOSE BLOOD TEST STRIP    Use up to four times daily as directed.   INSULIN LISPRO (HUMALOG KWIKPEN) 100 UNIT/ML KWIKPEN    Inject 5 Units into the skin 3 (three) times daily after meals. For blood sugars 0-150 give 0 units of insulin, 151-200 give 2 units of insulin, 201-250 give 4 units, 251-300 give 6 units, 301-350 give 8 units, 351-400 give 10 units,> 400 give 12 units and call M.D. Discussed hypoglycemia protocol.   INSULIN PEN NEEDLE 31G X 5 MM MISC    Use to inject Lantus twice daily.   INSULIN SYRINGE-NEEDLE U-100 31G X 15/64" 0.3 ML MISC    Use to inject Humalog  3 times a day.  Keep upcoming appt for additional refills.   LIDOCAINE (XYLOCAINE) 2 % SOLUTION    SMARTSIG:15 Milliliter(s) By Mouth Every 4 Hours PRN   LISINOPRIL (ZESTRIL) 2.5 MG TABLET    Take 1 tablet (2.5 mg total) by mouth daily. To help protect kidneys   ONDANSETRON (ZOFRAN) 4 MG TABLET    Take 1 tablet (4 mg total) by mouth every 6 (six) hours as needed for nausea.   PANTOPRAZOLE (PROTONIX) 40 MG TABLET    Take 1 tablet (40 mg total) by mouth 2 (two) times daily.   TRUEPLUS  LANCETS 28G MISC    use as directed to test blood sugars four times a day  Modified Medications   Modified Medication Previous Medication   INSULIN GLARGINE (LANTUS) 100 UNIT/ML SOLOSTAR PEN insulin glargine (LANTUS) 100 UNIT/ML Solostar Pen      Inject 18 Units into the skin 2 (two) times daily.    Inject 18 Units into the skin 2 (two) times daily.  Discontinued Medications   CONTINUOUS GLUCOSE RECEIVER (FREESTYLE LIBRE 3 READER) DEVI    Check blood glucose levels 6 times per day E11.65   CONTINUOUS GLUCOSE SENSOR (DEXCOM G6 SENSOR) MISC    use as directed  4 (four) times daily -  before meals and at bedtime. Check blood glucose levels 6 times per day E11.65   CONTINUOUS GLUCOSE SENSOR (FREESTYLE LIBRE 3 SENSOR) MISC    Check blood glucose levels 6 times per day E11.65    Allergies No Known Allergies  Past Medical History Past Medical History:  Diagnosis Date   Diabetes mellitus     Past Surgical History History reviewed. No pertinent surgical history.  Family History family history includes Cancer in his maternal grandfather; Hypertension in his mother.  Social History Social History   Socioeconomic History   Marital status: Single    Spouse name: Not on file   Number of children: Not on file   Years of education: Not on file   Highest education level: Associate degree: academic program  Occupational History   Not on file  Tobacco Use   Smoking status: Former    Types: E-cigarettes   Smokeless tobacco: Former  Building services engineer status: Former  Substance and Sexual Activity   Alcohol use: No   Drug use: Not Currently    Types: Marijuana   Sexual activity: Not Currently  Other Topics Concern   Not on file  Social History Narrative   Not on file   Social Drivers of Health   Financial Resource Strain: Medium Risk (04/10/2023)   Overall Financial Resource Strain (CARDIA)    Difficulty of Paying Living Expenses: Somewhat hard  Food Insecurity: No Food Insecurity  (04/10/2023)  Hunger Vital Sign    Worried About Running Out of Food in the Last Year: Never true    Ran Out of Food in the Last Year: Never true  Transportation Needs: No Transportation Needs (04/10/2023)   PRAPARE - Administrator, Civil Service (Medical): No    Lack of Transportation (Non-Medical): No  Physical Activity: Sufficiently Active (04/10/2023)   Exercise Vital Sign    Days of Exercise per Week: 7 days    Minutes of Exercise per Session: 150+ min  Stress: No Stress Concern Present (04/10/2023)   [employer] of Occupational Health - Occupational Stress Questionnaire    Feeling of Stress : Not at all  Social Connections: Moderately Isolated (04/10/2023)   Social Connection and Isolation Panel [NHANES]    Frequency of Communication with Friends and Family: More than three times a week    Frequency of Social Gatherings with Friends and Family: More than three times a week    Attends Religious Services: 1 to 4 times per year    Active Member of Clubs or Organizations: No    Attends Banker Meetings: Not on file    Marital Status: Never married  Intimate Partner Violence: Not At Risk (03/08/2023)   Humiliation, Afraid, Rape, and Kick questionnaire    Fear of Current or Ex-Partner: No    Emotionally Abused: No    Physically Abused: No    Sexually Abused: No    Lab Results  Component Value Date   HGBA1C 11.1 (H) 08/14/2023   HGBA1C >15.5 (H) 03/09/2023   HGBA1C >15.5 (H) 01/16/2023   Lab Results  Component Value Date   CHOL 187 08/14/2023   Lab Results  Component Value Date   HDL 58 08/14/2023   Lab Results  Component Value Date   LDLCALC 104 (H) 08/14/2023   Lab Results  Component Value Date   TRIG 157 (H) 08/14/2023   Lab Results  Component Value Date   CHOLHDL 3.2 08/14/2023   Lab Results  Component Value Date   CREATININE 1.12 08/14/2023   No results found for: "GFR" Lab Results  Component Value Date   MICROALBUR 2.2  08/14/2023      Component Value Date/Time   NA 135 08/14/2023 1056   NA 138 04/10/2023 1433   K 5.0 08/14/2023 1056   CL 96 (L) 08/14/2023 1056   CO2 30 08/14/2023 1056   GLUCOSE 486 (H) 08/14/2023 1056   BUN 14 08/14/2023 1056   BUN 14 04/10/2023 1433   CREATININE 1.12 08/14/2023 1056   CALCIUM 9.6 08/14/2023 1056   PROT 7.1 08/14/2023 1056   PROT 7.1 04/10/2023 1433   ALBUMIN 4.5 04/10/2023 1433   AST 14 08/14/2023 1056   ALT 11 08/14/2023 1056   ALKPHOS 61 04/10/2023 1433   BILITOT 0.5 08/14/2023 1056   BILITOT 0.3 04/10/2023 1433   GFRNONAA >60 03/09/2023 0313   GFRNONAA >89 07/03/2014 1038   GFRAA 141 12/01/2019 1113   GFRAA >89 07/03/2014 1038      Latest Ref Rng & Units 08/14/2023   10:56 AM 04/10/2023    2:33 PM 03/09/2023    3:13 AM  BMP  Glucose 65 - 99 mg/dL 188  416  606   BUN 7 - 25 mg/dL 14  14  9    Creatinine 0.60 - 1.24 mg/dL 3.01  6.01  0.93   BUN/Creat Ratio 6 - 22 (calc) SEE NOTE:  16    Sodium 135 - 146 mmol/L  135  138  135   Potassium 3.5 - 5.3 mmol/L 5.0  4.5  3.3   Chloride 98 - 110 mmol/L 96  99  100   CO2 20 - 32 mmol/L 30  26  27    Calcium 8.6 - 10.3 mg/dL 9.6  9.5  8.6        Component Value Date/Time   WBC 5.2 04/10/2023 1433   WBC 10.4 03/09/2023 0313   RBC 4.47 04/10/2023 1433   RBC 3.82 (L) 03/09/2023 0313   HGB 13.5 04/10/2023 1433   HCT 39.1 04/10/2023 1433   PLT 338 04/10/2023 1433   MCV 88 04/10/2023 1433   MCH 30.2 04/10/2023 1433   MCH 29.6 03/09/2023 0313   MCHC 34.5 04/10/2023 1433   MCHC 34.1 03/09/2023 0313   RDW 12.9 04/10/2023 1433   LYMPHSABS 1.8 04/10/2023 1433   MONOABS 1.8 (H) 03/08/2023 0016   EOSABS 0.2 04/10/2023 1433   BASOSABS 0.1 04/10/2023 1433     Parts of this note may have been dictated using voice recognition software. There may be variances in spelling and vocabulary which are unintentional. Not all errors are proofread. Please notify the Thereasa Parkin if any discrepancies are noted or if the meaning of  any statement is not clear.

## 2023-08-22 LAB — COMPREHENSIVE METABOLIC PANEL
AG Ratio: 1.5 (calc) (ref 1.0–2.5)
ALT: 11 U/L (ref 9–46)
AST: 14 U/L (ref 10–40)
Albumin: 4.3 g/dL (ref 3.6–5.1)
Alkaline phosphatase (APISO): 68 U/L (ref 36–130)
BUN: 14 mg/dL (ref 7–25)
CO2: 30 mmol/L (ref 20–32)
Calcium: 9.6 mg/dL (ref 8.6–10.3)
Chloride: 96 mmol/L — ABNORMAL LOW (ref 98–110)
Creat: 1.12 mg/dL (ref 0.60–1.24)
Globulin: 2.8 g/dL (ref 1.9–3.7)
Glucose, Bld: 486 mg/dL — ABNORMAL HIGH (ref 65–99)
Potassium: 5 mmol/L (ref 3.5–5.3)
Sodium: 135 mmol/L (ref 135–146)
Total Bilirubin: 0.5 mg/dL (ref 0.2–1.2)
Total Protein: 7.1 g/dL (ref 6.1–8.1)

## 2023-08-22 LAB — MICROALBUMIN / CREATININE URINE RATIO
Creatinine, Urine: 67 mg/dL (ref 20–320)
Microalb Creat Ratio: 33 mg/g{creat} — ABNORMAL HIGH (ref <30)
Microalb, Ur: 2.2 mg/dL

## 2023-08-22 LAB — HEMOGLOBIN A1C
Hgb A1c MFr Bld: 11.1 %{Hb} — ABNORMAL HIGH (ref <5.7)
Mean Plasma Glucose: 272 mg/dL
eAG (mmol/L): 15.1 mmol/L

## 2023-08-22 LAB — LIPID PANEL
Cholesterol: 187 mg/dL (ref <200)
HDL: 58 mg/dL (ref >=40)
LDL Cholesterol (Calc): 104 mg/dL — ABNORMAL HIGH
Non-HDL Cholesterol (Calc): 129 mg/dL (ref <130)
Total CHOL/HDL Ratio: 3.2 (calc) (ref <5.0)
Triglycerides: 157 mg/dL — ABNORMAL HIGH (ref <150)

## 2023-08-22 LAB — GAD65, IA-2, AND INSULIN AUTOANTIBODY SERUM
Glutamic Acid Decarb Ab: 237 [IU]/mL — ABNORMAL HIGH (ref <5)
IA-2 Antibody: 11.7 U/mL — ABNORMAL HIGH (ref <5.4)
Insulin Antibodies, Human: 14.9 U/mL — ABNORMAL HIGH (ref <0.4)

## 2023-08-22 LAB — C-PEPTIDE: C-Peptide: 0.21 ng/mL — ABNORMAL LOW (ref 0.80–3.85)

## 2023-08-24 ENCOUNTER — Other Ambulatory Visit: Payer: Self-pay

## 2023-08-27 ENCOUNTER — Encounter: Payer: Self-pay | Admitting: Endocrinology

## 2023-08-27 ENCOUNTER — Other Ambulatory Visit: Payer: Self-pay

## 2023-09-01 ENCOUNTER — Encounter: Payer: Self-pay | Attending: "Endocrinology | Admitting: Nutrition

## 2023-09-01 ENCOUNTER — Other Ambulatory Visit: Payer: Self-pay

## 2023-09-01 DIAGNOSIS — E1065 Type 1 diabetes mellitus with hyperglycemia: Secondary | ICD-10-CM | POA: Insufficient documentation

## 2023-09-01 NOTE — Progress Notes (Signed)
 Patient is interested in see the new pumps that are on the market.  He was shown the 4 different models.  We discussed the advantages and disadvantages of each model as well as the advantages and disadvantages of pump therapy.  He was on a Cosmo pump 20 plus years ago.  He counts carbs.  He was given brochures on each model and told to go their respective web sites and to their chat rooms and see what other people say about each model  He was also given the representatives names to call if more questions.  Once he decides which pump he wants, he was told to go online and place the order.  He had no final questions.

## 2023-09-01 NOTE — Patient Instructions (Signed)
Read over brochures given on each pump model Go to their respective websites to learn more about each model. Call if questions.

## 2023-09-04 ENCOUNTER — Encounter: Payer: Self-pay | Admitting: "Endocrinology

## 2023-09-04 ENCOUNTER — Other Ambulatory Visit: Payer: Self-pay

## 2023-09-04 ENCOUNTER — Ambulatory Visit (INDEPENDENT_AMBULATORY_CARE_PROVIDER_SITE_OTHER): Payer: Self-pay | Admitting: "Endocrinology

## 2023-09-04 VITALS — BP 118/72 | HR 100

## 2023-09-04 DIAGNOSIS — E7800 Pure hypercholesterolemia, unspecified: Secondary | ICD-10-CM

## 2023-09-04 DIAGNOSIS — E1065 Type 1 diabetes mellitus with hyperglycemia: Secondary | ICD-10-CM

## 2023-09-04 DIAGNOSIS — E1010 Type 1 diabetes mellitus with ketoacidosis without coma: Secondary | ICD-10-CM

## 2023-09-04 MED ORDER — INSULIN PEN NEEDLE 31G X 5 MM MISC
11 refills | Status: DC
Start: 2023-09-04 — End: 2023-10-23
  Filled 2023-09-04: qty 100, fill #0
  Filled 2023-09-15: qty 100, 50d supply, fill #0
  Filled 2023-10-08 – 2023-10-14 (×3): qty 100, 50d supply, fill #1
  Filled 2023-10-19: qty 100, 14d supply, fill #1
  Filled 2023-10-22: qty 100, 50d supply, fill #1
  Filled 2023-10-23: qty 100, 13d supply, fill #1
  Filled 2023-10-23: qty 100, 50d supply, fill #1

## 2023-09-04 NOTE — Patient Instructions (Addendum)
 Lantus  18 units bid  Humalog  6 units + sliding scale. Increase by 1 unit everyday until 2 hour after meal glucose reaches 150 ____________  Goals of DM therapy:  Morning Fasting blood sugar: 80-140  Blood sugar before meals: 80-140 Bed time blood sugar: 100-150  A1C <7%, limited only by hypoglycemia  1.Diabetes medications and their side effects discussed, including hypoglycemia    2. Check blood glucose:  a) Always check blood sugars before driving. Please see below (under hypoglycemia) on how to manage b) Check a minimum of 3 times/day or more as needed when having symptoms of hypoglycemia.   c) Try to check blood glucose before sleeping/in the middle of the night to ensure that it is remaining stable and not dropping less than 100 d) Check blood glucose more often if sick  3. Diet: a) 3 meals per day schedule b: Restrict carbs to 60-70 grams (4 servings) per meal c) Colorful vegetables - 3 servings a day, and low sugar fruit 2 servings/day Plate control method: 1/4 plate protein, 1/4 starch, 1/2 green, yellow, or red vegetables d) Avoid carbohydrate snacks unless hypoglycemic episode, or increased physical activity  4. Regular exercise as tolerated, preferably 3 or more hours a week  5. Hypoglycemia: a)  Do not drive or operate machinery without first testing blood glucose to assure it is over 90 mg%, or if dizzy, lightheaded, not feeling normal, etc, or  if foot or leg is numb or weak. b)  If blood glucose less than 70, take four 5gm Glucose tabs or 15-30 gm Glucose gel.  Repeat every 15 min as needed until blood sugar is >100 mg/dl. If hypoglycemia persists then call 911.   6. Sick day management: a) Check blood glucose more often b) Continue usual therapy if blood sugars are elevated.   7. Contact the doctor immediately if blood glucose is frequently <60 mg/dl, or an episode of severe hypoglycemia occurs (where someone had to give you glucose/  glucagon  or if you passed  out from a low blood glucose), or if blood glucose is persistently >350 mg/dl, for further management  8. A change in level of physical activity or exercise and a change in diet may also affect your blood sugar. Check blood sugars more often and call if needed.  Instructions: 1. Bring glucose meter, blood glucose records on every visit for review 2. Continue to follow up with primary care physician and other providers for medical care 3. Yearly eye  and foot exam 4. Please get blood work done prior to the next appointment

## 2023-09-04 NOTE — Progress Notes (Signed)
 Outpatient Endocrinology Note Joseph Birmingham, MD  09/04/23   [name] 30-Apr-1996 [id number]  Referring Provider: Theotis Haze ORN, NP Primary Care Provider: Theotis Haze ORN, NP Reason for consultation: Subjective   Assessment & Plan  Joseph Barr was seen today for follow-up.  Diagnoses and all orders for this visit:  Uncontrolled type 1 diabetes mellitus with hyperglycemia (HCC) -     Insulin  Pen Needle 31G X 5 MM MISC; Use to inject Lantus  twice daily.  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 Increase by 1 unit everyday until 2 hour after meal BG reaches 150 No known contraindications/side effects to any of above medications Glucagon  Baqsimi  discussed and prescribed with refills on 08/21/23    -Last LD and Tg are as follows: Lab Results  Component Value Date   LDLCALC 104 (H) 08/14/2023    Lab Results  Component Value Date   TRIG 157 (H) 08/14/2023   -not on statin  -Follow low fat diet and exercise   -Blood pressure goal <140/90 - Microalbumin/creatinine goal is < 30 -Last MA/Cr is as follows: Lab Results  Component Value Date   MICROALBUR 2.2 08/14/2023   -on ACE/ARB lisinopril  2.5 mg/day  -diet changes including salt restriction -limit eating outside -counseled BP targets per standards of diabetes care -uncontrolled blood pressure can lead to retinopathy, nephropathy and cardiovascular and atherosclerotic heart disease  Reviewed and counseled on: -A1C target -Blood sugar targets -Complications of uncontrolled diabetes  -Checking blood sugar before meals and bedtime and  bring log next visit -All medications with mechanism of action and side effects -Hypoglycemia management: rule of 15's, Glucagon  Emergency Kit and medical alert ID -low-carb low-fat plate-method diet -At least 20 minutes of physical activity per day -Annual dilated retinal eye exam and foot exam -compliance and follow up needs -follow up as scheduled or earlier if problem gets worse  Call if blood sugar is less than 70 or consistently above 250    Take a 15 gm snack of carbohydrate at bedtime before you go to sleep if your blood sugar is less than 100.    If you are going to fast after midnight for a test or procedure, ask your physician for instructions on how to reduce/decrease your insulin  dose.    Call if blood sugar is less than 70 or consistently above 250  -Treating a low sugar by rule of 15  (15 gms of sugar every 15 min until sugar is more than 70) If you feel your sugar is low, test your sugar to be sure If your sugar is low (less than 70), then take 15 grams of a fast acting Carbohydrate (3-4 glucose tablets or glucose gel or 4 ounces of juice or regular soda) Recheck your sugar 15 min after treating low to make sure it is more than 70 If sugar is still less than 70, treat again with 15 grams of carbohydrate          Don't drive the hour of hypoglycemia  If unconscious/unable to eat or drink by mouth, use glucagon  injection or nasal spray baqsimi   and call 911. Can repeat again in 15 min if still unconscious.  Return in about 2 weeks (around 09/18/2023) for televisit: jan 17 - 8:40 am.   I have reviewed current medications, nurse's notes, allergies, vital signs, past medical and surgical history, family medical history, and social history for this encounter. Counseled patient on symptoms, examination findings, lab findings, imaging results, treatment decisions and monitoring and prognosis. The patient understood the recommendations and agrees with the treatment plan. All  questions regarding treatment plan were fully answered.  Joseph Birmingham, MD  09/04/23    History of Present Illness Joseph Barr is a 28 y.o. year old male who presents for follow up of Type I diabetes mellitus.  Joseph Barr was first diagnosed at age 28.  Diabetes education +  Home diabetes regimen: Lantus  18 units bid  Humalog  4 units + SS 1:25>150 (usually takes 8-12 units/day)  COMPLICATIONS -  MI/Stroke -  retinopathy +  neuropathy -  nephropathy  BLOOD SUGAR DATA  CGM interpretation: At today's visit, we reviewed her CGM downloads. The full report is scanned in the media. Reviewing the CGM trends, BG are elevated all across the day.   Physical Exam  BP 118/72 (BP Location: Left Arm, Patient Position: Sitting, Cuff Size: Normal)   Pulse 100   SpO2 95%    Constitutional: well developed, well nourished Head: normocephalic, atraumatic Eyes: sclera anicteric, no redness Neck: supple Lungs: normal respiratory effort Neurology: alert and oriented Skin: dry, no appreciable rashes Musculoskeletal: no appreciable defects Psychiatric: normal mood and affect Diabetic Foot Exam - Simple   No data filed      Current Medications Patient's Medications  New Prescriptions   No medications on file  Previous Medications   CONTINUOUS GLUCOSE SENSOR (DEXCOM G7 SENSOR) MISC    Use to monitor blood glucose. Change sensor every 10 days.   CONTINUOUS GLUCOSE TRANSMITTER (DEXCOM G6 TRANSMITTER) MISC    Check blood glucose levels 6 times per day E11.65   GABAPENTIN  (NEURONTIN ) 100 MG CAPSULE    Take 1 capsule (100 mg total) by mouth 3 (three) times daily.   GLUCAGON  (BAQSIMI  ONE PACK) 3 MG/DOSE POWD    Place 1 Device into the nose as needed (Low blood sugar with impaired consciousness).   INSULIN  GLARGINE (LANTUS ) 100 UNIT/ML SOLOSTAR PEN    Inject 18 Units into the skin 2 (two) times daily.   INSULIN  LISPRO (HUMALOG  KWIKPEN) 100 UNIT/ML KWIKPEN    Inject 5 Units into the skin 3  (three) times daily after meals. For blood sugars 0-150 give 0 units of insulin , 151-200 give 2 units of insulin , 201-250 give 4 units, 251-300 give 6 units, 301-350 give 8 units, 351-400 give 10 units,> 400 give 12 units and call M.D. Discussed hypoglycemia protocol.   INSULIN  SYRINGE-NEEDLE U-100 31G X 15/64 0.3 ML MISC    Use to inject Humalog   3 times a day.  Keep upcoming appt for additional refills.   LISINOPRIL  (ZESTRIL ) 2.5 MG TABLET    Take 1 tablet (2.5 mg total) by mouth daily. To help protect kidneys   ONDANSETRON  (ZOFRAN ) 4 MG TABLET    Take 1 tablet (4 mg total) by mouth every 6 (six) hours as needed for nausea.   PANTOPRAZOLE  (PROTONIX ) 40 MG TABLET    Take 1 tablet (40 mg total) by mouth 2 (two) times daily.   TRUEPLUS LANCETS 28G MISC    use as directed to test blood sugars four times a day  Modified Medications   Modified Medication Previous Medication   INSULIN  PEN NEEDLE 31G X 5 MM MISC Insulin  Pen Needle 31G X 5 MM MISC      Use to inject Lantus  twice daily.    Use to inject Lantus  twice daily.  Discontinued Medications   ACCU-CHEK SOFTCLIX LANCETS LANCETS    Use up to four times daily as directed.   ACETAMINOPHEN  (TYLENOL ) 500 MG TABLET    Take 500 mg by mouth every 6 (six) hours as needed for headache.   BLOOD GLUCOSE METER KIT AND SUPPLIES    Use up to four times daily as directed. (FOR ICD-10 E10.9, E11.9).   BLOOD GLUCOSE MONITORING SUPPL (TRUE METRIX METER) W/DEVICE KIT    Use as directed   CONTINUOUS GLUCOSE RECEIVER (DEXCOM G6 RECEIVER) DEVI    Check blood glucose levels 6 times per day E11.65   GLUCOSE BLOOD (TRUE METRIX BLOOD GLUCOSE TEST) TEST STRIP    Use as instructed   GLUCOSE BLOOD TEST STRIP    use as directed to test blood sugars four times a day   GLUCOSE BLOOD TEST STRIP    Use up to four times daily as directed.   LIDOCAINE (XYLOCAINE) 2 % SOLUTION    SMARTSIG:15 Milliliter(s) By Mouth Every 4 Hours PRN    Allergies No Known Allergies  Past Medical  History Past Medical History:  Diagnosis Date   Diabetes mellitus     Past Surgical History History reviewed. No pertinent surgical history.  Family History family history includes Cancer in his maternal grandfather; Hypertension in his mother.  Social History Social History   Socioeconomic History   Marital status: Single    Spouse name: Not on file   Number of children: Not on file   Years of education: Not on file   Highest education level: Associate degree: academic program  Occupational History   Not on file  Tobacco Use   Smoking status: Former    Types: E-cigarettes   Smokeless tobacco: Former  Building Services Engineer status: Former  Substance and Sexual Activity   Alcohol use: No   Drug use: Not Currently    Types: Marijuana   Sexual activity: Not Currently  Other Topics Concern   Not on file  Social History Narrative   Not on file   Social Drivers of Health   Financial Resource Strain: Medium Risk (04/10/2023)   Overall Financial Resource Strain (CARDIA)    Difficulty of Paying Living Expenses: Somewhat hard  Food Insecurity: No Food Insecurity (04/10/2023)   Hunger Vital Sign    Worried About Running Out of Food in the Last Year: Never true    Ran Out of Food in the Last Year: Never true  Transportation Needs: No Transportation Needs (04/10/2023)   PRAPARE - Administrator, Civil Service (Medical): No    Lack of Transportation (Non-Medical): No  Physical Activity: Sufficiently Active (04/10/2023)   Exercise Vital Sign    Days of Exercise per Week: 7 days    Minutes of Exercise per Session: 150+ min  Stress: No Stress Concern Present (04/10/2023)   [employer] of Occupational Health - Occupational Stress Questionnaire    Feeling of Stress : Not at all  Social Connections: Moderately Isolated (04/10/2023)   Social Connection and Isolation Panel [NHANES]    Frequency of Communication with Friends and Family: More than three times a week     Frequency of Social Gatherings with Friends and Family: More than  three times a week    Attends Religious Services: 1 to 4 times per year    Active Member of Clubs or Organizations: No    Attends Banker Meetings: Not on file    Marital Status: Never married  Intimate Partner Violence: Not At Risk (03/08/2023)   Humiliation, Afraid, Rape, and Kick questionnaire    Fear of Current or Ex-Partner: No    Emotionally Abused: No    Physically Abused: No    Sexually Abused: No    Lab Results  Component Value Date   HGBA1C 11.1 (H) 08/14/2023   HGBA1C >15.5 (H) 03/09/2023   HGBA1C >15.5 (H) 01/16/2023   Lab Results  Component Value Date   CHOL 187 08/14/2023   Lab Results  Component Value Date   HDL 58 08/14/2023   Lab Results  Component Value Date   LDLCALC 104 (H) 08/14/2023   Lab Results  Component Value Date   TRIG 157 (H) 08/14/2023   Lab Results  Component Value Date   CHOLHDL 3.2 08/14/2023   Lab Results  Component Value Date   CREATININE 1.12 08/14/2023   No results found for: GFR Lab Results  Component Value Date   MICROALBUR 2.2 08/14/2023      Component Value Date/Time   NA 135 08/14/2023 1056   NA 138 04/10/2023 1433   K 5.0 08/14/2023 1056   CL 96 (L) 08/14/2023 1056   CO2 30 08/14/2023 1056   GLUCOSE 486 (H) 08/14/2023 1056   BUN 14 08/14/2023 1056   BUN 14 04/10/2023 1433   CREATININE 1.12 08/14/2023 1056   CALCIUM  9.6 08/14/2023 1056   PROT 7.1 08/14/2023 1056   PROT 7.1 04/10/2023 1433   ALBUMIN 4.5 04/10/2023 1433   AST 14 08/14/2023 1056   ALT 11 08/14/2023 1056   ALKPHOS 61 04/10/2023 1433   BILITOT 0.5 08/14/2023 1056   BILITOT 0.3 04/10/2023 1433   GFRNONAA >60 03/09/2023 0313   GFRNONAA >89 07/03/2014 1038   GFRAA 141 12/01/2019 1113   GFRAA >89 07/03/2014 1038      Latest Ref Rng & Units 08/14/2023   10:56 AM 04/10/2023    2:33 PM 03/09/2023    3:13 AM  BMP  Glucose 65 - 99 mg/dL 513  762  840   BUN 7 - 25 mg/dL  14  14  9    Creatinine 0.60 - 1.24 mg/dL 8.87  9.09  9.26   BUN/Creat Ratio 6 - 22 (calc) SEE NOTE:  16    Sodium 135 - 146 mmol/L 135  138  135   Potassium 3.5 - 5.3 mmol/L 5.0  4.5  3.3   Chloride 98 - 110 mmol/L 96  99  100   CO2 20 - 32 mmol/L 30  26  27    Calcium  8.6 - 10.3 mg/dL 9.6  9.5  8.6        Component Value Date/Time   WBC 5.2 04/10/2023 1433   WBC 10.4 03/09/2023 0313   RBC 4.47 04/10/2023 1433   RBC 3.82 (L) 03/09/2023 0313   HGB 13.5 04/10/2023 1433   HCT 39.1 04/10/2023 1433   PLT 338 04/10/2023 1433   MCV 88 04/10/2023 1433   MCH 30.2 04/10/2023 1433   MCH 29.6 03/09/2023 0313   MCHC 34.5 04/10/2023 1433   MCHC 34.1 03/09/2023 0313   RDW 12.9 04/10/2023 1433   LYMPHSABS 1.8 04/10/2023 1433   MONOABS 1.8 (H) 03/08/2023 0016   EOSABS 0.2 04/10/2023 1433  BASOSABS 0.1 04/10/2023 1433     Parts of this note may have been dictated using voice recognition software. There may be variances in spelling and vocabulary which are unintentional. Not all errors are proofread. Please notify the dino if any discrepancies are noted or if the meaning of any statement is not clear.

## 2023-09-09 ENCOUNTER — Other Ambulatory Visit: Payer: Self-pay

## 2023-09-15 ENCOUNTER — Other Ambulatory Visit: Payer: Self-pay

## 2023-09-16 ENCOUNTER — Other Ambulatory Visit: Payer: Self-pay

## 2023-09-18 ENCOUNTER — Encounter: Payer: Self-pay | Admitting: "Endocrinology

## 2023-09-18 ENCOUNTER — Telehealth (INDEPENDENT_AMBULATORY_CARE_PROVIDER_SITE_OTHER): Payer: Self-pay | Admitting: "Endocrinology

## 2023-09-18 ENCOUNTER — Other Ambulatory Visit: Payer: Self-pay

## 2023-09-18 DIAGNOSIS — E1065 Type 1 diabetes mellitus with hyperglycemia: Secondary | ICD-10-CM

## 2023-09-18 DIAGNOSIS — E7800 Pure hypercholesterolemia, unspecified: Secondary | ICD-10-CM

## 2023-09-18 NOTE — Progress Notes (Signed)
Outpatient Endocrinology Note Altamese Odell, MD  09/18/23   Joseph Barr [birth year] [id number]  Referring Provider: Claiborne Rigg, NP Primary Care Provider: Claiborne Rigg, NP Reason for consultation: Subjective   Assessment & Plan  Joseph Barr was seen today for diabetes.  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, No results found for: "GFR" C-peptide -ve, DM antibodies + Hba1c goal less than 7, current Hba1c is  Lab Results  Component Value Date   HGBA1C 11.1 (H) 08/14/2023   Will recommend the following:   Interested in pump, ordered DM education Lantus 18 units qam, 16 units at night  Humalog 5 units with break fast, 7 units before lunch, and 9 units before dinner 15 min before meals Correction scale: Use in addition to your meal time/short acting insulin based on blood sugars as follows:  151 - 200: 1 unit 201 - 250: 2 units 251 - 300: 3 units 301 - 350: 4 units 351 - 400: 5 units  Takes gabapentin prn No known contraindications/side effects to any of above medications Glucagon Baqsimi discussed and prescribed with refills on 08/21/23    -Last LD and Tg are as follows: Lab Results  Component Value Date   LDLCALC 104 (H) 08/14/2023    Lab Results  Component Value Date   TRIG 157 (H) 08/14/2023   -not on statin  -Follow low fat diet and exercise   -Blood pressure goal <140/90 - Microalbumin/creatinine goal is < 30 -Last MA/Cr is as follows: Lab Results  Component Value Date   MICROALBUR 2.2 08/14/2023   -on ACE/ARB lisinopril 2.5 mg/day  -diet changes including salt restriction -limit eating outside -counseled BP targets per standards of diabetes care -uncontrolled blood pressure can lead to retinopathy, nephropathy and cardiovascular and atherosclerotic heart disease  Reviewed and counseled  on: -A1C target -Blood sugar targets -Complications of uncontrolled diabetes  -Checking blood sugar before meals and bedtime and bring log next visit -All medications with mechanism of action and side effects -Hypoglycemia management: rule of 15's, Glucagon Emergency Kit and medical alert ID -low-carb low-fat plate-method diet -At least 20 minutes of physical activity per day -Annual dilated retinal eye exam and foot exam -compliance and follow up needs -follow up as scheduled or earlier if problem gets worse  Call if blood sugar is less than 70 or consistently above 250    Take a 15 gm snack of carbohydrate at bedtime before you go to sleep if your blood sugar is less than 100.    If you are going to fast after midnight for a test or procedure, ask your physician for instructions on how to reduce/decrease your insulin dose.    Call if blood sugar is less than 70 or consistently above 250  -Treating a low sugar by rule of 15  (15 gms of sugar every 15 min until sugar is more than 70) If you feel your sugar is low, test your sugar to be sure If your sugar is low (less than 70), then take 15 grams of a fast acting Carbohydrate (3-4 glucose tablets or glucose gel or 4 ounces of juice or regular soda) Recheck your sugar 15 min after treating low to make sure it is more than 70 If sugar is still less than 70, treat again with 15 grams of carbohydrate  Don't drive the hour of hypoglycemia  If unconscious/unable to eat or drink by mouth, use glucagon injection or nasal spray baqsimi and call 911. Can repeat again in 15 min if still unconscious.  Return in about 17 days (around 10/05/2023).   I have reviewed current medications, nurse's notes, allergies, vital signs, past medical and surgical history, family medical history, and social history for this encounter. Counseled patient on symptoms, examination findings, lab findings, imaging results, treatment decisions and monitoring and  prognosis. The patient understood the recommendations and agrees with the treatment plan. All questions regarding treatment plan were fully answered.  Altamese Maineville, MD  09/18/23    History of Present Illness Joseph Barr is a 28 y.o. year old male who presents for follow up of Type I diabetes mellitus.  Joseph Barr was first diagnosed at age 28.  Diabetes education +  Home diabetes regimen: Lantus 18 units bid  Humalog 4 units with break fast, 6 units before lunch, and 8 units before dinner + SS 1:100>150   COMPLICATIONS -  MI/Stroke -  retinopathy +  neuropathy -  nephropathy  BLOOD SUGAR DATA  CGM interpretation: At today's visit, we reviewed her CGM downloads. The full report is scanned in the media. Reviewing the CGM trends, BG are elevated all across the day.   Physical Exam  There were no vitals taken for this visit.   Constitutional: well developed, well nourished Head: normocephalic, atraumatic Eyes: sclera anicteric, no redness Neck: supple Lungs: normal respiratory effort Neurology: alert and oriented Skin: dry, no appreciable rashes Musculoskeletal: no appreciable defects Psychiatric: normal mood and affect Diabetic Foot Exam - Simple   No data filed      Current Medications Patient's Medications  New Prescriptions   No medications on file  Previous Medications   CONTINUOUS GLUCOSE SENSOR (DEXCOM G7 SENSOR) MISC    Use to monitor blood glucose. Change sensor every 10 days.   CONTINUOUS GLUCOSE TRANSMITTER (DEXCOM G6 TRANSMITTER) MISC    Check blood glucose levels 6 times per day E11.65   GABAPENTIN (NEURONTIN) 100 MG CAPSULE    Take 1 capsule (100 mg total) by mouth 3 (three) times daily.   GLUCAGON (BAQSIMI ONE PACK) 3 MG/DOSE POWD    Place 1 Device into the nose as needed (Low blood sugar with impaired consciousness).   INSULIN GLARGINE (LANTUS) 100 UNIT/ML SOLOSTAR PEN    Inject 18 Units into the skin 2 (two) times daily.   INSULIN LISPRO  (HUMALOG KWIKPEN) 100 UNIT/ML KWIKPEN    Inject 5 Units into the skin 3 (three) times daily after meals. For blood sugars 0-150 give 0 units of insulin, 151-200 give 2 units of insulin, 201-250 give 4 units, 251-300 give 6 units, 301-350 give 8 units, 351-400 give 10 units,> 400 give 12 units and call M.D. Discussed hypoglycemia protocol.   INSULIN PEN NEEDLE 31G X 5 MM MISC    Use to inject Lantus twice daily.   LISINOPRIL (ZESTRIL) 2.5 MG TABLET    Take 1 tablet (2.5 mg total) by mouth daily. To help protect kidneys  Modified Medications   No medications on file  Discontinued Medications   INSULIN SYRINGE-NEEDLE U-100 31G X 15/64" 0.3 ML MISC    Use to inject Humalog  3 times a day.  Keep upcoming appt for additional refills.   ONDANSETRON (ZOFRAN) 4 MG TABLET    Take 1 tablet (4 mg total) by mouth every 6 (six) hours as needed for nausea.  PANTOPRAZOLE (PROTONIX) 40 MG TABLET    Take 1 tablet (40 mg total) by mouth 2 (two) times daily.   TRUEPLUS LANCETS 28G MISC    use as directed to test blood sugars four times a day    Allergies No Known Allergies  Past Medical History Past Medical History:  Diagnosis Date   Diabetes mellitus     Past Surgical History History reviewed. No pertinent surgical history.  Family History family history includes Cancer in his maternal grandfather; Hypertension in his mother.  Social History Social History   Socioeconomic History   Marital status: Single    Spouse name: Not on file   Number of children: Not on file   Years of education: Not on file   Highest education level: Associate degree: academic program  Occupational History   Not on file  Tobacco Use   Smoking status: Former    Types: E-cigarettes   Smokeless tobacco: Former  Building services engineer status: Former  Substance and Sexual Activity   Alcohol use: No   Drug use: Not Currently    Types: Marijuana   Sexual activity: Not Currently  Other Topics Concern   Not on file   Social History Narrative   Not on file   Social Drivers of Health   Financial Resource Strain: Medium Risk (04/10/2023)   Overall Financial Resource Strain (CARDIA)    Difficulty of Paying Living Expenses: Somewhat hard  Food Insecurity: No Food Insecurity (04/10/2023)   Hunger Vital Sign    Worried About Running Out of Food in the Last Year: Never true    Ran Out of Food in the Last Year: Never true  Transportation Needs: No Transportation Needs (04/10/2023)   PRAPARE - Administrator, Civil Service (Medical): No    Lack of Transportation (Non-Medical): No  Physical Activity: Sufficiently Active (04/10/2023)   Exercise Vital Sign    Days of Exercise per Week: 7 days    Minutes of Exercise per Session: 150+ min  Stress: No Stress Concern Present (04/10/2023)   [employer] of Occupational Health - Occupational Stress Questionnaire    Feeling of Stress : Not at all  Social Connections: Moderately Isolated (04/10/2023)   Social Connection and Isolation Panel [NHANES]    Frequency of Communication with Friends and Family: More than three times a week    Frequency of Social Gatherings with Friends and Family: More than three times a week    Attends Religious Services: 1 to 4 times per year    Active Member of [employer] or Organizations: No    Attends Banker Meetings: Not on file    Marital Status: Never married  Intimate Partner Violence: Not At Risk (03/08/2023)   Humiliation, Afraid, Rape, and Kick questionnaire    Fear of Current or Ex-Partner: No    Emotionally Abused: No    Physically Abused: No    Sexually Abused: No    Lab Results  Component Value Date   HGBA1C 11.1 (H) 08/14/2023   HGBA1C >15.5 (H) 03/09/2023   HGBA1C >15.5 (H) 01/16/2023   Lab Results  Component Value Date   CHOL 187 08/14/2023   Lab Results  Component Value Date   HDL 58 08/14/2023   Lab Results  Component Value Date   LDLCALC 104 (H) 08/14/2023   Lab Results  Component  Value Date   TRIG 157 (H) 08/14/2023   Lab Results  Component Value Date   CHOLHDL 3.2 08/14/2023  Lab Results  Component Value Date   CREATININE 1.12 08/14/2023   No results found for: "GFR" Lab Results  Component Value Date   MICROALBUR 2.2 08/14/2023      Component Value Date/Time   NA 135 08/14/2023 1056   NA 138 04/10/2023 1433   K 5.0 08/14/2023 1056   CL 96 (L) 08/14/2023 1056   CO2 30 08/14/2023 1056   GLUCOSE 486 (H) 08/14/2023 1056   BUN 14 08/14/2023 1056   BUN 14 04/10/2023 1433   CREATININE 1.12 08/14/2023 1056   CALCIUM 9.6 08/14/2023 1056   PROT 7.1 08/14/2023 1056   PROT 7.1 04/10/2023 1433   ALBUMIN 4.5 04/10/2023 1433   AST 14 08/14/2023 1056   ALT 11 08/14/2023 1056   ALKPHOS 61 04/10/2023 1433   BILITOT 0.5 08/14/2023 1056   BILITOT 0.3 04/10/2023 1433   GFRNONAA >60 03/09/2023 0313   GFRNONAA >89 07/03/2014 1038   GFRAA 141 12/01/2019 1113   GFRAA >89 07/03/2014 1038      Latest Ref Rng & Units 08/14/2023   10:56 AM 04/10/2023    2:33 PM 03/09/2023    3:13 AM  BMP  Glucose 65 - 99 mg/dL 660  630  160   BUN 7 - 25 mg/dL 14  14  9    Creatinine 0.60 - 1.24 mg/dL 1.09  3.23  5.57   BUN/Creat Ratio 6 - 22 (calc) SEE NOTE:  16    Sodium 135 - 146 mmol/L 135  138  135   Potassium 3.5 - 5.3 mmol/L 5.0  4.5  3.3   Chloride 98 - 110 mmol/L 96  99  100   CO2 20 - 32 mmol/L 30  26  27    Calcium 8.6 - 10.3 mg/dL 9.6  9.5  8.6        Component Value Date/Time   WBC 5.2 04/10/2023 1433   WBC 10.4 03/09/2023 0313   RBC 4.47 04/10/2023 1433   RBC 3.82 (L) 03/09/2023 0313   HGB 13.5 04/10/2023 1433   HCT 39.1 04/10/2023 1433   PLT 338 04/10/2023 1433   MCV 88 04/10/2023 1433   MCH 30.2 04/10/2023 1433   MCH 29.6 03/09/2023 0313   MCHC 34.5 04/10/2023 1433   MCHC 34.1 03/09/2023 0313   RDW 12.9 04/10/2023 1433   LYMPHSABS 1.8 04/10/2023 1433   MONOABS 1.8 (H) 03/08/2023 0016   EOSABS 0.2 04/10/2023 1433   BASOSABS 0.1 04/10/2023 1433      Parts of this note may have been dictated using voice recognition software. There may be variances in spelling and vocabulary which are unintentional. Not all errors are proofread. Please notify the Thereasa Parkin if any discrepancies are noted or if the meaning of any statement is not clear.

## 2023-09-18 NOTE — Patient Instructions (Addendum)
Lantus 18 units qam, 16 units at night  Humalog 5 units with break fast, 7 units before lunch, and 9 units before dinner  Correction scale: Use in addition to your meal time/Humalog insulin based on blood sugars as follows:  151 - 200: 1 unit 201 - 250: 2 units 251 - 300: 3 units 301 - 350: 4 units 351 - 400: 5 units

## 2023-10-06 IMAGING — CT CT ANGIO CHEST
3 of 6 series · 14 of 36 positions shown · IV contrast (omnipaque)
Comparison: November 19, 2019.

CLINICAL DATA: Productive cough.  Sinus tachycardia.

EXAM:
CT ANGIOGRAPHY CHEST WITH CONTRAST
TECHNIQUE: Multidetector CT imaging of the chest was performed using the
standard protocol during bolus administration of intravenous
contrast. Multiplanar CT image reconstructions and MIPs were
obtained to evaluate the vascular anatomy.
CONTRAST:  75mL OMNIPAQUE IOHEXOL 350 MG/ML SOLN

[Series 5: pe 2mm · axial · 0.70mm/px · z∈[+1109,+1309]mm · 5 of 152 slices shown]
[im 26/152  lung]
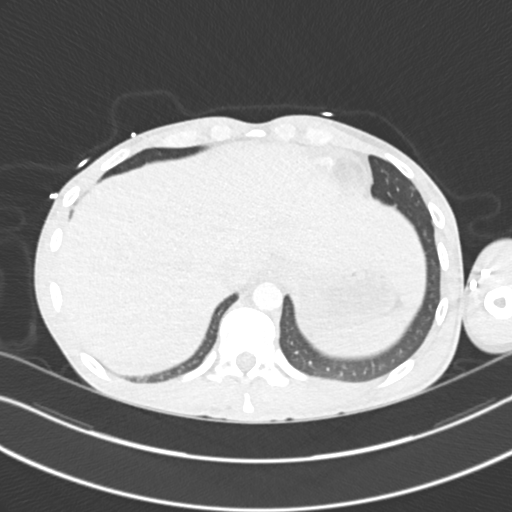
[im 51/152  mediastinal]
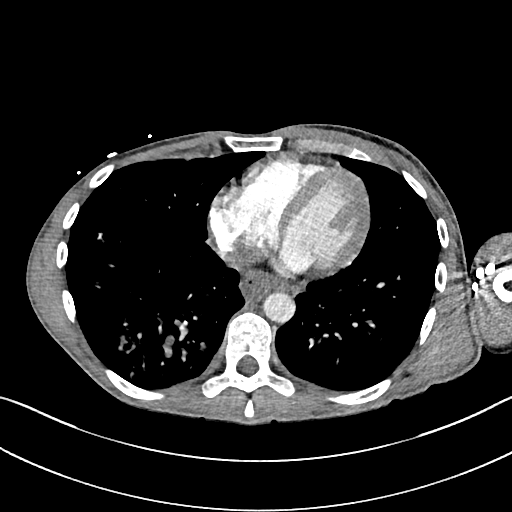
[im 76/152  lung]
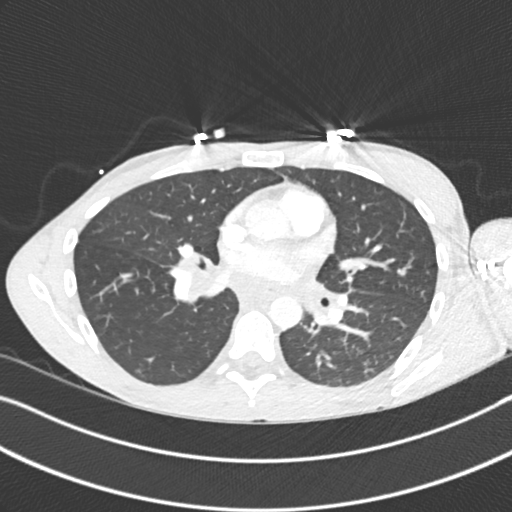
[im 101/152  mediastinal]
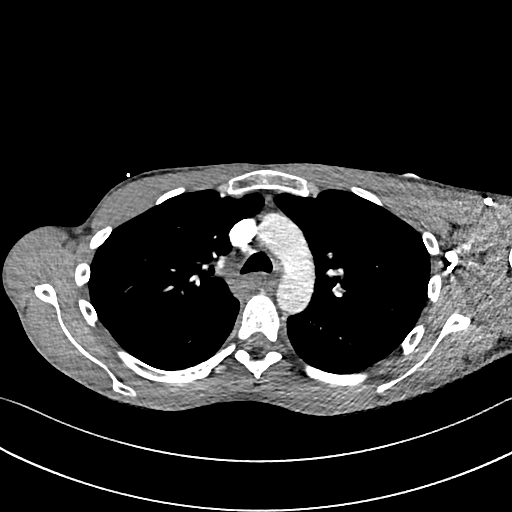
[im 126/152  lung]
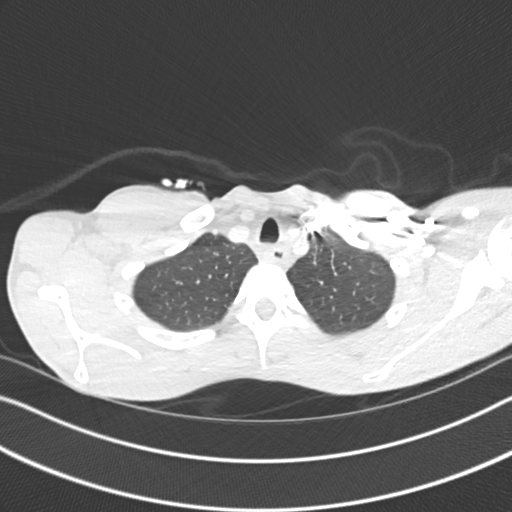

[Series 7: pe thins · axial · 0.70mm/px · z∈[+1093,+1331]mm · 8 of 426 slices shown]
[im 43/426  lung]
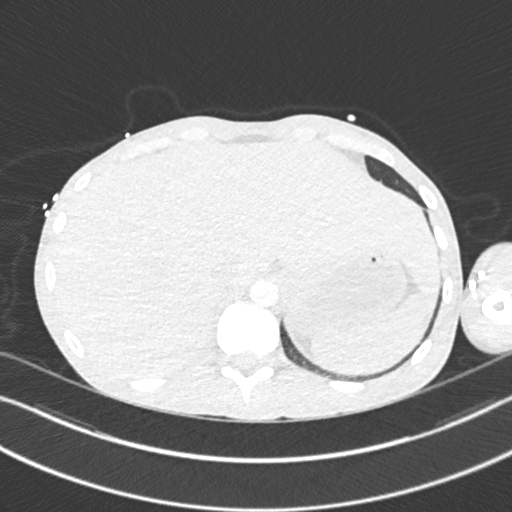
[im 86/426  lung]
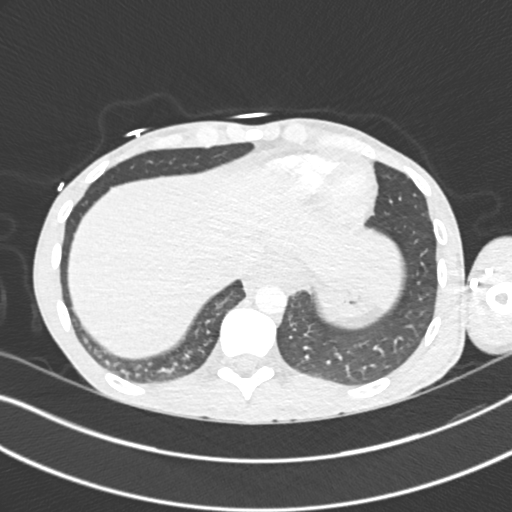
[im 128/426  lung]
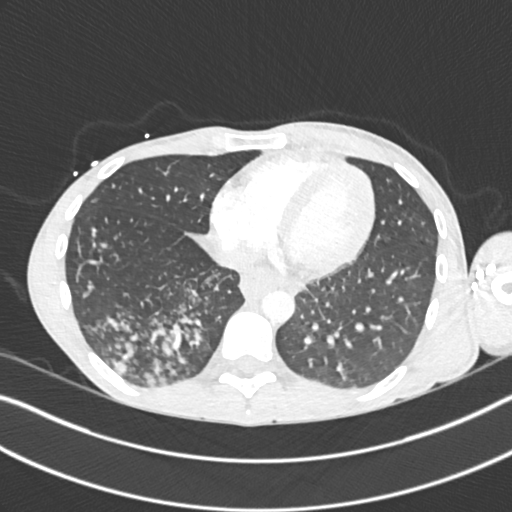
[im 192/426  lung]
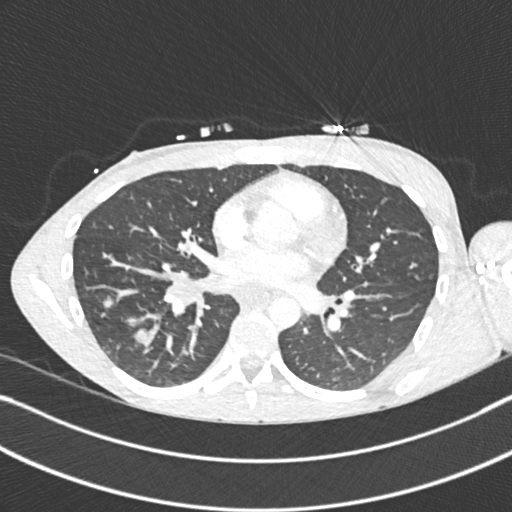
[im 234/426  lung]
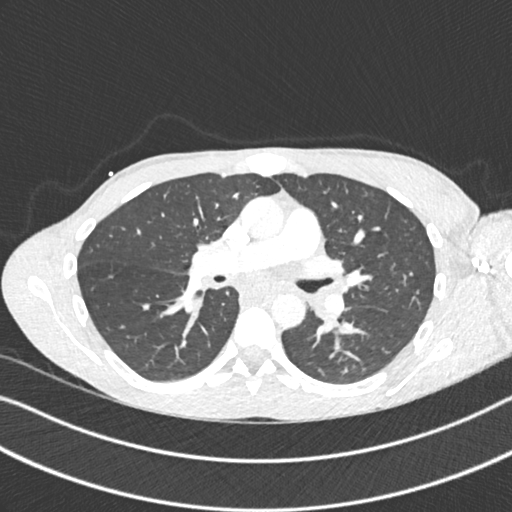
[im 298/426  lung]
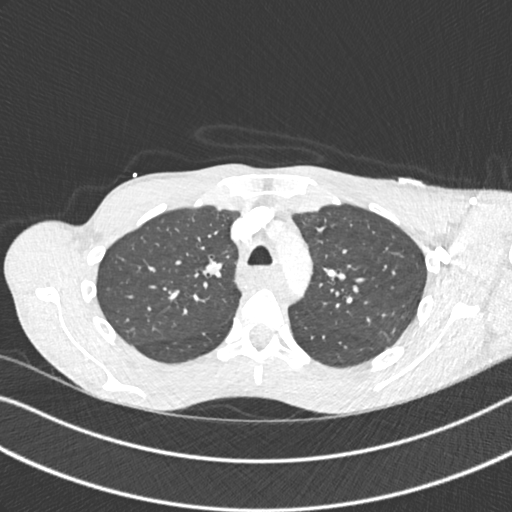
[im 341/426  lung]
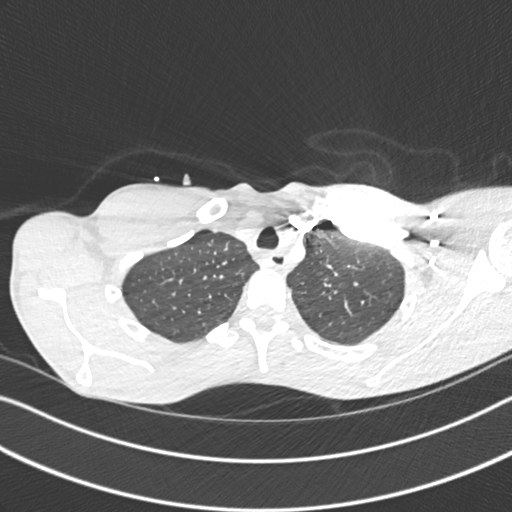
[im 383/426  lung]
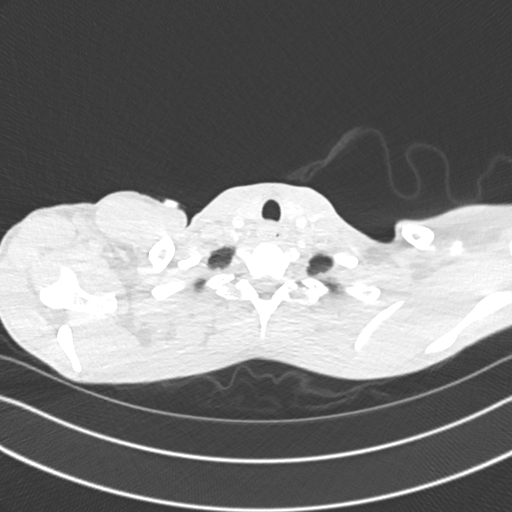

[Series 8: pe 2mm cor · coronal · 0.60mm/px · 1 of 146 slices shown]
[im 73/146  mediastinal]
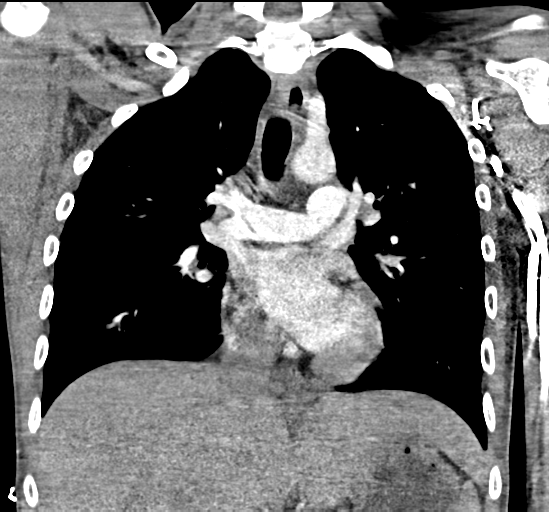

[14 of 36 positions shown; findings below may reference images not displayed]

FINDINGS: Cardiovascular: Satisfactory opacification of the pulmonary arteries
to the segmental level. No evidence of pulmonary embolism. Normal
heart size. No pericardial effusion.

Mediastinum/Nodes: Thyroid gland is unremarkable. No adenopathy is
noted. Severe wall thickening of the esophagus is noted concerning
for esophagitis. Endoscopy is recommended.

Lungs/Pleura: No pneumothorax or pleural effusion is noted. Multiple
ill-defined airspace opacities are noted in the right lower lobe
consistent with pneumonia. There appears to be abnormal soft tissue
density within the right lower lobe bronchi concerning for
aspiration or mucous plugging.

Upper Abdomen: No acute abnormality.

Musculoskeletal: No chest wall abnormality. No acute or significant
osseous findings.

Review of the MIP images confirms the above findings.
IMPRESSION: No definite evidence of pulmonary embolus.

Multiple ill-defined airspace opacities are noted in the right lower
lobe consistent with large pneumonia. Abnormal soft tissue density
is seen within the right lower lobe bronchi concerning for
aspiration or mucous plugging.

Severe diffuse esophageal wall thickening is noted concerning for
esophagitis. Endoscopy is recommended for further evaluation.

## 2023-10-09 ENCOUNTER — Other Ambulatory Visit: Payer: Self-pay

## 2023-10-14 ENCOUNTER — Other Ambulatory Visit: Payer: Self-pay

## 2023-10-19 ENCOUNTER — Other Ambulatory Visit: Payer: Self-pay

## 2023-10-22 ENCOUNTER — Other Ambulatory Visit: Payer: Self-pay

## 2023-10-23 ENCOUNTER — Other Ambulatory Visit (HOSPITAL_COMMUNITY): Payer: Self-pay

## 2023-10-23 ENCOUNTER — Other Ambulatory Visit: Payer: Self-pay

## 2023-10-23 DIAGNOSIS — E1065 Type 1 diabetes mellitus with hyperglycemia: Secondary | ICD-10-CM

## 2023-10-23 MED ORDER — INSULIN PEN NEEDLE 31G X 5 MM MISC
11 refills | Status: AC
  Filled 2023-10-23: qty 200, 28d supply, fill #0
  Filled 2023-10-23: qty 200, fill #0
  Filled 2023-11-13: qty 200, 40d supply, fill #0
  Filled 2023-11-16: qty 200, 30d supply, fill #0
  Filled 2023-11-19: qty 200, 25d supply, fill #0
  Filled 2023-12-15 – 2024-01-12 (×2): qty 200, 25d supply, fill #1
  Filled 2024-02-05: qty 200, 25d supply, fill #2
  Filled 2024-03-07: qty 200, 25d supply, fill #3
  Filled 2024-05-01: qty 200, 25d supply, fill #4
  Filled 2024-07-06: qty 200, 25d supply, fill #5
  Filled 2024-08-02: qty 200, 25d supply, fill #6
  Filled 2024-08-26 – 2024-09-22 (×3): qty 200, 25d supply, fill #7

## 2023-11-13 ENCOUNTER — Other Ambulatory Visit: Payer: Self-pay

## 2023-11-16 ENCOUNTER — Other Ambulatory Visit: Payer: Self-pay

## 2023-11-19 ENCOUNTER — Other Ambulatory Visit: Payer: Self-pay

## 2023-11-20 ENCOUNTER — Other Ambulatory Visit: Payer: Self-pay

## 2023-11-23 ENCOUNTER — Other Ambulatory Visit: Payer: Self-pay

## 2023-12-15 ENCOUNTER — Other Ambulatory Visit: Payer: Self-pay

## 2023-12-16 ENCOUNTER — Other Ambulatory Visit: Payer: Self-pay

## 2023-12-24 ENCOUNTER — Other Ambulatory Visit: Payer: Self-pay

## 2024-01-12 ENCOUNTER — Other Ambulatory Visit: Payer: Self-pay

## 2024-01-13 ENCOUNTER — Other Ambulatory Visit: Payer: Self-pay

## 2024-01-15 ENCOUNTER — Other Ambulatory Visit: Payer: Self-pay

## 2024-02-05 ENCOUNTER — Other Ambulatory Visit: Payer: Self-pay

## 2024-02-05 ENCOUNTER — Other Ambulatory Visit: Payer: Self-pay | Admitting: "Endocrinology

## 2024-02-05 MED ORDER — DEXCOM G7 SENSOR MISC
1.0000 | 1 refills | Status: DC
  Filled 2024-02-05 – 2024-02-10 (×2): qty 3, 30d supply, fill #0
  Filled 2024-03-01 – 2024-03-07 (×2): qty 3, 30d supply, fill #1
  Filled 2024-04-07: qty 3, 30d supply, fill #2
  Filled 2024-05-01 – 2024-05-09 (×2): qty 3, 30d supply, fill #3
  Filled 2024-05-30 – 2024-06-01 (×2): qty 3, 30d supply, fill #4
  Filled ????-??-??: fill #4

## 2024-02-05 NOTE — Telephone Encounter (Signed)
 Requested Prescriptions   Pending Prescriptions Disp Refills   Continuous Glucose Sensor (DEXCOM G7 SENSOR) MISC 9 each 1    Sig: Use to monitor blood glucose. Change sensor every 10 days.

## 2024-02-08 ENCOUNTER — Other Ambulatory Visit: Payer: Self-pay

## 2024-02-09 ENCOUNTER — Other Ambulatory Visit: Payer: Self-pay

## 2024-02-10 ENCOUNTER — Other Ambulatory Visit: Payer: Self-pay

## 2024-02-11 ENCOUNTER — Other Ambulatory Visit: Payer: Self-pay

## 2024-03-01 ENCOUNTER — Other Ambulatory Visit: Payer: Self-pay

## 2024-03-07 ENCOUNTER — Other Ambulatory Visit: Payer: Self-pay

## 2024-03-08 ENCOUNTER — Other Ambulatory Visit: Payer: Self-pay

## 2024-03-09 ENCOUNTER — Other Ambulatory Visit: Payer: Self-pay

## 2024-04-08 ENCOUNTER — Other Ambulatory Visit: Payer: Self-pay

## 2024-05-02 ENCOUNTER — Other Ambulatory Visit: Payer: Self-pay

## 2024-05-04 ENCOUNTER — Ambulatory Visit (INDEPENDENT_AMBULATORY_CARE_PROVIDER_SITE_OTHER): Payer: Self-pay

## 2024-05-04 ENCOUNTER — Other Ambulatory Visit: Payer: Self-pay

## 2024-05-04 ENCOUNTER — Ambulatory Visit
Admission: EM | Admit: 2024-05-04 | Discharge: 2024-05-04 | Disposition: A | Discharge status: Home / Self Care | Payer: Self-pay | Attending: Family Medicine | Admitting: Family Medicine

## 2024-05-04 DIAGNOSIS — R051 Acute cough: Secondary | ICD-10-CM

## 2024-05-04 DIAGNOSIS — E1065 Type 1 diabetes mellitus with hyperglycemia: Secondary | ICD-10-CM

## 2024-05-04 DIAGNOSIS — J209 Acute bronchitis, unspecified: Secondary | ICD-10-CM

## 2024-05-04 DIAGNOSIS — R0789 Other chest pain: Secondary | ICD-10-CM

## 2024-05-04 LAB — POC COVID19/FLU A&B COMBO
Covid Antigen, POC: NEGATIVE
Influenza A Antigen, POC: NEGATIVE
Influenza B Antigen, POC: NEGATIVE

## 2024-05-04 LAB — POCT RAPID STREP A (OFFICE): Rapid Strep A Screen: NEGATIVE

## 2024-05-04 MED ORDER — PROMETHAZINE-DM 6.25-15 MG/5ML PO SYRP
5.0000 mL | ORAL_SOLUTION | Freq: Three times a day (TID) | ORAL | 0 refills | Status: AC | PRN
Start: 2024-05-04 — End: ?

## 2024-05-04 MED ORDER — AZITHROMYCIN 250 MG PO TABS: ORAL_TABLET | ORAL | 0 refills | Status: AC

## 2024-05-04 MED ORDER — CETIRIZINE HCL 10 MG PO TABS: 10.0000 mg | ORAL_TABLET | Freq: Every day | ORAL | 0 refills | Status: AC

## 2024-05-04 NOTE — ED Triage Notes (Signed)
 Pt reports right sided chest pain and lightheaded since he woke up today. Chest pain us  worse when taking deep breath.  sore throat and cough x 3 days. Motrin  and cold and flu meds gives no relief.

## 2024-05-04 NOTE — ED Notes (Signed)
 Pt requested COVID, Flu, Strep test.

## 2024-05-04 NOTE — ED Provider Notes (Signed)
 [facility]  Note:  This document was prepared using Conservation officer, historic buildings and may include unintentional dictation errors.  MRN: [id number] DOB: 1996-05-28  Subjective:   Joseph Barr is a 28 y.o. male presenting for 3-day history of persistent throat pain, coughing, chest pain worse with deep breath over the right side.  Today felt more lightheaded.  No history of asthma but has had bronchitis in the past.  Patient is a type I diabetic with poor control.  Patient used to be a smoker but does not smoke currently.  No history of emphysema, asthma, chronic bronchitis.  No current facility-administered medications for this encounter.  Current Outpatient Medications:    ibuprofen  (ADVIL ) 200 MG tablet, Take 200 mg by mouth every 6 (six) hours as needed., Disp: , Rfl:    Continuous Glucose Sensor (DEXCOM G7 SENSOR) MISC, Use to monitor blood glucose. Change sensor every 10 days., Disp: 9 each, Rfl: 1   Continuous Glucose Transmitter (DEXCOM G6 TRANSMITTER) MISC, Check blood glucose levels 6 times per day E11.65, Disp: 1 each, Rfl: 3   gabapentin  (NEURONTIN ) 100 MG capsule, Take 1 capsule (100 mg total) by mouth 3 (three) times daily., Disp: 90 capsule, Rfl: 3   Glucagon  (BAQSIMI  ONE PACK) 3 MG/DOSE POWD, Place 1 Device into the nose as needed (Low blood sugar with impaired consciousness)., Disp: 2 each, Rfl: 3   insulin  glargine (LANTUS ) 100 UNIT/ML Solostar Pen, Inject 18 Units into the skin 2 (two) times daily., Disp: 12 mL, Rfl: 5   insulin  lispro (HUMALOG  KWIKPEN) 100 UNIT/ML KwikPen, Inject 5 Units into the skin 3 (three) times daily after meals. For blood sugars 0-150 give 0 units of insulin , 151-200 give 2 units of insulin , 201-250 give 4 units, 251-300 give 6 units, 301-350 give 8 units, 351-400 give 10 units,> 400 give 12 units and call M.D. Discussed hypoglycemia protocol., Disp: 15 mL, Rfl: 11   Insulin  Pen Needle 31G X 5 MM MISC, Use to inject Humalog   insulin  with meals as directed and Lantus  twice a day as directed, Disp: 200 each, Rfl: 11   lisinopril  (ZESTRIL ) 2.5 MG tablet, Take 1 tablet (2.5 mg total) by mouth daily. To help protect kidneys, Disp: 90 tablet, Rfl: 3   No Known Allergies  Past Medical History:  Diagnosis Date   Diabetes mellitus      History reviewed. No pertinent surgical history.  Family History  Problem Relation Age of Onset   Cancer Maternal Grandfather    Hypertension Mother    Diabetes Neg Hx     Social History   Tobacco Use   Smoking status: Former    Types: E-cigarettes   Smokeless tobacco: Former  Building services engineer status: Former  Substance Use Topics   Alcohol use: Yes    Comment: Occa   Drug use: Not Currently    Types: Marijuana    ROS   Objective:   Vitals: BP 104/68 (BP Location: Right Arm)   Pulse (!) 113   Temp 98.8 F (37.1 C) (Oral)   Resp 16   SpO2 96%   Physical Exam Constitutional:      General: He is not in acute distress.    Appearance: Normal appearance. He is well-developed and normal weight. He is not ill-appearing, toxic-appearing or diaphoretic.  HENT:     Head: Normocephalic and atraumatic.     Right Ear: External ear normal.     Left Ear: External ear normal.  Nose: Nose normal.     Mouth/Throat:     Mouth: Mucous membranes are moist.     Pharynx: No pharyngeal swelling, oropharyngeal exudate, posterior oropharyngeal erythema or uvula swelling.     Tonsils: No tonsillar exudate or tonsillar abscesses. 0 on the right. 0 on the left.     Comments: Thick streaks of postnasal drainage overlying pharynx.  Eyes:     General: No scleral icterus.       Right eye: No discharge.        Left eye: No discharge.     Extraocular Movements: Extraocular movements intact.  Cardiovascular:     Rate and Rhythm: Normal rate and regular rhythm.     Heart sounds: Normal heart sounds. No murmur heard.    No friction rub. No gallop.  Pulmonary:     Effort:  Pulmonary effort is normal. No respiratory distress.     Breath sounds: No stridor. Decreased breath sounds present. No wheezing, rhonchi or rales.     Comments: Decreased lung sounds throughout. Musculoskeletal:     Cervical back: Normal range of motion.  Neurological:     Mental Status: He is alert and oriented to person, place, and time.  Psychiatric:        Mood and Affect: Mood normal.        Behavior: Behavior normal.        Thought Content: Thought content normal.        Judgment: Judgment normal.     Results for orders placed or performed during the hospital encounter of 05/04/24 (from the past 24 hours)  POCT rapid strep A     Status: None   Collection Time: 05/04/24  3:03 PM  Result Value Ref Range   Rapid Strep A Screen Negative Negative  POC Covid19/Flu A&B Antigen     Status: None   Collection Time: 05/04/24  3:11 PM  Result Value Ref Range   Influenza A Antigen, POC Negative Negative   Influenza B Antigen, POC Negative Negative   Covid Antigen, POC Negative Negative   DG Chest 2 View Result Date: 05/04/2024 CLINICAL DATA:  Cough, right-sided chest pain. EXAM: CHEST - 2 VIEW COMPARISON:  March 07, 2023. FINDINGS: The heart size and mediastinal contours are within normal limits. Both lungs are clear. The visualized skeletal structures are unremarkable. IMPRESSION: No active cardiopulmonary disease. Electronically Signed   By: Lynwood Landy Raddle M.D.   On: 05/04/2024 15:42    Assessment and Plan :   PDMP not reviewed this encounter.  1. Acute bronchitis, unspecified organism   2. Atypical chest pain   3. Acute cough   4. Uncontrolled type 1 diabetes mellitus with hyperglycemia (HCC)    Patient would benefit from steroids but unfortunately it is not feasible given his uncontrolled type 1 diabetes.  Recommend supportive care, azithromycin  for an acute bronchitis.  Counseled patient on potential for adverse effects with medications prescribed/recommended today, ER and  return-to-clinic precautions discussed, patient verbalized understanding.    Christopher Savannah, [place] 05/04/24 [phone]

## 2024-05-09 ENCOUNTER — Other Ambulatory Visit: Payer: Self-pay

## 2024-05-11 ENCOUNTER — Telehealth: Payer: Self-pay | Admitting: Nurse Practitioner

## 2024-05-11 NOTE — Telephone Encounter (Signed)
 pt unconfirmed appt 9/10 (per vr)

## 2024-05-13 ENCOUNTER — Ambulatory Visit: Admitting: Nurse Practitioner

## 2024-05-17 ENCOUNTER — Ambulatory Visit: Admitting: Nurse Practitioner

## 2024-05-30 ENCOUNTER — Other Ambulatory Visit: Payer: Self-pay

## 2024-05-30 ENCOUNTER — Other Ambulatory Visit: Payer: Self-pay | Admitting: Nurse Practitioner

## 2024-05-30 DIAGNOSIS — E1065 Type 1 diabetes mellitus with hyperglycemia: Secondary | ICD-10-CM

## 2024-06-01 ENCOUNTER — Other Ambulatory Visit: Payer: Self-pay | Admitting: Nurse Practitioner

## 2024-06-01 ENCOUNTER — Other Ambulatory Visit: Payer: Self-pay

## 2024-06-01 DIAGNOSIS — E1065 Type 1 diabetes mellitus with hyperglycemia: Secondary | ICD-10-CM

## 2024-06-02 ENCOUNTER — Other Ambulatory Visit: Payer: Self-pay | Admitting: Nurse Practitioner

## 2024-06-02 ENCOUNTER — Other Ambulatory Visit: Payer: Self-pay

## 2024-06-02 ENCOUNTER — Other Ambulatory Visit (HOSPITAL_COMMUNITY): Payer: Self-pay

## 2024-06-02 ENCOUNTER — Telehealth: Payer: Self-pay | Admitting: "Endocrinology

## 2024-06-02 DIAGNOSIS — E1065 Type 1 diabetes mellitus with hyperglycemia: Secondary | ICD-10-CM

## 2024-06-02 NOTE — Telephone Encounter (Addendum)
 MEDICATION: insulin  lispro (HUMALOG  KWIKPEN) 100 UNIT/ML KwikPen   PHARMACY:    [facility] (Ph: [phone])    HAS THE PATIENT CONTACTED THEIR PHARMACY?  Yes  LAST REFILL:  @@LASTREFILL @  IS THIS A 90 DAY SUPPLY : Yes  IS PATIENT OUT OF MEDICATION: No  IF NOT; HOW MUCH IS LEFT: 1/2 pen  LAST APPOINTMENT DATE: @1 /17/2025  NEXT APPOINTMENT DATE:@11 /20/2025  DO WE HAVE YOUR PERMISSION TO LEAVE A DETAILED MESSAGE?: Yes  OTHER COMMENTS:    **Let patient know to contact pharmacy at the end of the day to make sure medication is ready. **  ** Please notify patient to allow 48-72 hours to process**  **Encourage patient to contact the pharmacy for refills or they can request refills through [facility]**

## 2024-06-03 ENCOUNTER — Other Ambulatory Visit: Payer: Self-pay

## 2024-06-03 MED ORDER — DEXCOM G7 SENSOR MISC
1.0000 | 1 refills | Status: DC
  Filled 2024-06-03: qty 9, 90d supply, fill #0

## 2024-06-03 MED ORDER — DEXCOM G7 SENSOR MISC
1.0000 | 1 refills | Status: AC
  Filled 2024-06-03 – 2024-06-06 (×2): qty 3, 30d supply, fill #0
  Filled 2024-07-06: qty 3, 30d supply, fill #1
  Filled 2024-08-02: qty 3, 30d supply, fill #2
  Filled 2024-08-26: qty 3, 30d supply, fill #3
  Filled 2024-09-10 – 2024-09-23 (×2): qty 3, 30d supply, fill #0
  Filled ????-??-??: fill #0

## 2024-06-03 MED ORDER — INSULIN LISPRO (1 UNIT DIAL) 100 UNIT/ML (KWIKPEN)
PEN_INJECTOR | SUBCUTANEOUS | 11 refills | Status: AC
  Filled 2024-06-03: qty 15, 30d supply, fill #0
  Filled 2024-07-06: qty 15, 30d supply, fill #1
  Filled 2024-08-02: qty 15, 30d supply, fill #2
  Filled 2024-08-26 – 2024-09-22 (×2): qty 15, 30d supply, fill #3

## 2024-06-06 ENCOUNTER — Other Ambulatory Visit: Payer: Self-pay

## 2024-06-07 ENCOUNTER — Other Ambulatory Visit: Payer: Self-pay

## 2024-07-07 ENCOUNTER — Other Ambulatory Visit: Payer: Self-pay

## 2024-07-08 ENCOUNTER — Other Ambulatory Visit: Payer: Self-pay

## 2024-07-21 ENCOUNTER — Ambulatory Visit: Admitting: "Endocrinology

## 2024-08-02 ENCOUNTER — Other Ambulatory Visit: Payer: Self-pay

## 2024-08-02 ENCOUNTER — Other Ambulatory Visit: Payer: Self-pay | Admitting: "Endocrinology

## 2024-08-02 DIAGNOSIS — E1010 Type 1 diabetes mellitus with ketoacidosis without coma: Secondary | ICD-10-CM

## 2024-08-02 MED ORDER — LANTUS SOLOSTAR 100 UNIT/ML ~~LOC~~ SOPN
PEN_INJECTOR | SUBCUTANEOUS | 3 refills | Status: AC
  Filled 2024-08-02: qty 30, 88d supply, fill #0

## 2024-08-05 ENCOUNTER — Other Ambulatory Visit: Payer: Self-pay

## 2024-08-11 ENCOUNTER — Other Ambulatory Visit: Payer: Self-pay

## 2024-08-23 ENCOUNTER — Telehealth (INDEPENDENT_AMBULATORY_CARE_PROVIDER_SITE_OTHER): Payer: Self-pay | Admitting: "Endocrinology

## 2024-08-23 ENCOUNTER — Encounter: Payer: Self-pay | Admitting: "Endocrinology

## 2024-08-23 VITALS — Ht 72.0 in | Wt 141.0 lb

## 2024-08-23 DIAGNOSIS — E7800 Pure hypercholesterolemia, unspecified: Secondary | ICD-10-CM

## 2024-08-23 DIAGNOSIS — Z91148 Patient's other noncompliance with medication regimen for other reason: Secondary | ICD-10-CM

## 2024-08-23 DIAGNOSIS — E1065 Type 1 diabetes mellitus with hyperglycemia: Secondary | ICD-10-CM

## 2024-08-23 NOTE — Progress Notes (Signed)
 "  The patient reports they are currently: [place]. I spent 11 minutes on the vieo with the patient on the date of service. I spent an additional 10 minutes on pre- and post-visit activities on the date of service.   The patient was physically located in [place]  or a state in which I am permitted to provide care. The patient and/or parent/guardian understood that s/he may incur co-pays and cost sharing, and agreed to the telemedicine visit. The visit was reasonable and appropriate under the circumstances given the patient's presentation at the time.  The patient and/or parent/guardian has been advised of the potential risks and limitations of this mode of treatment (including, but not limited to, the absence of in-person examination) and has agreed to be treated using telemedicine. The patient's/patient's family's questions regarding telemedicine have been answered.   The patient and/or parent/guardian has also been advised to contact their provider's office for worsening conditions, and seek emergency medical treatment and/or call 911 if the patient deems either necessary.    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, No results found for: GFR C-peptide -ve, DM antibodies + Hba1c goal less than 7, current Hba1c is  Lab Results  Component Value Date   HGBA1C 11.1 (H) 08/14/2023   Will recommend the  following: Interested in pump, ordered DM education previously  Lantus  18 units bid Humalog  5 units with break fast, 7 units before lunch, and 9 units before dinner 15 min before meals Huamlog correction scale: Use in addition to your meal time/short acting insulin  based on blood sugars as follows: 151 - 175: 1 unit 176 - 200: 2 units 201 - 225: 3 units 226 - 250: 4 units 251 - 275: 5 units 276 - 300: 6 units 301 - 325: 7 units 326 - 350: 8 units 351 - 375: 9 units 376 - 400: 10 units   Patient is not able to take Humalog  due to his busy work. Discussed at length need for compliance. Patient would like beta bionic pump. Instructed diabetes educator to start the pump process-patient will run coverage discussion with his insurance. If not covered, prefer any other pump (CeQur as last resort).  Takes gabapentin  prn No known contraindications/side effects to any of above medications Glucagon  Baqsimi  discussed and prescribed with refills on 08/21/23    -Last LD and Tg are as follows: Lab Results  Component Value Date   LDLCALC 104 (H) 08/14/2023    Lab Results  Component Value Date   TRIG 157 (H) 08/14/2023   -not on statin  -Follow low fat diet and exercise   -Blood pressure goal <140/90 - Microalbumin/creatinine goal is < 30 -Last MA/Cr is as follows: Lab Results  Component Value Date   MICROALBUR 2.2 08/14/2023   -on ACE/ARB lisinopril  2.5 mg/day  -diet changes including salt restriction -limit eating outside -counseled BP targets per standards  of diabetes care -uncontrolled blood pressure can lead to retinopathy, nephropathy and cardiovascular and atherosclerotic heart disease  Reviewed and counseled on: -A1C target -Blood sugar targets -Complications of uncontrolled diabetes  -Checking blood sugar before meals and bedtime and bring log next visit -All medications with mechanism of action and side effects -Hypoglycemia management: rule of 15's, Glucagon  Emergency  Kit and medical alert ID -low-carb low-fat plate-method diet -At least 20 minutes of physical activity per day -Annual dilated retinal eye exam and foot exam -compliance and follow up needs -follow up as scheduled or earlier if problem gets worse  Call if blood sugar is less than 70 or consistently above 250    Take a 15 gm snack of carbohydrate at bedtime before you go to sleep if your blood sugar is less than 100.    If you are going to fast after midnight for a test or procedure, ask your physician for instructions on how to reduce/decrease your insulin  dose.    Call if blood sugar is less than 70 or consistently above 250  -Treating a low sugar by rule of 15  (15 gms of sugar every 15 min until sugar is more than 70) If you feel your sugar is low, test your sugar to be sure If your sugar is low (less than 70), then take 15 grams of a fast acting Carbohydrate (3-4 glucose tablets or glucose gel or 4 ounces of juice or regular soda) Recheck your sugar 15 min after treating low to make sure it is more than 70 If sugar is still less than 70, treat again with 15 grams of carbohydrate          Don't drive the hour of hypoglycemia  If unconscious/unable to eat or drink by mouth, use glucagon  injection or nasal spray baqsimi  and call 911. Can repeat again in 15 min if still unconscious.  Return in about 1 month (around 09/23/2024) for tele-visit, 8 am fasting labs this Fri or Mon (before end of year).   I have reviewed current medications, nurse's notes, allergies, vital signs, past medical and surgical history, family medical history, and social history for this encounter. Counseled patient on symptoms, examination findings, lab findings, imaging results, treatment decisions and monitoring and prognosis. The patient understood the recommendations and agrees with the treatment plan. All questions regarding treatment plan were fully answered.  Obadiah Birmingham, MD  08/23/2024  History of  Present Illness Joseph Barr is a 28 y.o. year old male who presents for follow up of Type I diabetes mellitus.  Joseph Barr was first diagnosed at age 12.  Diabetes education +  Home diabetes regimen: Lantus  18 units bid  Humalog  5 units 10-15 min before meals + SS 1:100>150   COMPLICATIONS -  MI/Stroke -  retinopathy +  neuropathy -  nephropathy  BLOOD SUGAR DATA CGM interpretation: At today's visit, we reviewed her CGM downloads. The full report is scanned in the media. Reviewing the CGM trends, BG are elevated all across the day in an up and down fashion, with 22% in target range.   Physical Exam  Ht 6' (1.829 m)   Wt 141 lb (64 kg)   BMI 19.12 kg/m    Constitutional: well developed, well nourished Head: normocephalic, atraumatic Eyes: sclera anicteric, no redness Neck: supple Lungs: normal respiratory effort Neurology: alert and oriented Skin: dry, no appreciable rashes Musculoskeletal: no appreciable defects Psychiatric: normal mood and affect Diabetic Foot Exam - Simple   No data  filed      Current Medications Patient's Medications  New Prescriptions   No medications on file  Previous Medications   AZITHROMYCIN  (ZITHROMAX ) 250 MG TABLET    Day 1: take 2 tablets. Day 2-5: Take 1 tablet daily.   CETIRIZINE  (ZYRTEC  ALLERGY) 10 MG TABLET    Take 1 tablet (10 mg total) by mouth daily.   CONTINUOUS GLUCOSE SENSOR (DEXCOM G7 SENSOR) MISC    Use to monitor blood glucose. Change sensor every 10 days.   CONTINUOUS GLUCOSE TRANSMITTER (DEXCOM G6 TRANSMITTER) MISC    Check blood glucose levels 6 times per day E11.65   GABAPENTIN  (NEURONTIN ) 100 MG CAPSULE    Take 1 capsule (100 mg total) by mouth 3 (three) times daily.   GLUCAGON  (BAQSIMI  ONE PACK) 3 MG/DOSE POWD    Place 1 Device into the nose as needed (Low blood sugar with impaired consciousness).   IBUPROFEN  (ADVIL ) 200 MG TABLET    Take 200 mg by mouth every 6 (six) hours as needed.   INSULIN  GLARGINE (LANTUS   SOLOSTAR) 100 UNIT/ML SOLOSTAR PEN    Inject 18 units into the skin every morning and 16 units at night   INSULIN  LISPRO (HUMALOG  KWIKPEN) 100 UNIT/ML KWIKPEN    Humalog  6 units + sliding scale. Increase by 1 unit everyday until 2 hour after meal glucose reaches 150   INSULIN  PEN NEEDLE 31G X 5 MM MISC    Use to inject Humalog  insulin  with meals as directed and Lantus  twice a day as directed   LISINOPRIL  (ZESTRIL ) 2.5 MG TABLET    Take 1 tablet (2.5 mg total) by mouth daily. To help protect kidneys   PROMETHAZINE -DEXTROMETHORPHAN  (PROMETHAZINE -DM) 6.25-15 MG/5ML SYRUP    Take 5 mLs by mouth 3 (three) times daily as needed for cough.  Modified Medications   No medications on file  Discontinued Medications   No medications on file    Allergies No Known Allergies  Past Medical History Past Medical History:  Diagnosis Date   Diabetes mellitus     Past Surgical History History reviewed. No pertinent surgical history.  Family History family history includes Cancer in his maternal grandfather; Hypertension in his mother.  Social History Social History   Socioeconomic History   Marital status: Single    Spouse name: Not on file   Number of children: Not on file   Years of education: Not on file   Highest education level: Associate degree: academic program  Occupational History   Not on file  Tobacco Use   Smoking status: Former    Types: E-cigarettes   Smokeless tobacco: Former  Building Services Engineer status: Former  Substance and Sexual Activity   Alcohol use: Yes    Comment: Occa   Drug use: Not Currently    Types: Marijuana   Sexual activity: Not Currently  Other Topics Concern   Not on file  Social History Narrative   Not on file   Social Drivers of Health   Tobacco Use: Medium Risk (08/23/2024)   Patient History    Smoking Tobacco Use: Former    Smokeless Tobacco Use: Former    Passive Exposure: Not on Actuary Strain: Medium Risk (04/10/2023)    Overall Financial Resource Strain (CARDIA)    Difficulty of Paying Living Expenses: Somewhat hard  Food Insecurity: No Food Insecurity (04/10/2023)   Hunger Vital Sign    Worried About Running Out of Food in the Last Year: Never true  Ran Out of Food in the Last Year: Never true  Transportation Needs: No Transportation Needs (04/10/2023)   PRAPARE - Administrator, Civil Service (Medical): No    Lack of Transportation (Non-Medical): No  Physical Activity: Sufficiently Active (04/10/2023)   Exercise Vital Sign    Days of Exercise per Week: 7 days    Minutes of Exercise per Session: 150+ min  Stress: No Stress Concern Present (04/10/2023)   Joseph Barr    Feeling of Stress : Not at all  Social Connections: Moderately Isolated (04/10/2023)   Social Connection and Isolation Panel    Frequency of Communication with Friends and Family: More than three times a week    Frequency of Social Gatherings with Friends and Family: More than three times a week    Attends Religious Services: 1 to 4 times per year    Active Member of Clubs or Organizations: No    Attends Banker Meetings: Not on file    Marital Status: Never married  Intimate Partner Violence: Not At Risk (03/08/2023)   Humiliation, Afraid, Rape, and Kick Barr    Fear of Current or Ex-Partner: No    Emotionally Abused: No    Physically Abused: No    Sexually Abused: No  Depression (PHQ2-9): Low Risk (04/10/2023)   Depression (PHQ2-9)    PHQ-2 Score: 0  Alcohol Screen: Not on file  Housing: Low Risk (04/10/2023)   Housing    Last Housing Risk Score: 0  Utilities: Not At Risk (03/08/2023)   [facility] Utilities    Threatened with loss of utilities: No  Health Literacy: Not on file    Lab Results  Component Value Date   HGBA1C 11.1 (H) 08/14/2023   HGBA1C >15.5 (H) 03/09/2023   HGBA1C >15.5 (H) 01/16/2023   Lab Results  Component Value Date    CHOL 187 08/14/2023   Lab Results  Component Value Date   HDL 58 08/14/2023   Lab Results  Component Value Date   LDLCALC 104 (H) 08/14/2023   Lab Results  Component Value Date   TRIG 157 (H) 08/14/2023   Lab Results  Component Value Date   CHOLHDL 3.2 08/14/2023   Lab Results  Component Value Date   CREATININE 1.12 08/14/2023   No results found for: GFR Lab Results  Component Value Date   MICROALBUR 2.2 08/14/2023      Component Value Date/Time   NA 135 08/14/2023 1056   NA 138 04/10/2023 1433   K 5.0 08/14/2023 1056   CL 96 (L) 08/14/2023 1056   CO2 30 08/14/2023 1056   GLUCOSE 486 (H) 08/14/2023 1056   BUN 14 08/14/2023 1056   BUN 14 04/10/2023 1433   CREATININE 1.12 08/14/2023 1056   CALCIUM  9.6 08/14/2023 1056   PROT 7.1 08/14/2023 1056   PROT 7.1 04/10/2023 1433   ALBUMIN 4.5 04/10/2023 1433   AST 14 08/14/2023 1056   ALT 11 08/14/2023 1056   ALKPHOS 61 04/10/2023 1433   BILITOT 0.5 08/14/2023 1056   BILITOT 0.3 04/10/2023 1433   GFRNONAA >60 03/09/2023 0313   GFRNONAA >89 07/03/2014 1038   GFRAA 141 12/01/2019 1113   GFRAA >89 07/03/2014 1038      Latest Ref Rng & Units 08/14/2023   10:56 AM 04/10/2023    2:33 PM 03/09/2023    3:13 AM  BMP  Glucose 65 - 99 mg/dL 513  762  840   BUN  7 - 25 mg/dL 14  14  9    Creatinine 0.60 - 1.24 mg/dL 8.87  9.09  9.26   BUN/Creat Ratio 6 - 22 (calc) SEE NOTE:  16    Sodium 135 - 146 mmol/L 135  138  135   Potassium 3.5 - 5.3 mmol/L 5.0  4.5  3.3   Chloride 98 - 110 mmol/L 96  99  100   CO2 20 - 32 mmol/L 30  26  27    Calcium  8.6 - 10.3 mg/dL 9.6  9.5  8.6        Component Value Date/Time   WBC 5.2 04/10/2023 1433   WBC 10.4 03/09/2023 0313   RBC 4.47 04/10/2023 1433   RBC 3.82 (L) 03/09/2023 0313   HGB 13.5 04/10/2023 1433   HCT 39.1 04/10/2023 1433   PLT 338 04/10/2023 1433   MCV 88 04/10/2023 1433   MCH 30.2 04/10/2023 1433   MCH 29.6 03/09/2023 0313   MCHC 34.5 04/10/2023 1433   MCHC 34.1  03/09/2023 0313   RDW 12.9 04/10/2023 1433   LYMPHSABS 1.8 04/10/2023 1433   MONOABS 1.8 (H) 03/08/2023 0016   EOSABS 0.2 04/10/2023 1433   BASOSABS 0.1 04/10/2023 1433     Parts of this note may have been dictated using voice recognition software. There may be variances in spelling and vocabulary which are unintentional. Not all errors are proofread. Please notify the dino if any discrepancies are noted or if the meaning of any statement is not clear.   "

## 2024-08-26 ENCOUNTER — Other Ambulatory Visit: Payer: Self-pay

## 2024-08-26 ENCOUNTER — Encounter: Payer: Self-pay | Admitting: "Endocrinology

## 2024-08-31 NOTE — Progress Notes (Signed)
 Called and left message on his answering machine to call to me to discuss insulin  pump therapy with the Beta Bionic pump.

## 2024-09-07 ENCOUNTER — Other Ambulatory Visit: Payer: Self-pay

## 2024-09-08 ENCOUNTER — Other Ambulatory Visit: Payer: Self-pay

## 2024-09-10 ENCOUNTER — Other Ambulatory Visit: Payer: Self-pay

## 2024-09-10 ENCOUNTER — Other Ambulatory Visit (HOSPITAL_COMMUNITY): Payer: Self-pay

## 2024-09-12 ENCOUNTER — Other Ambulatory Visit: Payer: Self-pay

## 2024-09-16 ENCOUNTER — Other Ambulatory Visit: Payer: Self-pay

## 2024-09-22 ENCOUNTER — Other Ambulatory Visit: Payer: Self-pay

## 2024-09-23 ENCOUNTER — Other Ambulatory Visit: Payer: Self-pay

## 2024-10-05 ENCOUNTER — Other Ambulatory Visit: Payer: Self-pay
